# Patient Record
Sex: Female | Born: 1965
Health system: Southern US, Academic
[De-identification: ages and names within clinical notes are randomized; demographics above are authoritative.]

## PROBLEM LIST (undated history)

## (undated) ENCOUNTER — Ambulatory Visit: Payer: MEDICARE | Attending: Gastroenterology | Primary: Gastroenterology

## (undated) ENCOUNTER — Encounter

## (undated) ENCOUNTER — Ambulatory Visit: Attending: Pharmacist | Primary: Pharmacist

## (undated) ENCOUNTER — Telehealth

## (undated) ENCOUNTER — Ambulatory Visit

## (undated) ENCOUNTER — Encounter: Attending: Internal Medicine | Primary: Internal Medicine

## (undated) ENCOUNTER — Encounter: Attending: Gastroenterology | Primary: Gastroenterology

## (undated) ENCOUNTER — Ambulatory Visit: Payer: MEDICARE | Attending: Internal Medicine | Primary: Internal Medicine

## (undated) ENCOUNTER — Telehealth: Attending: Internal Medicine | Primary: Internal Medicine

## (undated) ENCOUNTER — Ambulatory Visit: Attending: Family Medicine | Primary: Family Medicine

## (undated) ENCOUNTER — Ambulatory Visit
Attending: Student in an Organized Health Care Education/Training Program | Primary: Student in an Organized Health Care Education/Training Program

## (undated) ENCOUNTER — Ambulatory Visit: Payer: MEDICARE

## (undated) ENCOUNTER — Ambulatory Visit: Payer: Medicare (Managed Care) | Attending: Gastroenterology | Primary: Gastroenterology

## (undated) DIAGNOSIS — E785 Hyperlipidemia, unspecified: Secondary | ICD-10-CM

## (undated) DIAGNOSIS — E782 Mixed hyperlipidemia: Secondary | ICD-10-CM

## (undated) DIAGNOSIS — I10 Essential (primary) hypertension: Secondary | ICD-10-CM

## (undated) DIAGNOSIS — R001 Bradycardia, unspecified: Secondary | ICD-10-CM

## (undated) DIAGNOSIS — E119 Type 2 diabetes mellitus without complications: Secondary | ICD-10-CM

## (undated) DIAGNOSIS — K5090 Crohn's disease, unspecified, without complications: Secondary | ICD-10-CM

## (undated) DIAGNOSIS — M1990 Unspecified osteoarthritis, unspecified site: Secondary | ICD-10-CM

## (undated) HISTORY — PX: TONSILLECTOMY: SUR1361

## (undated) HISTORY — DX: Unspecified osteoarthritis, unspecified site: M19.90

## (undated) HISTORY — DX: Type 2 diabetes mellitus without complications: E11.9

## (undated) HISTORY — PX: APPENDECTOMY: SHX54

## (undated) HISTORY — DX: Mixed hyperlipidemia: E78.2

## (undated) HISTORY — DX: Hyperlipidemia, unspecified: E78.5

## (undated) HISTORY — DX: Essential (primary) hypertension: I10

## (undated) HISTORY — DX: Bradycardia, unspecified: R00.1

## (undated) HISTORY — PX: SMALL INTESTINE SURGERY: SHX150

## (undated) HISTORY — PX: CATARACT EXTRACTION: SUR2

## (undated) MED ORDER — EZETIMIBE 10 MG TABLET: Freq: Every day | ORAL | 0.00000 days

## (undated) MED ORDER — BIOTIN 1 MG TABLET: Freq: Every day | ORAL | 0 days

---
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
## 1998-02-13 ENCOUNTER — Inpatient Hospital Stay (HOSPITAL_COMMUNITY): Admission: AD | Admit: 1998-02-13 | Discharge: 1998-02-13 | Payer: Self-pay | Admitting: Obstetrics and Gynecology

## 1998-03-14 ENCOUNTER — Inpatient Hospital Stay (HOSPITAL_COMMUNITY): Admission: AD | Admit: 1998-03-14 | Discharge: 1998-03-14 | Payer: Self-pay | Admitting: Obstetrics and Gynecology

## 1998-03-23 ENCOUNTER — Other Ambulatory Visit: Admission: RE | Admit: 1998-03-23 | Discharge: 1998-03-23 | Payer: Self-pay | Admitting: Obstetrics and Gynecology

## 1998-04-11 ENCOUNTER — Inpatient Hospital Stay (HOSPITAL_COMMUNITY): Admission: AD | Admit: 1998-04-11 | Discharge: 1998-04-11 | Payer: Self-pay | Admitting: Obstetrics and Gynecology

## 1998-05-09 ENCOUNTER — Encounter (HOSPITAL_COMMUNITY): Admission: RE | Admit: 1998-05-09 | Discharge: 1998-08-07 | Payer: Self-pay | Admitting: Obstetrics and Gynecology

## 1999-04-17 ENCOUNTER — Other Ambulatory Visit: Admission: RE | Admit: 1999-04-17 | Discharge: 1999-04-17 | Payer: Self-pay | Admitting: Obstetrics and Gynecology

## 2000-04-29 ENCOUNTER — Other Ambulatory Visit: Admission: RE | Admit: 2000-04-29 | Discharge: 2000-04-29 | Payer: Self-pay | Admitting: Obstetrics and Gynecology

## 2000-05-13 HISTORY — PX: ANAL FISSURE REPAIR: SHX2312

## 2001-05-21 ENCOUNTER — Other Ambulatory Visit: Admission: RE | Admit: 2001-05-21 | Discharge: 2001-05-21 | Payer: Self-pay | Admitting: Obstetrics and Gynecology

## 2001-11-24 ENCOUNTER — Other Ambulatory Visit: Admission: RE | Admit: 2001-11-24 | Discharge: 2001-11-24 | Payer: Self-pay | Admitting: Obstetrics and Gynecology

## 2002-06-01 ENCOUNTER — Other Ambulatory Visit: Admission: RE | Admit: 2002-06-01 | Discharge: 2002-06-01 | Payer: Self-pay | Admitting: Obstetrics and Gynecology

## 2003-06-08 ENCOUNTER — Other Ambulatory Visit: Admission: RE | Admit: 2003-06-08 | Discharge: 2003-06-08 | Payer: Self-pay | Admitting: Obstetrics and Gynecology

## 2004-05-17 ENCOUNTER — Other Ambulatory Visit: Admission: RE | Admit: 2004-05-17 | Discharge: 2004-05-17 | Payer: Self-pay | Admitting: Obstetrics and Gynecology

## 2005-05-17 ENCOUNTER — Other Ambulatory Visit: Admission: RE | Admit: 2005-05-17 | Discharge: 2005-05-17 | Payer: Self-pay | Admitting: Obstetrics and Gynecology

## 2011-04-30 ENCOUNTER — Other Ambulatory Visit: Payer: Self-pay | Admitting: Obstetrics and Gynecology

## 2011-04-30 DIAGNOSIS — O26899 Other specified pregnancy related conditions, unspecified trimester: Secondary | ICD-10-CM

## 2011-04-30 DIAGNOSIS — IMO0002 Reserved for concepts with insufficient information to code with codable children: Secondary | ICD-10-CM

## 2011-05-01 ENCOUNTER — Ambulatory Visit
Admission: RE | Admit: 2011-05-01 | Discharge: 2011-05-01 | Disposition: A | Discharge status: Home / Self Care | Payer: BC Managed Care – PPO | Source: Ambulatory Visit | Attending: Obstetrics and Gynecology | Admitting: Obstetrics and Gynecology

## 2011-05-01 DIAGNOSIS — IMO0002 Reserved for concepts with insufficient information to code with codable children: Secondary | ICD-10-CM

## 2011-05-01 DIAGNOSIS — O26899 Other specified pregnancy related conditions, unspecified trimester: Secondary | ICD-10-CM

## 2011-05-01 MED ORDER — IOHEXOL 300 MG/ML  SOLN
125.0000 mL | Freq: Once | INTRAMUSCULAR | Status: AC | PRN
  Administered 2011-05-01: 125 mL via INTRAVENOUS

## 2012-10-13 ENCOUNTER — Encounter (HOSPITAL_COMMUNITY): Payer: Self-pay | Admitting: Pharmacist

## 2012-10-14 ENCOUNTER — Encounter (HOSPITAL_COMMUNITY): Payer: Self-pay | Admitting: *Deleted

## 2012-10-15 ENCOUNTER — Other Ambulatory Visit: Payer: Self-pay | Admitting: Obstetrics and Gynecology

## 2012-10-15 ENCOUNTER — Encounter (HOSPITAL_COMMUNITY)
Admission: RE | Admit: 2012-10-15 | Discharge: 2012-10-15 | Disposition: A | Discharge status: Home / Self Care | Payer: Medicare Other | Source: Ambulatory Visit | Attending: Obstetrics and Gynecology | Admitting: Obstetrics and Gynecology

## 2012-10-15 ENCOUNTER — Encounter (HOSPITAL_COMMUNITY): Payer: Self-pay

## 2012-10-15 ENCOUNTER — Other Ambulatory Visit: Payer: Self-pay

## 2012-10-15 DIAGNOSIS — Z01818 Encounter for other preprocedural examination: Secondary | ICD-10-CM | POA: Insufficient documentation

## 2012-10-15 DIAGNOSIS — Z01812 Encounter for preprocedural laboratory examination: Secondary | ICD-10-CM | POA: Insufficient documentation

## 2012-10-15 LAB — SURGICAL PCR SCREEN
MRSA, PCR: NEGATIVE
Staphylococcus aureus: NEGATIVE

## 2012-10-15 LAB — BASIC METABOLIC PANEL
GFR calc Af Amer: >90 mL/min (ref >90)
GFR calc non Af Amer: >90 mL/min (ref >90)
Glucose, Bld: 131 mg/dL — ABNORMAL HIGH (ref 70–99)
Potassium: 3.7 mEq/L (ref 3.5–5.1)
Sodium: 138 mEq/L (ref 135–145)

## 2012-10-15 LAB — CBC
Hemoglobin: 13.3 g/dL (ref 12.0–15.0)
MCHC: 31.9 g/dL (ref 30.0–36.0)
RDW: 14.4 % (ref 11.5–15.5)
WBC: 7.2 10*3/uL (ref 4.0–10.5)

## 2012-10-15 NOTE — Patient Instructions (Addendum)
Your procedure is scheduled on:10/20/12  Enter through the Main Entrance at :0830 am Pick up desk phone and dial [phone] and inform us of your arrival.  Please call [phone] if you have any problems the morning of surgery.  Remember: Do not eat or drink after midnight: Monday  ( water ok until 6am on Tuesday)   You may brush your teeth the morning of surgery.   DO NOT wear jewelry, eye make-up, lipstick,body lotion, or dark fingernail polish.  (Polished toes are ok)   If you are to be admitted after surgery, leave suitcase in car until your room has been assigned. Patients discharged on the day of surgery will not be allowed to drive home. Wear loose fitting, comfortable clothes for your ride home.

## 2012-10-15 NOTE — Pre-Procedure Instructions (Signed)
Small veins in both hands- pt inst to drink water until 6am on day of surgery. Antecubital veins are sufficient and lab draw was successful on 1st try.

## 2012-10-16 ENCOUNTER — Other Ambulatory Visit: Payer: Self-pay | Admitting: Obstetrics and Gynecology

## 2012-10-19 MED ORDER — DEXTROSE 5 % IV SOLN
INTRAVENOUS | Status: AC
  Administered 2012-10-20: 100 mL via INTRAVENOUS
  Filled 2012-10-19: qty 10

## 2012-10-19 NOTE — H&P (Signed)
Brittany [name] is an 47 y.o. female with a history of menorrhagia, uterine prolapse, dysmenorrhea and pelvic pain. She reports increasing pelvic pressure and pain and reports that her last period was almost intolerable due to pain and bleeding. She now requests that a definitive procedure be carried out to resolve these issues with her menses and prolapse and is being admitted for a total abdominal hysterectomy and BSO. The abdominal approach is being taken to avoid the Crohn's disease which has involved to lower rectovaginal septum. Recent endometrial biopsy revealed benign endometrium with partial polyp. Last pap was within normal limits  Pertinent Gynecological History: Menses: flow is excessive with use of 10 pads or tampons on heaviest days Bleeding: dysfunctional uterine bleeding Last mammogram: normal Date: 09/09/2012 Last pap: normal Date: 09/09/2012    Menstrual History:  Patient's last menstrual period was 09/23/2012.    Past Medical History  Diagnosis Date  . Hypertension   . Crohn's disease     Past Surgical History  Procedure Laterality Date  . Appendectomy    . Tonsillectomy    . Anal fissure repair  2002  . Small intestine surgery      reconstruction    History reviewed. No pertinent family history.  Social History:  reports that she has quit smoking. She does not have any smokeless tobacco history on file. She reports that she does not drink alcohol or use illicit drugs.  Allergies:  Allergies  Allergen Reactions  . Mercaptopurine     Used to treat pt. Crohn's Disease.  Caused Pancreatis 1992.  Marland Kitchen Morphine And Related Itching  . Penicillins Rash    No prescriptions prior to admission    Review of Systems  Constitutional: Negative.  Negative for fever, chills and weight loss.  Respiratory: Negative for cough and hemoptysis.   Cardiovascular: Negative for chest pain, palpitations and orthopnea.  Gastrointestinal: Positive for abdominal pain. Negative for  heartburn, nausea and vomiting.  Genitourinary: Negative for dysuria, urgency, frequency and hematuria.  Gyn: No discharge or itch. Positive pressure, last menses was extremely heavy with large clots. Last menstrual period 09/23/2012.   Physical Exam  Constitutional: She is oriented to person, place, and time. She appears well-developed and well-nourished.  HENT:  Head: Normocephalic and atraumatic.  Eyes: Conjunctivae are normal. Pupils are equal, round, and reactive to light.  Neck: Normal range of motion. Neck supple. No tracheal deviation present. No thyromegaly present.  Cardiovascular: Normal rate, regular rhythm and normal heart sounds.  Exam reveals no gallop.   No murmur heard. Respiratory: Effort normal and breath sounds normal.  GI: Bowel sounds are normal. She exhibits no distension and no mass. There is no tenderness. There is no rebound and no guarding.  Neurological: She is alert and oriented to person, place, and time.  Skin: Skin is warm and dry.  Psychiatric: She has a normal mood and affect. Her behavior is normal. Thought content normal.   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 Remainder of vagina is normal and without lesion   Cervix: No tender and without lesion   Uterus: Anterior, normal size and shape, mobile   Adnexa: No masses noted, non tender  No results found for this or any previous visit (from the past 24 hour(s)).  No results found.  Assessment: Menorrhagia, dysmenorrhea,  pelvic pain, endometrial polyp in patient with Crohn's disease previously noted in lower rectovaginal septum  Plan: Total abdominal hysterectomy and bilateral salpingo oophorectomy. Abdominal approach being taken due to known Crohn's disease in rectovaginal septum. Patient has been bowel  prepped should any bowel resection be needed due to her history and location of Crohn's disease. The risks and benefits have been discussed and informed consent has been obtained   Leolia Vinzant 10/19/2012, 1:22 PM

## 2012-10-20 ENCOUNTER — Inpatient Hospital Stay (HOSPITAL_COMMUNITY)
Admission: RE | Admit: 2012-10-20 | Discharge: 2012-10-22 | DRG: 742 | Disposition: A | Discharge status: Home / Self Care | Payer: Medicare Other | Source: Ambulatory Visit | Attending: Obstetrics and Gynecology | Admitting: Obstetrics and Gynecology

## 2012-10-20 ENCOUNTER — Encounter (HOSPITAL_COMMUNITY): Payer: Self-pay | Admitting: Anesthesiology

## 2012-10-20 ENCOUNTER — Encounter (HOSPITAL_COMMUNITY)
Admission: RE | Disposition: A | Discharge status: Home / Self Care | Payer: Self-pay | Source: Ambulatory Visit | Attending: Obstetrics and Gynecology

## 2012-10-20 ENCOUNTER — Inpatient Hospital Stay (HOSPITAL_COMMUNITY): Payer: Medicare Other | Admitting: Anesthesiology

## 2012-10-20 DIAGNOSIS — N946 Dysmenorrhea, unspecified: Secondary | ICD-10-CM | POA: Diagnosis present

## 2012-10-20 DIAGNOSIS — N8000 Endometriosis of the uterus, unspecified: Secondary | ICD-10-CM | POA: Diagnosis present

## 2012-10-20 DIAGNOSIS — N840 Polyp of corpus uteri: Secondary | ICD-10-CM | POA: Diagnosis present

## 2012-10-20 DIAGNOSIS — N831 Corpus luteum cyst of ovary, unspecified side: Secondary | ICD-10-CM | POA: Diagnosis present

## 2012-10-20 DIAGNOSIS — N838 Other noninflammatory disorders of ovary, fallopian tube and broad ligament: Secondary | ICD-10-CM | POA: Diagnosis present

## 2012-10-20 DIAGNOSIS — N949 Unspecified condition associated with female genital organs and menstrual cycle: Secondary | ICD-10-CM | POA: Diagnosis present

## 2012-10-20 DIAGNOSIS — N920 Excessive and frequent menstruation with regular cycle: Principal | ICD-10-CM | POA: Diagnosis present

## 2012-10-20 DIAGNOSIS — K5090 Crohn's disease, unspecified, without complications: Secondary | ICD-10-CM | POA: Diagnosis present

## 2012-10-20 HISTORY — DX: Essential (primary) hypertension: I10

## 2012-10-20 HISTORY — PX: SALPINGOOPHORECTOMY: SHX82

## 2012-10-20 HISTORY — PX: ABDOMINAL HYSTERECTOMY: SHX81

## 2012-10-20 HISTORY — DX: Crohn's disease, unspecified, without complications: K50.90

## 2012-10-20 LAB — BASIC METABOLIC PANEL
BUN: 9 mg/dL (ref 6–23)
CO2: 27 mEq/L (ref 19–32)
Calcium: 9.6 mg/dL (ref 8.4–10.5)
GFR calc non Af Amer: >90 mL/min (ref >90)
Glucose, Bld: 99 mg/dL (ref 70–99)
Potassium: 3.7 mEq/L (ref 3.5–5.1)

## 2012-10-20 SURGERY — HYSTERECTOMY, ABDOMINAL
Anesthesia: Epidural | Site: Abdomen | Wound class: Clean

## 2012-10-20 MED ORDER — PROPOFOL 10 MG/ML IV BOLUS
INTRAVENOUS | Status: DC | PRN
  Administered 2012-10-20: 170 mg via INTRAVENOUS
  Administered 2012-10-20: 220 mg via INTRAVENOUS

## 2012-10-20 MED ORDER — LACTATED RINGERS IV SOLN
INTRAVENOUS | Status: DC
  Administered 2012-10-20 – 2012-10-21 (×4): via INTRAVENOUS

## 2012-10-20 MED ORDER — SODIUM BICARBONATE 8.4 % IV SOLN
INTRAVENOUS | Status: AC
  Filled 2012-10-20: qty 50

## 2012-10-20 MED ORDER — SCOPOLAMINE 1 MG/3DAYS TD PT72
1.0000 | MEDICATED_PATCH | Freq: Once | TRANSDERMAL | Status: DC
  Filled 2012-10-20 (×2): qty 1

## 2012-10-20 MED ORDER — MEPERIDINE HCL 25 MG/ML IJ SOLN: 6.2500 mg | INTRAMUSCULAR | Status: DC | PRN

## 2012-10-20 MED ORDER — MIDAZOLAM HCL 2 MG/2ML IJ SOLN
INTRAMUSCULAR | Status: AC
  Filled 2012-10-20: qty 2

## 2012-10-20 MED ORDER — ROCURONIUM BROMIDE 50 MG/5ML IV SOLN
INTRAVENOUS | Status: AC
  Filled 2012-10-20: qty 1

## 2012-10-20 MED ORDER — METOCLOPRAMIDE HCL 5 MG/ML IJ SOLN: 10.0000 mg | Freq: Three times a day (TID) | INTRAMUSCULAR | Status: DC | PRN

## 2012-10-20 MED ORDER — PROMETHAZINE HCL 25 MG/ML IJ SOLN
INTRAMUSCULAR | Status: AC
  Administered 2012-10-20: 12.5 mg via INTRAVENOUS
  Filled 2012-10-20: qty 1

## 2012-10-20 MED ORDER — ACETAMINOPHEN 10 MG/ML IV SOLN
1000.0000 mg | Freq: Four times a day (QID) | INTRAVENOUS | Status: AC | PRN
  Filled 2012-10-20: qty 100

## 2012-10-20 MED ORDER — MIDAZOLAM HCL 2 MG/2ML IJ SOLN: 0.5000 mg | Freq: Once | INTRAMUSCULAR | Status: DC | PRN

## 2012-10-20 MED ORDER — KETOROLAC TROMETHAMINE 30 MG/ML IJ SOLN: 15.0000 mg | Freq: Once | INTRAMUSCULAR | Status: AC | PRN

## 2012-10-20 MED ORDER — LACTATED RINGERS IV BOLUS (SEPSIS): 500.0000 mL | Freq: Once | INTRAVENOUS | Status: DC

## 2012-10-20 MED ORDER — NEOSTIGMINE METHYLSULFATE 1 MG/ML IJ SOLN
INTRAMUSCULAR | Status: DC | PRN
  Administered 2012-10-20: 4 mg via INTRAVENOUS

## 2012-10-20 MED ORDER — NALOXONE HCL 0.4 MG/ML IJ SOLN: 0.4000 mg | INTRAMUSCULAR | Status: DC | PRN

## 2012-10-20 MED ORDER — HYDROMORPHONE 0.3 MG/ML IV SOLN
INTRAVENOUS | Status: DC
  Administered 2012-10-20: 3 mg via INTRAVENOUS
  Administered 2012-10-20: 16:00:00 via INTRAVENOUS
  Administered 2012-10-21: 2.1 mg via INTRAVENOUS
  Administered 2012-10-21: 10:00:00 via INTRAVENOUS
  Administered 2012-10-21: 4.9 mL via INTRAVENOUS
  Administered 2012-10-21: 0.3 mg via INTRAVENOUS
  Filled 2012-10-20 (×2): qty 25

## 2012-10-20 MED ORDER — KETOROLAC TROMETHAMINE 30 MG/ML IJ SOLN
INTRAMUSCULAR | Status: AC
  Administered 2012-10-20: 30 mg via INTRAVENOUS
  Filled 2012-10-20: qty 1

## 2012-10-20 MED ORDER — ROCURONIUM BROMIDE 100 MG/10ML IV SOLN
INTRAVENOUS | Status: DC | PRN
  Administered 2012-10-20: 30 mg via INTRAVENOUS
  Administered 2012-10-20: 10 mg via INTRAVENOUS

## 2012-10-20 MED ORDER — EPHEDRINE SULFATE 50 MG/ML IJ SOLN
INTRAMUSCULAR | Status: AC
  Administered 2012-10-20: 10 mg
  Filled 2012-10-20: qty 1

## 2012-10-20 MED ORDER — PROMETHAZINE HCL 25 MG/ML IJ SOLN
12.5000 mg | Freq: Four times a day (QID) | INTRAMUSCULAR | Status: DC | PRN
  Filled 2012-10-20: qty 1

## 2012-10-20 MED ORDER — KETOROLAC TROMETHAMINE 30 MG/ML IJ SOLN
30.0000 mg | Freq: Four times a day (QID) | INTRAMUSCULAR | Status: DC | PRN
  Administered 2012-10-20 – 2012-10-21 (×2): 30 mg via INTRAVENOUS
  Filled 2012-10-20 (×2): qty 1

## 2012-10-20 MED ORDER — 0.9 % SODIUM CHLORIDE (POUR BTL) OPTIME
TOPICAL | Status: DC | PRN
  Administered 2012-10-20 (×3): 1000 mL

## 2012-10-20 MED ORDER — EPHEDRINE SULFATE 50 MG/ML IJ SOLN: 5.0000 mg | Freq: Once | INTRAMUSCULAR | Status: DC

## 2012-10-20 MED ORDER — PROPOFOL 10 MG/ML IV EMUL
INTRAVENOUS | Status: AC
  Filled 2012-10-20: qty 20

## 2012-10-20 MED ORDER — LIDOCAINE-EPINEPHRINE (PF) 2 %-1:200000 IJ SOLN
INTRAMUSCULAR | Status: AC
  Filled 2012-10-20: qty 20

## 2012-10-20 MED ORDER — ETOMIDATE 2 MG/ML IV SOLN
INTRAVENOUS | Status: AC
  Filled 2012-10-20: qty 10

## 2012-10-20 MED ORDER — LIDOCAINE HCL (CARDIAC) 20 MG/ML IV SOLN
INTRAVENOUS | Status: AC
  Filled 2012-10-20: qty 5

## 2012-10-20 MED ORDER — DIPHENHYDRAMINE HCL 12.5 MG/5ML PO ELIX: 12.5000 mg | ORAL_SOLUTION | Freq: Four times a day (QID) | ORAL | Status: DC | PRN

## 2012-10-20 MED ORDER — FENTANYL CITRATE 0.05 MG/ML IJ SOLN
INTRAMUSCULAR | Status: AC
  Filled 2012-10-20: qty 5

## 2012-10-20 MED ORDER — SODIUM CHLORIDE 0.9 % IJ SOLN: 9.0000 mL | INTRAMUSCULAR | Status: DC | PRN

## 2012-10-20 MED ORDER — PROMETHAZINE HCL 25 MG/ML IJ SOLN: 6.2500 mg | INTRAMUSCULAR | Status: DC | PRN

## 2012-10-20 MED ORDER — ONDANSETRON HCL 4 MG/2ML IJ SOLN
INTRAMUSCULAR | Status: AC
  Filled 2012-10-20: qty 2

## 2012-10-20 MED ORDER — PHENYLEPHRINE 40 MCG/ML (10ML) SYRINGE FOR IV PUSH (FOR BLOOD PRESSURE SUPPORT)
PREFILLED_SYRINGE | INTRAVENOUS | Status: AC
  Filled 2012-10-20: qty 5

## 2012-10-20 MED ORDER — FENTANYL CITRATE 0.05 MG/ML IJ SOLN
INTRAMUSCULAR | Status: AC
  Administered 2012-10-20: 50 ug via INTRAVENOUS
  Filled 2012-10-20: qty 2

## 2012-10-20 MED ORDER — MIDAZOLAM HCL 5 MG/5ML IJ SOLN
INTRAMUSCULAR | Status: DC | PRN
  Administered 2012-10-20 (×4): 1 mg via INTRAVENOUS

## 2012-10-20 MED ORDER — KETOROLAC TROMETHAMINE 30 MG/ML IJ SOLN: 30.0000 mg | Freq: Four times a day (QID) | INTRAMUSCULAR | Status: DC | PRN

## 2012-10-20 MED ORDER — SODIUM CHLORIDE 0.9 % IV SOLN
INTRAVENOUS | Status: DC
  Filled 2012-10-20 (×6): qty 20

## 2012-10-20 MED ORDER — DIPHENHYDRAMINE HCL 25 MG PO CAPS: 25.0000 mg | ORAL_CAPSULE | ORAL | Status: DC | PRN

## 2012-10-20 MED ORDER — DIPHENHYDRAMINE HCL 50 MG/ML IJ SOLN: 12.5000 mg | INTRAMUSCULAR | Status: DC | PRN

## 2012-10-20 MED ORDER — ONDANSETRON HCL 4 MG/2ML IJ SOLN: 4.0000 mg | Freq: Four times a day (QID) | INTRAMUSCULAR | Status: DC | PRN

## 2012-10-20 MED ORDER — SUCCINYLCHOLINE CHLORIDE 20 MG/ML IJ SOLN
INTRAMUSCULAR | Status: DC | PRN
  Administered 2012-10-20: 120 mg via INTRAVENOUS

## 2012-10-20 MED ORDER — HYDROMORPHONE HCL PF 1 MG/ML IJ SOLN: 1.0000 mg | Freq: Once | INTRAMUSCULAR | Status: AC

## 2012-10-20 MED ORDER — ONDANSETRON HCL 4 MG/2ML IJ SOLN: 4.0000 mg | Freq: Three times a day (TID) | INTRAMUSCULAR | Status: DC | PRN

## 2012-10-20 MED ORDER — SODIUM CHLORIDE 0.9 % IV SOLN
Freq: Once | INTRAVENOUS | Status: DC
  Filled 2012-10-20 (×2): qty 20

## 2012-10-20 MED ORDER — ONDANSETRON HCL 4 MG/2ML IJ SOLN
INTRAMUSCULAR | Status: DC | PRN
  Administered 2012-10-20: 4 mg via INTRAVENOUS

## 2012-10-20 MED ORDER — NALBUPHINE SYRINGE 5 MG/0.5 ML
5.0000 mg | INJECTION | INTRAMUSCULAR | Status: DC | PRN
  Filled 2012-10-20: qty 1

## 2012-10-20 MED ORDER — LIDOCAINE HCL (CARDIAC) 20 MG/ML IV SOLN
INTRAVENOUS | Status: DC | PRN
  Administered 2012-10-20 (×2): 30 mg via INTRAVENOUS
  Administered 2012-10-20: 40 mg via INTRAVENOUS

## 2012-10-20 MED ORDER — DIPHENHYDRAMINE HCL 50 MG/ML IJ SOLN: 25.0000 mg | INTRAMUSCULAR | Status: DC | PRN

## 2012-10-20 MED ORDER — NALOXONE HCL 1 MG/ML IJ SOLN
1.0000 ug/kg/h | INTRAMUSCULAR | Status: DC | PRN
  Filled 2012-10-20: qty 2

## 2012-10-20 MED ORDER — GLYCOPYRROLATE 0.2 MG/ML IJ SOLN
INTRAMUSCULAR | Status: AC
  Filled 2012-10-20: qty 2

## 2012-10-20 MED ORDER — HYDROMORPHONE HCL PF 1 MG/ML IJ SOLN
INTRAMUSCULAR | Status: AC
  Administered 2012-10-20: 0.5 mg via INTRAVENOUS
  Filled 2012-10-20: qty 1

## 2012-10-20 MED ORDER — PHENYLEPHRINE HCL 10 MG/ML IJ SOLN
INTRAMUSCULAR | Status: DC | PRN
  Administered 2012-10-20 (×2): 80 ug via INTRAVENOUS
  Administered 2012-10-20: 40 ug via INTRAVENOUS
  Administered 2012-10-20 (×5): 80 ug via INTRAVENOUS

## 2012-10-20 MED ORDER — FENTANYL CITRATE 0.05 MG/ML IJ SOLN
INTRAMUSCULAR | Status: AC
Start: 2012-10-20 — End: 2012-10-20
  Filled 2012-10-20: qty 2

## 2012-10-20 MED ORDER — GLYCOPYRROLATE 0.2 MG/ML IJ SOLN
INTRAMUSCULAR | Status: DC | PRN
  Administered 2012-10-20: 0.6 mg via INTRAVENOUS

## 2012-10-20 MED ORDER — DIPHENHYDRAMINE HCL 50 MG/ML IJ SOLN: 12.5000 mg | Freq: Four times a day (QID) | INTRAMUSCULAR | Status: DC | PRN

## 2012-10-20 MED ORDER — FENTANYL CITRATE 0.05 MG/ML IJ SOLN
25.0000 ug | INTRAMUSCULAR | Status: DC | PRN
  Administered 2012-10-20: 50 ug via INTRAVENOUS
  Administered 2012-10-20 (×2): 25 ug via INTRAVENOUS

## 2012-10-20 MED ORDER — LIDOCAINE-EPINEPHRINE (PF) 2 %-1:200000 IJ SOLN
INTRAMUSCULAR | Status: DC | PRN
  Administered 2012-10-20: 3 mL via EPIDURAL

## 2012-10-20 MED ORDER — IBUPROFEN 600 MG PO TABS
600.0000 mg | ORAL_TABLET | Freq: Four times a day (QID) | ORAL | Status: DC | PRN
  Administered 2012-10-22: 600 mg via ORAL
  Filled 2012-10-20: qty 1

## 2012-10-20 MED ORDER — MENTHOL 3 MG MT LOZG: 1.0000 | LOZENGE | OROMUCOSAL | Status: DC | PRN

## 2012-10-20 MED ORDER — BUPIVACAINE-EPINEPHRINE PF 0.25-1:200000 % IJ SOLN
INTRAMUSCULAR | Status: AC
  Filled 2012-10-20: qty 30

## 2012-10-20 MED ORDER — LIDOCAINE HCL (PF) 1 % IJ SOLN
INTRAMUSCULAR | Status: DC | PRN
  Administered 2012-10-20: 8 mL

## 2012-10-20 MED ORDER — LACTATED RINGERS IV SOLN
INTRAVENOUS | Status: DC
  Administered 2012-10-20 (×3): via INTRAVENOUS

## 2012-10-20 MED ORDER — SODIUM CHLORIDE 0.9 % IJ SOLN
3.0000 mL | INTRAMUSCULAR | Status: DC | PRN
  Administered 2012-10-21: 3 mL via INTRAVENOUS

## 2012-10-20 MED ORDER — FENTANYL CITRATE 0.05 MG/ML IJ SOLN
INTRAMUSCULAR | Status: DC | PRN
  Administered 2012-10-20: 100 ug via INTRAVENOUS

## 2012-10-20 SURGICAL SUPPLY — 39 items
APL SKNCLS STERI-STRIP NONHPOA (GAUZE/BANDAGES/DRESSINGS)
BENZOIN TINCTURE PRP APPL 2/3 (GAUZE/BANDAGES/DRESSINGS) IMPLANT
CANISTER SUCTION 2500CC (MISCELLANEOUS) ×3 IMPLANT
CLOTH BEACON ORANGE TIMEOUT ST (SAFETY) ×3 IMPLANT
CONT PATH 16OZ SNAP LID 3702 (MISCELLANEOUS) ×3 IMPLANT
DECANTER SPIKE VIAL GLASS SM (MISCELLANEOUS) IMPLANT
DRAPE LAPAROTOMY T 102X78X121 (DRAPES) ×3 IMPLANT
DRSG OPSITE POSTOP 4X10 (GAUZE/BANDAGES/DRESSINGS) ×3 IMPLANT
GLOVE BIO SURGEON STRL SZ7.5 (GLOVE) ×3 IMPLANT
GLOVE BIOGEL PI IND STRL 7.0 (GLOVE) ×2 IMPLANT
GLOVE BIOGEL PI IND STRL 7.5 (GLOVE) ×2 IMPLANT
GLOVE BIOGEL PI INDICATOR 7.0 (GLOVE) ×1
GLOVE BIOGEL PI INDICATOR 7.5 (GLOVE) ×1
GLOVE ECLIPSE 6.5 STRL STRAW (GLOVE) ×3 IMPLANT
GLOVE ECLIPSE 7.5 STRL STRAW (GLOVE) ×3 IMPLANT
GOWN PREVENTION PLUS LG XLONG (DISPOSABLE) ×6 IMPLANT
GOWN PREVENTION PLUS XXLARGE (GOWN DISPOSABLE) ×3 IMPLANT
NS IRRIG 1000ML POUR BTL (IV SOLUTION) ×3 IMPLANT
PACK ABDOMINAL GYN (CUSTOM PROCEDURE TRAY) ×3 IMPLANT
PAD OB MATERNITY 4.3X12.25 (PERSONAL CARE ITEMS) ×3 IMPLANT
PROTECTOR NERVE ULNAR (MISCELLANEOUS) ×3 IMPLANT
SPONGE LAP 18X18 X RAY DECT (DISPOSABLE) ×3 IMPLANT
STAPLER VISISTAT 35W (STAPLE) ×3 IMPLANT
STRIP CLOSURE SKIN 1/2X4 (GAUZE/BANDAGES/DRESSINGS) IMPLANT
SUT PDS AB 0 CTX 60 (SUTURE) ×6 IMPLANT
SUT PLAIN 2 0 XLH (SUTURE) ×3 IMPLANT
SUT VIC AB 0 CT1 18XCR BRD8 (SUTURE) ×4 IMPLANT
SUT VIC AB 0 CT1 27 (SUTURE) ×6
SUT VIC AB 0 CT1 27XBRD ANBCTR (SUTURE) ×4 IMPLANT
SUT VIC AB 0 CT1 8-18 (SUTURE) ×6
SUT VIC AB 2-0 CT1 27 (SUTURE) ×3
SUT VIC AB 2-0 CT1 TAPERPNT 27 (SUTURE) ×2 IMPLANT
SUT VIC AB 2-0 SH 27 (SUTURE) ×3
SUT VIC AB 2-0 SH 27XBRD (SUTURE) ×2 IMPLANT
SUT VIC AB 4-0 PS2 27 (SUTURE) IMPLANT
SUT VICRYL 0 TIES 12 18 (SUTURE) ×3 IMPLANT
TOWEL OR 17X24 6PK STRL BLUE (TOWEL DISPOSABLE) ×6 IMPLANT
TRAY FOLEY CATH 14FR (SET/KITS/TRAYS/PACK) ×3 IMPLANT
WATER STERILE IRR 1000ML POUR (IV SOLUTION) IMPLANT

## 2012-10-20 NOTE — H&P (Signed)
  Status unchanged. Will proceed with planned TAH BSO

## 2012-10-20 NOTE — Anesthesia Postprocedure Evaluation (Signed)
  Anesthesia Post Note  Patient: Brittany [name]  Procedure(s) Performed: Procedure(s) (LRB): HYSTERECTOMY ABDOMINAL (N/A) SALPINGO OOPHORECTOMY (Bilateral)  Anesthesia type: GA and epidural  Patient location: PACU  Post pain: Pain level controlled  Post assessment: Post-op Vital signs reviewed  Last Vitals:  Filed Vitals:   10/20/12 1316  BP:   Pulse: 35  Temp:   Resp: 16    Post vital signs: Reviewed  Level of consciousness: sedated  Complications: No apparent anesthesia complications

## 2012-10-20 NOTE — Anesthesia Preprocedure Evaluation (Addendum)
Anesthesia Evaluation  Patient identified by MRN, date of birth, ID band Patient awake    Reviewed: Allergy & Precautions, H&P , Patient's Chart, lab work & pertinent test results, reviewed documented beta blocker date and time   History of Anesthesia Complications Negative for: history of anesthetic complications  Airway Mallampati: III TM Distance: >3 FB Neck ROM: full    Dental no notable dental hx.    Pulmonary neg pulmonary ROS,  breath sounds clear to auscultation  Pulmonary exam normal       Cardiovascular Exercise Tolerance: Good hypertension, negative cardio ROS  Rhythm:regular Rate:Normal     Neuro/Psych negative neurological ROS  negative psych ROS   GI/Hepatic negative GI ROS, Neg liver ROS,   Endo/Other  negative endocrine ROSMorbid obesity  Renal/GU negative Renal ROS     Musculoskeletal   Abdominal   Peds  Hematology negative hematology ROS (+)   Anesthesia Other Findings Hypertension     Crohn's disease   Reproductive/Obstetrics negative OB ROS                           Anesthesia Physical Anesthesia Plan  ASA: III  Anesthesia Plan: Epidural   Post-op Pain Management:    Induction:   Airway Management Planned:   Additional Equipment:   Intra-op Plan:   Post-operative Plan:   Informed Consent: I have reviewed the patients History and Physical, chart, labs and discussed the procedure including the risks, benefits and alternatives for the proposed anesthesia with the patient or authorized representative who has indicated his/her understanding and acceptance.   Dental Advisory Given  Plan Discussed with: CRNA and Surgeon  Anesthesia Plan Comments:        Anesthesia Quick Evaluation Will try to perform case with sedation... May need GA.  Patient understands.

## 2012-10-20 NOTE — Brief Op Note (Signed)
10/20/2012  12:15 PM  PATIENT:  Brittany [name]  46 y.o. female  PRE-OPERATIVE DIAGNOSIS:  DYSFUNCTIONAL UTERINE BLEEDING 16109  POST-OPERATIVE DIAGNOSIS:  DYSFUNCTIONAL UTERINE BLEEDING  PROCEDURE:  Procedure(s): HYSTERECTOMY ABDOMINAL (N/A) SALPINGO OOPHORECTOMY (Bilateral) Lysis of intra loop bowel adhesions  SURGEON:  Surgeon(s) and Role:    * Miguel Aschoff, MD - Primary    * W Lodema Hong, MD - Assisting  PHYSICIAN ASSISTANT:    ANESTHESIA:   epidural  And general  EBL:  Total I/O In: 2000 [I.V.:2000] Out: 225 [Urine:75; Blood:150]  BLOOD ADMINISTERED:none  DRAINS: Urinary Catheter (Foley)   LOCAL MEDICATIONS USED:  NONE  SPECIMEN:  Source of Specimen:  uterus cervix tubes and ovaries  DISPOSITION OF SPECIMEN:  PATHOLOGY  COUNTS:  YES  TOURNIQUET:  * No tourniquets in log *  DICTATION: .Other Dictation: Dictation Number [id number]  PLAN OF CARE: Admit to inpatient   PATIENT DISPOSITION:  PACU - hemodynamically stable.   Delay start of Pharmacological VTE agent (>24hrs) due to surgical blood loss or risk of bleeding: not applicable patient has on SCD devices

## 2012-10-20 NOTE — Transfer of Care (Signed)
Immediate Anesthesia Transfer of Care Note  Patient: Brittany [name]  Procedure(s) Performed: Procedure(s): HYSTERECTOMY ABDOMINAL (N/A) SALPINGO OOPHORECTOMY (Bilateral)  Patient Location: PACU  Anesthesia Type:GA combined with regional for post-op pain  Level of Consciousness: awake, oriented and patient cooperative  Airway & Oxygen Therapy: Patient Spontanous Breathing and Patient connected to nasal cannula oxygen  Post-op Assessment: Report given to PACU RN and Post -op Vital signs reviewed and stable  Post vital signs: Reviewed and stable  Complications: No apparent anesthesia complications

## 2012-10-20 NOTE — Anesthesia Procedure Notes (Signed)
Epidural Patient location during procedure: OR Start time: 10/20/2012 10:37 AM  Staffing Anesthesiologist: Angus Seller., Harrell Gave. Performed by: anesthesiologist   Preanesthetic Checklist Completed: patient identified, site marked, surgical consent, pre-op evaluation, timeout performed, IV checked, risks and benefits discussed and monitors and equipment checked  Epidural Patient position: sitting Prep: site prepped and draped and DuraPrep Patient monitoring: continuous pulse ox and blood pressure Approach: midline Injection technique: LOR air and LOR saline  Needle:  Needle type: Tuohy  Needle gauge: 17 G Needle length: 9 cm and 9 Needle insertion depth: 8 cm Catheter type: closed end flexible Catheter size: 19 Gauge Catheter at skin depth: 14 cm Test dose: negative  Assessment Events: blood not aspirated, injection not painful, no injection resistance, negative IV test and no paresthesia  Additional Notes Patient identified.  Risk benefits discussed including failed block, incomplete pain control, headache, nerve damage, paralysis, blood pressure changes, nausea, vomiting, reactions to medication both toxic or allergic, and postpartum back pain.  Patient expressed understanding and wished to proceed.  All questions were answered.  Sterile technique used throughout procedure and epidural site dressed with sterile barrier dressing. No paresthesia or other complications noted.The patient did not experience any signs of intravascular injection such as tinnitus or metallic taste in mouth nor signs of intrathecal spread such as rapid motor block. Please see nursing notes for vital signs.

## 2012-10-21 ENCOUNTER — Encounter (HOSPITAL_COMMUNITY): Payer: Self-pay | Admitting: Obstetrics and Gynecology

## 2012-10-21 LAB — CBC
HCT: 34.1 % — ABNORMAL LOW (ref 36.0–46.0)
Hemoglobin: 10.6 g/dL — ABNORMAL LOW (ref 12.0–15.0)
MCH: 28.4 pg (ref 26.0–34.0)
MCHC: 31.1 g/dL (ref 30.0–36.0)
MCV: 91.4 fL (ref 78.0–100.0)
Platelets: 169 K/uL (ref 150–400)
RBC: 3.73 MIL/uL — ABNORMAL LOW (ref 3.87–5.11)
RDW: 14.5 % (ref 11.5–15.5)
WBC: 7.2 K/uL (ref 4.0–10.5)

## 2012-10-21 MED ORDER — ALPRAZOLAM 0.5 MG PO TABS
0.5000 mg | ORAL_TABLET | Freq: Three times a day (TID) | ORAL | Status: DC | PRN
  Administered 2012-10-21 – 2012-10-22 (×3): 0.5 mg via ORAL
  Filled 2012-10-21 (×3): qty 1

## 2012-10-21 MED ORDER — KETOROLAC TROMETHAMINE 30 MG/ML IJ SOLN
30.0000 mg | Freq: Four times a day (QID) | INTRAMUSCULAR | Status: DC | PRN
  Administered 2012-10-21: 30 mg via INTRAVENOUS
  Filled 2012-10-21: qty 1

## 2012-10-21 MED ORDER — OXYCODONE-ACETAMINOPHEN 5-325 MG PO TABS
1.0000 | ORAL_TABLET | ORAL | Status: DC | PRN
  Administered 2012-10-21 (×2): 1 via ORAL
  Administered 2012-10-22: 2 via ORAL
  Administered 2012-10-22: 1 via ORAL
  Administered 2012-10-22: 2 via ORAL
  Filled 2012-10-21: qty 2
  Filled 2012-10-21 (×2): qty 1
  Filled 2012-10-21: qty 2
  Filled 2012-10-21: qty 1

## 2012-10-21 NOTE — Progress Notes (Signed)
Ur chart review completed.  

## 2012-10-21 NOTE — Progress Notes (Signed)
/  Patient ID: Brittany [name], female   DOB: 08/24/1965, 47 y.o.   MRN: [id number]   POD #1  Doing well. Stable over night. Reports "stomach rumbling" this AM but has not passed any gas yet  O: Afebrile  BP 95/61  Pulse 63  Abdomen is soft wound is clean and dry.  Hgb 10.6  WBC 7.2  Impression: Stable post op  Plan D/?C foley Ambulate Full liquid diet

## 2012-10-21 NOTE — Anesthesia Postprocedure Evaluation (Signed)
Anesthesia Post Note  Patient: Brittany [name]  Procedure(s) Performed: Procedure(s) (LRB): HYSTERECTOMY ABDOMINAL (N/A) SALPINGO OOPHORECTOMY (Bilateral)  Anesthesia type: General  Patient location: Mother/Baby  Post pain: Pain level controlled  Post assessment: Post-op Vital signs reviewed  Last Vitals:  Filed Vitals:   10/21/12 1412  BP:   Pulse:   Temp:   Resp: 14    Post vital signs: Reviewed  Level of consciousness: awake and alert   Complications: No apparent anesthesia complications

## 2012-10-21 NOTE — Op Note (Signed)
NAMEMARGET, OUTTEN NO.:  [id number]  MEDICAL RECORD NO.:  [id number]  LOCATION:  9305                          FACILITY:  [facility]  PHYSICIAN:  Miguel Aschoff, M.D.       DATE OF BIRTH:  1966/03/12  DATE OF PROCEDURE:  10/20/2012 DATE OF DISCHARGE:                              OPERATIVE REPORT   PREOPERATIVE DIAGNOSIS:  Chronic pelvic pain, menorrhagia, dysmenorrhea.  POSTOPERATIVE DIAGNOSIS:  Chronic pelvic pain, menorrhagia, dysmenorrhea.  PROCEDURE:  Total abdominal hysterectomy, bilateral salpingo- oophorectomy, lysis of adhesions.  SURGEON:  Miguel Aschoff, M.D.  ASSISTANT:  Lodema Hong, MD.  ANESTHESIA:  General.  COMPLICATIONS:  None.  JUSTIFICATION:  The patient is a 47 year old, white female, who has had very heavy vaginal bleeding, cramping, and pelvic pressure.  Because of these symptoms and desired to have definitive procedure carried out, she is being taken to the operating room at this time to undergo total abdominal hysterectomy.  Hence by the fact that the patient also complained of symptoms of uterine descensus with the cervix presenting at the introitus.  The vaginal hysterectomy felt to be contraindicated in this patient who has Crohn disease and a prior rectovaginal fistula with induration of the posterior vaginal wall.  Because of this procedure is being carried abdominally cause with the anticipation that she may have adhesions secondary to prior bowel resections from Crohn disease.  Risks and benefits of the procedure discussed with the patient and informed consent has been obtained.  DESCRIPTION OF PROCEDURE:  The patient was taken to the operating room, placed in the supine position.  General anesthesia was administered without difficulty.  The patient prior to the general anesthetic, however, did have an epidural placed for postoperative pain control. She was prepped and draped in usual sterile fashion.  Foley catheter was inserted.   Once this was done, her previous midline incision was incised from the umbilicus to the symphysis pubis.  The incision extended down through the subcutaneous tissue with bleeding points being clamped and coagulated as they were encountered.  The fascia was then identified by incised vertically was then separated from the underlying rectus muscles.  Rectus muscles were separated and at this point it was apparent that there were adhesions of the omentum to the anterior abdominal wall.  It was possible to enter the abdominal cavity, and remove the adhesions that were obstructing the path to the pelvis.  This was done with care to avoid any injury to the bowel.  This was done successfully and the adhesions were moved up the abdominal wall.  The remainder of the adhesions that involved the pelvis were taken down by sharp dissection again with care to avoid any injury to the bowel.  Once the adhesions were lysed and the anatomy restored in the pelvis to more normal configuration, these were packed out of the field and a self- retaining retractor was placed.  At this point, the angles of the uterus were grasped with long Kelly clamps.  The round ligaments were then identified, suture ligated, and cut.  The bladder flap was then created anteriorly and taken down across the cervix.  Perforations were made below the utero-ovarian  ligaments, and once this was done, the curved Heaney clamps were used to clamp the infundibulopelvic ligaments were care to avoid any injury to the ureters.  The ligaments were then doubly ligated using ligatures of 0 Vicryl.  The broad ligament was then skeletonized.  Uterine vessels found, clamped with curved Heaney clamps and these pedicles were cut and suture ligated using suture ligatures of 0 Vicryl.  Then, using straight Heaney clamps, the paracervical fascia was clamped, cut, and suture ligated in similar fashion.  On the posterior surface of the uterus, there were  some adhesions of the rectosigmoid just below the uterosacral ligaments.  These were taken down carefully and did not cause any injury to the bowel.  At the lateral exposure, however, the uterosacral ligaments and posterior portion of the cervix, these ligaments were then clamped with curved Heaney clamps.  The pedicles cut and suture ligated using suture ligatures of 0 Vicryl.  The cardinal ligaments were clamped, cut, and suture ligated in similar fashion.  It was then possible to enter the vaginal vault and the cervix was cut free from the vaginal fornices freeing the specimen.  The specimen consisting of the cervix, uterus, tubes, and ovaries.  Angles of the vaginal cuff were then ligated using figure-of-eight sutures of 0 Vicryl, and then the cuff was closed using running interlocking 0 Vicryl suture, to avoid future potential of vaginal vault prolapse.  The uterosacral ligaments were approximated in the midline to support the vaginal cuff.  Once this was done, the pelvis irrigated with warm saline.  Lap counts and instrument counts were taken and found to be correct.  There was excellent hemostasis.  At this point, it was elected to close the abdomen because of the extensive adhesive disease on the anterior abdominal wall, it is not possible to close the peritoneum.  The fascia was closed using double-stranded 0 PDS suture.  Two sutures were used each starting at the apexes of the vertical incision and meeting in the midline, where they were tied separately.  There was excellent closure of the fascia and no defects were noted.  Several interrupted plain gut 0 sutures were placed in subcutaneous tissue and then skin incision was closed using staples and a pressure dressing applied.  Estimated blood loss from the procedure was approximately 150 mL.  The patient tolerated the procedure well and went to the recovery room in satisfactory condition.  We remained clear throughout the  entire case.     Miguel Aschoff, M.D.     AR/MEDQ  D:  10/20/2012  T:  10/20/2012  Job:  [id number]

## 2012-10-22 NOTE — Progress Notes (Signed)
Pt out in wheelchair  Teaching complete   

## 2012-10-22 NOTE — Progress Notes (Signed)
Patient ID: Brittany [name], female   DOB: [birth year], 47 y.o.   MRN: [id number]  POD #2  S: Doing well reports moderate incisonal pain. Has started on regular diet  O:  Afebrile Tmax 100.0   BP 121/76   Pulse 73  Abdomen soft wound clean  Pathology benign extensive adenomyosis  A: Stable  P: Will keep in house today and reassess this PM want to insure good bowel function before discharge due to the extensive adhesions. Will allow to shower.

## 2012-10-22 NOTE — Progress Notes (Signed)
Patient ID: Brittany [name], female   DOB: [birth year], 47 y.o.   MRN: [id number]   Doing well. Would like to go home. Now only on PO meds.  Afebrile  V/S stable  Abdomen soft. Wound clean and dry and healing well  PAthology. Adenomyosis  Plan: D/C home           Return on 10/30/2012 at 10:00 Am for staple removal  To call for any problems such as fever severe pain or heavy bleeding.  Regular diet  Resume meds  Meds for home: Percocet 5/325 one every 4 hours for pain                            Xanax 0..5 mgs one every 6 hours if needed for pain                            Toradol 71m gs one every 6 hours if needed for pain  Condition improved

## 2012-10-29 NOTE — Discharge Summary (Signed)
Brittany [name], [name] NO.:  [id number]  MEDICAL RECORD NO.:  [id number]  LOCATION:  9305                          FACILITY:  [facility]  PHYSICIAN:  Miguel Aschoff, M.D.       DATE OF BIRTH:  1965-09-28  DATE OF ADMISSION:  10/20/2012 DATE OF DISCHARGE:  10/22/2012                              DISCHARGE SUMMARY   PREOPERATIVE DIAGNOSES:  Chronic pelvic pain, menorrhagia, and dysmenorrhea.  FINAL DIAGNOSES:  Chronic pelvic pain, menorrhagia, and dysmenorrhea with extensive adenomyosis and adhesions.  OPERATIONS AND PROCEDURES:  Total abdominal hysterectomy, bilateral salpingo-oophorectomy, and lysis of adhesions, general anesthesia.  BRIEF HISTORY:  The patient is a 47 year old, white female with history of pelvic pain, dysmenorrhea, and dyspareunia that has not responded to conservative therapy.  The patient's most recent cycle was extremely heavy and extreme pain and because of this, she wanted definitive procedure carried out to suffer pelvic pain and menstruations problems. Of note is that the patient did have a history of Crohn disease for which she has had prior bowel resections and she also has a history of a rectovaginal fistula secondary to the Crohn disease.  Because of this, it was felt that vaginal approach to the surgery was not indicated due to the unknown adhesions which might exit due to her Crohn disease and prior surgeries.  Therefore, the abdominal approach was taken for this procedure.  HOSPITAL COURSE:  The patient underwent a bowel preparation just in case any bowel resections were necessary and on October 20, 2012, she underwent a total abdominal hysterectomy and bilateral salpingo-oophorectomy.  FINDINGS:  At the time of surgery revealed extensive interloop bowel adhesions with the omentum adherent to the anterior abdominal wall. There, however, was no evidence of active Crohn disease.  The majority of the intestinal issues involved the adhesion.   There were no areas of the small bowel which were noted to have thickening.  The patient's prior history of a rectovaginal fistula was also investigated at least in the cul-de-sac and did not appeared to be any active Crohn disease noticed.  She did, however, have adhesions involving both ovaries and tubes, and these were dealt with at time of surgery.  The surgical procedure was carried out without a problem.  This consisted of a total abdominal hysterectomy and bilateral salpingo-oophorectomy.  Prior to the surgery, preoperative studies were obtained.  This included admission hemoglobin in the 12 range.  The patient's comprehensive metabolic panel revealed normal electrolytes.  There are no abnormalities noted there.  PT and PTT were within normal limits and screening for MRSA and staph aureus were negative.  Following the surgical procedure, she underwent an uneventful postoperative course with increasing ambulation and diet.  Her blood count remained stable and by the second postoperative day, she was felt to be in satisfactory condition, and she will be discharged home.  She reported tolerating regular diet, and passing gas and having a bowel movement.  She was sent home on October 22, 2012.  She was instructed to return to the office in 1 week for removal of her staples and to call for any problems such as fever, pain, heavy bleeding, nausea or  vomiting, or constipation.  She was instructed to take a laxative to avoid constipation.  The surgical pathology report on the surgical specimens revealed proliferative phase endometrium with extensive underlying adenomyosis.  The cervix revealed chronic cervicitis.  Tubes and ovaries were benign.  The left ovary did show a cystic corpus luteum and serosal adhesions.  The patient was sent home in satisfactory condition on a regular diet.  Medications for home included Percocet to be taken 1 every 3-4 hours as needed for pain and estradiol 1 mg  daily.  She was instructed to do no heavy lifting, place nothing in the vagina, and call if there are any postoperative problems.  She is to follow up on October 27, 2012, with an office visit to have her staples removed.     Miguel Aschoff, M.D.     AR/MEDQ  D:  10/29/2012  T:  10/29/2012  Job:  [id number]

## 2013-02-24 DIAGNOSIS — Z90710 Acquired absence of both cervix and uterus: Secondary | ICD-10-CM | POA: Insufficient documentation

## 2014-05-30 DIAGNOSIS — I10 Essential (primary) hypertension: Secondary | ICD-10-CM | POA: Diagnosis not present

## 2014-05-30 DIAGNOSIS — B373 Candidiasis of vulva and vagina: Secondary | ICD-10-CM | POA: Diagnosis not present

## 2014-05-30 DIAGNOSIS — K5090 Crohn's disease, unspecified, without complications: Secondary | ICD-10-CM | POA: Diagnosis not present

## 2014-05-30 DIAGNOSIS — E780 Pure hypercholesterolemia: Secondary | ICD-10-CM | POA: Diagnosis not present

## 2014-05-30 DIAGNOSIS — E119 Type 2 diabetes mellitus without complications: Secondary | ICD-10-CM | POA: Diagnosis not present

## 2014-06-16 DIAGNOSIS — L821 Other seborrheic keratosis: Secondary | ICD-10-CM | POA: Diagnosis not present

## 2014-06-16 DIAGNOSIS — B079 Viral wart, unspecified: Secondary | ICD-10-CM | POA: Diagnosis not present

## 2014-08-10 ENCOUNTER — Ambulatory Visit (INDEPENDENT_AMBULATORY_CARE_PROVIDER_SITE_OTHER): Payer: Commercial Managed Care - HMO | Admitting: Cardiology

## 2014-08-10 ENCOUNTER — Encounter: Payer: Self-pay | Admitting: Cardiology

## 2014-08-10 VITALS — BP 132/80 | HR 56 | Ht 65.5 in | Wt 237.8 lb

## 2014-08-10 DIAGNOSIS — R5383 Other fatigue: Secondary | ICD-10-CM | POA: Insufficient documentation

## 2014-08-10 DIAGNOSIS — R011 Cardiac murmur, unspecified: Secondary | ICD-10-CM | POA: Diagnosis not present

## 2014-08-10 DIAGNOSIS — R079 Chest pain, unspecified: Secondary | ICD-10-CM | POA: Diagnosis not present

## 2014-08-10 DIAGNOSIS — T733XXA Exhaustion due to excessive exertion, initial encounter: Secondary | ICD-10-CM | POA: Diagnosis not present

## 2014-08-10 DIAGNOSIS — R001 Bradycardia, unspecified: Secondary | ICD-10-CM | POA: Diagnosis not present

## 2014-08-10 HISTORY — DX: Bradycardia, unspecified: R00.1

## 2014-08-10 HISTORY — DX: Other fatigue: R53.83

## 2014-08-10 NOTE — Patient Instructions (Signed)
Your physician has requested that you have an echocardiogram. Echocardiography is a painless test that uses sound waves to create images of your heart. It provides your doctor with information about the size and shape of your heart and how well your heart's chambers and valves are working. This procedure takes approximately one hour. There are no restrictions for this procedure.  Your physician has requested that you have a lexiscan myoview. For further information please visit [email or web address]. Please follow instruction sheet, as given.  Your physician has recommended that you wear a holter monitor. Holter monitors are medical devices that record the heart's electrical activity. Doctors most often use these monitors to diagnose arrhythmias. Arrhythmias are problems with the speed or rhythm of the heartbeat. The monitor is a small, portable device. You can wear one while you do your normal daily activities. This is usually used to diagnose what is causing palpitations/syncope (passing out).  Your physician recommends that you schedule a follow-up appointment AS NEEDED with Dr. Radford Pax pending study results.

## 2014-08-10 NOTE — Progress Notes (Signed)
Cardiology Office Note   Date:  08/10/2014   ID:  Brittany [name], DOB [birth year], MRN [id number]  PCP:  [name], MD    Chief Complaint  Patient presents with  . Heart Murmur  . Chest Pain      History of Present Illness: Brittany [name] is a 49 y.o. female who presents for evaluation of heart murmur, exercise intolerance and fatigue.  She was recently seen by GI at [facility] for followup of her Crohn's disease and complained of exercise intolerance and fatigue.  She was also complaining of some chest pain.  She was found to have a new systolic murmur on exam.  She has family history of CAD and is now referred for further evaluation.  She says that the fatigue has been occurring for the past 3-4 months.  She will notice it after being active for a while.  She has noticed exercise intolerance with activities she used to be able to do without any problems.  She says that the chest discomfort is very infrequent occurring every few months lasting 15 minutes and resolves with rest.  She describes it as a sharp pain that radiates to her left arm.  There are no associated symptoms with the pain.  She has noticed some DOE.  She has chronic LE edema that she has had for some time and has gotten slightly worse recently.  She denies any palpitations or syncope.      Past Medical History  Diagnosis Date  . Hypertension   . Crohn's disease   . Diabetes mellitus without complication   . Hyperlipidemia     Past Surgical History  Procedure Laterality Date  . Appendectomy    . Tonsillectomy    . Anal fissure repair  2002  . Small intestine surgery      reconstruction  . Abdominal hysterectomy N/A 10/20/2012    Procedure: HYSTERECTOMY ABDOMINAL;  Surgeon: [name], MD;  Location: [facility];  Service: Gynecology;  Laterality: N/A;  . Salpingoophorectomy Bilateral 10/20/2012    Procedure: SALPINGO OOPHORECTOMY;  Surgeon: [name], MD;  Location: [facility];  Service: Gynecology;  Laterality: Bilateral;      Current Outpatient Prescriptions  Medication Sig Dispense Refill  . acetaminophen (TYLENOL) 500 MG tablet Take 500 mg by mouth every 6 (six) hours as needed for pain (for menstual cramps).    . ALPRAZolam (XANAX) 0.5 MG tablet Take 0.5 mg by mouth at bedtime.    . calcium-vitamin D (OSCAL WITH D) 500-200 MG-UNIT per tablet Take 1 tablet by mouth daily.    . cyanocobalamin (,VITAMIN B-12,) 1000 MCG/ML injection Inject 1,000 mLs into the skin every 30 (thirty) days.    . Cyanocobalamin (B-12) 5000 MCG SUBL Place 5,000 mcg under the tongue daily.    Marland Kitchen estradiol (ESTRACE) 1 MG tablet Take 1 mg by mouth daily.    . fish oil-omega-3 fatty acids 1000 MG capsule Take 1 g by mouth daily.    . Glucosamine Sulfate (GLUCOSAMINE RELIEF) 1000 MG TABS Take 2 tablets by mouth daily.    Marland Kitchen HYDROcodone-acetaminophen (VICODIN) 5-500 MG per tablet Take 1 tablet by mouth every 6 (six) hours as needed for pain.    Marland Kitchen lisinopril (PRINIVIL,ZESTRIL) 5 MG tablet Take 5 mg by mouth daily.    . simvastatin (ZOCOR) 20 MG tablet Take 20 mg by mouth daily.    Marland Kitchen Ustekinumab 90 MG/ML SOLN Inject 90 mg into the skin every 28 (twenty-eight) days. In [facility] Drug Study.  No current facility-administered medications for this visit.    Allergies:   Mercaptopurine; Morphine and related; and Penicillins    Social History:  The patient  reports that she has quit smoking. She does not have any smokeless tobacco history on file. She reports that she does not drink alcohol or use illicit drugs.   Family History:  The patient's family history includes Diabetes in her father and mother; Heart Problems in her father and mother.    ROS:  Please see the history of present illness.   Otherwise, review of systems are positive for none.   All other systems are reviewed and negative.    PHYSICAL EXAM: VS:  Ht 5' 5.5" (1.664 m)  Wt 237 lb 12.8 oz (107.865 kg)  BMI 38.96 kg/m2  LMP 09/23/2012 , BMI Body mass index is 38.96  kg/(m^2). GEN: Well nourished, well developed, in no acute distress HEENT: normal Neck: no JVD, carotid bruits, or masses Cardiac: RRR; no murmurs, rubs, or gallops,no edema  Respiratory:  clear to auscultation bilaterally, normal work of breathing GI: soft, nontender, nondistended, + BS MS: no deformity or atrophy Skin: warm and dry, no rash Neuro:  Strength and sensation are intact Psych: euthymic mood, full affect   EKG:  EKG is ordered today. The ekg ordered today demonstrates sinus bradycardia at 56bpm with no ST changes   Recent Labs: No results found for requested labs within last 365 days.    Lipid Panel No results found for: CHOL, TRIG, HDL, CHOLHDL, VLDL, LDLCALC, LDLDIRECT    Wt Readings from Last 3 Encounters:  08/10/14 237 lb 12.8 oz (107.865 kg)  10/19/12 238 lb (107.956 kg)        ASSESSMENT AND PLAN:  1.  Chest pain which is somewhat atypical and infrequent but in the setting of new exertional fatigue I have recommended a Lexiscan myoview to rule out ischemia. She has chronic back pain and cannot walk on the treadmill.   I will also get a 2D echo to assess LVF.   2.  Exertional fatigue which could be an anginal equivalent but also could be due to chronotropic incompetence given her resting bradycardia. I will get a 24 hour holter monitor to assess average heart rate 3.  Heart murmur - I do not hear a murmur on exam and suspect this was due to breath sounds.     Current medicines are reviewed at length with the patient today.  The patient does not have concerns regarding medicines.  The following changes have been made:  no change  Labs/ tests ordered today include: see above assessment and plan No orders of the defined types were placed in this encounter.     Disposition:   FU with me PRN pending results of studies   Signed, Sueanne Margarita, MD  08/10/2014 1:35 PM    [facility] [place], [place], [place]   [address] Phone: [phone]; Fax: [phone]

## 2014-08-23 DIAGNOSIS — L821 Other seborrheic keratosis: Secondary | ICD-10-CM | POA: Diagnosis not present

## 2014-08-23 DIAGNOSIS — B079 Viral wart, unspecified: Secondary | ICD-10-CM | POA: Diagnosis not present

## 2014-08-23 DIAGNOSIS — L820 Inflamed seborrheic keratosis: Secondary | ICD-10-CM | POA: Diagnosis not present

## 2014-09-13 ENCOUNTER — Other Ambulatory Visit: Payer: Self-pay

## 2014-09-13 ENCOUNTER — Ambulatory Visit (HOSPITAL_COMMUNITY): Payer: Commercial Managed Care - HMO | Attending: Internal Medicine

## 2014-09-13 DIAGNOSIS — R011 Cardiac murmur, unspecified: Secondary | ICD-10-CM | POA: Diagnosis not present

## 2014-09-13 DIAGNOSIS — E780 Pure hypercholesterolemia: Secondary | ICD-10-CM | POA: Diagnosis not present

## 2014-09-13 DIAGNOSIS — M25551 Pain in right hip: Secondary | ICD-10-CM | POA: Diagnosis not present

## 2014-09-13 DIAGNOSIS — M25552 Pain in left hip: Secondary | ICD-10-CM | POA: Diagnosis not present

## 2014-09-13 DIAGNOSIS — R079 Chest pain, unspecified: Secondary | ICD-10-CM | POA: Diagnosis not present

## 2014-09-13 DIAGNOSIS — R600 Localized edema: Secondary | ICD-10-CM | POA: Diagnosis not present

## 2014-09-13 DIAGNOSIS — M160 Bilateral primary osteoarthritis of hip: Secondary | ICD-10-CM | POA: Diagnosis not present

## 2014-09-13 DIAGNOSIS — E119 Type 2 diabetes mellitus without complications: Secondary | ICD-10-CM | POA: Diagnosis not present

## 2014-09-13 DIAGNOSIS — I10 Essential (primary) hypertension: Secondary | ICD-10-CM | POA: Diagnosis not present

## 2014-12-14 DIAGNOSIS — E119 Type 2 diabetes mellitus without complications: Secondary | ICD-10-CM | POA: Diagnosis not present

## 2014-12-14 DIAGNOSIS — E780 Pure hypercholesterolemia: Secondary | ICD-10-CM | POA: Diagnosis not present

## 2014-12-14 DIAGNOSIS — I10 Essential (primary) hypertension: Secondary | ICD-10-CM | POA: Diagnosis not present

## 2014-12-14 DIAGNOSIS — M25552 Pain in left hip: Secondary | ICD-10-CM | POA: Diagnosis not present

## 2014-12-14 DIAGNOSIS — M25551 Pain in right hip: Secondary | ICD-10-CM | POA: Diagnosis not present

## 2014-12-14 DIAGNOSIS — R011 Cardiac murmur, unspecified: Secondary | ICD-10-CM | POA: Diagnosis not present

## 2014-12-14 DIAGNOSIS — E782 Mixed hyperlipidemia: Secondary | ICD-10-CM | POA: Diagnosis not present

## 2014-12-14 DIAGNOSIS — R600 Localized edema: Secondary | ICD-10-CM | POA: Diagnosis not present

## 2015-01-02 DIAGNOSIS — J060 Acute laryngopharyngitis: Secondary | ICD-10-CM | POA: Diagnosis not present

## 2015-01-02 DIAGNOSIS — J0101 Acute recurrent maxillary sinusitis: Secondary | ICD-10-CM | POA: Diagnosis not present

## 2015-02-01 DIAGNOSIS — Z1231 Encounter for screening mammogram for malignant neoplasm of breast: Secondary | ICD-10-CM | POA: Diagnosis not present

## 2015-02-01 DIAGNOSIS — L989 Disorder of the skin and subcutaneous tissue, unspecified: Secondary | ICD-10-CM | POA: Diagnosis not present

## 2015-02-01 DIAGNOSIS — L089 Local infection of the skin and subcutaneous tissue, unspecified: Secondary | ICD-10-CM | POA: Diagnosis not present

## 2015-02-01 DIAGNOSIS — N959 Unspecified menopausal and perimenopausal disorder: Secondary | ICD-10-CM | POA: Diagnosis not present

## 2015-02-01 DIAGNOSIS — K5090 Crohn's disease, unspecified, without complications: Secondary | ICD-10-CM | POA: Diagnosis not present

## 2015-02-01 DIAGNOSIS — I10 Essential (primary) hypertension: Secondary | ICD-10-CM | POA: Diagnosis not present

## 2015-02-06 DIAGNOSIS — J018 Other acute sinusitis: Secondary | ICD-10-CM | POA: Diagnosis not present

## 2015-02-06 DIAGNOSIS — J0100 Acute maxillary sinusitis, unspecified: Secondary | ICD-10-CM | POA: Diagnosis not present

## 2015-03-06 DIAGNOSIS — M5126 Other intervertebral disc displacement, lumbar region: Secondary | ICD-10-CM | POA: Diagnosis not present

## 2015-03-06 DIAGNOSIS — M5136 Other intervertebral disc degeneration, lumbar region: Secondary | ICD-10-CM | POA: Diagnosis not present

## 2015-03-08 DIAGNOSIS — H5210 Myopia, unspecified eye: Secondary | ICD-10-CM | POA: Diagnosis not present

## 2015-03-08 DIAGNOSIS — H5203 Hypermetropia, bilateral: Secondary | ICD-10-CM | POA: Diagnosis not present

## 2015-03-08 DIAGNOSIS — M545 Low back pain: Secondary | ICD-10-CM | POA: Diagnosis not present

## 2015-03-20 DIAGNOSIS — M5136 Other intervertebral disc degeneration, lumbar region: Secondary | ICD-10-CM | POA: Diagnosis not present

## 2015-03-28 DIAGNOSIS — Z23 Encounter for immunization: Secondary | ICD-10-CM | POA: Diagnosis not present

## 2015-03-28 DIAGNOSIS — E119 Type 2 diabetes mellitus without complications: Secondary | ICD-10-CM | POA: Diagnosis not present

## 2015-03-28 DIAGNOSIS — E782 Mixed hyperlipidemia: Secondary | ICD-10-CM | POA: Diagnosis not present

## 2015-03-28 DIAGNOSIS — M25552 Pain in left hip: Secondary | ICD-10-CM | POA: Diagnosis not present

## 2015-03-28 DIAGNOSIS — E531 Pyridoxine deficiency: Secondary | ICD-10-CM | POA: Diagnosis not present

## 2015-03-28 DIAGNOSIS — R600 Localized edema: Secondary | ICD-10-CM | POA: Diagnosis not present

## 2015-03-28 DIAGNOSIS — I10 Essential (primary) hypertension: Secondary | ICD-10-CM | POA: Diagnosis not present

## 2015-03-28 DIAGNOSIS — M25551 Pain in right hip: Secondary | ICD-10-CM | POA: Diagnosis not present

## 2015-04-10 DIAGNOSIS — M5136 Other intervertebral disc degeneration, lumbar region: Secondary | ICD-10-CM | POA: Diagnosis not present

## 2015-05-02 DIAGNOSIS — M545 Low back pain: Secondary | ICD-10-CM | POA: Diagnosis not present

## 2015-05-02 DIAGNOSIS — M25552 Pain in left hip: Secondary | ICD-10-CM | POA: Diagnosis not present

## 2015-05-02 DIAGNOSIS — M5106 Intervertebral disc disorders with myelopathy, lumbar region: Secondary | ICD-10-CM | POA: Diagnosis not present

## 2015-05-03 DIAGNOSIS — J060 Acute laryngopharyngitis: Secondary | ICD-10-CM | POA: Diagnosis not present

## 2015-05-03 DIAGNOSIS — Z20828 Contact with and (suspected) exposure to other viral communicable diseases: Secondary | ICD-10-CM | POA: Diagnosis not present

## 2015-05-10 DIAGNOSIS — M5136 Other intervertebral disc degeneration, lumbar region: Secondary | ICD-10-CM | POA: Diagnosis not present

## 2015-05-25 DIAGNOSIS — M5136 Other intervertebral disc degeneration, lumbar region: Secondary | ICD-10-CM | POA: Diagnosis not present

## 2015-05-25 DIAGNOSIS — M4806 Spinal stenosis, lumbar region: Secondary | ICD-10-CM | POA: Diagnosis not present

## 2015-05-25 DIAGNOSIS — M5126 Other intervertebral disc displacement, lumbar region: Secondary | ICD-10-CM | POA: Diagnosis not present

## 2015-05-25 DIAGNOSIS — M47816 Spondylosis without myelopathy or radiculopathy, lumbar region: Secondary | ICD-10-CM | POA: Diagnosis not present

## 2015-05-25 DIAGNOSIS — M47896 Other spondylosis, lumbar region: Secondary | ICD-10-CM | POA: Diagnosis not present

## 2015-05-25 DIAGNOSIS — M5106 Intervertebral disc disorders with myelopathy, lumbar region: Secondary | ICD-10-CM | POA: Diagnosis not present

## 2015-05-25 DIAGNOSIS — M5127 Other intervertebral disc displacement, lumbosacral region: Secondary | ICD-10-CM | POA: Diagnosis not present

## 2015-05-25 DIAGNOSIS — M545 Low back pain: Secondary | ICD-10-CM | POA: Diagnosis not present

## 2015-05-26 DIAGNOSIS — Z6837 Body mass index (BMI) 37.0-37.9, adult: Secondary | ICD-10-CM | POA: Diagnosis not present

## 2015-05-26 DIAGNOSIS — M5431 Sciatica, right side: Secondary | ICD-10-CM | POA: Diagnosis not present

## 2015-05-26 DIAGNOSIS — M5116 Intervertebral disc disorders with radiculopathy, lumbar region: Secondary | ICD-10-CM | POA: Diagnosis not present

## 2015-05-26 DIAGNOSIS — M5432 Sciatica, left side: Secondary | ICD-10-CM | POA: Diagnosis not present

## 2015-05-31 DIAGNOSIS — M545 Low back pain: Secondary | ICD-10-CM | POA: Diagnosis not present

## 2015-05-31 DIAGNOSIS — M5432 Sciatica, left side: Secondary | ICD-10-CM | POA: Diagnosis not present

## 2015-05-31 DIAGNOSIS — E785 Hyperlipidemia, unspecified: Secondary | ICD-10-CM | POA: Diagnosis not present

## 2015-05-31 DIAGNOSIS — M4806 Spinal stenosis, lumbar region: Secondary | ICD-10-CM | POA: Diagnosis not present

## 2015-05-31 DIAGNOSIS — M4726 Other spondylosis with radiculopathy, lumbar region: Secondary | ICD-10-CM | POA: Diagnosis not present

## 2015-05-31 DIAGNOSIS — R200 Anesthesia of skin: Secondary | ICD-10-CM | POA: Diagnosis not present

## 2015-05-31 DIAGNOSIS — M5431 Sciatica, right side: Secondary | ICD-10-CM | POA: Diagnosis not present

## 2015-05-31 DIAGNOSIS — Z9049 Acquired absence of other specified parts of digestive tract: Secondary | ICD-10-CM | POA: Diagnosis not present

## 2015-05-31 DIAGNOSIS — E119 Type 2 diabetes mellitus without complications: Secondary | ICD-10-CM | POA: Diagnosis not present

## 2015-05-31 DIAGNOSIS — Z79899 Other long term (current) drug therapy: Secondary | ICD-10-CM | POA: Diagnosis not present

## 2015-05-31 DIAGNOSIS — Z87891 Personal history of nicotine dependence: Secondary | ICD-10-CM | POA: Diagnosis not present

## 2015-05-31 DIAGNOSIS — Z01810 Encounter for preprocedural cardiovascular examination: Secondary | ICD-10-CM | POA: Diagnosis not present

## 2015-05-31 DIAGNOSIS — M5116 Intervertebral disc disorders with radiculopathy, lumbar region: Secondary | ICD-10-CM | POA: Diagnosis not present

## 2015-05-31 DIAGNOSIS — M5136 Other intervertebral disc degeneration, lumbar region: Secondary | ICD-10-CM | POA: Diagnosis not present

## 2015-06-01 DIAGNOSIS — E785 Hyperlipidemia, unspecified: Secondary | ICD-10-CM | POA: Diagnosis not present

## 2015-06-01 DIAGNOSIS — R200 Anesthesia of skin: Secondary | ICD-10-CM | POA: Diagnosis not present

## 2015-06-01 DIAGNOSIS — Z79899 Other long term (current) drug therapy: Secondary | ICD-10-CM | POA: Diagnosis not present

## 2015-06-01 DIAGNOSIS — M5116 Intervertebral disc disorders with radiculopathy, lumbar region: Secondary | ICD-10-CM | POA: Diagnosis not present

## 2015-06-01 DIAGNOSIS — M4806 Spinal stenosis, lumbar region: Secondary | ICD-10-CM | POA: Diagnosis not present

## 2015-06-01 DIAGNOSIS — M4726 Other spondylosis with radiculopathy, lumbar region: Secondary | ICD-10-CM | POA: Diagnosis not present

## 2015-06-01 DIAGNOSIS — Z9049 Acquired absence of other specified parts of digestive tract: Secondary | ICD-10-CM | POA: Diagnosis not present

## 2015-06-01 DIAGNOSIS — Z87891 Personal history of nicotine dependence: Secondary | ICD-10-CM | POA: Diagnosis not present

## 2015-06-01 DIAGNOSIS — E119 Type 2 diabetes mellitus without complications: Secondary | ICD-10-CM | POA: Diagnosis not present

## 2015-06-30 DIAGNOSIS — M5136 Other intervertebral disc degeneration, lumbar region: Secondary | ICD-10-CM | POA: Diagnosis not present

## 2015-07-26 DIAGNOSIS — E119 Type 2 diabetes mellitus without complications: Secondary | ICD-10-CM | POA: Diagnosis not present

## 2015-07-26 DIAGNOSIS — I10 Essential (primary) hypertension: Secondary | ICD-10-CM | POA: Diagnosis not present

## 2015-07-26 DIAGNOSIS — E782 Mixed hyperlipidemia: Secondary | ICD-10-CM | POA: Diagnosis not present

## 2015-08-10 DIAGNOSIS — K5080 Crohn's disease of both small and large intestine without complications: Secondary | ICD-10-CM | POA: Diagnosis not present

## 2015-08-15 DIAGNOSIS — M545 Low back pain: Secondary | ICD-10-CM | POA: Diagnosis not present

## 2015-09-18 DIAGNOSIS — B079 Viral wart, unspecified: Secondary | ICD-10-CM | POA: Diagnosis not present

## 2015-09-18 DIAGNOSIS — L578 Other skin changes due to chronic exposure to nonionizing radiation: Secondary | ICD-10-CM | POA: Diagnosis not present

## 2015-09-18 DIAGNOSIS — L570 Actinic keratosis: Secondary | ICD-10-CM | POA: Diagnosis not present

## 2015-09-18 DIAGNOSIS — L728 Other follicular cysts of the skin and subcutaneous tissue: Secondary | ICD-10-CM | POA: Diagnosis not present

## 2015-11-01 DIAGNOSIS — E119 Type 2 diabetes mellitus without complications: Secondary | ICD-10-CM | POA: Diagnosis not present

## 2015-11-01 DIAGNOSIS — E782 Mixed hyperlipidemia: Secondary | ICD-10-CM | POA: Diagnosis not present

## 2015-11-01 DIAGNOSIS — I10 Essential (primary) hypertension: Secondary | ICD-10-CM | POA: Diagnosis not present

## 2015-11-01 DIAGNOSIS — D848 Other specified immunodeficiencies: Secondary | ICD-10-CM | POA: Diagnosis not present

## 2015-11-01 DIAGNOSIS — K5090 Crohn's disease, unspecified, without complications: Secondary | ICD-10-CM | POA: Diagnosis not present

## 2015-11-16 DIAGNOSIS — Z6836 Body mass index (BMI) 36.0-36.9, adult: Secondary | ICD-10-CM | POA: Diagnosis not present

## 2015-11-16 DIAGNOSIS — M5136 Other intervertebral disc degeneration, lumbar region: Secondary | ICD-10-CM | POA: Diagnosis not present

## 2015-11-16 DIAGNOSIS — M4726 Other spondylosis with radiculopathy, lumbar region: Secondary | ICD-10-CM | POA: Diagnosis not present

## 2015-11-16 DIAGNOSIS — M5106 Intervertebral disc disorders with myelopathy, lumbar region: Secondary | ICD-10-CM | POA: Diagnosis not present

## 2016-01-25 DIAGNOSIS — N3090 Cystitis, unspecified without hematuria: Secondary | ICD-10-CM | POA: Diagnosis not present

## 2016-02-13 DIAGNOSIS — E782 Mixed hyperlipidemia: Secondary | ICD-10-CM | POA: Diagnosis not present

## 2016-02-13 DIAGNOSIS — I10 Essential (primary) hypertension: Secondary | ICD-10-CM | POA: Diagnosis not present

## 2016-02-13 DIAGNOSIS — E119 Type 2 diabetes mellitus without complications: Secondary | ICD-10-CM | POA: Diagnosis not present

## 2016-02-13 DIAGNOSIS — Z23 Encounter for immunization: Secondary | ICD-10-CM | POA: Diagnosis not present

## 2016-02-13 DIAGNOSIS — K5090 Crohn's disease, unspecified, without complications: Secondary | ICD-10-CM | POA: Diagnosis not present

## 2016-04-24 DIAGNOSIS — Z01419 Encounter for gynecological examination (general) (routine) without abnormal findings: Secondary | ICD-10-CM | POA: Diagnosis not present

## 2016-04-24 DIAGNOSIS — Z1231 Encounter for screening mammogram for malignant neoplasm of breast: Secondary | ICD-10-CM | POA: Diagnosis not present

## 2016-04-24 DIAGNOSIS — Z6839 Body mass index (BMI) 39.0-39.9, adult: Secondary | ICD-10-CM | POA: Diagnosis not present

## 2016-04-26 DIAGNOSIS — H5203 Hypermetropia, bilateral: Secondary | ICD-10-CM | POA: Diagnosis not present

## 2016-05-23 DIAGNOSIS — E1121 Type 2 diabetes mellitus with diabetic nephropathy: Secondary | ICD-10-CM | POA: Diagnosis not present

## 2016-05-23 DIAGNOSIS — K50918 Crohn's disease, unspecified, with other complication: Secondary | ICD-10-CM | POA: Diagnosis not present

## 2016-05-23 DIAGNOSIS — E538 Deficiency of other specified B group vitamins: Secondary | ICD-10-CM | POA: Diagnosis not present

## 2016-05-23 DIAGNOSIS — E782 Mixed hyperlipidemia: Secondary | ICD-10-CM | POA: Diagnosis not present

## 2016-05-23 DIAGNOSIS — I10 Essential (primary) hypertension: Secondary | ICD-10-CM | POA: Diagnosis not present

## 2016-06-18 DIAGNOSIS — K5000 Crohn's disease of small intestine without complications: Secondary | ICD-10-CM | POA: Diagnosis not present

## 2016-06-18 DIAGNOSIS — Z79899 Other long term (current) drug therapy: Secondary | ICD-10-CM | POA: Diagnosis not present

## 2016-06-18 DIAGNOSIS — R42 Dizziness and giddiness: Secondary | ICD-10-CM | POA: Diagnosis not present

## 2016-06-18 DIAGNOSIS — K6289 Other specified diseases of anus and rectum: Secondary | ICD-10-CM | POA: Diagnosis not present

## 2016-07-02 DIAGNOSIS — I10 Essential (primary) hypertension: Secondary | ICD-10-CM | POA: Diagnosis not present

## 2016-07-02 DIAGNOSIS — H02839 Dermatochalasis of unspecified eye, unspecified eyelid: Secondary | ICD-10-CM | POA: Diagnosis not present

## 2016-07-02 DIAGNOSIS — H2513 Age-related nuclear cataract, bilateral: Secondary | ICD-10-CM | POA: Diagnosis not present

## 2016-07-02 DIAGNOSIS — H18411 Arcus senilis, right eye: Secondary | ICD-10-CM | POA: Diagnosis not present

## 2016-07-02 DIAGNOSIS — H2512 Age-related nuclear cataract, left eye: Secondary | ICD-10-CM | POA: Diagnosis not present

## 2016-07-09 DIAGNOSIS — F4321 Adjustment disorder with depressed mood: Secondary | ICD-10-CM | POA: Diagnosis not present

## 2016-07-09 DIAGNOSIS — I10 Essential (primary) hypertension: Secondary | ICD-10-CM | POA: Diagnosis not present

## 2016-07-11 DIAGNOSIS — J018 Other acute sinusitis: Secondary | ICD-10-CM | POA: Diagnosis not present

## 2016-07-15 DIAGNOSIS — M5432 Sciatica, left side: Secondary | ICD-10-CM | POA: Diagnosis not present

## 2016-07-15 DIAGNOSIS — Z6837 Body mass index (BMI) 37.0-37.9, adult: Secondary | ICD-10-CM | POA: Diagnosis not present

## 2016-07-15 DIAGNOSIS — M4726 Other spondylosis with radiculopathy, lumbar region: Secondary | ICD-10-CM | POA: Diagnosis not present

## 2016-07-15 DIAGNOSIS — M5431 Sciatica, right side: Secondary | ICD-10-CM | POA: Diagnosis not present

## 2016-07-26 DIAGNOSIS — H2511 Age-related nuclear cataract, right eye: Secondary | ICD-10-CM | POA: Diagnosis not present

## 2016-07-26 DIAGNOSIS — H25812 Combined forms of age-related cataract, left eye: Secondary | ICD-10-CM | POA: Diagnosis not present

## 2016-07-26 DIAGNOSIS — H2512 Age-related nuclear cataract, left eye: Secondary | ICD-10-CM | POA: Diagnosis not present

## 2016-08-05 DIAGNOSIS — M25562 Pain in left knee: Secondary | ICD-10-CM | POA: Diagnosis not present

## 2016-08-05 DIAGNOSIS — M1712 Unilateral primary osteoarthritis, left knee: Secondary | ICD-10-CM | POA: Diagnosis not present

## 2016-08-05 DIAGNOSIS — M170 Bilateral primary osteoarthritis of knee: Secondary | ICD-10-CM | POA: Diagnosis not present

## 2016-08-27 DIAGNOSIS — M25562 Pain in left knee: Secondary | ICD-10-CM | POA: Diagnosis not present

## 2016-08-27 DIAGNOSIS — M25561 Pain in right knee: Secondary | ICD-10-CM | POA: Diagnosis not present

## 2016-08-27 DIAGNOSIS — M170 Bilateral primary osteoarthritis of knee: Secondary | ICD-10-CM | POA: Diagnosis not present

## 2016-08-27 DIAGNOSIS — M1712 Unilateral primary osteoarthritis, left knee: Secondary | ICD-10-CM | POA: Diagnosis not present

## 2016-08-27 DIAGNOSIS — R262 Difficulty in walking, not elsewhere classified: Secondary | ICD-10-CM | POA: Diagnosis not present

## 2016-08-28 DIAGNOSIS — F4323 Adjustment disorder with mixed anxiety and depressed mood: Secondary | ICD-10-CM | POA: Diagnosis not present

## 2016-08-28 DIAGNOSIS — I10 Essential (primary) hypertension: Secondary | ICD-10-CM | POA: Diagnosis not present

## 2016-08-28 DIAGNOSIS — E782 Mixed hyperlipidemia: Secondary | ICD-10-CM | POA: Diagnosis not present

## 2016-08-28 DIAGNOSIS — K5090 Crohn's disease, unspecified, without complications: Secondary | ICD-10-CM | POA: Diagnosis not present

## 2016-08-28 DIAGNOSIS — R7301 Impaired fasting glucose: Secondary | ICD-10-CM | POA: Diagnosis not present

## 2016-08-30 DIAGNOSIS — H2513 Age-related nuclear cataract, bilateral: Secondary | ICD-10-CM | POA: Diagnosis not present

## 2016-08-30 DIAGNOSIS — H2511 Age-related nuclear cataract, right eye: Secondary | ICD-10-CM | POA: Diagnosis not present

## 2016-08-30 DIAGNOSIS — H25811 Combined forms of age-related cataract, right eye: Secondary | ICD-10-CM | POA: Diagnosis not present

## 2016-09-02 DIAGNOSIS — M25562 Pain in left knee: Secondary | ICD-10-CM | POA: Diagnosis not present

## 2016-09-02 DIAGNOSIS — M1712 Unilateral primary osteoarthritis, left knee: Secondary | ICD-10-CM | POA: Diagnosis not present

## 2016-09-04 DIAGNOSIS — M1711 Unilateral primary osteoarthritis, right knee: Secondary | ICD-10-CM | POA: Diagnosis not present

## 2016-09-04 DIAGNOSIS — M25561 Pain in right knee: Secondary | ICD-10-CM | POA: Diagnosis not present

## 2016-09-11 DIAGNOSIS — M25562 Pain in left knee: Secondary | ICD-10-CM | POA: Diagnosis not present

## 2016-09-11 DIAGNOSIS — M1712 Unilateral primary osteoarthritis, left knee: Secondary | ICD-10-CM | POA: Diagnosis not present

## 2016-09-12 DIAGNOSIS — M25561 Pain in right knee: Secondary | ICD-10-CM | POA: Diagnosis not present

## 2016-09-12 DIAGNOSIS — M1711 Unilateral primary osteoarthritis, right knee: Secondary | ICD-10-CM | POA: Diagnosis not present

## 2016-09-19 DIAGNOSIS — M25561 Pain in right knee: Secondary | ICD-10-CM | POA: Diagnosis not present

## 2016-09-19 DIAGNOSIS — M25562 Pain in left knee: Secondary | ICD-10-CM | POA: Diagnosis not present

## 2016-09-19 DIAGNOSIS — M170 Bilateral primary osteoarthritis of knee: Secondary | ICD-10-CM | POA: Diagnosis not present

## 2016-10-17 DIAGNOSIS — M5116 Intervertebral disc disorders with radiculopathy, lumbar region: Secondary | ICD-10-CM | POA: Diagnosis not present

## 2016-10-17 DIAGNOSIS — M5432 Sciatica, left side: Secondary | ICD-10-CM | POA: Diagnosis not present

## 2016-10-17 DIAGNOSIS — M4726 Other spondylosis with radiculopathy, lumbar region: Secondary | ICD-10-CM | POA: Diagnosis not present

## 2016-10-17 DIAGNOSIS — Z6837 Body mass index (BMI) 37.0-37.9, adult: Secondary | ICD-10-CM | POA: Diagnosis not present

## 2016-10-24 DIAGNOSIS — M545 Low back pain: Secondary | ICD-10-CM | POA: Diagnosis not present

## 2016-10-24 DIAGNOSIS — M4726 Other spondylosis with radiculopathy, lumbar region: Secondary | ICD-10-CM | POA: Diagnosis not present

## 2016-10-31 DIAGNOSIS — M4726 Other spondylosis with radiculopathy, lumbar region: Secondary | ICD-10-CM | POA: Diagnosis not present

## 2016-10-31 DIAGNOSIS — M961 Postlaminectomy syndrome, not elsewhere classified: Secondary | ICD-10-CM | POA: Diagnosis not present

## 2016-10-31 DIAGNOSIS — M5432 Sciatica, left side: Secondary | ICD-10-CM | POA: Diagnosis not present

## 2016-11-11 DIAGNOSIS — M47816 Spondylosis without myelopathy or radiculopathy, lumbar region: Secondary | ICD-10-CM | POA: Diagnosis not present

## 2016-11-11 DIAGNOSIS — M5416 Radiculopathy, lumbar region: Secondary | ICD-10-CM | POA: Diagnosis not present

## 2016-11-25 DIAGNOSIS — M5416 Radiculopathy, lumbar region: Secondary | ICD-10-CM | POA: Diagnosis not present

## 2016-11-27 MED ORDER — ALPRAZOLAM 0.5 MG TABLET
ORAL_TABLET | 1 refills | 0 days | Status: CP
Start: 2016-11-27 — End: 2017-04-30

## 2016-12-02 DIAGNOSIS — M5106 Intervertebral disc disorders with myelopathy, lumbar region: Secondary | ICD-10-CM | POA: Diagnosis not present

## 2016-12-02 DIAGNOSIS — I10 Essential (primary) hypertension: Secondary | ICD-10-CM | POA: Diagnosis not present

## 2016-12-02 DIAGNOSIS — K5090 Crohn's disease, unspecified, without complications: Secondary | ICD-10-CM | POA: Diagnosis not present

## 2016-12-02 DIAGNOSIS — R432 Parageusia: Secondary | ICD-10-CM | POA: Diagnosis not present

## 2016-12-02 DIAGNOSIS — E1142 Type 2 diabetes mellitus with diabetic polyneuropathy: Secondary | ICD-10-CM | POA: Diagnosis not present

## 2016-12-02 DIAGNOSIS — E1121 Type 2 diabetes mellitus with diabetic nephropathy: Secondary | ICD-10-CM | POA: Diagnosis not present

## 2016-12-02 DIAGNOSIS — E782 Mixed hyperlipidemia: Secondary | ICD-10-CM | POA: Diagnosis not present

## 2016-12-16 DIAGNOSIS — M5416 Radiculopathy, lumbar region: Secondary | ICD-10-CM | POA: Diagnosis not present

## 2016-12-16 MED ORDER — USTEKINUMAB 90 MG/ML SUBCUTANEOUS SYRINGE
0 refills | 0 days | Status: CP
Start: 2016-12-16 — End: 2017-03-11

## 2016-12-23 ENCOUNTER — Ambulatory Visit
Admission: RE | Admit: 2016-12-23 | Discharge: 2016-12-23 | Disposition: A | Discharge status: Home / Self Care | Payer: MEDICARE

## 2016-12-23 DIAGNOSIS — Z006 Encounter for examination for normal comparison and control in clinical research program: Principal | ICD-10-CM

## 2016-12-23 MED ORDER — FLUCONAZOLE 100 MG TABLET
ORAL_TABLET | Freq: Every day | ORAL | 0 refills | 0.00000 days | Status: CP
Start: 2016-12-23 — End: 2018-03-18

## 2017-01-14 DIAGNOSIS — M791 Myalgia: Secondary | ICD-10-CM | POA: Diagnosis not present

## 2017-01-14 DIAGNOSIS — M5416 Radiculopathy, lumbar region: Secondary | ICD-10-CM | POA: Diagnosis not present

## 2017-01-14 DIAGNOSIS — M545 Low back pain: Secondary | ICD-10-CM | POA: Diagnosis not present

## 2017-01-14 DIAGNOSIS — M5116 Intervertebral disc disorders with radiculopathy, lumbar region: Secondary | ICD-10-CM | POA: Diagnosis not present

## 2017-02-18 ENCOUNTER — Ambulatory Visit
Admission: RE | Admit: 2017-02-18 | Discharge: 2017-02-18 | Disposition: A | Discharge status: Home / Self Care | Payer: MEDICARE

## 2017-02-18 DIAGNOSIS — Z006 Encounter for examination for normal comparison and control in clinical research program: Principal | ICD-10-CM

## 2017-02-19 DIAGNOSIS — M791 Myalgia, unspecified site: Secondary | ICD-10-CM | POA: Diagnosis not present

## 2017-02-19 DIAGNOSIS — M5136 Other intervertebral disc degeneration, lumbar region: Secondary | ICD-10-CM | POA: Diagnosis not present

## 2017-02-19 DIAGNOSIS — M545 Low back pain: Secondary | ICD-10-CM | POA: Diagnosis not present

## 2017-03-10 DIAGNOSIS — E1121 Type 2 diabetes mellitus with diabetic nephropathy: Secondary | ICD-10-CM | POA: Diagnosis not present

## 2017-03-10 DIAGNOSIS — Z23 Encounter for immunization: Secondary | ICD-10-CM | POA: Diagnosis not present

## 2017-03-10 DIAGNOSIS — Z6834 Body mass index (BMI) 34.0-34.9, adult: Secondary | ICD-10-CM | POA: Diagnosis not present

## 2017-03-10 DIAGNOSIS — M5106 Intervertebral disc disorders with myelopathy, lumbar region: Secondary | ICD-10-CM | POA: Diagnosis not present

## 2017-03-10 DIAGNOSIS — I10 Essential (primary) hypertension: Secondary | ICD-10-CM | POA: Diagnosis not present

## 2017-03-10 DIAGNOSIS — E782 Mixed hyperlipidemia: Secondary | ICD-10-CM | POA: Diagnosis not present

## 2017-03-11 MED ORDER — USTEKINUMAB 90 MG/ML SUBCUTANEOUS SYRINGE
SUBCUTANEOUS | 5 refills | 0.00000 days | Status: CP
Start: 2017-03-11 — End: 2017-03-11

## 2017-03-11 MED ORDER — EMPTY CONTAINER
2 refills | 0 days
Start: 2017-03-11 — End: 2018-03-11

## 2017-03-11 MED ORDER — USTEKINUMAB 90 MG/ML SUBCUTANEOUS SYRINGE: mL | 5 refills | 0 days | Status: AC

## 2017-03-11 NOTE — Unmapped (Signed)
[facility]   Patient Onboarding/Medication Counseling    Brenda Beard is a 51 y.o. female with Crohn's Disease who I am counseling today on initiation of therapy.    Medication: Stelara    Verified patient's date of birth / HIPAA.      Education Provided: ??    Dose/Administration discussed: Stelara 90mg  SQ every 8 weeks. This medication should be taken  without regard to food.     Storage requirements: this medicine should be stored in the refrigerator.     Side effects discussed: Discussed common side effects, including increased risk of infection. If patient experiences muscle aches, chills, fevers, they need to call the doctor.  Patient will receive a Lexi-Comp drug information handout with shipment.    Handling precautions reviewed:  Patient will dispose of needles in a sharps container or empty laundry detergent bottle.    Drug Interactions: other medications reviewed and up to date in Epic.  No drug interactions identified.    Comorbidities/Allergies: reviewed and up to date in Epic.    Verified therapy is appropriate and should continue      Delivery Information    Anticipated copay of $0.00 reviewed with patient. Verified delivery address in FSI and reviewed medication storage requirement.    Scheduled delivery date: 03/14/17    Explained that we ship using UPS and this shipment will not require a signature.      Explained the services we provide at [facility] and that each month we would call to set up refills.  Stressed importance of returning phone calls so that we could ensure they receive their medications in time each month.  Informed patient that we should be setting up refills 7-10 days prior to when they will run out of medication.  Informed patient that welcome packet will be sent.      Patient verbalized understanding of the above information as well as how to contact the pharmacy at [phone] option 4 with any questions/concerns.        Patient Specific Needs      ? Patient has no physical or cognitive barriers.    ? Patient prefers to have medications discussed with  Patient     ? Patient is able to read and understand education materials at a high school level or above.      Annie Main  [facility] Specialty Student Pharmacist

## 2017-03-11 NOTE — Unmapped (Signed)
PATIENT APPROVED FOR MFG ASSISTANCE FOR STELARA THROUGH [facility]

## 2017-03-11 NOTE — Unmapped (Signed)
The following correspondence was sent to [facility]  Some good news kinda???.    I was informed today that you have a temporary approval for the free drug program for Stelara that is good thru 04/04/17. Apparently, on 10/24 you were mailed paperwork that is some sort of Medicare part D confirmation. You need to complete/sign and return for consideration of long term approval. That is step 1    Step 2: the information you will need to obtain the Stelara from pharmacy is as follows:   ID# [id number]  BIN# [id number]  Grp# [id number]    You already have a prescription on file with [facility]. If you give this information to the pharmacy, they should be able to send you the medicine free of charge BUT YOU WILL HAVE TO PAY SHIPPING CHARGES which are not covered by the plan.     Alternatively, I have also sent a prescription for the Stelara to the [facility] pharmacy along with the pertinent information above. Keeping my fingers crossed that when you are here for your procedures tomorrow you might be able to get your medicine while you are here. If tomorrow doesn???t work out, then just arrange to get the first shipment sent to you from [facility]    In light of the temporary approval given for this calendar year, my assumption is that a longer term approval will carry you through 2019. The only thing that will need to be done after the first of the year is a renewed authorization thru [facility] for the medicine, which will continue to demonstrate the high out of pocket cost for same.    Marlynn Perking

## 2017-03-12 DIAGNOSIS — K5080 Crohn's disease of both small and large intestine without complications: Principal | ICD-10-CM

## 2017-03-13 ENCOUNTER — Ambulatory Visit
Admission: RE | Admit: 2017-03-13 | Discharge: 2017-03-13 | Disposition: A | Discharge status: Home / Self Care | Payer: MEDICARE | Attending: Anesthesiology | Admitting: Anesthesiology

## 2017-03-13 ENCOUNTER — Ambulatory Visit: Admission: RE | Admit: 2017-03-13 | Discharge: 2017-03-13 | Payer: MEDICARE

## 2017-03-13 ENCOUNTER — Ambulatory Visit
Admission: RE | Admit: 2017-03-13 | Discharge: 2017-03-13 | Disposition: A | Discharge status: Home / Self Care | Payer: MEDICARE | Attending: Gastroenterology | Admitting: Gastroenterology

## 2017-03-13 DIAGNOSIS — K5080 Crohn's disease of both small and large intestine without complications: Secondary | ICD-10-CM | POA: Diagnosis not present

## 2017-03-13 DIAGNOSIS — K5730 Diverticulosis of large intestine without perforation or abscess without bleeding: Secondary | ICD-10-CM | POA: Diagnosis not present

## 2017-03-13 DIAGNOSIS — Z880 Allergy status to penicillin: Secondary | ICD-10-CM | POA: Diagnosis not present

## 2017-03-13 DIAGNOSIS — D126 Benign neoplasm of colon, unspecified: Secondary | ICD-10-CM | POA: Diagnosis not present

## 2017-03-13 DIAGNOSIS — D124 Benign neoplasm of descending colon: Secondary | ICD-10-CM | POA: Diagnosis not present

## 2017-03-13 DIAGNOSIS — Z9049 Acquired absence of other specified parts of digestive tract: Secondary | ICD-10-CM | POA: Diagnosis not present

## 2017-03-13 DIAGNOSIS — Z980 Intestinal bypass and anastomosis status: Secondary | ICD-10-CM | POA: Diagnosis not present

## 2017-03-13 DIAGNOSIS — E7800 Pure hypercholesterolemia, unspecified: Secondary | ICD-10-CM | POA: Diagnosis not present

## 2017-03-13 DIAGNOSIS — E119 Type 2 diabetes mellitus without complications: Secondary | ICD-10-CM | POA: Diagnosis not present

## 2017-03-13 DIAGNOSIS — I10 Essential (primary) hypertension: Secondary | ICD-10-CM | POA: Diagnosis not present

## 2017-03-13 DIAGNOSIS — K6389 Other specified diseases of intestine: Secondary | ICD-10-CM | POA: Diagnosis not present

## 2017-03-13 DIAGNOSIS — Z885 Allergy status to narcotic agent status: Secondary | ICD-10-CM | POA: Diagnosis not present

## 2017-03-13 DIAGNOSIS — Z8719 Personal history of other diseases of the digestive system: Secondary | ICD-10-CM | POA: Diagnosis not present

## 2017-03-13 DIAGNOSIS — Z006 Encounter for examination for normal comparison and control in clinical research program: Principal | ICD-10-CM

## 2017-03-13 LAB — HM COLONOSCOPY

## 2017-03-13 MED FILL — SHARPS KIT/NA/MISC: SHARPS KIT/NA/MISC | 120 days supply | Qty: 1 | Fill #0

## 2017-03-13 MED FILL — STELARA/90MG/ML/SOLN: STELARA/90MG/ML/SOLN | 30 days supply | Qty: 1 | Fill #0

## 2017-03-13 NOTE — Unmapped (Signed)
[facility]     01NOV2018 - Week 252 ?? 7     Study Name: A Phase 3, Randomized, Double-Blind, Placebo-Controlled, Parallel-Group, Multicenter Study to Evaluate the Safety and Efficacy of Ustekinumab Maintenance Therapy in Subjects with Moderately to Severely Active Crohn???s Disease (IMUNITI)     Protocol Version: Amendment 3 dated 06AUG2015     Patient ID: [id number]      IRB Protocol Number: 03-2502     Diagnosis: Crohn???s Disease      Reconsenting Process:  Upon arrival, the patient was provided with the ICF [id number].[id number], which  incorporates the Stelara Investigator???s Brochure changes from edition 18 to edition 19. The following changes were reviewed with the patient, including but not limited to:  ?? The risks or discomforts involved with being in the study  I thoroughly reviewed the overall study and its changes with the patient. Furthermore, I reviewed with the patient all elements of the ICF, who was given the opportunity to discuss the study and to make an independent decision. The patient had time to read through the ICF and was given the opportunity to ask questions. The patient stated satisfaction to the answers and explanations provided and had no further questions. The patient agreed to the changes presented and voluntarily agreed to continue participating in the study. The patient provided voluntary verbal consent, followed by voluntary written consent. I provided the patient with a copy of the signed and counter-signed ICF. Not study related procedures were performed prior to signing the ICFs. Dr. Erma Heritage discussed alternate methods of treatment and was present to answer questions.    Investigator Assessments:  Dr. Cedric Fishman was present to perform a detailed physical exam. Dr. Erma Heritage also reviewed the patient???s diary entries.      Clinical Research Coordinator (CRC) Assessments:   CRC Patrizia Paule Karie Chimera conducted the Week 252 visit.  Vital Signs: Vital signs were collected by the [facility] nurse.  Concomitant Medications: The patient???s medications were reviewed, and the participant stated that since the last visit, all of her medications were stable and that there were no changes.  Adverse Events (AEs): The patient did not report any new AEs, hospitalizations, or surgeries. The following AEs are ongoing at the end of visit:   ?? Hemorrhoid start date 15JAN2015  ?? Glucose Intolerance start date 03FEB2014  ?? Worsening Arthralgia start date 28MAR2016  ?? Back pain start date [id number]  ?? Rectal pressure start date 10SEP2018  ?? Left knee pain 06OCT2018     Questionnaires:  Questionnaires were completed by the patient after reconsenting and prior to any study procedures. The following questionnaires were completed:  ?? IBDQ  ?? SF-36  ?? Productivity Scale and Time Lost from Work    Laboratory Assessments:  Urine Pregnancy Test: Not required as patient is a female of not child bearing potential.   Blood Collection: Blood was collected by [facility] nurse at 07:39 for labs. All samples were processed and shipped on the same day.     IP Administration:  Patient???s dosing regimen in the study is Q8Weeks and since the patient???s last IP dose was on 09OCT2018 during Week 248, her next dose should be on Tuesday, 04DEC2018. The patient is aware that a final safety follow up visit will be conducted on 03DEC2018 prior to her dosing Stelara commercially. In addition, the patient stated that she has had previous experience self-administering Vitamin B12 and Methotrexate, and that she feels confident self-administering commercial Stelara the next time that she  is due for her dosing on 04DEC2018. This plan was reviewed with Dr. Erma Heritage, and Dr. Erma Heritage was in agreement.    Diary Collection:  The patient has completed her diary for 6 days prior to her study visit.      Issues:  Since the patient was scheduled for a standard of care colonoscopy on 01NOV2018, the patient underwent bowel preparation on 31OCT2018. As per advice from CRA, Jetta Lout, data from prep day (31OCT2018) was omitted by the patient. Therefore, the CDAI score is based on 6 days. The CDAI score calculated by ClinPhone was 41, which does not match the CDAI calculated by hand on the worksheet (38).     Notes:  Patient is to return for Week 256 on 03DEC2018 at 09:30 at the [facility] for a final safety follow up visit. This will be the last study visit for the patient. The patient will then dose at home from commercial Stelara. Patient verbalized understanding.     Terriyah Westra Karie Chimera - IBD Clinical Research Coordinator  [facility] - [address] -    CB# [id number] - [address] [employer] [place], [place] [address]  Mobile: [phone] - Office: [phone] - Fax: [phone]

## 2017-03-24 NOTE — Unmapped (Signed)
Spoke to sherri at [employer] - pt assistance approval is in another appeal due to pt income being $600 too high. For appeal, they are requesting a letter indicating [place] and Dr. Erma Heritage will not charge pt medical benefit for Stelara injections.

## 2017-03-26 NOTE — Unmapped (Signed)
Letter sent to [employer] as requested today 03/26/17

## 2017-04-07 NOTE — Unmapped (Signed)
Spent on hold with [employer] and [employer] to f/u on stelara appeal. Did not get to speak with anyone. Will try again

## 2017-04-08 DIAGNOSIS — M25561 Pain in right knee: Secondary | ICD-10-CM | POA: Diagnosis not present

## 2017-04-08 DIAGNOSIS — M170 Bilateral primary osteoarthritis of knee: Secondary | ICD-10-CM | POA: Diagnosis not present

## 2017-04-08 DIAGNOSIS — M25562 Pain in left knee: Secondary | ICD-10-CM | POA: Diagnosis not present

## 2017-04-09 NOTE — Unmapped (Signed)
Pt approved for Stelara pt assistance from April 09 2017 -  May 12 2017.   Her pharmacy card info is [place]: [id number] ID: [id number] Group: [id number]. Prescription is at [facility] shared services.

## 2017-04-10 DIAGNOSIS — K5901 Slow transit constipation: Secondary | ICD-10-CM | POA: Diagnosis not present

## 2017-04-10 DIAGNOSIS — E782 Mixed hyperlipidemia: Secondary | ICD-10-CM | POA: Diagnosis not present

## 2017-04-10 DIAGNOSIS — I10 Essential (primary) hypertension: Secondary | ICD-10-CM | POA: Diagnosis not present

## 2017-04-10 DIAGNOSIS — Z6834 Body mass index (BMI) 34.0-34.9, adult: Secondary | ICD-10-CM | POA: Diagnosis not present

## 2017-04-14 ENCOUNTER — Ambulatory Visit: Admission: RE | Admit: 2017-04-14 | Discharge: 2017-04-14

## 2017-04-14 DIAGNOSIS — Z006 Encounter for examination for normal comparison and control in clinical research program: Principal | ICD-10-CM

## 2017-04-14 NOTE — Unmapped (Signed)
[facility] Refill and Clinical Coordination Note  Medication(s): Brenda Beard, DOB: 06-Aug-1965  Phone: [phone] (home) , Alternate phone contact: N/A  Shipping address: [address]  [place] [address]  Phone or address changes today?: No  All above HIPAA information verified.  Insurance changes? No    Completed refill and clinical call assessment today to schedule patient's medication shipment from the [facility] [phone]).      MEDICATION RECONCILIATION    Confirmed the medication and dosage are correct and have not changed: Yes, regimen is correct and unchanged.    Were there any changes to your medication(s) in the past month:  No, there are no changes reported at this time.    ADHERENCE    Is this medicine transplant or covered by Medicare Part B? No.    Did you miss any doses in the past 4 weeks? No missed doses reported.  Adherence counseling provided? Not needed     SIDE EFFECT MANAGEMENT    Are you tolerating your medication?:  Brenda Beard reports tolerating the medication.  Side effect management discussed: None      Therapy is appropriate and should be continued.    Evidence of clinical benefit: See Epic note from 03/13/17      FINANCIAL/SHIPPING    Delivery Scheduled: Yes, Expected medication delivery date: Tues, Dec 18   Additional medications refilled: No additional medications/refills needed at this time.    Brenda Beard did not have any additional questions at this time.    Delivery address validated in FSI scheduling system: Yes, address listed above is correct.      We will follow up with patient monthly for standard refill processing and delivery.      Thank you,  Tawanna Solo [facility] Specialty Pharmacist

## 2017-04-14 NOTE — Unmapped (Signed)
[facility]     03DEC2018 - Week 256 - Final Safety Visit     Study Name: A Phase 3, Randomized, Double-Blind, Placebo-Controlled, Parallel-Group, Multicenter Study to Evaluate the Safety and Efficacy of Ustekinumab Maintenance Therapy in Subjects with Moderately to Severely Active Crohn???s Disease (IMUNITI)     Protocol Version: Amendment 3 dated 06AUG2015     Patient ID: [id number]      IRB Protocol Number: 03-2502     Diagnosis: Crohn???s Disease    Investigator Assessments:  Dr. Cedric Fishman was present to perform a detailed physical exam. Dr. Erma Heritage reviewed the patient???s diary entries.      Clinical Research Coordinator (CRC) Assessments:    CRC Alin Chavira Karie Chimera conducted the Week 256 visit.  Vital Signs: Vital signs were collected by the [facility] nurse.  Concomitant Medications: The patient???s medications were reviewed, and the participant stated that since the last visit, the patient exhibited the following medication changes:  ?? Hyaluronic Acid - IM - Once - 27NOV2018 - Indication: Worsening Arthralgia knees  The patient stated that there were no further changes in her medications.  Adverse Events (AEs):  The patient reported the following changes in AEs:  ?? Hemorrhoid 15JAN2015 to [id number]  The patient did not report any new AEs, hospitalizations, or surgeries. The following AEs are ongoing at the end of visit:   ?? Glucose Intolerance start date 03FEB2014  ?? Worsening Arthralgia start date 28MAR2016  ?? Back pain start date [id number]  ?? Rectal pressure start date 10SEP2018  ?? Left knee pain 06OCT2018  ClinPhone: This visit was registered on ClinPhone under safety follow up visit.    Laboratory Assessments:  Urine Pregnancy Test: Not required as patient is a female of not child bearing potential.   Blood Collection: Blood was collected by [facility] nurse at 10:27 for labs. All samples were processed and shipped on the same day.     IP Administration:  Patient???s dosing regimen in the study is Q8Weeks and since the patient???s last IP dose was on 09OCT2018, her next dose should be on Tuesday, 04DEC2018. However, Dr. Erma Heritage stated that the patient could dose on commercial Stelara today instead of tomorrow. Patient stated that she will self-administer commercial Stelara once she returns home from today???s visit.    Diary Collection:  The patient has completed her diary for 7 days prior to her study visit.      Notes:  Patient has completed the study. I thanked her for her participation and commitment to the research study. Patient will follow up with Dr. Erma Heritage in the GI clinic for continuity of care. Dr. Erma Heritage asked the patient to contact the GI Clinic scheduling personnel to schedule an appointment for [id number] or sooner if the patient becomes symptomatic. The patient verbalized understanding.     Mitsuye Schrodt Karie Chimera - IBD Clinical Research Coordinator  [facility] - [address] -    CB# [id number] - [address] [employer] [place], [place] [address]  Mobile: [phone] - Office: [phone] - Fax: [phone]

## 2017-04-15 DIAGNOSIS — I10 Essential (primary) hypertension: Secondary | ICD-10-CM | POA: Diagnosis not present

## 2017-04-16 DIAGNOSIS — M25562 Pain in left knee: Secondary | ICD-10-CM | POA: Diagnosis not present

## 2017-04-16 DIAGNOSIS — M1712 Unilateral primary osteoarthritis, left knee: Secondary | ICD-10-CM | POA: Diagnosis not present

## 2017-04-22 DIAGNOSIS — M1711 Unilateral primary osteoarthritis, right knee: Secondary | ICD-10-CM | POA: Diagnosis not present

## 2017-04-22 DIAGNOSIS — M25561 Pain in right knee: Secondary | ICD-10-CM | POA: Diagnosis not present

## 2017-04-27 MED FILL — STELARA/90MG/ML/SOLN: STELARA/90MG/ML/SOLN | 30 days supply | Qty: 1 | Fill #1

## 2017-04-30 DIAGNOSIS — M25562 Pain in left knee: Secondary | ICD-10-CM | POA: Diagnosis not present

## 2017-04-30 DIAGNOSIS — M1712 Unilateral primary osteoarthritis, left knee: Secondary | ICD-10-CM | POA: Diagnosis not present

## 2017-05-01 MED ORDER — USTEKINUMAB 90 MG/ML SUBCUTANEOUS SYRINGE: mL | 5 refills | 0 days | Status: AC

## 2017-05-01 MED ORDER — USTEKINUMAB 90 MG/ML SUBCUTANEOUS SYRINGE
SUBCUTANEOUS | 5 refills | 0.00000 days | Status: CP
Start: 2017-05-01 — End: 2017-05-01

## 2017-05-01 NOTE — Unmapped (Signed)
Pt must renew stelara MFR assistance for stelara. Per [facility] Staff, new Stelara script sent to [facility] shared services. Will f/u with [facility] Staff

## 2017-05-02 DIAGNOSIS — Z01419 Encounter for gynecological examination (general) (routine) without abnormal findings: Secondary | ICD-10-CM | POA: Diagnosis not present

## 2017-05-02 DIAGNOSIS — Z1231 Encounter for screening mammogram for malignant neoplasm of breast: Secondary | ICD-10-CM | POA: Diagnosis not present

## 2017-05-02 DIAGNOSIS — Z6832 Body mass index (BMI) 32.0-32.9, adult: Secondary | ICD-10-CM | POA: Diagnosis not present

## 2017-05-02 MED ORDER — ALPRAZOLAM 0.5 MG TABLET
ORAL_TABLET | 1 refills | 0 days | Status: CP
Start: 2017-05-02 — End: 2017-09-19

## 2017-05-02 NOTE — Unmapped (Signed)
Per test claim for Stelara at the [facility], patient needs Medication Assistance Program for High Copay.

## 2017-05-07 DIAGNOSIS — M25561 Pain in right knee: Secondary | ICD-10-CM | POA: Diagnosis not present

## 2017-05-07 DIAGNOSIS — M1711 Unilateral primary osteoarthritis, right knee: Secondary | ICD-10-CM | POA: Diagnosis not present

## 2017-05-22 DIAGNOSIS — M25561 Pain in right knee: Secondary | ICD-10-CM | POA: Diagnosis not present

## 2017-05-22 DIAGNOSIS — M25562 Pain in left knee: Secondary | ICD-10-CM | POA: Diagnosis not present

## 2017-05-22 DIAGNOSIS — M170 Bilateral primary osteoarthritis of knee: Secondary | ICD-10-CM | POA: Diagnosis not present

## 2017-05-23 DIAGNOSIS — M5116 Intervertebral disc disorders with radiculopathy, lumbar region: Secondary | ICD-10-CM | POA: Diagnosis not present

## 2017-05-23 DIAGNOSIS — I10 Essential (primary) hypertension: Secondary | ICD-10-CM | POA: Diagnosis not present

## 2017-05-23 DIAGNOSIS — M961 Postlaminectomy syndrome, not elsewhere classified: Secondary | ICD-10-CM | POA: Diagnosis not present

## 2017-05-23 DIAGNOSIS — M4726 Other spondylosis with radiculopathy, lumbar region: Secondary | ICD-10-CM | POA: Diagnosis not present

## 2017-05-29 ENCOUNTER — Other Ambulatory Visit: Payer: Self-pay | Admitting: Orthopedic Surgery

## 2017-05-29 DIAGNOSIS — M4726 Other spondylosis with radiculopathy, lumbar region: Secondary | ICD-10-CM

## 2017-06-10 ENCOUNTER — Ambulatory Visit
Admission: RE | Admit: 2017-06-10 | Discharge: 2017-06-10 | Disposition: A | Discharge status: Home / Self Care | Payer: Commercial Managed Care - HMO | Source: Ambulatory Visit | Attending: Orthopedic Surgery | Admitting: Orthopedic Surgery

## 2017-06-10 DIAGNOSIS — E1121 Type 2 diabetes mellitus with diabetic nephropathy: Secondary | ICD-10-CM | POA: Diagnosis not present

## 2017-06-10 DIAGNOSIS — E668 Other obesity: Secondary | ICD-10-CM | POA: Diagnosis not present

## 2017-06-10 DIAGNOSIS — M48061 Spinal stenosis, lumbar region without neurogenic claudication: Secondary | ICD-10-CM | POA: Diagnosis not present

## 2017-06-10 DIAGNOSIS — M4726 Other spondylosis with radiculopathy, lumbar region: Secondary | ICD-10-CM

## 2017-06-10 DIAGNOSIS — K5090 Crohn's disease, unspecified, without complications: Secondary | ICD-10-CM | POA: Diagnosis not present

## 2017-06-10 DIAGNOSIS — M5441 Lumbago with sciatica, right side: Secondary | ICD-10-CM | POA: Diagnosis not present

## 2017-06-10 DIAGNOSIS — Z6837 Body mass index (BMI) 37.0-37.9, adult: Secondary | ICD-10-CM | POA: Diagnosis not present

## 2017-06-10 DIAGNOSIS — E782 Mixed hyperlipidemia: Secondary | ICD-10-CM | POA: Diagnosis not present

## 2017-06-10 DIAGNOSIS — I10 Essential (primary) hypertension: Secondary | ICD-10-CM | POA: Diagnosis not present

## 2017-06-10 DIAGNOSIS — F320 Major depressive disorder, single episode, mild: Secondary | ICD-10-CM | POA: Diagnosis not present

## 2017-06-10 NOTE — Unmapped (Signed)
Patient still awaiting manufacturer assistance program to accept or deny application for financial assistance.  Rescheduling outreach call date to touch base and see where things stand in 7-10 days from now.

## 2017-06-13 DIAGNOSIS — M961 Postlaminectomy syndrome, not elsewhere classified: Secondary | ICD-10-CM | POA: Diagnosis not present

## 2017-06-13 DIAGNOSIS — M48062 Spinal stenosis, lumbar region with neurogenic claudication: Secondary | ICD-10-CM | POA: Diagnosis not present

## 2017-06-13 DIAGNOSIS — M5431 Sciatica, right side: Secondary | ICD-10-CM | POA: Diagnosis not present

## 2017-06-13 DIAGNOSIS — M4726 Other spondylosis with radiculopathy, lumbar region: Secondary | ICD-10-CM | POA: Diagnosis not present

## 2017-06-23 NOTE — Unmapped (Signed)
As of 06/23/17, patient still hasn't heard from [facility] manufacturer assistance program.  Will reschedule outreach calls to check in next week again.

## 2017-07-01 NOTE — Unmapped (Signed)
[facility] Pharmacy Refill and Clinical Coordination Note  Medication(s): Stelara 90mg /ml    Brenda Beard, DOB: August 06, 1965  Phone: [phone] (home) , Alternate phone contact: N/A  Shipping address: [address]  [place] [address]  Phone or address changes today?: No  All above HIPAA information verified.  Insurance changes? No    Completed refill and clinical call assessment today to schedule patient's medication shipment from the [facility] [phone]).      MEDICATION RECONCILIATION    Confirmed the medication and dosage are correct and have not changed: Yes, regimen is correct and unchanged.    Were there any changes to your medication(s) in the past month:  No, there are no changes reported at this time.    ADHERENCE    Is this medicine transplant or covered by Medicare Part B? No.    Did you miss any doses in the past 4 weeks? No missed doses reported.  Adherence counseling provided? Not needed     SIDE EFFECT MANAGEMENT    Are you tolerating your medication?:  Claritza reports tolerating the medication.  Side effect management discussed: None      Therapy is appropriate and should be continued.    Evidence of clinical benefit: See Epic note from 03/11/17      FINANCIAL/SHIPPING    Delivery Scheduled: Yes, Expected medication delivery date: 07/09/17   Additional medications refilled: No additional medications/refills needed at this time.    The patient will receive an FSI print out for each medication shipped and additional FDA Medication Guides as required.  Patient education from [place] or Robet Leu may also be included in the shipment.    Desha did not have any additional questions at this time.    Delivery address validated in FSI scheduling system: Yes, address listed above is correct.      We will follow up with patient monthly for standard refill processing and delivery.      Thank you,  Shuna Tabor  Anders Grant   [facility] Specialty Pharmacist

## 2017-07-07 DIAGNOSIS — K5090 Crohn's disease, unspecified, without complications: Secondary | ICD-10-CM | POA: Diagnosis not present

## 2017-07-07 DIAGNOSIS — N3000 Acute cystitis without hematuria: Secondary | ICD-10-CM | POA: Diagnosis not present

## 2017-07-07 DIAGNOSIS — M5106 Intervertebral disc disorders with myelopathy, lumbar region: Secondary | ICD-10-CM | POA: Diagnosis not present

## 2017-07-07 DIAGNOSIS — E782 Mixed hyperlipidemia: Secondary | ICD-10-CM | POA: Diagnosis not present

## 2017-07-07 DIAGNOSIS — R82998 Other abnormal findings in urine: Secondary | ICD-10-CM | POA: Diagnosis not present

## 2017-07-07 DIAGNOSIS — Z7901 Long term (current) use of anticoagulants: Secondary | ICD-10-CM | POA: Diagnosis not present

## 2017-07-07 DIAGNOSIS — Z01818 Encounter for other preprocedural examination: Secondary | ICD-10-CM | POA: Diagnosis not present

## 2017-07-07 DIAGNOSIS — Z01810 Encounter for preprocedural cardiovascular examination: Secondary | ICD-10-CM | POA: Diagnosis not present

## 2017-07-07 DIAGNOSIS — E1121 Type 2 diabetes mellitus with diabetic nephropathy: Secondary | ICD-10-CM | POA: Diagnosis not present

## 2017-07-08 DIAGNOSIS — M5441 Lumbago with sciatica, right side: Secondary | ICD-10-CM | POA: Diagnosis not present

## 2017-07-08 DIAGNOSIS — M5489 Other dorsalgia: Secondary | ICD-10-CM | POA: Diagnosis not present

## 2017-07-14 DIAGNOSIS — Z4689 Encounter for fitting and adjustment of other specified devices: Secondary | ICD-10-CM | POA: Diagnosis not present

## 2017-07-14 DIAGNOSIS — M961 Postlaminectomy syndrome, not elsewhere classified: Secondary | ICD-10-CM | POA: Diagnosis not present

## 2017-07-14 DIAGNOSIS — M545 Low back pain: Secondary | ICD-10-CM | POA: Diagnosis not present

## 2017-07-14 DIAGNOSIS — M4726 Other spondylosis with radiculopathy, lumbar region: Secondary | ICD-10-CM | POA: Diagnosis not present

## 2017-07-14 DIAGNOSIS — M48062 Spinal stenosis, lumbar region with neurogenic claudication: Secondary | ICD-10-CM | POA: Diagnosis not present

## 2017-07-14 DIAGNOSIS — M5431 Sciatica, right side: Secondary | ICD-10-CM | POA: Diagnosis not present

## 2017-07-16 DIAGNOSIS — M5441 Lumbago with sciatica, right side: Secondary | ICD-10-CM | POA: Diagnosis not present

## 2017-07-16 DIAGNOSIS — M5489 Other dorsalgia: Secondary | ICD-10-CM | POA: Diagnosis not present

## 2017-07-17 DIAGNOSIS — R5383 Other fatigue: Secondary | ICD-10-CM | POA: Diagnosis not present

## 2017-07-17 DIAGNOSIS — M5136 Other intervertebral disc degeneration, lumbar region: Secondary | ICD-10-CM

## 2017-07-17 DIAGNOSIS — N959 Unspecified menopausal and perimenopausal disorder: Secondary | ICD-10-CM | POA: Insufficient documentation

## 2017-07-17 DIAGNOSIS — R011 Cardiac murmur, unspecified: Secondary | ICD-10-CM | POA: Diagnosis not present

## 2017-07-17 DIAGNOSIS — K5090 Crohn's disease, unspecified, without complications: Secondary | ICD-10-CM | POA: Diagnosis not present

## 2017-07-17 DIAGNOSIS — M51369 Other intervertebral disc degeneration, lumbar region without mention of lumbar back pain or lower extremity pain: Secondary | ICD-10-CM

## 2017-07-17 DIAGNOSIS — Z01812 Encounter for preprocedural laboratory examination: Secondary | ICD-10-CM | POA: Diagnosis not present

## 2017-07-17 DIAGNOSIS — M2550 Pain in unspecified joint: Secondary | ICD-10-CM | POA: Insufficient documentation

## 2017-07-17 DIAGNOSIS — M5116 Intervertebral disc disorders with radiculopathy, lumbar region: Secondary | ICD-10-CM | POA: Diagnosis not present

## 2017-07-17 DIAGNOSIS — R102 Pelvic and perineal pain unspecified side: Secondary | ICD-10-CM | POA: Insufficient documentation

## 2017-07-17 DIAGNOSIS — M4726 Other spondylosis with radiculopathy, lumbar region: Secondary | ICD-10-CM | POA: Diagnosis not present

## 2017-07-17 DIAGNOSIS — N938 Other specified abnormal uterine and vaginal bleeding: Secondary | ICD-10-CM | POA: Insufficient documentation

## 2017-07-17 HISTORY — DX: Other intervertebral disc degeneration, lumbar region: M51.36

## 2017-07-17 HISTORY — DX: Other intervertebral disc degeneration, lumbar region without mention of lumbar back pain or lower extremity pain: M51.369

## 2017-07-23 DIAGNOSIS — M4326 Fusion of spine, lumbar region: Secondary | ICD-10-CM | POA: Diagnosis not present

## 2017-07-23 DIAGNOSIS — M5431 Sciatica, right side: Secondary | ICD-10-CM | POA: Diagnosis not present

## 2017-07-23 DIAGNOSIS — M4726 Other spondylosis with radiculopathy, lumbar region: Secondary | ICD-10-CM | POA: Diagnosis not present

## 2017-07-23 DIAGNOSIS — M5106 Intervertebral disc disorders with myelopathy, lumbar region: Secondary | ICD-10-CM | POA: Diagnosis not present

## 2017-07-23 DIAGNOSIS — E119 Type 2 diabetes mellitus without complications: Secondary | ICD-10-CM | POA: Diagnosis not present

## 2017-07-23 DIAGNOSIS — M48062 Spinal stenosis, lumbar region with neurogenic claudication: Secondary | ICD-10-CM | POA: Diagnosis not present

## 2017-07-23 DIAGNOSIS — M961 Postlaminectomy syndrome, not elsewhere classified: Secondary | ICD-10-CM | POA: Diagnosis not present

## 2017-07-23 DIAGNOSIS — Z981 Arthrodesis status: Secondary | ICD-10-CM | POA: Diagnosis not present

## 2017-07-23 DIAGNOSIS — Z9049 Acquired absence of other specified parts of digestive tract: Secondary | ICD-10-CM | POA: Diagnosis not present

## 2017-07-23 DIAGNOSIS — M5117 Intervertebral disc disorders with radiculopathy, lumbosacral region: Secondary | ICD-10-CM | POA: Diagnosis not present

## 2017-07-23 DIAGNOSIS — G709 Myoneural disorder, unspecified: Secondary | ICD-10-CM | POA: Diagnosis not present

## 2017-07-23 DIAGNOSIS — M4807 Spinal stenosis, lumbosacral region: Secondary | ICD-10-CM | POA: Diagnosis not present

## 2017-07-29 ENCOUNTER — Other Ambulatory Visit: Payer: Self-pay

## 2017-07-29 NOTE — Patient Outreach (Signed)
[facility]) Care Management  07/29/2017  Brittany [name] 01-Jun-1965 [id number]   Referral Date: 07/29/17 Referral Source: [employer] Report Date of Admission: 07/23/17 Diagnosis: Post Laminectomy syndrome of lumber region Date of Discharge: 07/25/17 Facility: [place]: [place] attempt # 1 Telephone call to patient for transition of care follow up.  Patient reports she is doing good since being at home reports some soreness.  Patient does not have home health as it was not recommended by her physicians assistant until she sees the orthopedic.  Marland Kitchen She states that [facility] called and she advised them of this. Patient feels she is doing good with the exercises given at the hospital to do at home.  Social: Patient reports that she lives alone but has friends available to help her as she recovers.  Patient is independent with ADL's.   Conditions: Patient with long history of back problems.  She states she had surgery recently and is recovering doing her exercises.      Medications: Patient able to review medications and has no questions.  Patient does say that she is in the process of patient assistance for her stelara.   Appointments: Patient has orthopedic appointment on April 11th.  Patient states she will being seeing her primary care some time next month as well.  Consent: RN CM reviewed [facility] services with patient. Patient declined services at this time  Plan: RN CM will send letter and brochure. RN CM will close case and notify care management assistant of case status.     Jone Baseman, RN, MSN [facility] Care Management Care Management Coordinator Direct Line [phone] Toll Free: [phone]  Fax: [phone]

## 2017-07-30 NOTE — Unmapped (Unsigned)
Manufacturer assistance denied for Stelara. Patient must spend 4% of her total income. Anticipated copay with [employer] is 2263.05. All forms of financial assistance have been exhausted.

## 2017-07-30 NOTE — Unmapped (Signed)
Patient was denied for Stelara manufacturer assistance program - can not afford medication.  No other options for copay assistance.  Advised patient to reach out to the clinic to go over next steps for their treatment options.    Patient indicated they will not be moving forward with the medication at this current time due to the situation.  Will be unenrolling from specialty pharmacy outreach calls at this point.

## 2017-08-12 ENCOUNTER — Institutional Professional Consult (permissible substitution): Admit: 2017-08-12 | Discharge: 2017-08-13 | Payer: MEDICARE | Attending: Gastroenterology | Primary: Gastroenterology

## 2017-08-12 DIAGNOSIS — K50919 Crohn's disease, unspecified, with unspecified complications: Secondary | ICD-10-CM | POA: Diagnosis not present

## 2017-08-18 DIAGNOSIS — M25561 Pain in right knee: Secondary | ICD-10-CM | POA: Diagnosis not present

## 2017-08-18 DIAGNOSIS — M5441 Lumbago with sciatica, right side: Secondary | ICD-10-CM | POA: Diagnosis not present

## 2017-08-18 DIAGNOSIS — E668 Other obesity: Secondary | ICD-10-CM | POA: Diagnosis not present

## 2017-08-21 DIAGNOSIS — M545 Low back pain: Secondary | ICD-10-CM | POA: Diagnosis not present

## 2017-09-19 DIAGNOSIS — I10 Essential (primary) hypertension: Secondary | ICD-10-CM | POA: Diagnosis not present

## 2017-09-19 DIAGNOSIS — E1121 Type 2 diabetes mellitus with diabetic nephropathy: Secondary | ICD-10-CM | POA: Diagnosis not present

## 2017-09-19 DIAGNOSIS — E782 Mixed hyperlipidemia: Secondary | ICD-10-CM | POA: Diagnosis not present

## 2017-09-19 DIAGNOSIS — E668 Other obesity: Secondary | ICD-10-CM | POA: Diagnosis not present

## 2017-09-19 DIAGNOSIS — M5106 Intervertebral disc disorders with myelopathy, lumbar region: Secondary | ICD-10-CM | POA: Diagnosis not present

## 2017-09-19 DIAGNOSIS — Z6831 Body mass index (BMI) 31.0-31.9, adult: Secondary | ICD-10-CM | POA: Diagnosis not present

## 2017-09-19 MED ORDER — ALPRAZOLAM 0.5 MG TABLET
ORAL_TABLET | Freq: Every evening | ORAL | 1 refills | 0 days | Status: CP | PRN
Start: 2017-09-19 — End: 2017-09-22

## 2017-09-22 MED ORDER — ALPRAZOLAM 0.5 MG TABLET
ORAL_TABLET | Freq: Every evening | ORAL | 1 refills | 0.00000 days | PRN
Start: 2017-09-22 — End: 2017-10-14

## 2017-10-14 ENCOUNTER — Ambulatory Visit: Admit: 2017-10-14 | Discharge: 2017-10-15 | Payer: MEDICARE | Attending: Gastroenterology | Primary: Gastroenterology

## 2017-10-14 DIAGNOSIS — E119 Type 2 diabetes mellitus without complications: Secondary | ICD-10-CM | POA: Diagnosis not present

## 2017-10-14 DIAGNOSIS — K50919 Crohn's disease, unspecified, with unspecified complications: Secondary | ICD-10-CM | POA: Diagnosis not present

## 2017-10-14 DIAGNOSIS — M5136 Other intervertebral disc degeneration, lumbar region: Secondary | ICD-10-CM | POA: Diagnosis not present

## 2017-10-14 DIAGNOSIS — Z880 Allergy status to penicillin: Secondary | ICD-10-CM | POA: Diagnosis not present

## 2017-10-14 DIAGNOSIS — E7800 Pure hypercholesterolemia, unspecified: Secondary | ICD-10-CM | POA: Diagnosis not present

## 2017-10-14 DIAGNOSIS — Z90710 Acquired absence of both cervix and uterus: Secondary | ICD-10-CM | POA: Diagnosis not present

## 2017-10-14 DIAGNOSIS — K5080 Crohn's disease of both small and large intestine without complications: Secondary | ICD-10-CM | POA: Diagnosis not present

## 2017-10-14 DIAGNOSIS — Z87891 Personal history of nicotine dependence: Secondary | ICD-10-CM | POA: Diagnosis not present

## 2017-10-14 DIAGNOSIS — I10 Essential (primary) hypertension: Secondary | ICD-10-CM | POA: Diagnosis not present

## 2017-10-14 DIAGNOSIS — Z9049 Acquired absence of other specified parts of digestive tract: Secondary | ICD-10-CM | POA: Diagnosis not present

## 2017-10-14 MED ORDER — ALPRAZOLAM 0.5 MG TABLET
ORAL_TABLET | Freq: Every evening | ORAL | 5 refills | 0 days | Status: CP | PRN
Start: 2017-10-14 — End: 2017-11-03

## 2017-10-21 DIAGNOSIS — M4326 Fusion of spine, lumbar region: Secondary | ICD-10-CM | POA: Diagnosis not present

## 2017-10-21 DIAGNOSIS — M961 Postlaminectomy syndrome, not elsewhere classified: Secondary | ICD-10-CM | POA: Diagnosis not present

## 2017-10-22 DIAGNOSIS — L720 Epidermal cyst: Secondary | ICD-10-CM | POA: Diagnosis not present

## 2017-10-22 DIAGNOSIS — B079 Viral wart, unspecified: Secondary | ICD-10-CM | POA: Diagnosis not present

## 2017-10-22 DIAGNOSIS — C44729 Squamous cell carcinoma of skin of left lower limb, including hip: Secondary | ICD-10-CM | POA: Diagnosis not present

## 2017-10-22 DIAGNOSIS — L820 Inflamed seborrheic keratosis: Secondary | ICD-10-CM | POA: Diagnosis not present

## 2017-10-28 DIAGNOSIS — L720 Epidermal cyst: Secondary | ICD-10-CM | POA: Diagnosis not present

## 2017-11-03 MED ORDER — ALPRAZOLAM 0.5 MG TABLET
ORAL_TABLET | Freq: Three times a day (TID) | ORAL | 2 refills | 0 days | Status: CP | PRN
Start: 2017-11-03 — End: 2018-04-28

## 2017-11-19 NOTE — Unmapped (Signed)
Message from [facility] that pt is now approved for mfr assistance for Stelara 90mg /mL every 8 weeks with Laural Benes and Laural Benes after submitting her [employer] for [place] in clinic with RN of over 2000.00. The approval will last until Dec 2019. [place] Staff will call pt.

## 2017-11-19 NOTE — Unmapped (Signed)
[facility] Refill and Clinical Coordination Note  Medication(s): Stelara 90mg /ml    Brenda Beard, DOB: 1965-11-14  Phone: [phone] (home) , Alternate phone contact: N/A  Shipping address: [address]  [place] [address]  Phone or address changes today?: No  All above HIPAA information verified.  Insurance changes? No    Completed refill and clinical call assessment today to schedule patient's medication shipment from the [facility] [phone]).      MEDICATION RECONCILIATION    Confirmed the medication and dosage are correct and have not changed: Yes, regimen is correct and unchanged.    Were there any changes to your medication(s) in the past month:  No, there are no changes reported at this time.    ADHERENCE    Is this medicine transplant or covered by Medicare Part B? No.    Did you miss any doses in the past 4 weeks? No missed doses reported.  Adherence counseling provided? Not needed     SIDE EFFECT MANAGEMENT    Are you tolerating your medication?:  Brenda Beard reports tolerating the medication.  Side effect management discussed: None      Therapy is appropriate and should be continued.    Evidence of clinical benefit: See Epic note from 10/24/17      FINANCIAL/SHIPPING    Delivery Scheduled: Yes, Expected medication delivery date: 11/21/17   Additional medications refilled: No additional medications/refills needed at this time.    The patient will receive an FSI print out for each medication shipped and additional FDA Medication Guides as required.  Patient education from [place] or Robet Leu may also be included in the shipment.    [name] did not have any additional questions at this time.    Delivery address validated in FSI scheduling system: Yes, address listed above is correct.      We will follow up with patient monthly for standard refill processing and delivery.      Thank you,  Lupita Shutter   [facility] Specialty Pharmacist

## 2017-11-20 MED FILL — STELARA/90MG/ML/SOLN: STELARA/90MG/ML/SOLN | 30 days supply | Qty: 1 | Fill #0

## 2017-11-28 DIAGNOSIS — C44729 Squamous cell carcinoma of skin of left lower limb, including hip: Secondary | ICD-10-CM | POA: Diagnosis not present

## 2017-12-03 MED ORDER — CIPROFLOXACIN 500 MG TABLET
ORAL_TABLET | Freq: Two times a day (BID) | ORAL | 0 refills | 0.00000 days | Status: CP
Start: 2017-12-03 — End: 2017-12-13

## 2017-12-16 MED ORDER — CIPROFLOXACIN 500 MG TABLET
ORAL_TABLET | Freq: Two times a day (BID) | ORAL | 2 refills | 0 days | Status: CP
Start: 2017-12-16 — End: 2017-12-26

## 2017-12-23 DIAGNOSIS — G894 Chronic pain syndrome: Secondary | ICD-10-CM | POA: Diagnosis not present

## 2017-12-23 DIAGNOSIS — M545 Low back pain: Secondary | ICD-10-CM | POA: Diagnosis not present

## 2017-12-23 DIAGNOSIS — Z79899 Other long term (current) drug therapy: Secondary | ICD-10-CM | POA: Diagnosis not present

## 2017-12-23 DIAGNOSIS — M5106 Intervertebral disc disorders with myelopathy, lumbar region: Secondary | ICD-10-CM | POA: Diagnosis not present

## 2017-12-23 DIAGNOSIS — M4326 Fusion of spine, lumbar region: Secondary | ICD-10-CM | POA: Diagnosis not present

## 2017-12-23 DIAGNOSIS — Z79891 Long term (current) use of opiate analgesic: Secondary | ICD-10-CM | POA: Diagnosis not present

## 2017-12-30 DIAGNOSIS — E782 Mixed hyperlipidemia: Secondary | ICD-10-CM | POA: Diagnosis not present

## 2017-12-30 DIAGNOSIS — M25561 Pain in right knee: Secondary | ICD-10-CM | POA: Diagnosis not present

## 2017-12-30 DIAGNOSIS — M25552 Pain in left hip: Secondary | ICD-10-CM | POA: Diagnosis not present

## 2017-12-30 DIAGNOSIS — E1121 Type 2 diabetes mellitus with diabetic nephropathy: Secondary | ICD-10-CM | POA: Diagnosis not present

## 2017-12-30 DIAGNOSIS — I10 Essential (primary) hypertension: Secondary | ICD-10-CM | POA: Diagnosis not present

## 2017-12-30 DIAGNOSIS — Z6831 Body mass index (BMI) 31.0-31.9, adult: Secondary | ICD-10-CM | POA: Diagnosis not present

## 2017-12-30 DIAGNOSIS — E668 Other obesity: Secondary | ICD-10-CM | POA: Diagnosis not present

## 2017-12-30 DIAGNOSIS — M25551 Pain in right hip: Secondary | ICD-10-CM | POA: Diagnosis not present

## 2017-12-30 DIAGNOSIS — C44799 Other specified malignant neoplasm of skin of left lower limb, including hip: Secondary | ICD-10-CM | POA: Diagnosis not present

## 2018-01-07 DIAGNOSIS — R809 Proteinuria, unspecified: Secondary | ICD-10-CM | POA: Diagnosis not present

## 2018-01-08 NOTE — Unmapped (Signed)
Labs collected on 12/31/17  Sent to Dr. Erma Heritage for review  Full report sent to media tab

## 2018-01-20 DIAGNOSIS — Z6832 Body mass index (BMI) 32.0-32.9, adult: Secondary | ICD-10-CM | POA: Diagnosis not present

## 2018-01-20 DIAGNOSIS — M4326 Fusion of spine, lumbar region: Secondary | ICD-10-CM | POA: Diagnosis not present

## 2018-01-21 NOTE — Unmapped (Signed)
[facility] Refill and Clinical Coordination Note  Medication(s): Stelara 90mg /ml    [name], DOB: 05/29/1965  Phone: [phone] (home) , Alternate phone contact: N/A  Shipping address: [address]  [place]  [address]  Phone or address changes today?: No  All above HIPAA information verified.  Insurance changes? No    Completed refill and clinical call assessment today to schedule patient's medication shipment from the [facility] [phone]).      MEDICATION RECONCILIATION    Confirmed the medication and dosage are correct and have not changed: Yes, regimen is correct and unchanged.    Were there any changes to your medication(s) in the past month:  No, there are no changes reported at this time.    ADHERENCE    Is this medicine transplant or covered by Medicare Part B? No.    Did you miss any doses in the past 4 weeks? No missed doses reported.  Adherence counseling provided? Not needed     SIDE EFFECT MANAGEMENT    Are you tolerating your medication?:  Mariaclara reports tolerating the medication.  Side effect management discussed: None      Therapy is appropriate and should be continued.    Evidence of clinical benefit: See Epic note from 10/14/17      FINANCIAL/SHIPPING    Delivery Scheduled: Yes, Expected medication delivery date: 01/27/18     Additional medications refilled: No additional medications/refills needed at this time.    The patient will receive a drug information handout for each medication shipped and additional FDA Medication Guides as required.      Jacquelyn did not have any additional questions at this time.    Delivery address confirmed in Epic.     We will follow up with patient monthly for standard refill processing and delivery.      Thank you,  Lupita Shutter   [facility] Specialty Pharmacist

## 2018-01-26 MED FILL — STELARA 90 MG/ML SUBCUTANEOUS SYRINGE: 30 days supply | Qty: 1 | Fill #0

## 2018-01-26 MED FILL — STELARA 90 MG/ML SUBCUTANEOUS SYRINGE: 30 days supply | Qty: 1 | Fill #0 | Status: AC

## 2018-01-28 DIAGNOSIS — M5136 Other intervertebral disc degeneration, lumbar region: Secondary | ICD-10-CM | POA: Diagnosis not present

## 2018-01-28 DIAGNOSIS — E669 Obesity, unspecified: Secondary | ICD-10-CM | POA: Diagnosis not present

## 2018-01-28 DIAGNOSIS — M2550 Pain in unspecified joint: Secondary | ICD-10-CM | POA: Diagnosis not present

## 2018-01-28 DIAGNOSIS — Z6831 Body mass index (BMI) 31.0-31.9, adult: Secondary | ICD-10-CM | POA: Diagnosis not present

## 2018-01-28 DIAGNOSIS — R768 Other specified abnormal immunological findings in serum: Secondary | ICD-10-CM | POA: Diagnosis not present

## 2018-01-29 DIAGNOSIS — Z23 Encounter for immunization: Secondary | ICD-10-CM | POA: Diagnosis not present

## 2018-02-03 DIAGNOSIS — Z13820 Encounter for screening for osteoporosis: Secondary | ICD-10-CM | POA: Diagnosis not present

## 2018-02-03 DIAGNOSIS — N959 Unspecified menopausal and perimenopausal disorder: Secondary | ICD-10-CM | POA: Diagnosis not present

## 2018-02-03 LAB — HM DEXA SCAN: HM Dexa Scan: NORMAL

## 2018-03-16 NOTE — Unmapped (Signed)
[facility] Refill Coordination Note  Specialty Medication(s): Stellara 90mg /ml  Additional Medications shipped: none    [name], DOB: 07-20-1965  Phone: [phone] (home) , Alternate phone contact: N/A  Phone or address changes today?: No  All above HIPAA information was verified with patient.  Shipping Address: [address]  [place] [address]   Insurance changes? No    Completed refill call assessment today to schedule patient's medication shipment from the [facility] [phone]).      Confirmed the medication and dosage are correct and have not changed: Yes, regimen is correct and unchanged.    Confirmed patient started or stopped the following medications in the past month:  No, there are no changes reported at this time.    Are you tolerating your medication?:  Thora reports tolerating the medication.    ADHERENCE    Patient currently has 0 syringes remaining.    Did you miss any doses in the past 4 weeks? No missed doses reported.    FINANCIAL/SHIPPING    Delivery Scheduled: Yes, Expected medication delivery date: 03/26/18     Medication will be delivered via UPS to the home address in [place].    The patient will receive a drug information handout for each medication shipped and additional FDA Medication Guides as required.      Cleon did not have any additional questions at this time.    We will follow up with patient monthly for standard refill processing and delivery.      Thank you,  Roderic Palau   [facility] Specialty Pharmacist

## 2018-03-18 MED ORDER — FLUCONAZOLE 100 MG TABLET
ORAL_TABLET | Freq: Every day | ORAL | 0 refills | 0.00000 days | Status: SS
Start: 2018-03-18 — End: 2018-05-01

## 2018-03-18 MED ORDER — FLUCONAZOLE 100 MG TABLET: 100 mg | tablet | Freq: Every day | 0 refills | 0 days | Status: SS

## 2018-03-18 NOTE — Unmapped (Addendum)
Pt called requesting refill on fluconazole for yeast infections that Dr. Erma Heritage usually lets her have on hand. She currently feels she has one. Msg sent to Dr. Erma Heritage     Per Dr. Erma Heritage, ok to refill diflucan 100mg  tablets. Pt requests refill be sent to [facility] instead. Resent

## 2018-03-25 MED FILL — STELARA 90 MG/ML SUBCUTANEOUS SYRINGE: 30 days supply | Qty: 1 | Fill #1

## 2018-03-25 MED FILL — STELARA 90 MG/ML SUBCUTANEOUS SYRINGE: 30 days supply | Qty: 1 | Fill #1 | Status: AC

## 2018-04-01 DIAGNOSIS — L578 Other skin changes due to chronic exposure to nonionizing radiation: Secondary | ICD-10-CM | POA: Diagnosis not present

## 2018-04-01 DIAGNOSIS — C44729 Squamous cell carcinoma of skin of left lower limb, including hip: Secondary | ICD-10-CM | POA: Diagnosis not present

## 2018-04-01 DIAGNOSIS — L570 Actinic keratosis: Secondary | ICD-10-CM | POA: Diagnosis not present

## 2018-04-01 DIAGNOSIS — L728 Other follicular cysts of the skin and subcutaneous tissue: Secondary | ICD-10-CM | POA: Diagnosis not present

## 2018-04-28 ENCOUNTER — Ambulatory Visit: Admit: 2018-04-28 | Discharge: 2018-04-29 | Payer: MEDICARE | Attending: Gastroenterology | Primary: Gastroenterology

## 2018-04-28 DIAGNOSIS — K5902 Outlet dysfunction constipation: Secondary | ICD-10-CM | POA: Diagnosis not present

## 2018-04-28 DIAGNOSIS — K5080 Crohn's disease of both small and large intestine without complications: Secondary | ICD-10-CM | POA: Diagnosis not present

## 2018-04-28 MED ORDER — ALPRAZOLAM 0.5 MG TABLET
ORAL_TABLET | Freq: Three times a day (TID) | ORAL | 2 refills | 0 days | Status: CP | PRN
Start: 2018-04-28 — End: 2018-10-27

## 2018-04-28 NOTE — Unmapped (Addendum)
1. Continue Stelara.  2. Anorectal manometry scheduled for 05/01/2018 at 9:30am. [facility].   3. Follow up in 6 months.   4. Check with PCP about pneumonia vaccines. We recommend Prevnar now and Pneumovax in 1 year due to your history of Crohn's disease and immunosuppression with Stelara.

## 2018-04-28 NOTE — Unmapped (Addendum)
[facility] FOLLOW-UP       Dominque Levandowski is a 52 y.o. female with a history of:     Diagnosis ICD-10-CM Associated Orders   1. Constipation due to outlet dysfunction K59.02 Anorectal Manometry   2. Crohn's disease of both small and large intestine without complication (CMS-HCC) K50.80        who returns for follow-up  to  the Inflammatory Bowel Disease Clinic at the [facility].     04/28/2018.     Patient Care Team:  Jules Husbands, MD as PCP - General  Lossie Faes, RN as Registered Nurse  [name], MD (Gastroenterology)     ACTIVE GASTROINTESTINAL ISSUES:  1.  History of recurrent perirectal abscesses.  2.  Longstanding history of Crohn's disease of the ileum that dates back to 1989.  3.  History of ileocolic resection 2000  4.  Colonoscopy on 08/24/2009 with no dysplasia.  5.  History of chronic Remicade therapy.  6.  History of hypertension.  7.  History of low B12 level, on supplementation.  8.  History of arthralgias on Remicade. .  9.  Ustekinumab trial 2014-2018  10. Commercial ustekinumab started in 2019.   11. Colonoscopy November 2018 - no active inflammation.     INTERVAL HISTORY    Vanilla Heatherington returns to clinic for a follow up visit today. Vedanshi is a 52 year old woman with long-standing ileocolonic Crohn's disease as well as some rectal issues.  She was on Remicade for a prolonged period of time and did quite well.  Due to insurance reasons she ended up having to come off of the Remicade and was eventually transitioned to ustekinumab as part of the ustekinumab trial.  She did very well on the ustekinumab and at the end of the trial she was transitioned to commercial ustekinumab.  There have continued to be financial issues regarding obtaining the ustekinumab but on the ustekinumab she is in a complete endoscopic remission.  Her last colonoscopy was done in November 2018.  There was no dysplasia and no active inflammation.  She otherwise feels like she is doing really quite well.  She's had a lot of stress with caring for her aging mother and has not had a flare. She reports 2-3 bowel movements daily without blood. They alternate between formed and loose. She also has difficulty evacuating though there is a sensation to move her bowels. We were planning on an anorectal manometry but she had difficulty scheduling this due to time caring for her mother. She denies upper symptoms including nausea, vomiting, or bloating. She continues on phentermine for weight loss.  She presents today for follow-up.     PAST MEDICAL HISTORY    Past Medical History:   Diagnosis Date   ??? Crohn's disease (CMS-HCC)    ??? DDD (degenerative disc disease), lumbar    ??? Diabetes mellitus (CMS-HCC)     Borderline, doesn't take meds and doesn't check sugars.  Diet managed.   ??? H/O hypotension     States occurred post hysterectomy and colon surgery.   ??? Heart murmur    ??? High cholesterol    ??? Hypertension    ??? Joint pain        Past Surgical History:   Procedure Laterality Date   ??? APPENDECTOMY      Childhood   ??? COLON SURGERY  1992 and 1999    small intestine-total 20 inches removed   ??? HYSTERECTOMY  Complete   ??? PR COLONOSCOPY W/BIOPSY SINGLE/MULTIPLE  03/17/2013    Procedure: COLONOSCOPY, FLEXIBLE, PROXIMAL TO SPLENIC FLEXURE; WITH BIOPSY, SINGLE OR MULTIPLE;  Surgeon: Teodoro Spray, MD;  Location: GI PROCEDURES [facility];  Service: Gastroenterology   ??? PR COLONOSCOPY W/BIOPSY SINGLE/MULTIPLE N/A 03/13/2017    Procedure: COLONOSCOPY, FLEXIBLE, PROXIMAL TO SPLENIC FLEXURE; WITH BIOPSY, SINGLE OR MULTIPLE;  Surgeon: [name], MD;  [facility];  Service: Gastroenterology   ??? PR LAMINEC/FACETECT/FORAMIN,LUMBAR 1 SEG Bilateral 05/31/2015    Procedure: L4-5 HEMILAMINECTOMY, DISCECTOMY;  Surgeon: Malachy Chamber, MD;  Location: OR [facility];  Service: Orthopedics   ??? TONSILLECTOMY          IBD HISTORY    Date of Diagnosis: Approximately 1990  Crohn's disease Behavior: penetrating  Crohn's Location: Ileocolonic and perineal  Extraintestinal Manifestations: Arthralgias.  Prior IBD Medications: Remicade, - pancreatitis, cellcept, ustekinumab (current)  TPMT Status: Unknown      Vienna Classification of Crohn's disease    Age at diagnosis  A1 < 40    Disease distribution  L3 Ileocolonic    Disease Behavior  B3 penetrating      CURRENT MEDICATIONS    Medications were reviewed with the patient and include:    Current Outpatient Medications   Medication Sig Dispense Refill   ??? ALPRAZolam (XANAX) 0.5 MG tablet Take 1 tablet (0.5 mg total) by mouth Three (3) times a day as needed for sleep. 90 tablet 2   ??? calcium carbonate-vitamin D2 500 mg(1,250mg ) -200 unit tablet Take 1 tablet by mouth nightly.     ??? cyanocobalamin, vitamin B-12, (VITAMIN B-12) 5,000 mcg Subl Place 1 tablet under the tongue nightly.     ??? estradiol (ESTRACE) 1 MG tablet Take 1 mg by mouth nightly.      ??? fluconazole (DIFLUCAN) 100 MG tablet Take 1 tablet (100 mg total) by mouth daily. 30 tablet 0   ??? furosemide (LASIX) 10 mg/mL solution Take by mouth once as needed.     ??? FUROSEMIDE ORAL Take 10 mg by mouth daily as needed.     ??? gabapentin (NEURONTIN) 300 MG capsule Take 300-600 mg by mouth Three (3) times a day. 300 mg morning and noon and 600 mg at night.     ??? glucosamine-chondroitin 500-400 mg tablet Take 2 tablets by mouth nightly.      ??? lisinopril (PRINIVIL,ZESTRIL) 5 MG tablet Take 5 mg by mouth nightly.     ??? omega-3 fatty acids-fish oil (FISH OIL) 300-1,000 mg cap Take 1 capsule by mouth daily. Last taken 05/26/15     ??? pregabalin (LYRICA ORAL) Take by mouth daily.     ??? rosuvastatin (CRESTOR) 10 MG tablet Take 10 mg by mouth once a week.     ??? simvastatin (ZOCOR) 40 MG tablet Take 20 mg by mouth nightly. Frequency:QD   Dosage:20   MG  Instructions:  Note:Dose: 20MG      ??? ustekinumab (STELARA) 90 mg/mL Syrg syringe INJECT THE CONTENTS OF 1 SYRINGE (90MG ) UNDER THE SKIN EVERY 8 WEEKS 1 mL 5     No current facility-administered medications for this visit.            Allergies:    Allergies   Allergen Reactions   ??? Mercaptopurine Other (See Comments)     Other reaction(s): Pancreatitis  Other reaction(s): Other (See Comments)  Used to treat pt. Crohn's Disease.  Caused Pancreatis 1992.  Used to treat pt. Crohn's Disease.  Caused  Pancreatis 1992.     ??? Metoprolol Other (See Comments)     Extreme fatigue   ??? Morphine Itching     States can take short term.   ??? Penicillins Rash     Other reaction(s): RASH        Family History   Problem Relation Age of Onset   ??? Hypertension Mother    ??? Diabetes Mother    ??? Heart disease Mother         CHF   ??? Atrial fibrillation Mother    ??? Atrial fibrillation Father    ??? Diabetes Father         Social History     Socioeconomic History   ??? Marital status: Married     Spouse name: Not on file   ??? Number of children: Not on file   ??? Years of education: Not on file   ??? Highest education level: Not on file   Occupational History   ??? Not on file   Social Needs   ??? Financial resource strain: Not on file   ??? Food insecurity:     Worry: Not on file     Inability: Not on file   ??? Transportation needs:     Medical: Not on file     Non-medical: Not on file   Tobacco Use   ??? Smoking status: Former Smoker     Packs/day: 1.00     Years: 12.00     Pack years: 12.00     Last attempt to quit: 03/17/2001     Years since quitting: 17.1   ??? Smokeless tobacco: Never Used   Substance and Sexual Activity   ??? Alcohol use: No   ??? Drug use: No   ??? Sexual activity: Not on file   Lifestyle   ??? Physical activity:     Days per week: Not on file     Minutes per session: Not on file   ??? Stress: Not on file   Relationships   ??? Social connections:     Talks on phone: Not on file     Gets together: Not on file     Attends religious service: Not on file     Active member of club or organization: Not on file     Attends meetings of clubs or organizations: Not on file     Relationship status: Not on file   Other Topics Concern   ??? Not on file   Social History Narrative   ??? Not on file             REVIEW OF SYSTEMS  Gen: no fever, chills, intentional weight loss  HEENT: no epistaxis, no oral ulcers  Eyes: no jaundice  CV: no chest pain, no orthopnea  PULM: no shortness of breath, no cough  ABD: 2 bowel movements per day - no bleeding -   MSK: arthralgias mostly hips and knees  Skin: no rash, no pruritis  Neuro: no headache, no confusion  Psych: doing well.     PHYSICAL EXAMINATION  Vitals:   Vitals:    04/28/18 0825   BP: 152/74   Pulse: 70     Wt Readings from Last 4 Encounters:   04/28/18 84.4 kg (186 lb)   10/14/17 86 kg (189 lb 9.5 oz)   04/14/17 91.9 kg (202 lb 8 oz)   03/13/17 92.3 kg (203 lb 8 oz)       Gen: Well appearing female in  no acute distress  HEENT: oropharynx clear, no adenopathy  Eyes: no scleral icterus  CV: normal S1, S2, soft systolic murmur.   PULM: Clear to auscultation bilaterally.  ABD: Non-distended, soft, non-tender, BS present in all 4 quadrants, liver and spleen non-palpable.  Ext: warm, no edema, bruise on right lower leg  Neuro: Alert and oriented, no asterixis, normal gait, symmetric strength.  Skin: no rash, no jaundice       RECENT LABS  Date of service 07/23/2017???[facility]  Sodium 139 potassium 3.7 chloride 102 CO2 31 BUN 16 creatinine 0.59 glucose 102 calcium 9.6 albumin 4.3 total protein 7.3 total bilirubin 0.7 AST 15 ALT 14 alkaline phosphatase 54  CBC hemoglobin 14.7 hematocrit 43.1 platelets 215    Assessment and Plan:    Pattijo Juste is a 52 y.o. female with a history of ileocolonic Crohn's disease and perineal disease who did very well on the ustekinumab trial and has been transitioned to commercial ustekinumab.  We will continue the ustekinumab as long as the finances work out.  If there become difficulties with the finances we may need to consider an alternative medication or possible enrollment into 1 of the other clinical trials.  She will be due colorectal cancer screening in approximately 4 years.   She will call if she has any problems.      PLAN  1. Continue ustekinumab  2. Anorectal manometry scheduled for Friday 12/20 to assess evacuation issues.   3. Up to date on health maintenance - PAP and mammograms. Flu up to date (02/2018), she will check with her PCP about pneumonia series. We recommend prevnar and pneumovax in 6-12 months.   4. Colonoscopy in 2023 for CRC screening.  5. Return to clinic in 6 months.  6. Xanax prescription sent in.     Patient seen by and discussed with Dr. Erma Heritage who agrees with the above assessment and plan.     Quincy Simmonds, MD MPH  Advanced Inflammatory Bowel Disease Fellow, PGY-7  [facility]        Kim L. Erma Heritage, MD PhD  Professor of Medicine  Division of Gastroenterology and Hepatology  [id number], Room [address] [place], [place] [address]  Phone: [phone]  [place] consultation line: [phone]       Return in about 6 months (around 10/28/2018).     Patient Instructions   1. Continue Stelara.  2. Anorectal manometry scheduled for 05/01/2018 at 9:30am. [facility].   3. Follow up in 6 months.   4. Check with PCP about pneumonia vaccines. We recommend Prevnar now and Pneumovax in 1 year due to your history of Crohn's disease and immunosuppression with Stelara.        Orders Placed This Encounter   Procedures   ??? Anorectal Manometry

## 2018-04-29 DIAGNOSIS — E1121 Type 2 diabetes mellitus with diabetic nephropathy: Secondary | ICD-10-CM | POA: Diagnosis not present

## 2018-04-29 DIAGNOSIS — Z0001 Encounter for general adult medical examination with abnormal findings: Secondary | ICD-10-CM | POA: Diagnosis not present

## 2018-04-29 DIAGNOSIS — M1712 Unilateral primary osteoarthritis, left knee: Secondary | ICD-10-CM | POA: Diagnosis not present

## 2018-04-29 DIAGNOSIS — L650 Telogen effluvium: Secondary | ICD-10-CM | POA: Diagnosis not present

## 2018-04-29 DIAGNOSIS — R5383 Other fatigue: Secondary | ICD-10-CM | POA: Diagnosis not present

## 2018-04-29 DIAGNOSIS — E1169 Type 2 diabetes mellitus with other specified complication: Secondary | ICD-10-CM | POA: Diagnosis not present

## 2018-04-29 DIAGNOSIS — Z23 Encounter for immunization: Secondary | ICD-10-CM | POA: Diagnosis not present

## 2018-04-29 DIAGNOSIS — D518 Other vitamin B12 deficiency anemias: Secondary | ICD-10-CM | POA: Diagnosis not present

## 2018-04-29 DIAGNOSIS — I10 Essential (primary) hypertension: Secondary | ICD-10-CM | POA: Diagnosis not present

## 2018-04-29 DIAGNOSIS — E782 Mixed hyperlipidemia: Secondary | ICD-10-CM | POA: Diagnosis not present

## 2018-05-01 ENCOUNTER — Ambulatory Visit: Admit: 2018-05-01 | Discharge: 2018-05-01 | Payer: MEDICARE

## 2018-05-01 DIAGNOSIS — K5900 Constipation, unspecified: Secondary | ICD-10-CM | POA: Diagnosis not present

## 2018-05-01 NOTE — Unmapped (Signed)
GI Motility Lab Discharge Instructions:     *You may resume your normal activities, diet, and medications after your procedure unless your doctor instructed you otherwise.      *You may notice a little blood on the tissue due to irritation from the procedure.    *If you have any unusual pain or bleeding we recommend you call the GI physician  on call at [phone] or report to the nearest emergency room.    *Contact your doctor for your test results, medications questions, or other issues.  Allow two weeks for your doctor to receive the test results.

## 2018-05-02 MED ORDER — USTEKINUMAB 90 MG/ML SUBCUTANEOUS SYRINGE
5 refills | 0 days | Status: CP
Start: 2018-05-02 — End: 2019-05-02
  Filled 2018-06-03: qty 1, 30d supply, fill #0

## 2018-05-12 NOTE — Unmapped (Signed)
[facility] Refill Coordination Note  Specialty Medication(s): Stelara 90mg /ml  Additional Medications shipped:      Brenda Beard, DOB: Sep 16, 1965  Phone: [phone] (home) , Alternate phone contact: N/A  Phone or address changes today?: No  All above HIPAA information was verified with patient.  Shipping Address: [address]  [place] [address]   Insurance changes? No    Completed refill call assessment today to schedule patient's medication shipment from the [facility] [phone]).      Confirmed the medication and dosage are correct and have not changed: Yes, regimen is correct and unchanged.    Confirmed patient started or stopped the following medications in the past month:  No, there are no changes reported at this time.    Are you tolerating your medication?:  Brenda Beard reports tolerating the medication.    ADHERENCE    (Below is required for Medicare Part B or Transplant patients only - per drug):   How many tablets were dispensed last month:    Patient currently has   remaining.    Did you miss any doses in the past 4 weeks? No missed doses reported.    FINANCIAL/SHIPPING    Delivery Scheduled: Yes, Expected medication delivery date: 011020     Medication will be delivered via UPS to the home address in [facility].    The patient will receive a drug information handout for each medication shipped and additional FDA Medication Guides as required.      Leahna did not have any additional questions at this time.    We will follow up with patient monthly for standard refill processing and delivery.      Thank you,  [name]   [facility] Specialty Technician

## 2018-05-15 DIAGNOSIS — J018 Other acute sinusitis: Secondary | ICD-10-CM | POA: Diagnosis not present

## 2018-05-21 NOTE — Unmapped (Addendum)
Brenda Beard 's Stelara shipment will be delayed due to Insurance has been terminated We have contacted the patient and communicated the delivery change to patient/caregiver We will call the patient to reschedule the delivery upon resolution. We have confirmed the delivery date as 06/04/18 via UPS .

## 2018-06-03 MED FILL — STELARA 90 MG/ML SUBCUTANEOUS SYRINGE: 30 days supply | Qty: 1 | Fill #0 | Status: AC

## 2018-06-17 DIAGNOSIS — H5203 Hypermetropia, bilateral: Secondary | ICD-10-CM | POA: Diagnosis not present

## 2018-06-17 LAB — HM DIABETES EYE EXAM

## 2018-07-14 DIAGNOSIS — Z6832 Body mass index (BMI) 32.0-32.9, adult: Secondary | ICD-10-CM | POA: Diagnosis not present

## 2018-07-14 DIAGNOSIS — M4326 Fusion of spine, lumbar region: Secondary | ICD-10-CM | POA: Diagnosis not present

## 2018-07-14 DIAGNOSIS — M545 Low back pain: Secondary | ICD-10-CM | POA: Diagnosis not present

## 2018-07-14 DIAGNOSIS — M5106 Intervertebral disc disorders with myelopathy, lumbar region: Secondary | ICD-10-CM | POA: Diagnosis not present

## 2018-07-14 NOTE — Unmapped (Signed)
[facility] Clinical Assessment & Refill Coordination Note    Brenda Beard, DOB: 1966-03-27  Phone: [phone] (home)     All above HIPAA information was verified with patient.     Specialty Medication(s):   Inflammatory Disorders: Stelara     Current Outpatient Medications   Medication Sig Dispense Refill   ??? ALPRAZolam (XANAX) 0.5 MG tablet Take 1 tablet (0.5 mg total) by mouth Three (3) times a day as needed for sleep. 90 tablet 2   ??? calcium carbonate-vitamin D2 500 mg(1,250mg ) -200 unit tablet Take 1 tablet by mouth nightly.     ??? cholecalciferol, vitamin D3, 125 mcg (5,000 unit) tablet Take 5,000 Units by mouth daily.     ??? cyanocobalamin, vitamin B-12, (VITAMIN B-12) 5,000 mcg Subl Place 1 tablet under the tongue nightly.     ??? estradiol (ESTRACE) 1 MG tablet Take 1 mg by mouth nightly.      ??? FUROSEMIDE ORAL Take 10 mg by mouth daily as needed.     ??? gabapentin (NEURONTIN) 300 MG capsule Take 600 mg by mouth nightly.      ??? glucosamine-chondroitin 500-400 mg tablet Take 2 tablets by mouth nightly.      ??? omega-3 fatty acids-fish oil (FISH OIL) 300-1,000 mg cap Take 1 capsule by mouth daily. Last taken 05/26/15     ??? rosuvastatin (CRESTOR) 10 MG tablet Take 10 mg by mouth 3 (three) times a week.      ??? UNABLE TO FIND Take 1,000 mg by mouth two (2) times a day. Med Name: tumeric     ??? UNABLE TO FIND Take 1 capsule by mouth two (2) times a day. Med Name: cherry extract     ??? ustekinumab (STELARA) 90 mg/mL Syrg syringe INJECT THE CONTENTS OF 1 SYRINGE (90MG ) UNDER THE SKIN EVERY 8 WEEKS 1 mL 5     No current facility-administered medications for this visit.         Changes to medications: Avonna reports no changes reported at this time.    Allergies   Allergen Reactions   ??? Mercaptopurine Other (See Comments)     Other reaction(s): Pancreatitis.  Used to treat pt. Crohn's Disease.  Caused Pancreatis 1992.        ??? Metoprolol Other (See Comments)     Extreme fatigue   ??? Morphine Itching     States can take short term.   ??? Penicillins Rash             Changes to allergies: No    SPECIALTY MEDICATION ADHERENCE     Stelara 90 mg/ml: 0 days of medicine on hand          Specialty medication(s) dose(s) confirmed: Regimen is correct and unchanged.     Are there any concerns with adherence? No    Adherence counseling provided? Not needed    CLINICAL MANAGEMENT AND INTERVENTION      Clinical Benefit Assessment:    Do you feel the medicine is effective or helping your condition? Yes    Clinical Benefit counseling provided? Not needed    Adverse Effects Assessment:    Are you experiencing any side effects? No    Are you experiencing difficulty administering your medicine? No    Quality of Life Assessment:    How many days over the past month did your Crohn's Disease   keep you from your normal activities? For example, brushing your teeth or getting up in the morning.  0    Have you discussed this with your provider? Not needed    Therapy Appropriateness:    Is therapy appropriate? Yes, therapy is appropriate and should be continued    DISEASE/MEDICATION-SPECIFIC INFORMATION      For patients on injectable medications: Patient currently has 0 doses left.  Next injection is scheduled for 08/03/18.    PATIENT SPECIFIC NEEDS     ? Does the patient have any physical, cognitive, or cultural barriers? No    ? Is the patient high risk? No     ? Does the patient require a Care Management Plan? No     ? Does the patient require physician intervention or other additional services (i.e. nutrition, smoking cessation, social work)? No      SHIPPING     Specialty Medication(s) to be Shipped:   Inflammatory Disorders: Stelara    Other medication(s) to be shipped: n/a     Changes to insurance: No    Delivery Scheduled: Yes, Expected medication delivery date: 07/28/18.     Medication will be delivered via UPS to the confirmed home address in [facility].    The patient will receive a drug information handout for each medication shipped and additional FDA Medication Guides as required.  Verified that patient has previously received a Conservation officer, historic buildings.    Alois Colgan Vangie Bicker   [facility] Specialty Pharmacist

## 2018-07-15 MED ORDER — CIPROFLOXACIN 500 MG TABLET
Freq: Two times a day (BID) | ORAL | 0 refills | 0.00000 days | Status: CP
Start: 2018-07-15 — End: 2018-07-27

## 2018-07-15 NOTE — Unmapped (Signed)
Spoke with pt this morning. She had sent mychart message yesterday evening of what sounds like a perianal abscess. She states it was the size of a marble, no fevers and it has started draining today. Spoke with Dr. Erma Heritage, pt to start cipro 500mg  BID for 10 days.

## 2018-07-27 DIAGNOSIS — K611 Rectal abscess: Principal | ICD-10-CM

## 2018-07-27 IMAGING — MR MR LUMBAR SPINE W/O CM
4 of 5 series · 18 of 48 positions shown · non-contrast
Comparison: [REDACTED] lumbar MRI 10/24/2016, and earlier
including lumbar radiographs 10/19/2008.

CLINICAL DATA: 51-year-old female with right greater than left
lumbar back pain. Pain radiating to the right hip and leg.
Progressive symptoms for 2 weeks. Temporary relief with spinal
injection in January 2017.

EXAM:
MRI LUMBAR SPINE WITHOUT CONTRAST
TECHNIQUE: Multiplanar, multisequence MR imaging of the lumbar spine was
performed. No intravenous contrast was administered.

[Series 6: T2 · sagittal · 4.0mm · 0.73mm/px · 6 of 15 slices shown (1 of 2)]
[im 1/15]
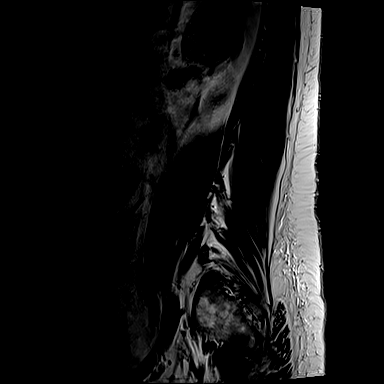
[im 3/15]
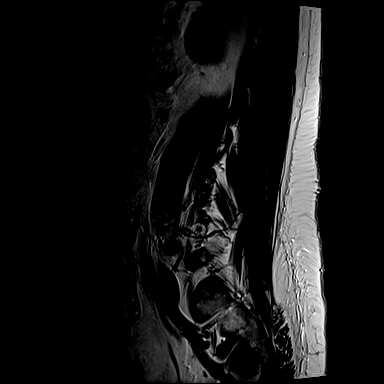
[im 6/15]
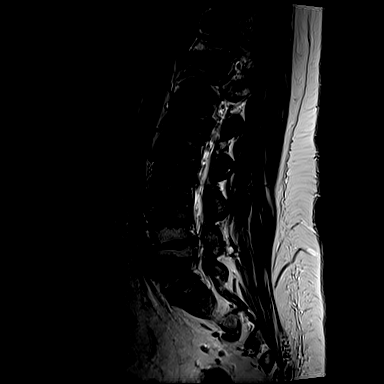
[im 9/15]
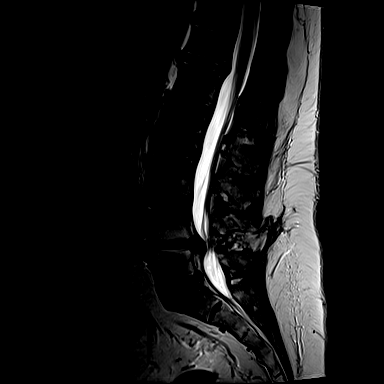
[im 12/15]
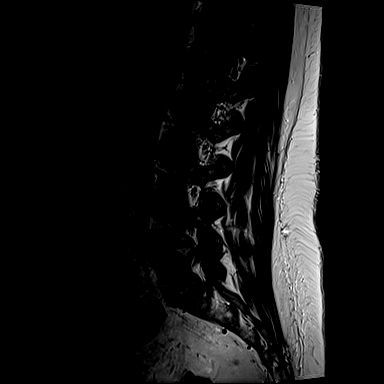
[im 15/15]
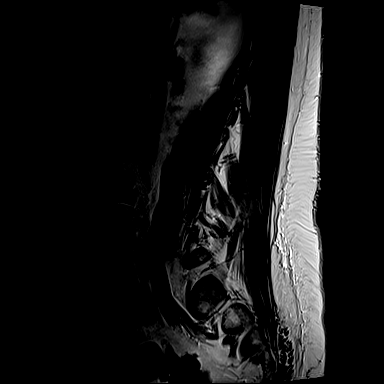

[Series 7: T1 · sagittal · 4.0mm · 0.73mm/px · 3 of 15 slices shown (1 of 2)]
[im 3/15]
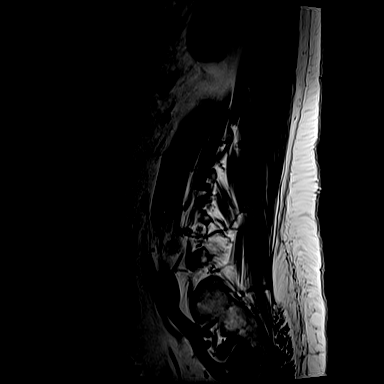
[im 9/15]
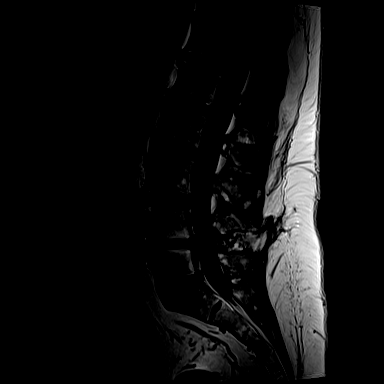
[im 15/15]
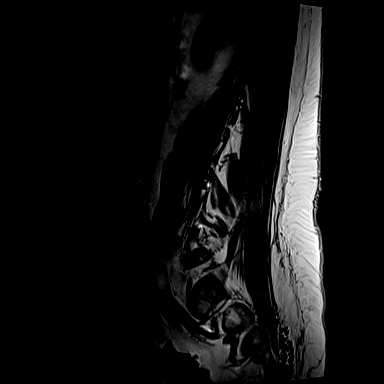

[Series 13: T2 · axial · 4.0mm · 0.28mm/px · z∈[-48,+142]mm · 6 of 41 slices shown (2 of 2)]
[im 1/41]
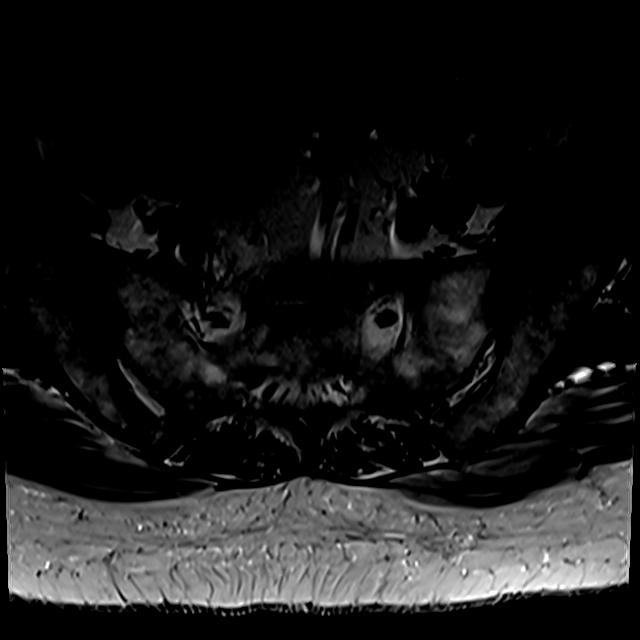
[im 6/41]
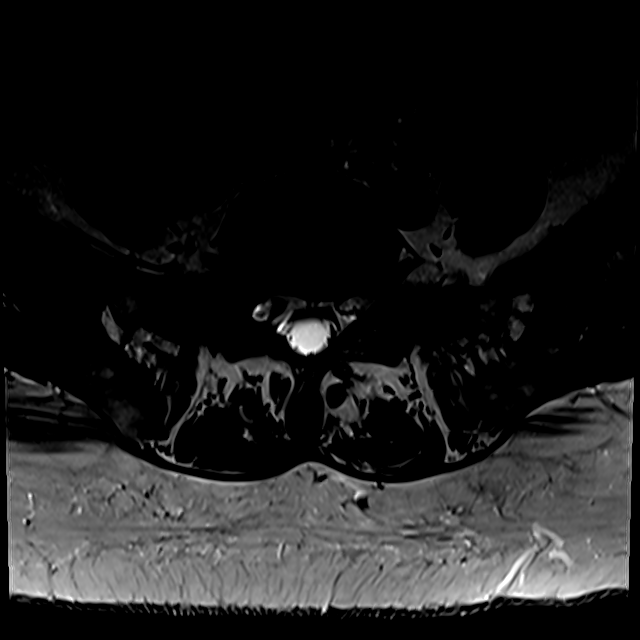
[im 12/41]
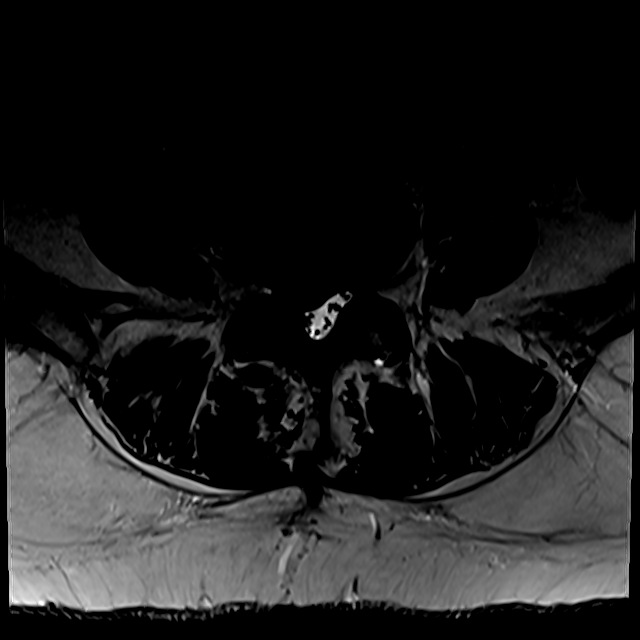
[im 18/41]
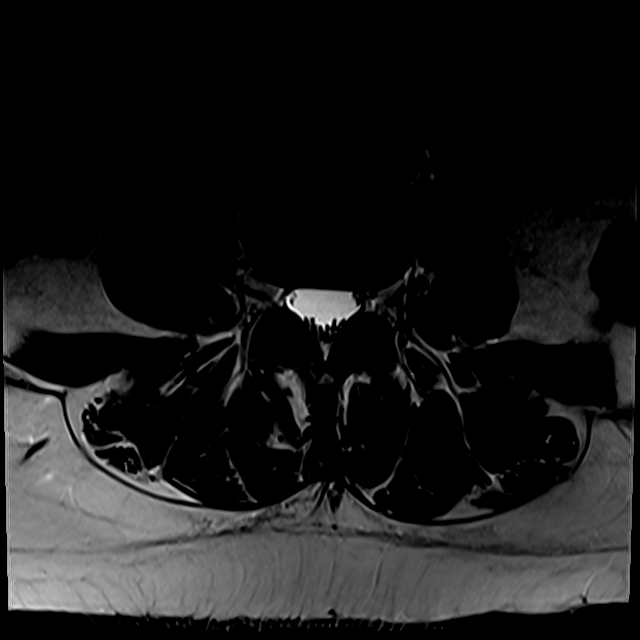
[im 21/41]
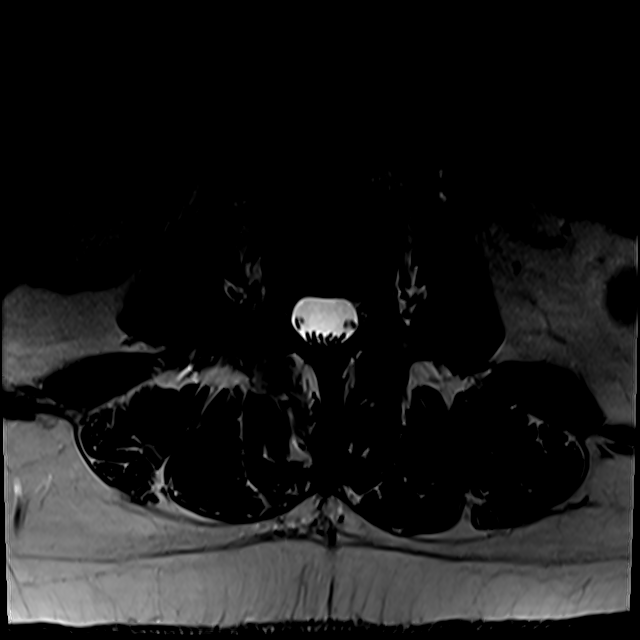
[im 35/41]
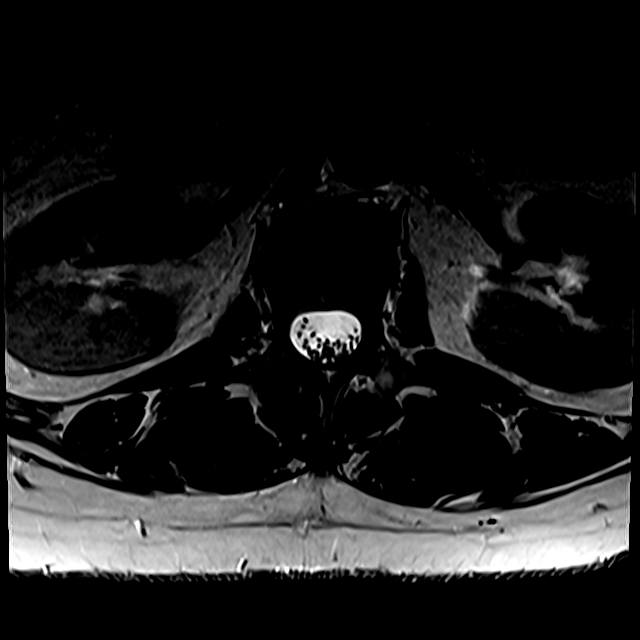

[Series 100: T1 · axial · 4.0mm · 0.28mm/px · z∈[-23,+142]mm · 3 of 41 slices shown (2 of 2)]
[im 6/41]
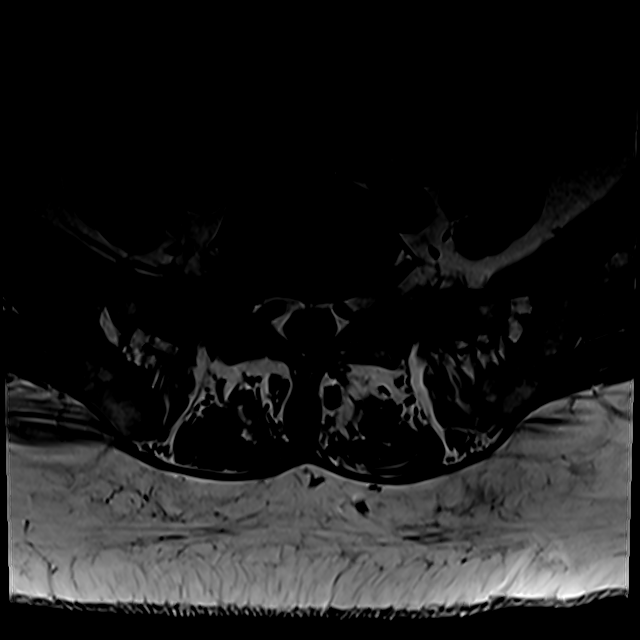
[im 21/41]
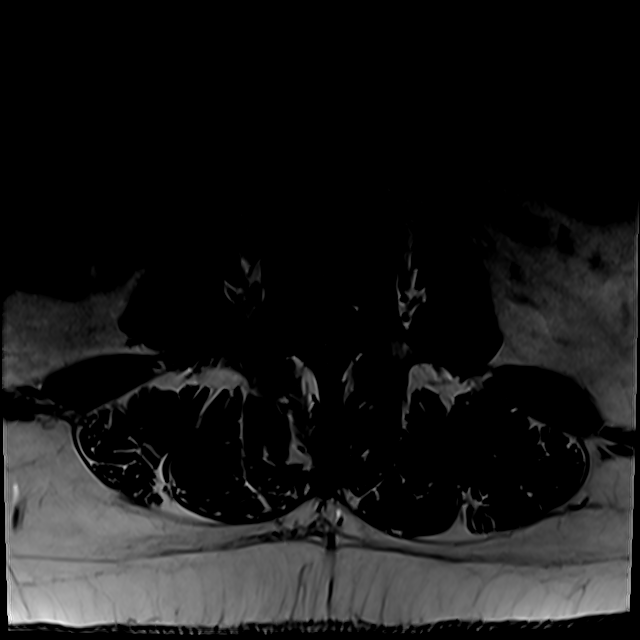
[im 35/41]
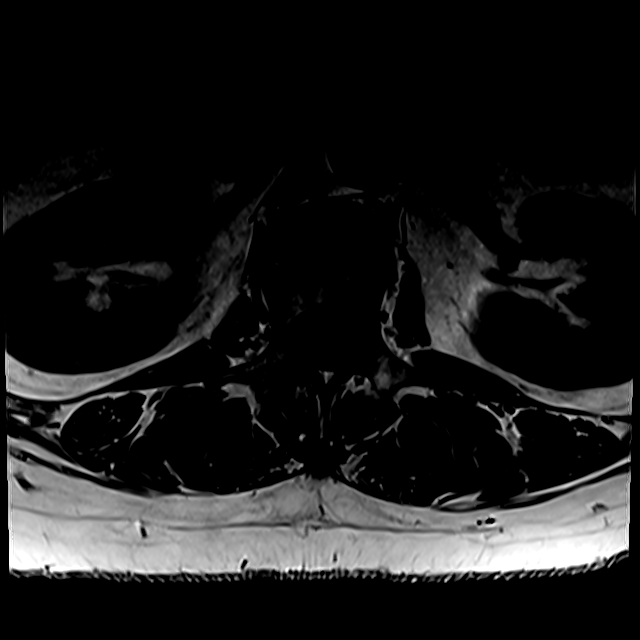

[18 of 48 positions shown; findings below may reference images not displayed]

FINDINGS: Segmentation: Normal on the 9414 radiographs which corresponds to
the same numbering system on the 2601 MRI.

Alignment: Stable vertebral height and alignment including subtle
retrolisthesis of L4 on L5 and straightening of lower lumbar
lordosis.

Vertebrae: Chronic degenerative endplate marrow signal changes at L4
with regression of degenerative endplate marrow edema at that level
since [REDACTED]. Background bone marrow signal is within normal limits.
No new marrow edema or osseous abnormality. Intact visible sacrum
and SI joints.

Conus medullaris and cauda equina: Conus extends to the T12-L1
level. Conus and cauda equina appear normal.

Paraspinal and other soft tissues: Mild postoperative changes to the
posterior paraspinal soft tissues at L4-L5. Negative visible
abdominal viscera. Sigmoid diverticulosis is partially visible.

Disc levels:

T11-T12: Negative.

T12-L1: Stable mild disc bulge and small central disc protrusion
with no stenosis.

L1-L2:  Negative.

L2-L3:  Negative.

L3-L4:  Stable mild facet hypertrophy.

L4-L5: Chronic disc desiccation and disc space loss with
circumferential disc bulge. A moderate to large right paracentral
disc extrusion has developed since the prior MRI (series 13, image
30). Stable superimposed mild bilateral facet hypertrophy. Severe
right lateral recess stenosis (descending right L5 and S1 nerve
levels). Mild new spinal stenosis. Borderline to mild bilateral L4
neural foraminal stenosis has not significantly changed.

L5-S1: Chronic mild disc desiccation and broad-based posterior disc
protrusion, best seen on series 6, image 7. The posterior disc
appears mildly progressed on sagittal images, but there is no
associated spinal stenosis. No convincing lateral recess or neural
foraminal stenosis.
IMPRESSION: 1. At L4-L5 a bulky rightward disc extrusion as developed since the
prior MRI. Severe right lateral recess stenosis at the right L5 and
S1 nerve levels. Mild associated spinal stenosis.
2. Mild progression of chronic disc degeneration at L5-S1, but no
convincing neural impingement.

## 2018-07-27 MED ORDER — CIPROFLOXACIN 500 MG TABLET
Freq: Two times a day (BID) | ORAL | 3 refills | 0 days | Status: CP
Start: 2018-07-27 — End: ?

## 2018-07-27 MED FILL — STELARA 90 MG/ML SUBCUTANEOUS SYRINGE: 56 days supply | Qty: 1 | Fill #1 | Status: AC

## 2018-07-27 MED FILL — STELARA 90 MG/ML SUBCUTANEOUS SYRINGE: 56 days supply | Qty: 1 | Fill #1

## 2018-07-27 NOTE — Unmapped (Signed)
Addended byCedric Fishman on: 07/27/2018 04:11 PM     Modules accepted: Orders

## 2018-09-11 NOTE — Unmapped (Signed)
[facility] Clinical Assessment & Refill Coordination Note    Brenda Beard, DOB: 01/27/1966  Phone: [phone] (home)     All above HIPAA information was verified with patient.     Specialty Medication(s):   Inflammatory Disorders: Stelara     Current Outpatient Medications   Medication Sig Dispense Refill   ??? ALPRAZolam (XANAX) 0.5 MG tablet Take 1 tablet (0.5 mg total) by mouth Three (3) times a day as needed for sleep. 90 tablet 2   ??? calcium carbonate-vitamin D2 500 mg(1,250mg ) -200 unit tablet Take 1 tablet by mouth nightly.     ??? cholecalciferol, vitamin D3, 125 mcg (5,000 unit) tablet Take 5,000 Units by mouth daily.     ??? ciprofloxacin HCl (CIPRO) 500 MG tablet Take 1 tablet (500 mg total) by mouth every twelve (12) hours. For 10 days 20 each 3   ??? cyanocobalamin, vitamin B-12, (VITAMIN B-12) 5,000 mcg Subl Place 1 tablet under the tongue nightly.     ??? estradiol (ESTRACE) 1 MG tablet Take 1 mg by mouth nightly.      ??? FUROSEMIDE ORAL Take 10 mg by mouth daily as needed.     ??? gabapentin (NEURONTIN) 300 MG capsule Take 600 mg by mouth nightly.      ??? glucosamine-chondroitin 500-400 mg tablet Take 2 tablets by mouth nightly.      ??? omega-3 fatty acids-fish oil (FISH OIL) 300-1,000 mg cap Take 1 capsule by mouth daily. Last taken 05/26/15     ??? rosuvastatin (CRESTOR) 10 MG tablet Take 10 mg by mouth 3 (three) times a week.      ??? UNABLE TO FIND Take 1,000 mg by mouth two (2) times a day. Med Name: tumeric     ??? UNABLE TO FIND Take 1 capsule by mouth two (2) times a day. Med Name: cherry extract     ??? ustekinumab (STELARA) 90 mg/mL Syrg syringe INJECT THE CONTENTS OF 1 SYRINGE (90MG ) UNDER THE SKIN EVERY 8 WEEKS 1 mL 5     No current facility-administered medications for this visit.         Changes to medications: [name] reports no changes at this time.    Allergies   Allergen Reactions   ??? Mercaptopurine Other (See Comments)     Other reaction(s): Pancreatitis.  Used to treat pt. Crohn's Disease.  Caused Pancreatis 1992.       Other reaction(s): Other (See Comments)  Used to treat pt. Crohn's Disease.  Caused Pancreatis 1992.   ??? Metoprolol Other (See Comments)     Extreme fatigue   ??? Morphine Itching     States can take short term.   ??? Penicillins Rash             Changes to allergies: No    SPECIALTY MEDICATION ADHERENCE     Stelara 90 mg/ml: 0 days of medicine on hand       Medication Adherence    Patient reported X missed doses in the last month:  0  Specialty Medication:  Stelara 90mg /ml          Specialty medication(s) dose(s) confirmed: Regimen is correct and unchanged.     Are there any concerns with adherence? No    Adherence counseling provided? Not needed    CLINICAL MANAGEMENT AND INTERVENTION      Clinical Benefit Assessment:    Do you feel the medicine is effective or helping your condition? Yes    Clinical Benefit  counseling provided? Not needed    Adverse Effects Assessment:    Are you experiencing any side effects? No    Are you experiencing difficulty administering your medicine? No    Quality of Life Assessment:    How many days over the past month did your Crohn's Disease keep you from your normal activities? For example, brushing your teeth or getting up in the morning. 0    Have you discussed this with your provider? Not needed    Therapy Appropriateness:    Is therapy appropriate? Yes, therapy is appropriate and should be continued    DISEASE/MEDICATION-SPECIFIC INFORMATION      For patients on injectable medications: Patient currently has 0 doses left.  Next injection is scheduled for 09/25/2018.    PATIENT SPECIFIC NEEDS     ? Does the patient have any physical, cognitive, or cultural barriers? No    ? Is the patient high risk? No     ? Does the patient require a Care Management Plan? No     ? Does the patient require physician intervention or other additional services (i.e. nutrition, smoking cessation, social work)? No      SHIPPING     Specialty Medication(s) to be Shipped:   Inflammatory Disorders: Stelara 90mg /ml    Other medication(s) to be shipped: none       Changes to insurance: No    Delivery Scheduled: Yes, Expected medication delivery date: 09/22/2018.     Medication will be delivered via UPS to the confirmed home address in [facility].    The patient will receive a drug information handout for each medication shipped and additional FDA Medication Guides as required.  Verified that patient has previously received a Conservation officer, historic buildings.    Karene Fry [name]   [facility] Specialty Pharmacist

## 2018-09-21 MED FILL — STELARA 90 MG/ML SUBCUTANEOUS SYRINGE: 56 days supply | Qty: 1 | Fill #2

## 2018-09-21 MED FILL — STELARA 90 MG/ML SUBCUTANEOUS SYRINGE: 56 days supply | Qty: 1 | Fill #2 | Status: AC

## 2018-10-09 DIAGNOSIS — Z202 Contact with and (suspected) exposure to infections with a predominantly sexual mode of transmission: Secondary | ICD-10-CM | POA: Diagnosis not present

## 2018-10-09 DIAGNOSIS — Z6828 Body mass index (BMI) 28.0-28.9, adult: Secondary | ICD-10-CM | POA: Diagnosis not present

## 2018-10-09 DIAGNOSIS — M7918 Myalgia, other site: Secondary | ICD-10-CM | POA: Diagnosis not present

## 2018-10-09 DIAGNOSIS — E782 Mixed hyperlipidemia: Secondary | ICD-10-CM | POA: Diagnosis not present

## 2018-10-09 DIAGNOSIS — I10 Essential (primary) hypertension: Secondary | ICD-10-CM | POA: Diagnosis not present

## 2018-10-09 DIAGNOSIS — E1121 Type 2 diabetes mellitus with diabetic nephropathy: Secondary | ICD-10-CM | POA: Diagnosis not present

## 2018-10-09 DIAGNOSIS — E663 Overweight: Secondary | ICD-10-CM | POA: Diagnosis not present

## 2018-10-12 DIAGNOSIS — I10 Essential (primary) hypertension: Secondary | ICD-10-CM | POA: Diagnosis not present

## 2018-10-12 DIAGNOSIS — E782 Mixed hyperlipidemia: Secondary | ICD-10-CM | POA: Diagnosis not present

## 2018-10-12 DIAGNOSIS — E1121 Type 2 diabetes mellitus with diabetic nephropathy: Secondary | ICD-10-CM | POA: Diagnosis not present

## 2018-10-12 DIAGNOSIS — Z202 Contact with and (suspected) exposure to infections with a predominantly sexual mode of transmission: Secondary | ICD-10-CM | POA: Diagnosis not present

## 2018-10-12 DIAGNOSIS — M7918 Myalgia, other site: Secondary | ICD-10-CM | POA: Diagnosis not present

## 2018-10-19 DIAGNOSIS — Z202 Contact with and (suspected) exposure to infections with a predominantly sexual mode of transmission: Secondary | ICD-10-CM | POA: Diagnosis not present

## 2018-10-27 ENCOUNTER — Telehealth: Admit: 2018-10-27 | Discharge: 2018-10-28 | Payer: MEDICARE | Attending: Gastroenterology | Primary: Gastroenterology

## 2018-10-27 DIAGNOSIS — K5080 Crohn's disease of both small and large intestine without complications: Secondary | ICD-10-CM | POA: Diagnosis not present

## 2018-10-27 MED ORDER — FLUCONAZOLE 100 MG TABLET
ORAL_TABLET | Freq: Every day | ORAL | 2 refills | 0.00000 days | Status: CP
Start: 2018-10-27 — End: 2018-11-10

## 2018-10-27 MED ORDER — ALPRAZOLAM 0.5 MG TABLET
ORAL_TABLET | Freq: Three times a day (TID) | ORAL | 2 refills | 0 days | Status: CP | PRN
Start: 2018-10-27 — End: ?

## 2018-10-27 NOTE — Unmapped (Signed)
[facility] TELEHEALTH VISIT     Brenda Beard is a 53 y.o. female with a history of:     Diagnosis ICD-10-CM Associated Orders   1. Crohn's disease of both small and large intestine without complication (CMS-HCC) K50.80        Identification: Pt self identified using name and date of birth  Patient location: Home  Type of Visit: Non-EPIC video  Time: 16.26 minutes.   The limitations of this telemedicine encounter were discussed with patient. Both the patient and myself agreed to this encounter despite these limitations. Benefits of this telemedicine encounter included allowing for continued care of patient and minimizing risk of exposure to COVID-19. Patient also aware that this is a billable encounter with possible copay.       10/27/2018.     Patient Care Team:  Jules Husbands, MD as PCP - General  Lossie Faes, RN as Registered Nurse  [name], MD (Gastroenterology)     ACTIVE GASTROINTESTINAL ISSUES:  1.  History of recurrent perirectal abscesses.  2.  Longstanding history of Crohn's disease of the ileum that dates back to 1989.  3.  History of ileocolic resection 2000  4.  Colonoscopy on 08/24/2009 with no dysplasia.  5.  History of chronic Remicade therapy.  6.  History of hypertension.  7.  History of low B12 level, on supplementation.  8.  History of arthralgias on Remicade. .  9.  Ustekinumab trial 2014-2018  10. Commercial ustekinumab started in 2019.   11. Colonoscopy November 2018 - no active inflammation.     INTERVAL HISTORY    Brenda Beard presents for telehealth video visit today.Brenda Beard is a 53 year old woman with long-standing ileocolonic Crohn's disease as well as some rectal issues.  She was on Remicade for a prolonged period of time and did quite well.  Due to insurance reasons she ended up having to come off of the Remicade and was eventually transitioned to ustekinumab as part of the ustekinumab trial.  She did very well on the ustekinumab and at the end of the trial she was transitioned to commercial ustekinumab.  There were initially some financial issues in staying with ustekinumab but this was sorted out last year and likely will come up again at her next renewal.  She actually is doing very well.  A couple of months ago she was having problems with some swelling and what sounded like an early abscess in her perirectal area.  This was treated with ciprofloxacin and completely resolved.  She is continuing on phentermine for weight loss.  She has had significant weight loss and is down to about 160 pounds on the phentermine.  She is now taking it only about once per week and her weight has remained stable.  She had been having trouble with constipation and we did a anorectal manometry study that demonstrated normal pelvic floor EMG at rest with slightly diminished activity with the squeeze maneuver.  There was no indication for pelvic floor retraining.  She actually feels like she is doing fairly well with the constipation.  The phentermine does make her constipated.  Overall she feels like she is doing well with her bowel movements and is not having any loose or runny bowel movements.  The Stelara seems to be working quite well.  She did get the first of the pneumonia vaccinations which it sounds like was Prevnar 13.  She states that it made her fairly sick.  She felt like she had  the flu.  She has not gotten the Pneumovax or the shingles vaccines yet.  She saw her family doctor recently and she was told that her labs including her kidney and liver function were all normal.  Her A1c was 5.4 and her total cholesterol was 177.  Her cholesterol has markedly improved with the weight loss.  These lab values were done at Dr. Renea Ee office.  She presents today for video follow-up.     PAST MEDICAL HISTORY    Past Medical History:   Diagnosis Date   ??? Constipation    ??? Crohn's disease (CMS-HCC)    ??? DDD (degenerative disc disease), lumbar    ??? Diabetes mellitus (CMS-HCC)     Borderline, doesn't take meds and doesn't check sugars.  Diet managed.   ??? H/O hypotension     States occurred post hysterectomy and colon surgery.   ??? Heart murmur    ??? High cholesterol    ??? Hypertension    ??? Incomplete defecation     intermittent   ??? Joint pain    ??? Rectocele    ??? Straining during bowel movements        Past Surgical History:   Procedure Laterality Date   ??? ANAL FISSURECTOMY  [place]   ??? APPENDECTOMY      Childhood   ??? BACK SURGERY     ??? COLON SURGERY  1992 and 1999    small intestine-total 20 inches removed   ??? EYE SURGERY  2017    cataract surgery    ??? HEMORRHOID SURGERY  1992   ??? HYSTERECTOMY      Complete   ??? PR ANAL PRESSURE RECORD N/A 05/01/2018    Procedure: ANORECTAL MANOMETRY;  Surgeon: Nurse-Based Giproc;  [facility];  Service: Gastroenterology   ??? PR COLONOSCOPY W/BIOPSY SINGLE/MULTIPLE  03/17/2013    Procedure: COLONOSCOPY, FLEXIBLE, PROXIMAL TO SPLENIC FLEXURE; WITH BIOPSY, SINGLE OR MULTIPLE;  Surgeon: Teodoro Spray, MD;  Location: [facility];  Service: Gastroenterology   ??? PR COLONOSCOPY W/BIOPSY SINGLE/MULTIPLE N/A 03/13/2017    Procedure: COLONOSCOPY, FLEXIBLE, PROXIMAL TO SPLENIC FLEXURE; WITH BIOPSY, SINGLE OR MULTIPLE;  Surgeon: [name], MD;  [facility];  Service: Gastroenterology   ??? PR LAMINEC/FACETECT/FORAMIN,LUMBAR 1 SEG Bilateral 05/31/2015    Procedure: L4-5 HEMILAMINECTOMY, DISCECTOMY;  Surgeon: Malachy Chamber, MD;  Location: OR [facility];  Service: Orthopedics   ??? TONSILLECTOMY          IBD HISTORY    Date of Diagnosis: Approximately 1990  Crohn's disease Behavior: penetrating  Crohn's Location: Ileocolonic and perineal  Extraintestinal Manifestations: Arthralgias.  Prior IBD Medications: Remicade, - pancreatitis, cellcept, ustekinumab (current)  TPMT Status: Unknown      Vienna Classification of Crohn's disease    Age at diagnosis  A1 < 40    Disease distribution  L3 Ileocolonic    Disease Behavior  B3 penetrating      CURRENT MEDICATIONS    Medications were reviewed with the patient and include:    Current Outpatient Medications   Medication Sig Dispense Refill   ??? ALPRAZolam (XANAX) 0.5 MG tablet Take 1 tablet (0.5 mg total) by mouth Three (3) times a day as needed for sleep. 90 tablet 2   ??? calcium carbonate-vitamin D2 500 mg(1,250mg ) -200 unit tablet Take 1 tablet by mouth nightly.     ??? cyanocobalamin, vitamin B-12, (VITAMIN B-12) 5,000 mcg Subl Place 1  tablet under the tongue nightly.     ??? estradiol (ESTRACE) 1 MG tablet Take 1 mg by mouth nightly.      ??? FUROSEMIDE ORAL Take 10 mg by mouth daily as needed.     ??? gabapentin (NEURONTIN) 300 MG capsule Take 600 mg by mouth nightly.      ??? glucosamine-chondroitin 500-400 mg tablet Take 2 tablets by mouth nightly.      ??? NYSTATIN ORAL Take by mouth.     ??? omega-3 fatty acids-fish oil (FISH OIL) 300-1,000 mg cap Take 1 capsule by mouth daily. Last taken 05/26/15     ??? rosuvastatin (CRESTOR) 10 MG tablet Take 10 mg by mouth 3 (three) times a week.      ??? UNABLE TO FIND Take 1,000 mg by mouth two (2) times a day. Med Name: tumeric     ??? UNABLE TO FIND Take 1 capsule by mouth two (2) times a day. Med Name: cherry extract     ??? ustekinumab (STELARA) 90 mg/mL Syrg syringe INJECT THE CONTENTS OF 1 SYRINGE (90MG ) UNDER THE SKIN EVERY 8 WEEKS 1 mL 5   ??? cholecalciferol, vitamin D3, 125 mcg (5,000 unit) tablet Take 5,000 Units by mouth daily.     ??? ciprofloxacin HCl (CIPRO) 500 MG tablet Take 1 tablet (500 mg total) by mouth every twelve (12) hours. For 10 days (Patient not taking: Reported on 10/26/2018) 20 each 3   ??? fluconazole (DIFLUCAN) 100 MG tablet Take 1 tablet (100 mg total) by mouth daily for 14 days. 14 tablet 2     No current facility-administered medications for this visit.            Allergies:    Allergies   Allergen Reactions   ??? Mercaptopurine Other (See Comments)     Other reaction(s): Pancreatitis.  Used to treat pt. Crohn's Disease. Caused Pancreatis 1992.       Other reaction(s): Other (See Comments)  Used to treat pt. Crohn's Disease.  Caused Pancreatis 1992.   ??? Metoprolol Other (See Comments)     Extreme fatigue   ??? Morphine Itching     States can take short term.   ??? Penicillins Rash              Family History   Problem Relation Age of Onset   ??? Hypertension Mother    ??? Diabetes Mother    ??? Heart disease Mother         CHF   ??? Atrial fibrillation Mother    ??? Atrial fibrillation Father    ??? Diabetes Father         Social History     Socioeconomic History   ??? Marital status: Married     Spouse name: Not on file   ??? Number of children: Not on file   ??? Years of education: Not on file   ??? Highest education level: Not on file   Occupational History   ??? Not on file   Social Needs   ??? Financial resource strain: Not on file   ??? Food insecurity     Worry: Not on file     Inability: Not on file   ??? Transportation needs     Medical: Not on file     Non-medical: Not on file   Tobacco Use   ??? Smoking status: Former Smoker     Packs/day: 1.00     Years: 12.00     Pack years: 12.00  Last attempt to quit: 03/17/2001     Years since quitting: 17.6   ??? Smokeless tobacco: Never Used   Substance and Sexual Activity   ??? Alcohol use: Yes     Comment: maybe once a month   ??? Drug use: No   ??? Sexual activity: Not on file   Lifestyle   ??? Physical activity     Days per week: Not on file     Minutes per session: Not on file   ??? Stress: Not on file   Relationships   ??? Social Wellsite geologist on phone: Not on file     Gets together: Not on file     Attends religious service: Not on file     Active member of club or organization: Not on file     Attends meetings of clubs or organizations: Not on file     Relationship status: Not on file   Other Topics Concern   ??? Not on file   Social History Narrative   ??? Not on file        REVIEW OF SYSTEMS  Gen: no fever, chills, intentional weight loss -now down to 160 pounds???taking less of the phentermine  HEENT: no epistaxis, no oral ulcers  Eyes: no jaundice  CV: no chest pain, no orthopnea  PULM: no shortness of breath, no cough  ABD: 1 bowel movement per day, sometimes constipated.  Constipation is worse with the phentermine.  MSK: arthralgias mostly hips and knees  Skin: no rash, no pruritis  Neuro: no headache, no confusion  Psych: doing well.     PHYSICAL EXAMINATION  Vitals:   There were no vitals filed for this visit.  Wt Readings from Last 4 Encounters:   05/01/18 82.6 kg (182 lb)   04/28/18 84.4 kg (186 lb)   10/14/17 86 kg (189 lb 9.5 oz)   04/14/17 91.9 kg (202 lb 8 oz)     Vital signs not done as this was a telehealth visit  Physical exam limited to observation only  Weight: 160 pounds by her report  Constitutional: Appears well, no apparent distress  HENT: Head: Normocephalic, atraumatic               Neck: no visible thyroid enlargement  Eyes: Extraocular movements intact  Pulmonary: Normal respiratory effort  Cardiovascular: No visible edema  Musculoskeletal: Appears normal, no swelling or deformity  Skin: Normal color and no visible rash  Neurologic: Alert and oriented x3, cranial nerves grossly intact  Psychiatric: normal mood, affect and behaviour    RECENT LABS  Hemoglobin A1c is 5.4 total cholesterol 177???I will need to get the rest of the labs from Dr. Renea Ee office.    Assessment and Plan:    Brenda Beard is a 53 y.o. female with a history of ileocolonic Crohn's disease and perineal disease who did very well on the ustekinumab trial and has been transitioned to commercial ustekinumab.  We will continue the ustekinumab as long as the finances work out.  If there become difficulties with the finances we may need to consider an alternative medication or possible enrollment into 1 of the other clinical trials.  Right now she is doing well and is able to get the medication.  Likely this will need to be readdressed at the beginning of the new year.  I will need to obtain the lab values from Dr. Renea Ee office. She has ciprofloxacin on hand should she have recurrent problems with the  perirectal area.  I sent in her Xanax and her backup fluconazole today.  Of note anorectal manometry suggested the pelvic floor retraining would not be helpful and we will not pursue this.  She will return to clinic in about 3 to 4 months to meet Dr. Alben Spittle.     PLAN  1. Continue ustekinumab  2.  Return to clinic in 3 to 4 months to meet Dr. Alben Spittle  3.  Colonoscopy for colorectal cancer screening due in 2023  4.  We will need to obtain lab results from Dr. Renea Ee office  5.  She is to call if she has any problems prior to her next visit.  6.  Keep ciprofloxacin in backup if the perirectal issue recurs  7.  Xanax and fluconazole sent in.      I spent 16 minutes on the audio/video with the patient. I spent an additional 20 minutes on pre- and post-visit activities.     The patient was physically located in [place] or a state in which I am permitted to provide care. The patient and/or parent/guardian understood that s/he may incur co-pays and cost sharing, and agreed to the telemedicine visit. The visit was reasonable and appropriate under the circumstances given the patient's presentation at the time.    The patient and/or parent/guardian has been advised of the potential risks and limitations of this mode of treatment (including, but not limited to, the absence of in-person examination) and has agreed to be treated using telemedicine. The patient's/patient's family's questions regarding telemedicine have been answered.     If the phone/video visit was completed in an ambulatory setting, the patient and/or parent/guardian has also been advised to contact their provider???s office for worsening conditions, and seek emergency medical treatment and/or call 911 if the patient deems either necessary.        Ravenna Legore L. Erma Heritage, MD PhD  Professor of Medicine  Division of Gastroenterology and Hepatology  [id number], Room [address] [place], [place] [address]  Phone: [phone]  [place] consultation line: [phone]       Return in about 3 months (around 01/27/2019) for Follow up with Dr. Alben Spittle.     Patient Instructions   1.  Prescription sent in for your Xanax and Diflucan  2.  Keep the ciprofloxacin on hand if the perirectal issue recurs  3.  Colonoscopy in 2023 for colorectal cancer screening  4.  I will set up a 3 to 66-month follow-up visit with Dr. Alben Spittle to establish care.   5.  Sherrilyn Rist will still be your nurse and will help with the Stelara renewals  6.  We will continue the Stelara.  7.  It sounds like you did have the Prevnar 13 that you had a reaction with.  You will be due Pneumovax which is usually better tolerated  8.  I would recommend the Shingrix vaccine at some point.  9.  Call if any problems         No orders of the defined types were placed in this encounter.

## 2018-10-27 NOTE — Unmapped (Addendum)
1.  Prescription sent in for your Xanax and Diflucan  2.  Keep the ciprofloxacin on hand if the perirectal issue recurs  3.  Colonoscopy in 2023 for colorectal cancer screening  4.  I will set up a 3 to 75-month follow-up visit with Dr. Alben Spittle to establish care.   5.  Sherrilyn Rist will still be your nurse and will help with the Stelara renewals  6.  We will continue the Stelara.  7.  It sounds like you did have the Prevnar 13 that you had a reaction with.  You will be due Pneumovax which is usually better tolerated  8.  I would recommend the Shingrix vaccine at some point.  9.  Call if any problems

## 2018-11-04 NOTE — Unmapped (Signed)
[facility] Refill Coordination Note    Specialty Medication(s) to be Shipped:   General Specialty: stelara 90mg /ml    Other medication(s) to be shipped:       Brenda Beard, DOB: 06-Jan-1966  Phone: [phone] (home)       All above HIPAA information was verified with patient.     Completed refill call assessment today to schedule patient's medication shipment from the [facility] [phone]).       Specialty medication(s) and dose(s) confirmed: Regimen is correct and unchanged.   Changes to medications: [name] reports no changes at this time.  Changes to insurance: No  Questions for the pharmacist: No    Confirmed patient received Welcome Packet with first shipment. The patient will receive a drug information handout for each medication shipped and additional FDA Medication Guides as required.       DISEASE/MEDICATION-SPECIFIC INFORMATION        N/A    SPECIALTY MEDICATION ADHERENCE     Medication Adherence    Patient reported X missed doses in the last month:  0  Specialty Medication:  stelara 90mg /ml  Patient is on additional specialty medications:  No                Stelara 90 mg/ml: 0 days of medicine on hand         SHIPPING     Shipping address confirmed in Epic.     Delivery Scheduled: Yes, Expected medication delivery date: 07/07.     Medication will be delivered via UPS to the home address in Epic [facility].    Brenda Beard   [facility] Specialty Technician

## 2018-11-16 MED FILL — STELARA 90 MG/ML SUBCUTANEOUS SYRINGE: 56 days supply | Qty: 1 | Fill #3 | Status: AC

## 2018-11-16 MED FILL — STELARA 90 MG/ML SUBCUTANEOUS SYRINGE: 56 days supply | Qty: 1 | Fill #3

## 2018-12-08 DIAGNOSIS — M5432 Sciatica, left side: Secondary | ICD-10-CM | POA: Diagnosis not present

## 2018-12-08 DIAGNOSIS — M4726 Other spondylosis with radiculopathy, lumbar region: Secondary | ICD-10-CM | POA: Diagnosis not present

## 2018-12-08 DIAGNOSIS — M4326 Fusion of spine, lumbar region: Secondary | ICD-10-CM | POA: Diagnosis not present

## 2019-01-06 NOTE — Unmapped (Signed)
[facility] Clinical Assessment & Refill Coordination Note    Brenda Beard, DOB: Sep 10, 1965  Phone: [phone] (home)     All above HIPAA information was verified with patient.     Specialty Medication(s):   Inflammatory Disorders: Stelara     Current Outpatient Medications   Medication Sig Dispense Refill   ??? ALPRAZolam (XANAX) 0.5 MG tablet Take 1 tablet (0.5 mg total) by mouth Three (3) times a day as needed for sleep. 90 tablet 2   ??? calcium carbonate-vitamin D2 500 mg(1,250mg ) -200 unit tablet Take 1 tablet by mouth nightly.     ??? cholecalciferol, vitamin D3, 125 mcg (5,000 unit) tablet Take 5,000 Units by mouth daily.     ??? ciprofloxacin HCl (CIPRO) 500 MG tablet Take 1 tablet (500 mg total) by mouth every twelve (12) hours. For 10 days (Patient not taking: Reported on 10/26/2018) 20 each 3   ??? cyanocobalamin, vitamin B-12, (VITAMIN B-12) 5,000 mcg Subl Place 1 tablet under the tongue nightly.     ??? estradiol (ESTRACE) 1 MG tablet Take 1 mg by mouth nightly.      ??? FUROSEMIDE ORAL Take 10 mg by mouth daily as needed.     ??? gabapentin (NEURONTIN) 300 MG capsule Take 600 mg by mouth nightly.      ??? glucosamine-chondroitin 500-400 mg tablet Take 2 tablets by mouth nightly.      ??? NYSTATIN ORAL Take by mouth.     ??? omega-3 fatty acids-fish oil (FISH OIL) 300-1,000 mg cap Take 1 capsule by mouth daily. Last taken 05/26/15     ??? rosuvastatin (CRESTOR) 10 MG tablet Take 10 mg by mouth 3 (three) times a week.      ??? UNABLE TO FIND Take 1,000 mg by mouth two (2) times a day. Med Name: tumeric     ??? UNABLE TO FIND Take 1 capsule by mouth two (2) times a day. Med Name: cherry extract     ??? ustekinumab (STELARA) 90 mg/mL Syrg syringe INJECT THE CONTENTS OF 1 SYRINGE (90MG ) UNDER THE SKIN EVERY 8 WEEKS 1 mL 5     No current facility-administered medications for this visit.         Changes to medications: Sheretha reports no changes at this time.    Allergies   Allergen Reactions   ??? Mercaptopurine Other (See Comments)     Other reaction(s): Pancreatitis.  Used to treat pt. Crohn's Disease.  Caused Pancreatis 1992.       Other reaction(s): Other (See Comments)  Used to treat pt. Crohn's Disease.  Caused Pancreatis 1992.   ??? Metoprolol Other (See Comments)     Extreme fatigue   ??? Morphine Itching     States can take short term.   ??? Penicillins Rash             Changes to allergies: No    SPECIALTY MEDICATION ADHERENCE     Stelara 90 mg/ml: 0 days of medicine on hand     Medication Adherence    Patient reported X missed doses in the last month: 0  Specialty Medication: Stelara 90mg /ml          Specialty medication(s) dose(s) confirmed: Regimen is correct and unchanged.     Are there any concerns with adherence? No    Adherence counseling provided? Not needed    CLINICAL MANAGEMENT AND INTERVENTION      Clinical Benefit Assessment:    Do you feel the medicine  is effective or helping your condition? Yes    Clinical Benefit counseling provided? Not needed    Adverse Effects Assessment:    Are you experiencing any side effects? No    Are you experiencing difficulty administering your medicine? No    Quality of Life Assessment:    How many days over the past month did your Crohn's Disease keep you from your normal activities? For example, brushing your teeth or getting up in the morning. Patient declined to answer    Have you discussed this with your provider? Not needed    Therapy Appropriateness:    Is therapy appropriate? Yes, therapy is appropriate and should be continued    DISEASE/MEDICATION-SPECIFIC INFORMATION      For patients on injectable medications: Patient currently has 0 doses left.  Next injection is scheduled for 01/18/2019.    PATIENT SPECIFIC NEEDS     ? Does the patient have any physical, cognitive, or cultural barriers? No    ? Is the patient high risk? No     ? Does the patient require a Care Management Plan? No     ? Does the patient require physician intervention or other additional services (i.e. nutrition, smoking cessation, social work)? No      SHIPPING     Specialty Medication(s) to be Shipped:   Inflammatory Disorders: Stelara 90mg /ml    Other medication(s) to be shipped: none       Changes to insurance: No    Delivery Scheduled: Yes, Expected medication delivery date: 01/15/2019.     Medication will be delivered via UPS to the confirmed home address in [facility].    The patient will receive a drug information handout for each medication shipped and additional FDA Medication Guides as required.  Verified that patient has previously received a Conservation officer, historic buildings.    All of the patient's questions and concerns have been addressed.    Karene Fry Joleene Burnham   [facility] Specialty Pharmacist

## 2019-01-14 MED FILL — STELARA 90 MG/ML SUBCUTANEOUS SYRINGE: 56 days supply | Qty: 1 | Fill #4

## 2019-01-14 MED FILL — STELARA 90 MG/ML SUBCUTANEOUS SYRINGE: 56 days supply | Qty: 1 | Fill #4 | Status: AC

## 2019-02-11 MED ORDER — HYDROCORTISONE ACETATE 25 MG RECTAL SUPPOSITORY
Freq: Every evening | RECTAL | 1 refills | 12.00000 days | Status: CP
Start: 2019-02-11 — End: ?

## 2019-02-16 DIAGNOSIS — K611 Rectal abscess: Secondary | ICD-10-CM

## 2019-02-16 MED ORDER — CIPROFLOXACIN 500 MG TABLET
Freq: Two times a day (BID) | ORAL | 0 refills | 7.00000 days | Status: CP
Start: 2019-02-16 — End: 2019-02-23

## 2019-02-16 NOTE — Unmapped (Addendum)
Spoke with pt today regarding her perianal disease. She has been [employer] Dr Alben Spittle regarding her rectal pressure and discomfort, Dr Erma Heritage gave her cipro to have on hand for any perianal issues which she started about 7 days ago - today is the last day. She feels she has a fistula and abscess which is now improving, started draining (a little) yesterday after she pushed on it in the shower. Descirbes as knot and size of pea, has refill on cipro, wants to continues antibiotic for another week. She feels she may have a had a fever a week ago when she was at the beach, but denies fever/chills now.   Continues stelara, last took on 9/8 and has no other GI complaints   Pt will see Dr Alben Spittle on 10/13 at 8am in person visit.

## 2019-02-22 NOTE — Unmapped (Signed)
[facility]  CONSULT NOTE - INFLAMMATORY BOWEL DISEASE  02/23/2019    Demographics:  Brenda Beard is a 53 y.o. year old female    Referring physician:   Jules Husbands, MD  [name]  [name]  [place],  [place] [address]          HPI / NOTE :     Today, I saw Brenda Beard for follow up consultation in the [facility] at the request of Dr. Sedalia Muta regarding Crohn's Disease.  Prior records including available clinical notes, endoscopy reports, imaging results and pathology results were reviewed in detail as part of this follow up consultation.     CC: bump on bottom getting better     HPI:  Brenda Beard is a 53 year old woman with long-standing ileocolonic Crohn's disease as well as some rectal issues. She was on Remicade for a prolonged period of time and did quite well. Due to insurance reasons she ended up having to come off of the Remicade and was eventually transitioned to ustekinumab as part of the ustekinumab trial. She did very well on the ustekinumab and at the end of the trial she was transitioned to commercial ustekinumab which she has been maintained on for several years at this point. She was vacationing at [facility] near [facility] as she has a condo there and noticed that a place came up on her bottom. It felt like a marble-sized knot. She mashed on the area and started herself on ciprofloxacin. The area didn't open up at the beach but after she got home she did have some drainage of yellow purulent material and blood. This was a very small amount but her pain nearly completely resolved after this. She did still have a little pressure in her rectum and has been using suppositories which have been helpful. She has not had any further drainage and is completing her ciprofloxacin. She has been on Flagyl in the past but says she had some neuropathic symptoms. She also hasn't been doing sitz baths lately. She denies any pain currently in the perirectal area.    Brenda Beard talked to her 02/16/19 - she had been sending MyChart messages about her perianal disease. She was away on vacation but I gave her Rx for ciprofloxacin as well as hydrocortisone suppositories per her request.     She has noticed a slight increase in BM frequency. She is having 3-4 BMs per day. Previously was having 2 BMs per day. BMs are formed and are not loose. Frequency dependent on what she is eating. Denies blood in stools or urgency. She does have rare lower abdominal pain.     Review of Systems: positive as per HPI   Otherwise, the balance of 10 systems is negative.          IBD HISTORY:     Year of disease onset:  1989  Diagnosis:  Crohn's Disease  Age at onset:   35-40 yr old (A2)  Location:  Ileocolonic (L3)  Behavior:  Penetrating (B3)  - per Dr. Erma Heritage notes, I do not have access to these earlier records   Perianal Dz:  Yes    Brief IBD Disease Course: 1989 diagnosis ileal Crohn's disease. Ultimately progressed to ileocolonic disease with perineal involvement with recurrent perirectal abscesses. 2000 ileocolic resection. Prior treatment with 6-MP resulting in pancreatitis, Cellcept. 2002 start Remicade which she remained on for approximately 10 years, she did have arthralgias on Remicade. 09/2011 insurance  issues no longer able to get Remicade. 02/2012 enrolled in ustekinumab trial from 2014 - 2018. 06/2016 rectal abscess which spontaneously drained. 03/2017 colon with endoscopic remission. 2019 commercial ustekinumab started. 02/2019 ?perirectal abscess that spontaneous drained, treated with cipro.    *Reports prior history of neuropathy with flagyl  *History of hysterectomy        Endoscopy:      Colon 03/13/17: Patent ileocolonic anastomosis. Diverticulosis in sigmoid colon and descending colon. Nodule in descending colon. Biopsied. Excess tissue in rectum raising question of rectal prolapse.    Colon 03/17/13: Anal canal stenosis. Patent ileocolonic anastomosis. Diverticulosis in left colon. 4 mm polyp in descending colon. Otherwise normal.    Colon 04/30/12: Anal canal stenosis, dilated to 18 mm with Hegar dilator. Aphthous ulceration in TI. Marland Kitchen Moderately congested mucosa in rectosigmoid. Patent ileocolonic anastomosis in ascending colon.     Colon 03/05/12: Anal stricture. Diverticulosis in left colon. Erythematous mucosa in the rectum. Aphthae in rectum. Patent anastomosis.     Colon 08/24/09: Nodule in rectum, biopsied. Diverticulosis left colon, transverse colon. Patent ileocolonic anastomosis.     Colon 03/29/05: Ileocolonic anastomosis in ascending colon. Otherwise normal colon.     Imaging:    Colon 02/28/03: Normal TI. Ileocolonic anastomosis in ascending colon. Normal colon. Colon 10/30/00: Featureless mucosa in rectum. Ulceration at the ileocolonic anastomosis that was superficial and circumferential. Neoterminal ileum with a few scattered aphthous ulcerations.     Prior IBD medications (type, dose, duration, response):  - 6-MP: pancreatitis  - Remicade: treated for many years until insurance stopped covering, did have arthralgias on this therapy as well  - Cellcept: details not known  - Stelara: [facility] clinical trial as well as commercial product           Past Medical History:   Past medical history:   Past Medical History:   Diagnosis Date   ??? Constipation    ??? Crohn's disease (CMS-HCC)    ??? DDD (degenerative disc disease), lumbar    ??? Diabetes mellitus (CMS-HCC)     Borderline, doesn't take meds and doesn't check sugars.  Diet managed.   ??? H/O hypotension     States occurred post hysterectomy and colon surgery.   ??? Heart murmur    ??? High cholesterol    ??? Hypertension    ??? Incomplete defecation     intermittent   ??? Joint pain    ??? Rectocele    ??? Straining during bowel movements      Past surgical history:   Past Surgical History:   Procedure Laterality Date   ??? ANAL FISSURECTOMY  2007    [place]   ??? APPENDECTOMY      Childhood   ??? BACK SURGERY     ??? COLON SURGERY  1992 and 1999    small intestine-total 20 inches removed   ??? EYE SURGERY  2017    cataract surgery    ??? HEMORRHOID SURGERY  1992   ??? HYSTERECTOMY      Complete   ??? PR ANAL PRESSURE RECORD N/A 05/01/2018    Procedure: ANORECTAL MANOMETRY;  Surgeon: Nurse-Based Giproc;  [facility];  Service: Gastroenterology   ??? PR COLONOSCOPY W/BIOPSY SINGLE/MULTIPLE  03/17/2013    Procedure: COLONOSCOPY, FLEXIBLE, PROXIMAL TO SPLENIC FLEXURE; WITH BIOPSY, SINGLE OR MULTIPLE;  Surgeon: Teodoro Spray, MD;  [facility]: [facility];  Service: Gastroenterology   ??? PR COLONOSCOPY W/BIOPSY SINGLE/MULTIPLE N/A 03/13/2017 Procedure: COLONOSCOPY, FLEXIBLE, PROXIMAL  TO SPLENIC FLEXURE; WITH BIOPSY, SINGLE OR MULTIPLE;  Surgeon: [name], MD;  [facility];  Service: Gastroenterology   ??? PR LAMINEC/FACETECT/FORAMIN,LUMBAR 1 SEG Bilateral 05/31/2015    Procedure: L4-5 HEMILAMINECTOMY, DISCECTOMY;  Surgeon: Malachy Chamber, MD;  Location: OR [facility];  Service: Orthopedics   ??? TONSILLECTOMY       Family history:   Family History   Problem Relation Age of Onset   ??? Hypertension Mother    ??? Diabetes Mother    ??? Heart disease Mother         CHF   ??? Atrial fibrillation Mother    ??? Atrial fibrillation Father    ??? Diabetes Father      Social history: Widowed, husband died several years ago. Lives in [place], [place] but also has condo at [place]  (near [employer]  Social History     Socioeconomic History   ??? Marital status: Married     Spouse name: None   ??? Number of children: None   ??? Years of education: None   ??? Highest education level: None   Occupational History   ??? None   Social Needs   ??? Financial resource strain: None   ??? Food insecurity     Worry: None     Inability: None   ??? Transportation needs     Medical: None     Non-medical: None   Tobacco Use   ??? Smoking status: Former Smoker     Packs/day: 1.00     Years: 12.00     Pack years: 12.00     Quit date: 03/17/2001     Years since quitting: 17.9   ??? Smokeless tobacco: Never Used   Substance and Sexual Activity   ??? Alcohol use: Yes     Comment: maybe once a month   ??? Drug use: No   ??? Sexual activity: None   Lifestyle   ??? Physical activity     Days per week: None     Minutes per session: None   ??? Stress: None   Relationships   ??? Social Wellsite geologist on phone: None     Gets together: None     Attends religious service: None     Active member of club or organization: None     Attends meetings of clubs or organizations: None     Relationship status: None   Other Topics Concern   ??? None   Social History Narrative   ??? None             Allergies: Allergies   Allergen Reactions   ??? Mercaptopurine Other (See Comments)     Other reaction(s): Pancreatitis.  Used to treat pt. Crohn's Disease.  Caused Pancreatis 1992.       Other reaction(s): Other (See Comments)  Used to treat pt. Crohn's Disease.  Caused Pancreatis 1992.   ??? Metoprolol Other (See Comments)     Extreme fatigue   ??? Morphine Itching     States can take short term.   ??? Penicillins Rash                   Medications:     Current Outpatient Medications   Medication Sig Dispense Refill   ??? ALPRAZolam (XANAX) 0.5 MG tablet Take 1 tablet (0.5 mg total) by mouth Three (3) times a day as needed for sleep. 90 tablet 2   ??? calcium carbonate-vitamin D2 500  mg(1,250mg ) -200 unit tablet Take 1 tablet by mouth nightly.     ??? cholecalciferol, vitamin D3, 125 mcg (5,000 unit) tablet Take 5,000 Units by mouth daily.     ??? ciprofloxacin HCl (CIPRO) 500 MG tablet Take 1 tablet (500 mg total) by mouth every twelve (12) hours. 14 each 0   ??? cyanocobalamin, vitamin B-12, (VITAMIN B-12) 5,000 mcg Subl Place 1 tablet under the tongue nightly.     ??? estradiol (ESTRACE) 1 MG tablet Take 1 mg by mouth nightly.      ??? gabapentin (NEURONTIN) 300 MG capsule Take 600 mg by mouth nightly.      ??? glucosamine-chondroitin 500-400 mg tablet Take 2 tablets by mouth nightly.      ??? omega-3 fatty acids-fish oil (FISH OIL) 300-1,000 mg cap Take 1 capsule by mouth daily. Last taken 05/26/15     ??? UNABLE TO FIND Take 1,000 mg by mouth two (2) times a day. Med Name: tumeric     ??? UNABLE TO FIND Take 1 capsule by mouth two (2) times a day. Med Name: cherry extract     ??? ustekinumab (STELARA) 90 mg/mL Syrg syringe INJECT THE CONTENTS OF 1 SYRINGE (90MG ) UNDER THE SKIN EVERY 8 WEEKS 1 mL 5   ??? FUROSEMIDE ORAL Take 10 mg by mouth daily as needed.     ??? hydrocortisone (ANUSOL-HC) 25 mg suppository Insert 1 suppository (25 mg total) into the rectum nightly. (Patient not taking: Reported on 02/23/2019) 12 each 1 ??? NYSTATIN ORAL Take by mouth.     ??? phentermine 37.5 MG capsule Take 37.5 mg by mouth.     ??? rosuvastatin (CRESTOR) 10 MG tablet Take 10 mg by mouth 3 (three) times a week.        No current facility-administered medications for this visit.              Physical Exam:   BP 149/70  - Pulse 68  - Temp 35.8 ??C (96.5 ??F)  - Resp 18  - Wt 76.1 kg (167 lb 11.2 oz)  - BMI 27.07 kg/m??     GEN: no apparent distress, appears comfortable on exam  HEENT:EOMI, no scleral icterus   NEURO:  gait normal, non-focal, no obvious neurologic abnormality  LUNGS: normal work of breathing, no audible wheezing   CV: RRR, no edema   ABD: soft, nondistended, nontender   Extremities: no cyanosis, clubbing or edema, normal gait  Psych: affect appropriate, A&O x3  SKIN: no visible lesions on face, neck, arms, abdomen  PERIANAL / RECTAL EXAM: non-draining chronic fistula tract right gluteal cleft; no obvious abscesses or new fistula tracts visible; no ttp or perirectal area or perineum          Labs, Data & Indices:     Lab Review:   Lab Results   Component Value Date    WBC 7.0 02/23/2019    WBC 11.9 (H) 08/21/2010    RBC 4.76 02/23/2019    RBC 4.48 08/21/2010    HGB 13.8 02/23/2019    HGB 13.2 08/21/2010     Lab Results   Component Value Date    AST 18 02/23/2019    AST 19 08/21/2010    ALT 13 02/23/2019    ALT 29 08/21/2010    BUN 15 02/23/2019    Creatinine 0.72 02/23/2019    CO2 26.0 02/23/2019    Albumin 4.2 02/23/2019    Calcium 9.6 02/23/2019     No results found for:  TSH     ............................................................................................................................................Marland Kitchen          Assessment & Recommendations: Brenda Beard is a 53 y.o. female with history of ileocolonic Crohn's disease with perineal involvement s/p ileocolonic resection in 2000. She was previously treated with infliximab which was stopped due to insurance issues and has been maintained on ustekinumab since 2014. Her last endoscopy in 2018 confirmed that her Crohn's was in remission. She presents today with recent perirectal pressure and a possible perirectal abscess which seems to have resolved on its own with antibiotic therapy. I do see a chronic non-draining fistula tract on her perianal exam today but no obvious areas of fluctuance or induration to suggest abscess. She will complete course of ciprofloxacin (has intolerance to metronidazole d/t prior neuropathic symptoms) and let me know if has recurrence of symptoms. If so will likely have her see GI Surgery to determine if needs EUA vs consider MRI Pelvis. She also does complain of slight increase frequency of BMs although they are formed and not what she would consider diarrhea. We will closely monitor her GI symptoms and see if any improvement with next dose of ustekinumab. Discussed that I would have low threshold to repeat her colonoscopy to reassess Crohn's disease activity particularly in setting of likely recent perirectal abscess.    We also discussed the importance of prevention today including the need for annual influenza vaccine. She will get this through PCP and also will talk with PCP about Pneumovax vaccine. She reports that she did get Prevnar through PCP last year. Given her age and immunosuppression, Shingrix vaccine series would also be recommended.     PLAN:  1. Crohn's disease  - continue Stelara every 8 weeks  - update labs today   - if BM frequency remains increased, recommend colonoscopy to r/o active inflammation  - declines trial of bile acid sequestrant as feels that stools are formed, just increased frequency    2. Perineal disease  - complete course of ciprofloxacin - contact me if worsening symptoms and will refer to GI Surgery for EUA    3. High risk medication monitoring (Stelara)  - update Quant gold, HBV serologies today  - discussed need for at least annual monitoring labs, update today    4. Prevention  - recommend annual influenza vaccine, she will get locally  - received Prevnar 2019 with PCP per pt, recommend Pneumovax  - recommend Shingrix vaccine series   - pap: h/o hysterectomy, need to discuss at next visit whether still has cervix in place  - update vitamin D level today   - CRC surveillance - due in 2023 or sooner if worsening symptoms    5. Follow up in 3 months or sooner as needed  --------------------------------------------  Mardee Postin, MD  [facility]  Multidisciplinary Infl[facility]el Disease Clinic

## 2019-02-23 ENCOUNTER — Ambulatory Visit: Admit: 2019-02-23 | Discharge: 2019-02-24 | Payer: MEDICARE | Attending: Internal Medicine | Primary: Internal Medicine

## 2019-02-23 DIAGNOSIS — Z79899 Other long term (current) drug therapy: Secondary | ICD-10-CM | POA: Diagnosis not present

## 2019-02-23 DIAGNOSIS — E7800 Pure hypercholesterolemia, unspecified: Secondary | ICD-10-CM | POA: Diagnosis not present

## 2019-02-23 DIAGNOSIS — K5730 Diverticulosis of large intestine without perforation or abscess without bleeding: Secondary | ICD-10-CM | POA: Diagnosis not present

## 2019-02-23 DIAGNOSIS — E119 Type 2 diabetes mellitus without complications: Secondary | ICD-10-CM | POA: Diagnosis not present

## 2019-02-23 DIAGNOSIS — K611 Rectal abscess: Secondary | ICD-10-CM | POA: Diagnosis not present

## 2019-02-23 DIAGNOSIS — Z1159 Encounter for screening for other viral diseases: Secondary | ICD-10-CM | POA: Diagnosis not present

## 2019-02-23 DIAGNOSIS — K50818 Crohn's disease of both small and large intestine with other complication: Secondary | ICD-10-CM | POA: Diagnosis not present

## 2019-02-23 DIAGNOSIS — Z5181 Encounter for therapeutic drug level monitoring: Secondary | ICD-10-CM | POA: Diagnosis not present

## 2019-02-23 DIAGNOSIS — M5136 Other intervertebral disc degeneration, lumbar region: Secondary | ICD-10-CM | POA: Diagnosis not present

## 2019-02-23 DIAGNOSIS — R011 Cardiac murmur, unspecified: Secondary | ICD-10-CM | POA: Diagnosis not present

## 2019-02-23 DIAGNOSIS — R197 Diarrhea, unspecified: Secondary | ICD-10-CM | POA: Diagnosis not present

## 2019-02-23 DIAGNOSIS — K610 Anal abscess: Secondary | ICD-10-CM | POA: Diagnosis not present

## 2019-02-23 DIAGNOSIS — Z23 Encounter for immunization: Principal | ICD-10-CM

## 2019-02-23 LAB — HM HEPATITIS C SCREENING LAB: HM Hepatitis Screen: NEGATIVE

## 2019-02-23 LAB — HEPATITIS B SURFACE ANTIGEN
Hepatitis B Surface Ag: NEGATIVE
Hepatitis B virus surface Ag:PrThr:Pt:Ser:Ord:: NONREACTIVE

## 2019-02-23 LAB — CBC W/ AUTO DIFF
BASOPHILS ABSOLUTE COUNT: 0.1 10*9/L (ref 0.0–0.1)
BASOPHILS RELATIVE PERCENT: 0.8 %
EOSINOPHILS ABSOLUTE COUNT: 0.1 10*9/L (ref 0.0–0.4)
HEMATOCRIT: 44.2 % (ref 36.0–46.0)
HEMOGLOBIN: 13.8 g/dL (ref 12.0–16.0)
LARGE UNSTAINED CELLS: 1 % (ref 0–4)
LYMPHOCYTES ABSOLUTE COUNT: 1.9 10*9/L (ref 1.5–5.0)
LYMPHOCYTES RELATIVE PERCENT: 27.6 %
MEAN CORPUSCULAR HEMOGLOBIN CONC: 31.2 g/dL (ref 31.0–37.0)
MEAN CORPUSCULAR HEMOGLOBIN: 29 pg (ref 26.0–34.0)
MEAN CORPUSCULAR VOLUME: 92.9 fL (ref 80.0–100.0)
MEAN PLATELET VOLUME: 8.4 fL (ref 7.0–10.0)
MONOCYTES ABSOLUTE COUNT: 0.4 10*9/L (ref 0.2–0.8)
MONOCYTES RELATIVE PERCENT: 5.2 %
NEUTROPHILS ABSOLUTE COUNT: 4.5 10*9/L (ref 2.0–7.5)
NEUTROPHILS RELATIVE PERCENT: 64.2 %
RED BLOOD CELL COUNT: 4.76 10*12/L (ref 4.00–5.20)
RED CELL DISTRIBUTION WIDTH: 14.2 % (ref 12.0–15.0)
WBC ADJUSTED: 7 10*9/L (ref 4.5–11.0)

## 2019-02-23 LAB — BUN / CREAT RATIO: Urea nitrogen/Creatinine:MRto:Pt:Ser/Plas:Qn:: 21

## 2019-02-23 LAB — HEPATITIS B SURFACE ANTIBODY: Hepatitis B virus surface Ab:PrThr:Pt:Ser:Ord:: NONREACTIVE

## 2019-02-23 LAB — COMPREHENSIVE METABOLIC PANEL
ALBUMIN: 4.2 g/dL (ref 3.5–5.0)
ALKALINE PHOSPHATASE: 75 U/L (ref 38–126)
ALT (SGPT): 13 U/L (ref <35)
ANION GAP: 11 mmol/L (ref 7–15)
AST (SGOT): 18 U/L (ref 14–38)
BILIRUBIN TOTAL: 0.6 mg/dL (ref 0.0–1.2)
BLOOD UREA NITROGEN: 15 mg/dL (ref 7–21)
BUN / CREAT RATIO: 21
CALCIUM: 9.6 mg/dL (ref 8.5–10.2)
CHLORIDE: 103 mmol/L (ref 98–107)
CO2: 26 mmol/L (ref 22.0–30.0)
CREATININE: 0.72 mg/dL (ref 0.60–1.00)
EGFR CKD-EPI AA FEMALE: >90 mL/min/{1.73_m2} (ref >=60)
EGFR CKD-EPI NON-AA FEMALE: >90 mL/min/{1.73_m2} (ref >=60)
POTASSIUM: 4.3 mmol/L (ref 3.5–5.0)
PROTEIN TOTAL: 6.8 g/dL (ref 6.5–8.3)
SODIUM: 140 mmol/L (ref 135–145)

## 2019-02-23 LAB — IRON & TIBC
IRON: 73 ug/dL (ref 35–165)
TOTAL IRON BINDING CAPACITY (CALC): 311.2 mg/dL (ref 252.0–479.0)

## 2019-02-23 LAB — HEPATITIS B CORE TOTAL ANTIBODY: Hepatitis B virus core Ab:PrThr:Pt:Ser/Plas:Ord:IA: NONREACTIVE

## 2019-02-23 LAB — C-REACTIVE PROTEIN: C reactive protein:MCnc:Pt:Ser/Plas:Qn:: 10.2 — ABNORMAL HIGH

## 2019-02-23 LAB — MEAN CORPUSCULAR HEMOGLOBIN: Lab: 29

## 2019-02-23 LAB — HEPATITIS B CORE ANTIBODY, TOTAL: HEPATITIS B CORE TOTAL ANTIBODY: NONREACTIVE

## 2019-02-23 LAB — FERRITIN: Ferritin:MCnc:Pt:Ser/Plas:Qn:: 56.9

## 2019-02-23 LAB — VITAMIN B-12: Cobalamins:MCnc:Pt:Ser/Plas:Qn:: >1000 — ABNORMAL HIGH

## 2019-02-23 LAB — IRON: Iron:MCnc:Pt:Ser/Plas:Qn:: 73

## 2019-02-23 LAB — VITAMIN D, TOTAL (25OH): Lab: 38.9

## 2019-02-23 MED ORDER — CIPROFLOXACIN 500 MG TABLET: 500 mg | each | Freq: Two times a day (BID) | 1 refills | 7 days | Status: AC

## 2019-02-23 NOTE — Unmapped (Signed)
INSTRUCTIONS:     1.  Complete course of ciprofloxacin for perianal drainage  2.  Please let me know if this worsens and we can set you up for an appointment with one of our surgeons  3.  Continue Stelara every 8 weeks - let me know how bowel movement frequency is doing after the next Stelara dose. If no better, we can consider a colonoscopy to evaluate for active inflammation.   4.  Update labs today including TB blood test, hepatitis B blood tests   5.  Recommend annual influenza shot. Also recommend Pneumovax (second pneumonia vaccine) and Shingrix vaccine series.   6.  If any trouble or symptoms, do not hesitate to call.     Mardee Postin, MD  Division of Gastroenterology & Hepatology  [place] [facility] [place]    Clinical Nurse Contact:  Neta Mends, RN  Ph#  [phone]  Fax#  [phone]    For clinic appointments, please call, [phone], option 1.  For questions regarding radiology appointments, or to schedule, [phone].  For questions regarding scheduling GI procedures (e.g,. Colonoscopy), please call, [phone], option 2.    For educational material and resources:  [email or web address]  ============================================

## 2019-02-24 LAB — QUANTIFERON TB GOLD PLUS
QUANTIFERON ANTIGEN 1 MINUS NIL: 0.01 [IU]/mL
QUANTIFERON ANTIGEN 2 MINUS NIL: 0.02 [IU]/mL
QUANTIFERON TB GOLD PLUS: NEGATIVE

## 2019-02-24 LAB — QUANTIFERON ANTIGEN 1 MINUS NIL: Lab: 0.01

## 2019-02-24 LAB — TB AG2 VALUE: Lab: 0.06

## 2019-02-24 LAB — TB MITOGEN VALUE: Lab: 10

## 2019-02-24 LAB — TB AG1 VALUE: Lab: 0.05

## 2019-02-24 LAB — TB NIL VALUE: Lab: 0.04

## 2019-03-02 NOTE — Unmapped (Signed)
[facility] Refill Coordination Note    Specialty Medication(s) to be Shipped:   Inflammatory Disorders: Stelara    Other medication(s) to be shipped: n/a     Brenda Beard, DOB: 1966/01/02  Phone: [phone] (home)       All above HIPAA information was verified with patient.     Completed refill call assessment today to schedule patient's medication shipment from the [facility] [phone]).       Specialty medication(s) and dose(s) confirmed: Regimen is correct and unchanged.   Changes to medications: Brenda Beard reports no changes at this time.  Changes to insurance: No  Questions for the pharmacist: No    Confirmed patient received Welcome Packet with first shipment. The patient will receive a drug information handout for each medication shipped and additional FDA Medication Guides as required.       DISEASE/MEDICATION-SPECIFIC INFORMATION        For patients on injectable medications: Patient currently has 0 doses left.  Next injection is scheduled for 03/16/19.    SPECIALTY MEDICATION ADHERENCE     Medication Adherence    Patient reported X missed doses in the last month: 0  Specialty Medication: stelara 90mg /ml  Patient is on additional specialty medications: No  Patient is on more than two specialty medications: No  Any gaps in refill history greater than 2 weeks in the last 3 months: no  Demonstrates understanding of importance of adherence: yes  Informant: patient                Stelara 90mg /ml: Patient has 0 days of medication on hand      SHIPPING     Shipping address confirmed in Epic.     Delivery Scheduled: Yes, Expected medication delivery date: 03/10/19.     Medication will be delivered via UPS to the home address in Epic WAM.    Brenda Beard   [facility] Specialty Technician

## 2019-03-09 MED FILL — STELARA 90 MG/ML SUBCUTANEOUS SYRINGE: 56 days supply | Qty: 1 | Fill #5 | Status: AC

## 2019-03-09 MED FILL — STELARA 90 MG/ML SUBCUTANEOUS SYRINGE: 56 days supply | Qty: 1 | Fill #5

## 2019-03-26 DIAGNOSIS — R509 Fever, unspecified: Secondary | ICD-10-CM | POA: Diagnosis not present

## 2019-03-26 DIAGNOSIS — J069 Acute upper respiratory infection, unspecified: Secondary | ICD-10-CM | POA: Diagnosis not present

## 2019-03-30 ENCOUNTER — Telehealth: Admit: 2019-03-30 | Discharge: 2019-03-31 | Payer: MEDICARE | Attending: Internal Medicine | Primary: Internal Medicine

## 2019-03-30 DIAGNOSIS — U071 COVID-19: Secondary | ICD-10-CM | POA: Diagnosis not present

## 2019-03-30 DIAGNOSIS — Z79899 Other long term (current) drug therapy: Secondary | ICD-10-CM | POA: Diagnosis not present

## 2019-03-30 DIAGNOSIS — K5090 Crohn's disease, unspecified, without complications: Secondary | ICD-10-CM | POA: Diagnosis not present

## 2019-03-30 DIAGNOSIS — Z5181 Encounter for therapeutic drug level monitoring: Secondary | ICD-10-CM | POA: Diagnosis not present

## 2019-03-30 DIAGNOSIS — K611 Rectal abscess: Secondary | ICD-10-CM | POA: Diagnosis not present

## 2019-03-30 DIAGNOSIS — K610 Anal abscess: Secondary | ICD-10-CM | POA: Diagnosis not present

## 2019-03-30 MED ORDER — CIPROFLOXACIN 500 MG TABLET
Freq: Two times a day (BID) | ORAL | 1 refills | 7.00000 days | Status: CP
Start: 2019-03-30 — End: ?

## 2019-03-30 NOTE — Unmapped (Signed)
[facility]  CONSULT NOTE - INFLAMMATORY BOWEL DISEASE  03/30/2019    Demographics:  Brenda Beard is a 53 y.o. year old female    Referring physician:   Jules Husbands, MD  [name]  [name]  [place],  [place] [address]          HPI / NOTE :     Today, I saw Brenda Beard for follow up consultation in the [facility] at the request of Dr. Sedalia Muta regarding Crohn's Disease.  Prior records including available clinical notes, endoscopy reports, imaging results and pathology results were reviewed in detail as part of this follow up consultation.     CC: I have COVID    HPI:  Brenda Beard is a 53 year old woman with long-standing ileocolonic Crohn's disease as well as some rectal issues who is seen virtually in follow up today. I saw her in person last month for perirectal issues. She is maintained on Stelara with last dose on 03/16/19. She reports that she was just diagnosed with COVID last Friday. She had a fever up to 101F and this has subsided. She feels fatigued, weak, and with upper respiratory symptoms but has not had much in terms of cough or lower respiratory symptoms. She really is not sure how she got COVID as she has been very careful and not going places.     She says that since her last visit she had 2 other areas come up on her bottom and opened up with sitz baths. She was on cipro when she was diagnosed with COVID and she thinks this may have been protective. She does need refill of cipro just in case she has issue with bottom show up again but currently no issues in perirectal area.     She has had some increased BM frequency since her COVID diagnosis. Really since this morning when she took Mucinex is when her symptoms have worsened. Has had 4 BMs today so far, no blood in stools. She also has a headache and is taking Tylenol but no NSAIDs.    She has not yet had her flu shot or Shingrix vaccine as has not been able to get these due to COVID. Review of Systems: positive as per HPI   Otherwise, the balance of 10 systems is negative.          IBD HISTORY:     Year of disease onset:  1989  Diagnosis:  Crohn's Disease  Age at onset:   5-40 yr old (A2)  Location:  Ileocolonic (L3)  Behavior:  Penetrating (B3)  - per Dr. Erma Heritage notes, I do not have access to these earlier records   Perianal Dz:  Yes    Brief IBD Disease Course:    1989 diagnosis ileal Crohn's disease. Ultimately progressed to ileocolonic disease with perineal involvement with recurrent perirectal abscesses. 2000 ileocolic resection. Prior treatment with 6-MP resulting in pancreatitis, Cellcept. 2002 start Remicade which she remained on for approximately 10 years, she did have arthralgias on Remicade. 09/2011 insurance issues no longer able to get Remicade. 02/2012 enrolled in ustekinumab trial from 2014 - 2018. 06/2016 rectal abscess which spontaneously drained. 03/2017 colon with endoscopic remission. 2019 commercial ustekinumab started. 02/2019 ?perirectal abscess that spontaneous drained, treated with cipro; remains on ustekinumab. 03/2019 COVID positive.     *Reports prior history of neuropathy with flagyl  *History of hysterectomy        Endoscopy:  Colon 03/13/17: Patent ileocolonic anastomosis. Diverticulosis in sigmoid colon and descending colon. Nodule in descending colon. Biopsied. Excess tissue in rectum raising question of rectal prolapse.    Colon 03/17/13: Anal canal stenosis. Patent ileocolonic anastomosis. Diverticulosis in left colon. 4 mm polyp in descending colon. Otherwise normal.    Colon 04/30/12: Anal canal stenosis, dilated to 18 mm with Hegar dilator. Aphthous ulceration in TI. Marland Kitchen Moderately congested mucosa in rectosigmoid. Patent ileocolonic anastomosis in ascending colon.     Colon 03/05/12: Anal stricture. Diverticulosis in left colon. Erythematous mucosa in the rectum. Aphthae in rectum. Patent anastomosis. Colon 08/24/09: Nodule in rectum, biopsied. Diverticulosis left colon, transverse colon. Patent ileocolonic anastomosis.     Colon 03/29/05: Ileocolonic anastomosis in ascending colon. Otherwise normal colon.     Imaging:    Colon 02/28/03: Normal TI. Ileocolonic anastomosis in ascending colon. Normal colon.    Colon 10/30/00: Featureless mucosa in rectum. Ulceration at the ileocolonic anastomosis that was superficial and circumferential. Neoterminal ileum with a few scattered aphthous ulcerations.     Prior IBD medications (type, dose, duration, response):  - 6-MP: pancreatitis  - Remicade: treated for many years until insurance stopped covering, did have arthralgias on this therapy as well  - Cellcept: details not known  - Stelara: [facility] clinical trial as well as commercial product           Past Medical History:   Past medical history:   Past Medical History:   Diagnosis Date   ??? Constipation    ??? Crohn's disease (CMS-HCC)    ??? DDD (degenerative disc disease), lumbar    ??? Diabetes mellitus (CMS-HCC)     Borderline, doesn't take meds and doesn't check sugars.  Diet managed.   ??? H/O hypotension     States occurred post hysterectomy and colon surgery.   ??? Heart murmur    ??? High cholesterol    ??? Hypertension    ??? Incomplete defecation     intermittent   ??? Joint pain    ??? Rectocele    ??? Straining during bowel movements      Past surgical history:   Past Surgical History:   Procedure Laterality Date   ??? ANAL FISSURECTOMY  2007    [place]   ??? APPENDECTOMY      Childhood   ??? BACK SURGERY     ??? COLON SURGERY  1992 and 1999    small intestine-total 20 inches removed   ??? EYE SURGERY  2017    cataract surgery    ??? HEMORRHOID SURGERY  1992   ??? HYSTERECTOMY      Complete   ??? PR ANAL PRESSURE RECORD N/A 05/01/2018    Procedure: ANORECTAL MANOMETRY;  Surgeon: Nurse-Based Giproc;  [facility];  Service: Gastroenterology   ??? PR COLONOSCOPY W/BIOPSY SINGLE/MULTIPLE  03/17/2013 Procedure: COLONOSCOPY, FLEXIBLE, PROXIMAL TO SPLENIC FLEXURE; WITH BIOPSY, SINGLE OR MULTIPLE;  Surgeon: Teodoro Spray, MD;  Location: [facility];  Service: Gastroenterology   ??? PR COLONOSCOPY W/BIOPSY SINGLE/MULTIPLE N/A 03/13/2017    Procedure: COLONOSCOPY, FLEXIBLE, PROXIMAL TO SPLENIC FLEXURE; WITH BIOPSY, SINGLE OR MULTIPLE;  Surgeon: [name], MD;  Location: [facility];  Service: Gastroenterology   ??? PR LAMINEC/FACETECT/FORAMIN,LUMBAR 1 SEG Bilateral 05/31/2015    Procedure: L4-5 HEMILAMINECTOMY, DISCECTOMY;  Surgeon: Malachy Chamber, MD;  Location: OR [facility];  Service: Orthopedics   ??? TONSILLECTOMY       Family history:   Family History  Problem Relation Age of Onset   ??? Hypertension Mother    ??? Diabetes Mother    ??? Heart disease Mother         CHF   ??? Atrial fibrillation Mother    ??? Atrial fibrillation Father    ??? Diabetes Father      Social history: Widowed, husband died several years ago. Lives in [place], [place] but also has condo at [place]  (near [employer]  Social History     Socioeconomic History   ??? Marital status: Married     Spouse name: None   ??? Number of children: None   ??? Years of education: None   ??? Highest education level: None   Occupational History   ??? None   Social Needs   ??? Financial resource strain: None   ??? Food insecurity     Worry: None     Inability: None   ??? Transportation needs     Medical: None     Non-medical: None   Tobacco Use   ??? Smoking status: Former Smoker     Packs/day: 1.00     Years: 12.00     Pack years: 12.00     Quit date: 03/17/2001     Years since quitting: 18.0   ??? Smokeless tobacco: Never Used   Substance and Sexual Activity   ??? Alcohol use: Yes     Comment: maybe once a month   ??? Drug use: No   ??? Sexual activity: None   Lifestyle   ??? Physical activity     Days per week: None     Minutes per session: None   ??? Stress: None   Relationships   ??? Social Wellsite geologist on phone: None     Gets together: None Attends religious service: None     Active member of club or organization: None     Attends meetings of clubs or organizations: None     Relationship status: None   Other Topics Concern   ??? None   Social History Narrative   ??? None             Allergies:     Allergies   Allergen Reactions   ??? Mercaptopurine Other (See Comments)     Other reaction(s): Pancreatitis.  Used to treat pt. Crohn's Disease.  Caused Pancreatis 1992.       Other reaction(s): Other (See Comments)  Used to treat pt. Crohn's Disease.  Caused Pancreatis 1992.   ??? Metoprolol Other (See Comments)     Extreme fatigue   ??? Morphine Itching     States can take short term.   ??? Penicillins Rash                   Medications:     Current Outpatient Medications   Medication Sig Dispense Refill   ??? ALPRAZolam (XANAX) 0.5 MG tablet Take 1 tablet (0.5 mg total) by mouth Three (3) times a day as needed for sleep. 90 tablet 2   ??? baloxavir marboxiL (XOFLUZA) 40 mg Tab Take by mouth once. Took 1 tablet once     ??? calcium carbonate-vitamin D2 500 mg(1,250mg ) -200 unit tablet Take 1 tablet by mouth nightly.     ??? cholecalciferol, vitamin D3, 125 mcg (5,000 unit) tablet Take 5,000 Units by mouth daily.     ??? cyanocobalamin, vitamin B-12, (VITAMIN B-12) 5,000 mcg Subl Place 1 tablet under the tongue nightly.     ???  estradiol (ESTRACE) 1 MG tablet Take 1 mg by mouth nightly.      ??? FUROSEMIDE ORAL Take 10 mg by mouth daily as needed.     ??? gabapentin (NEURONTIN) 300 MG capsule Take 600 mg by mouth nightly.      ??? glucosamine-chondroitin 500-400 mg tablet Take 2 tablets by mouth nightly.      ??? hydrocortisone (ANUSOL-HC) 25 mg suppository Insert 1 suppository (25 mg total) into the rectum nightly. 12 each 1   ??? NYSTATIN ORAL Take by mouth.     ??? omega-3 fatty acids-fish oil (FISH OIL) 300-1,000 mg cap Take 1 capsule by mouth daily. Last taken 05/26/15     ??? phentermine 37.5 MG capsule Take 37.5 mg by mouth. ??? rosuvastatin (CRESTOR) 10 MG tablet Take 10 mg by mouth 3 (three) times a week.      ??? UNABLE TO FIND Take 1,000 mg by mouth two (2) times a day. Med Name: tumeric     ??? UNABLE TO FIND Take 1 capsule by mouth two (2) times a day. Med Name: cherry extract     ??? ustekinumab (STELARA) 90 mg/mL Syrg syringe INJECT THE CONTENTS OF 1 SYRINGE (90MG ) UNDER THE SKIN EVERY 8 WEEKS 1 mL 5   ??? ciprofloxacin HCl (CIPRO) 500 MG tablet Take 1 tablet (500 mg total) by mouth every twelve (12) hours. (Patient not taking: Reported on 03/29/2019) 14 each 1     No current facility-administered medications for this visit.              Physical Exam:   There were no vitals taken for this visit.    (PE done via video visit, the following elements were visualed by telehealth):    Constitutional: Appears well, no apparent distress  HENT: Head: Normocephalic, atraumatic               Neck: no visible thyroid enlargement  Eyes: Extraocular movements intact  Pulmonary: Normal respiratory effort  Cardiovascular: No visible edema  Musculoskeletal: Appears normal, no swelling or deformity  Skin: Normal color and no visible rash  Neurologic: Alert and oriented x3, cranial nerves grossly intact  Psychiatric: normal mood, affect and behaviour          Labs, Data & Indices:     Lab Review:   Lab Results   Component Value Date    WBC 7.0 02/23/2019    WBC 11.9 (H) 08/21/2010    RBC 4.76 02/23/2019    RBC 4.48 08/21/2010    HGB 13.8 02/23/2019    HGB 13.2 08/21/2010     Lab Results   Component Value Date    AST 18 02/23/2019    AST 19 08/21/2010    ALT 13 02/23/2019    ALT 29 08/21/2010    BUN 15 02/23/2019    Creatinine 0.72 02/23/2019    CO2 26.0 02/23/2019    Albumin 4.2 02/23/2019    Calcium 9.6 02/23/2019     No results found for: TSH     ............................................................................................................................................Marland Kitchen          Assessment & Recommendations: Brenda Beard is a 53 y.o. female with history of ileocolonic Crohn's disease with perineal involvement s/p ileocolonic resection in 2000. She was previously treated with infliximab which was stopped due to insurance issues and has been maintained on ustekinumab since 2014. Her last endoscopy in 2018 confirmed that her Crohn's was in remission. She was seen last month for perirectal abscess which resolved with antibiotic therapy  and sitz baths. Today she wanted to keep her visit as she was recently found to be COVID positive. She seems to be doing fairly well from that perspective and is not having any respiratory issues but is fatigued. She just took her ustekinumab on 03/16/19 so there should not be any need for adjusting timing of her ustekinumab due to her COVID diagnosis. We did review SECURE IBD data today as well. She has had slight increase in BM frequency which could be due to COVID itself as we know this can cause diarrhea. She will monitor her symptoms over the next few days to week and if diarrhea does not resolve, we will set her up for a colonoscopy to reassess Crohn's disease activity at least 21 days after her positive COVID test.     We also discussed the importance of prevention today including the need for annual influenza vaccine. She will get this through PCP and also will talk with PCP about Pneumovax vaccine. She reports that she did get Prevnar through PCP last year. Given her age and immunosuppression, Shingrix vaccine series would also be recommended. She will get these vaccines after she is done quarantining due to COVID diagnosis.    PLAN:  1. Crohn's disease  - continue Stelara every 8 weeks  - monitor BM frequency as recovers from COVID, she will contact me if no improvement and will order colonoscopy for further evaluation     2. Perineal disease  - new Rx for ciprofloxacin sent to pharmacy for pt to have on hand   - contact me if worsening symptoms and will refer to GI Surgery for EUA 3. High risk medication monitoring (Stelara)  - Quant gold negative 02/23/19, HBV serologies without evidence of immunity 02/23/19  - discussed need for at least annual monitoring labs, up to date     4. COVID+  - monitor symptoms  - as above if no improvement in diarrhea, will arrange colonoscopy     5. Prevention  - recommend annual influenza vaccine, she will get locally  - received Prevnar 2019 with PCP per pt, recommend Pneumovax  - recommend Shingrix vaccine series   - recommend HBV vaccine series given non-immune 02/23/19   - pap: h/o hysterectomy, need to discuss at next visit whether still has cervix in place  - vitamin D level 38.9 on 02/23/19   - CRC surveillance - due in 2023 or sooner if worsening symptoms; anticipate that we will be performing this sooner due to her symptoms     6. Follow up in 3 months or sooner as needed  --------------------------------------------  Mardee Postin, MD  [facility] Gastroenterology & Hepatology  Multidisciplinary Inflammatory Bowel Disease Clinic    I spent 20 minutes on the real time audio and video visit with the patient. I spent an additional 10 minutes on pre- and post-visit activities.     The patient was not located and I was located within 250 yards of a hospital based location during the audio and video visit. The patient was physically located in [place] or a state in which I am permitted to provide care. The patient and/or parent/guardian understood that s/he may incur co-pays and cost sharing, and agreed to the telemedicine visit. The visit was reasonable and appropriate under the circumstances given the patient's presentation at the time. The patient and/or parent/guardian has been advised of the potential risks and limitations of this mode of treatment (including, but not limited to, the absence of in-person examination)  and has agreed to be treated using telemedicine. The patient's/patient's family's questions regarding telemedicine have been answered.    If the visit was completed in an ambulatory setting, the patient and/or parent/guardian has also been advised to contact their provider???s office for worsening conditions, and seek emergency medical treatment and/or call 911 if the patient deems either necessary.

## 2019-03-30 NOTE — Unmapped (Signed)
INSTRUCTIONS:     1.  Please keep me updated on your COVID symptoms   2.  Let me know if diarrhea does not improve - if not, we can get you scheduled for colonoscopy 21 days after your COVID diagnosis   3.  Definitely would recommend flu and Shingrix vaccine after you recover from COVID  4.  Refill of ciprofloxacin sent into your pharmacy, let me know if you have to restart for any issues in the bottom area   5.  If any trouble or symptoms, do not hesitate to call.     Mardee Postin, MD  Division of Gastroenterology & Hepatology  [place] [facility] [place]    Clinical Nurse Contact:  Neta Mends, RN  Ph#  [phone]  Fax#  [phone]    For clinic appointments, please call, [phone], option 1.  For questions regarding radiology appointments, or to schedule, [phone].  For questions regarding scheduling GI procedures (e.g,. Colonoscopy), please call, [phone], option 2.    For educational material and resources:  [email or web address]  ============================================

## 2019-04-14 DIAGNOSIS — K611 Rectal abscess: Principal | ICD-10-CM

## 2019-04-14 MED ORDER — METRONIDAZOLE 500 MG TABLET
ORAL_TABLET | Freq: Two times a day (BID) | ORAL | 0 refills | 0 days | Status: CP
Start: 2019-04-14 — End: 2019-04-28

## 2019-04-15 DIAGNOSIS — K50919 Crohn's disease, unspecified, with unspecified complications: Principal | ICD-10-CM

## 2019-04-15 DIAGNOSIS — K50114 Crohn's disease of large intestine with abscess: Principal | ICD-10-CM

## 2019-04-15 NOTE — Unmapped (Signed)
Spoke with patient today regarding ongoing perianal symptoms, she states she is still having rectal pain and discharge, she is no longer having the piercing spasm that she was experiencing last night, started flagyl yesterday and thinks she is slightly better, but still would like further evaluation. She is concerned about going into the weekend with these symptoms. She does not feel any knots which she has experienced in the past. C/o lower abdominal pain as well.   MRI pelvis w/ w/o constart ordered per dr weaver - called radiology to schedule however first available was in a week.   Called [employer] to obtain authorization however Crohns disease was not an indication per represenative on the phone and further clinical review would be needed, asked this be marked urgent. Form faxed back with clinc note today 04/15/2019 at 11:38 to [employer] [facility] [employer] radiology auth provider (ph. [phone])    Pt had positive covid19 on 03/26/2019 so can been seen in the hospital for MRI as early as 04/16/2019. Pt would prefer NOT to be at main hospital.

## 2019-04-16 ENCOUNTER — Ambulatory Visit: Admit: 2019-04-16 | Discharge: 2019-04-17 | Payer: MEDICARE

## 2019-04-16 DIAGNOSIS — K5090 Crohn's disease, unspecified, without complications: Secondary | ICD-10-CM | POA: Diagnosis not present

## 2019-04-16 DIAGNOSIS — K50114 Crohn's disease of large intestine with abscess: Secondary | ICD-10-CM | POA: Diagnosis not present

## 2019-04-16 DIAGNOSIS — K611 Rectal abscess: Secondary | ICD-10-CM | POA: Diagnosis not present

## 2019-04-16 DIAGNOSIS — K50919 Crohn's disease, unspecified, with unspecified complications: Principal | ICD-10-CM

## 2019-04-16 MED ADMIN — gadobenate dimeglumine (MULTIHANCE) 529 mg/mL (0.1mmol/0.2mL) solution 7 mL: 7 mL | INTRAVENOUS | @ 20:00:00 | Stop: 2019-04-16

## 2019-04-17 NOTE — Unmapped (Signed)
2 nights ago in severe pain was on verge of going to hospital, flagyl helped a lot   Called Mrs. Chenoweth to review results of MRI Pelvis that she had done today - this shows ~2 cm perirectal abscess. She had been complaining of worsening perirectal pressure over the last few days, was on cipro so added flagyl and was having difficulty having BMs due to pain. She says that the flagyl has helped a lot and feels like able to have BMs - has had 5 so far today. She still does have some pressure and pain in her rectum but denies any fevers, chills, or abdominal pain. I encouraged her to go to the ED for evaluation by GI Surgery team since it is Friday evening but she would like to have this taken care of in the outpatient setting if possible. She requests to be seen by Dr. Epifania Gore as he previously saw her in the early 2000s and she feels most comfortable with him taking care of her. I will reach out to Dr. Epifania Gore and Marylu Lund to see if able to coordinate EUA for sometime next week. She understands that she must come to the ED if any worsening symptoms or if develops fevers, chills, etc over the weekend. She voiced understanding of plan.    11 minutes spent on phone with patient this evening.     Mardee Postin, MD  Assistant Professor of Medicine  Division of Gastroenterology & Hepatology  [place] [facility] [place] [facility] [place]    IBD Nurse Coordinator: Neta Mends RN, BSN  Nurse line (patient care messages): [phone]  Fax: [phone]

## 2019-04-19 DIAGNOSIS — K50014 Crohn's disease of small intestine with abscess: Principal | ICD-10-CM

## 2019-04-19 DIAGNOSIS — K611 Rectal abscess: Principal | ICD-10-CM

## 2019-04-19 MED ORDER — METRONIDAZOLE 500 MG TABLET
ORAL_TABLET | Freq: Two times a day (BID) | ORAL | 0 refills | 14 days | Status: CP
Start: 2019-04-19 — End: ?

## 2019-04-19 MED ORDER — CIPROFLOXACIN 500 MG TABLET
ORAL_TABLET | Freq: Two times a day (BID) | ORAL | 0 refills | 14.00000 days | Status: CP
Start: 2019-04-19 — End: ?

## 2019-04-19 NOTE — Unmapped (Signed)
Received staff message to schedule clinic appt. Per MD, schedule EUA too. Clinic appt 04/23/19, EUA 04/26/2019. She does not need COVID testing since she tested positive on 03/26/19. She does not have any covid symptoms. She is concerned that she will develop RVF in the meantime and become septic. I explained to her that that is very rare and she is already on abx. I explained to her that we will need to see her in clinic to examine that area. She is ok with scheduled dates.

## 2019-04-19 NOTE — Unmapped (Signed)
Spoke to pt today, she is very concerned about waiting until Friday to see Dr. Epifania Gore regarding perianal abscess found on MRI and discussed with Dr Alben Spittle on Friday - she spoke with Marylu Lund RN this morning and voiced her concerns who informed her she'd reach out to Dr. Epifania Gore about a potentially sooner appointment. Markel plans to call Marylu Lund this afternoon if she doen't hear from her.    She is upset that no one is as concerned as me, although ED was presented to her as an option for her if she feels she needs more urgent care, she prefers this be done as an outpt with Dr Epifania Gore who she is familiar with.     She has 8 days left of flagyl, and today is her last day of cipro. She does feel the antibiotics have helped her discomfort. Will update Dr. Alben Spittle

## 2019-04-23 ENCOUNTER — Ambulatory Visit: Admit: 2019-04-23 | Discharge: 2019-04-24 | Payer: MEDICARE | Attending: Surgery | Primary: Surgery

## 2019-04-23 ENCOUNTER — Ambulatory Visit: Admit: 2019-04-23 | Discharge: 2019-04-24 | Payer: MEDICARE

## 2019-04-23 DIAGNOSIS — K611 Rectal abscess: Secondary | ICD-10-CM | POA: Diagnosis not present

## 2019-04-23 DIAGNOSIS — K50919 Crohn's disease, unspecified, with unspecified complications: Secondary | ICD-10-CM | POA: Diagnosis not present

## 2019-04-23 MED ORDER — CIPROFLOXACIN 500 MG TABLET
ORAL_TABLET | Freq: Two times a day (BID) | ORAL | 0 refills | 90 days | Status: CP
Start: 2019-04-23 — End: 2019-07-22

## 2019-04-23 NOTE — Unmapped (Signed)
Patient Name: Brenda Beard  Patient Age: 53 y.o.  Encounter Date: 04/23/2019    REFERRING PHYSICIAN:  Thurmon Fair, MD  [address]  [place],  [place] [address]    CONSULTING PHYSICIANS:  Patient Care Team:  Jules Husbands, MD as PCP - General  Lossie Faes, RN as Registered Nurse  [name], MD (Gastroenterology)    PRIMARY CARE PROVIDER:  Kinnie Feil, MD    REASON FOR VISIT:  Perirectal abscess    HISTORY OF PRESENT ILLNESS:    Brenda Beard is a 53 y.o. female who is seen in consultation at the request of Thurmon Fair* for perirectal abscess.    This is a 53 y.o. female with a history of Crohn's with perianal disease (on Stelara) s/p ileocecectomy with small bowel resection (2000) who presented to clinic today with pelvic pressure and pain for 3 months. Patient reported that prior to September, she had been doing very well at home. She has been on Stelara for years and has had no GI issues. In September, however, she began noticing a persistent pressure in her lower abdominal area which she described as her pelvis which has been very distressing. The pressure feels similar to previously when she had fistulas, but she reported that these previous fistulas would form and drain, whereas during her current episode, the pressure has not evolved into a fistula. She has had 3 courses of ciprofloxacin and 1 course of metronidazole. Her symptoms would improve with antibiotics, and then would come back after she finishes her course. She has also had steroid suppository, but she was not sure if that treatment helped with her symptoms since she was on antibiotics at the same time. She denied any recent fevers, chills, abdominal pain. She is otherwise having regular, soft, formed bowel movements, but since starting metronidazole, she has had dizziness and diarrhea. Did notice one episode of green-yellow discharge from her vagina, which self-resolved. An MRI obtained on 04/16/19 demosntrated a 2.1 x 1.9 x 1.1 cm perirectal abscess extending almost circumferentially around the distal rectum with surrounding inflammatory changes, a fistulous tract along the left side of the abscess extending through the intersphincteric space which may communicate with small fistulous tract extending to the left gluteal soft tissues, and possible additional small fistulous tract through the right intersphincteric space.    Her last colonoscopy was in 2018, which showed patent end-to-end ileo-colonic anastomosis with healthy appearing mucosa.    REVIEW OF SYSTEMS: Denies fevers, chills, sweats, nausea, vomiting, abdominal pain, chest pain, shortness of breath, weight loss, change in bowel habits, melena or hematochezia. Remainder of a 10 system review of systems is negative.     ALLERGIES:  is allergic to mercaptopurine; metoprolol; morphine; and penicillins.    MEDICATIONS:  Current Outpatient Medications   Medication Sig Dispense Refill   ??? ALPRAZolam (XANAX) 0.5 MG tablet Take 1 tablet (0.5 mg total) by mouth Three (3) times a day as needed for sleep. 90 tablet 2   ??? baloxavir marboxiL (XOFLUZA) 40 mg Tab Take by mouth once. Took 1 tablet once     ??? calcium carbonate-vitamin D2 500 mg(1,250mg ) -200 unit tablet Take 1 tablet by mouth nightly.     ??? cholecalciferol, vitamin D3, 125 mcg (5,000 unit) tablet Take 5,000 Units by mouth daily.     ??? ciprofloxacin HCl (CIPRO) 500 MG tablet Take 1 tablet (500 mg total) by mouth every twelve (12) hours. (Patient not taking: Reported on 04/21/2019) 14  each 1   ??? ciprofloxacin HCl (CIPRO) 500 MG tablet Take 1 tablet (500 mg total) by mouth every twelve (12) hours. 28 tablet 0   ??? cyanocobalamin, vitamin B-12, (VITAMIN B-12) 5,000 mcg Subl Place 1 tablet under the tongue nightly.     ??? estradiol (ESTRACE) 1 MG tablet Take 1 mg by mouth nightly.      ??? FUROSEMIDE ORAL Take 10 mg by mouth daily as needed. ??? gabapentin (NEURONTIN) 300 MG capsule Take 600 mg by mouth nightly.      ??? glucosamine-chondroitin 500-400 mg tablet Take 2 tablets by mouth nightly.      ??? hydrocortisone (ANUSOL-HC) 25 mg suppository Insert 1 suppository (25 mg total) into the rectum nightly. (Patient not taking: Reported on 04/21/2019) 12 each 1   ??? metroNIDAZOLE (FLAGYL) 500 MG tablet Take 1 tablet (500 mg total) by mouth two (2) times a day for 14 days. 28 tablet 0   ??? metroNIDAZOLE (FLAGYL) 500 MG tablet Take 1 tablet (500 mg total) by mouth two (2) times a day. 28 tablet 0   ??? NYSTATIN ORAL Take by mouth.     ??? omega-3 fatty acids-fish oil (FISH OIL) 300-1,000 mg cap Take 1 capsule by mouth daily. Last taken 05/26/15     ??? phentermine 37.5 MG capsule Take 37.5 mg by mouth.     ??? rosuvastatin (CRESTOR) 10 MG tablet Take 10 mg by mouth 3 (three) times a week.      ??? UNABLE TO FIND Take 1,000 mg by mouth two (2) times a day. Med Name: tumeric     ??? UNABLE TO FIND Take 1 capsule by mouth two (2) times a day. Med Name: cherry extract     ??? ustekinumab (STELARA) 90 mg/mL Syrg syringe INJECT THE CONTENTS OF 1 SYRINGE (90MG ) UNDER THE SKIN EVERY 8 WEEKS 1 mL 5     No current facility-administered medications for this visit.        MEDICAL HISTORY:  Past Medical History:   Diagnosis Date   ??? Constipation    ??? Crohn's disease (CMS-HCC)    ??? DDD (degenerative disc disease), lumbar    ??? Diabetes mellitus (CMS-HCC)     Borderline, doesn't take meds and doesn't check sugars.  Diet managed.   ??? H/O hypotension     States occurred post hysterectomy and colon surgery.   ??? Heart murmur    ??? High cholesterol    ??? Hypertension    ??? Incomplete defecation     intermittent   ??? Joint pain    ??? Rectocele    ??? Straining during bowel movements        SURGICAL HISTORY:  Past Surgical History:   Procedure Laterality Date   ??? ANAL FISSURECTOMY  2007    [place]   ??? APPENDECTOMY      Childhood   ??? BACK SURGERY     ??? COLON SURGERY  1992 and 1999 small intestine-total 20 inches removed   ??? EYE SURGERY  2017    cataract surgery    ??? HEMORRHOID SURGERY  1992   ??? HYSTERECTOMY      Complete   ??? PR ANAL PRESSURE RECORD N/A 05/01/2018    Procedure: ANORECTAL MANOMETRY;  Surgeon: Nurse-Based Giproc;  [facility];  Service: Gastroenterology   ??? PR COLONOSCOPY W/BIOPSY SINGLE/MULTIPLE  03/17/2013    Procedure: COLONOSCOPY, FLEXIBLE, PROXIMAL TO SPLENIC FLEXURE; WITH BIOPSY, SINGLE OR MULTIPLE;  Surgeon: Orlan Leavens  Erma Heritage, MD;  Location: [facility];  Service: Gastroenterology   ??? PR COLONOSCOPY W/BIOPSY SINGLE/MULTIPLE N/A 03/13/2017    Procedure: COLONOSCOPY, FLEXIBLE, PROXIMAL TO SPLENIC FLEXURE; WITH BIOPSY, SINGLE OR MULTIPLE;  Surgeon: [name], MD;  [facility];  Service: Gastroenterology   ??? PR LAMINEC/FACETECT/FORAMIN,LUMBAR 1 SEG Bilateral 05/31/2015    Procedure: L4-5 HEMILAMINECTOMY, DISCECTOMY;  Surgeon: Malachy Chamber, MD;  Location: OR [facility];  Service: Orthopedics   ??? TONSILLECTOMY         SOCIAL HISTORY:   Tobacco use:  reports that she quit smoking about 18 years ago. She has a 12.00 pack-year smoking history. She has never used smokeless tobacco.  Alcohol use:  reports current alcohol use, drinks socially  Drug use:  reports no history of drug use.    FAMILY HISTORY:  family history includes Atrial fibrillation in her father and mother; Diabetes in her father and mother; Heart disease in her mother; Hypertension in her mother.        Objective :    Vital Signs for this encounter:  BSA: 1.89 meters squared  BP 137/68  - Pulse 65  - Temp 36.6 ??C  - Wt 77.1 kg (170 lb)  - BMI 27.44 kg/m??     General Appearance:  No acute distress, well appearing and well nourished.   HEENT:   CV:  Pulm: Normocephalic, atraumatic, anicteric sclera  Regular rate  NWOB on room air   Abdomen:  Rectal: Soft, nontender, nondistended Pinpoint opening appreciated on the right in the anterolateral position, no erythema or drainage appreciated, non tender to palpation. Otherwise normal perianal exam. Tender to palpation on DRE circumferentially, more in the anterior aspect.   Musculoskeletal: Extremities without clubbing, cyanosis, or edema.   Skin: Skin color, texture, turgor normal, no rashes or lesions.   Neurologic: No motor abnormalities noted.  Sensation grossly intact.             DIAGNOSTIC STUDIES:      MRI 04/16/19  IMPRESSION:  Imaging sequela of pelvic floor laxity/descent, which somewhat limits evaluation due to crowding of structures in the deep pelvis.  ??  2.1 x 1.9 x 1.1 cm perirectal abscess which extends almost circumferentially around the distal rectum as described above with surrounding inflammatory changes.   ??  Along the left side of the abscess is a fistulous tract that extends through the intersphincteric space which may be in continuity with an adjacent small fistulous tract extending to the left gluteal soft tissues. Possible additional small fistulous tract through the right intersphincteric space as well.  ??  Prominent vaginal wall thickening is likely reactive. Questionable rectovaginal fistula, recommend clinical correlation and consider further characterization with fluoroscopic study.    Colonoscopy 03/13/17  Impression:        - Patent end-to-end ileo-colonic anastomosis,                      characterized by healthy appearing mucosa.                     - Diverticulosis in the sigmoid colon and in the                      descending colon.                     - Nodule in the descending colon. Biopsied.                     -  The splenic flexure and transverse colon are normal.      ASSESSMENT: Brenda Beard is a 53 y.o. female with a history of Crohn's with perianal disease (on Stelara) s/p ileocecectomy with small bowel resection (2000) who presented to clinic today with perirectal abscess. Based on exam, she does not appear to have any perianal involvement, and based on imaging, her abscess does not appear amenable to VIR drainage. Since she has improved with antibiotics, we suspect that she will need a longer course for treatment and subsequently on a suppressive basis.    PLAN:    - Will defer EUA for now  - Continue cipro, indefinitely if patient can tolerate (3 month supply sent to patient's pharmacy)  - Half metronidazole to 250mg  BID given side effects of dizziness, continue indefinitely if patient can tolerate (3 month supply sent to patient's pharmacy)  - If symptoms do not improve, patient should call us back and follow up in GI surgery clinic with repeat imaging  - Follow up with GI medicine as scheduled  04/27/2019    I saw and evaluated the patient, participating in the all the portions of the service.  I reviewed the resident's note.  I agree with the resident's findings and plan.     Rae Halsted MD

## 2019-04-27 DIAGNOSIS — K50919 Crohn's disease, unspecified, with unspecified complications: Principal | ICD-10-CM

## 2019-04-27 MED ORDER — STELARA 90 MG/ML SUBCUTANEOUS SYRINGE
2 refills | 0 days | Status: CP
Start: 2019-04-27 — End: 2020-04-26
  Filled 2019-05-03: qty 1, 56d supply, fill #0

## 2019-04-27 NOTE — Unmapped (Signed)
[facility] Clinical Assessment & Refill Coordination Note    Brenda Beard, DOB: 1965/11/19  Phone: [phone] (home)     All above HIPAA information was verified with patient.     Was a Nurse, learning disability used for this call? No    Specialty Medication(s):   Inflammatory Disorders: Stelara     Current Outpatient Medications   Medication Sig Dispense Refill   ??? ALPRAZolam (XANAX) 0.5 MG tablet Take 1 tablet (0.5 mg total) by mouth Three (3) times a day as needed for sleep. 90 tablet 2   ??? baloxavir marboxiL (XOFLUZA) 40 mg Tab Take by mouth once. Took 1 tablet once     ??? calcium carbonate-vitamin D2 500 mg(1,250mg ) -200 unit tablet Take 1 tablet by mouth nightly.     ??? cholecalciferol, vitamin D3, 125 mcg (5,000 unit) tablet Take 5,000 Units by mouth daily.     ??? ciprofloxacin HCl (CIPRO) 500 MG tablet Take 1 tablet (500 mg total) by mouth every twelve (12) hours. 14 each 1   ??? ciprofloxacin HCl (CIPRO) 500 MG tablet Take 1 tablet (500 mg total) by mouth every twelve (12) hours. 28 tablet 0   ??? ciprofloxacin HCl (CIPRO) 500 MG tablet Take 1 tablet (500 mg total) by mouth Two (2) times a day. 180 tablet 0   ??? cyanocobalamin, vitamin B-12, (VITAMIN B-12) 5,000 mcg Subl Place 1 tablet under the tongue nightly.     ??? estradiol (ESTRACE) 1 MG tablet Take 1 mg by mouth nightly.      ??? FUROSEMIDE ORAL Take 10 mg by mouth daily as needed.     ??? gabapentin (NEURONTIN) 300 MG capsule Take 600 mg by mouth nightly.      ??? glucosamine-chondroitin 500-400 mg tablet Take 2 tablets by mouth nightly.      ??? hydrocortisone (ANUSOL-HC) 25 mg suppository Insert 1 suppository (25 mg total) into the rectum nightly. 12 each 1   ??? [START ON 04/29/2019] metroNIDAZOLE (FLAGYL) 250 MG tablet Take 1 tablet (250 mg total) by mouth Two (2) times a day. 180 tablet 0   ??? metroNIDAZOLE (FLAGYL) 500 MG tablet Take 1 tablet (500 mg total) by mouth two (2) times a day for 14 days. 28 tablet 0 ??? metroNIDAZOLE (FLAGYL) 500 MG tablet Take 1 tablet (500 mg total) by mouth two (2) times a day. 28 tablet 0   ??? NYSTATIN ORAL Take by mouth.     ??? omega-3 fatty acids-fish oil (FISH OIL) 300-1,000 mg cap Take 1 capsule by mouth daily. Last taken 05/26/15     ??? phentermine 37.5 MG capsule Take 37.5 mg by mouth.     ??? rosuvastatin (CRESTOR) 10 MG tablet Take 10 mg by mouth 3 (three) times a week.      ??? UNABLE TO FIND Take 1,000 mg by mouth two (2) times a day. Med Name: tumeric     ??? UNABLE TO FIND Take 1 capsule by mouth two (2) times a day. Med Name: cherry extract     ??? ustekinumab (STELARA) 90 mg/mL Syrg syringe INJECT THE CONTENTS OF 1 SYRINGE (90MG ) UNDER THE SKIN EVERY 8 WEEKS 1 mL 5     No current facility-administered medications for this visit.         Changes to medications: [name] reports no changes at this time.    Allergies   Allergen Reactions   ??? Mercaptopurine Other (See Comments)     Other reaction(s): Pancreatitis.  Used to  treat pt. Crohn's Disease.  Caused Pancreatis 1992.       Other reaction(s): Other (See Comments)  Used to treat pt. Crohn's Disease.  Caused Pancreatis 1992.   ??? Metoprolol Other (See Comments)     Extreme fatigue   ??? Morphine Itching     States can take short term.   ??? Penicillins Rash             Changes to allergies: No    SPECIALTY MEDICATION ADHERENCE     Stelara 90 mg/ml: 0 days of medicine on hand     Medication Adherence    Patient reported X missed doses in the last month: 0  Specialty Medication: Stelara 90mg /ml  Patient is on additional specialty medications: No  Informant: patient          Specialty medication(s) dose(s) confirmed: Regimen is correct and unchanged.     Are there any concerns with adherence? No    Adherence counseling provided? Not needed    CLINICAL MANAGEMENT AND INTERVENTION      Clinical Benefit Assessment:    Do you feel the medicine is effective or helping your condition? Yes    Clinical Benefit counseling provided? Not needed Adverse Effects Assessment:    Are you experiencing any side effects? No    Are you experiencing difficulty administering your medicine? No    Quality of Life Assessment:    How many days over the past month did your Crohn's keep you from your normal activities? For example, brushing your teeth or getting up in the morning. Patient declined to answer    Have you discussed this with your provider? Not needed    Therapy Appropriateness:    Is therapy appropriate? Yes, therapy is appropriate and should be continued    DISEASE/MEDICATION-SPECIFIC INFORMATION      For patients on injectable medications: Patient currently has 0 doses left.  Next injection is scheduled for 12/28.    PATIENT SPECIFIC NEEDS     ? Does the patient have any physical, cognitive, or cultural barriers? No    ? Is the patient high risk? No     ? Does the patient require a Care Management Plan? No     ? Does the patient require physician intervention or other additional services (i.e. nutrition, smoking cessation, social work)? No      SHIPPING     Specialty Medication(s) to be Shipped:   Inflammatory Disorders: Stelara    Other medication(s) to be shipped: n/a     Changes to insurance: No    Delivery Scheduled: Yes, Expected medication delivery date: 12/21.  However, Rx request for refills was sent to the provider as there are none remaining.     Medication will be delivered via UPS to the confirmed prescription address in [facility].    The patient will receive a drug information handout for each medication shipped and additional FDA Medication Guides as required.  Verified that patient has previously received a Conservation officer, historic buildings.    All of the patient's questions and concerns have been addressed.    Julianne Rice   [facility] Specialty Pharmacist

## 2019-04-27 NOTE — Unmapped (Signed)
Stelara refills authorized, [facility] appt up to date 03/2019.

## 2019-04-28 MED ORDER — ALPRAZOLAM 0.5 MG TABLET
ORAL_TABLET | Freq: Three times a day (TID) | ORAL | 0 refills | 30 days | Status: CP | PRN
Start: 2019-04-28 — End: ?

## 2019-04-28 NOTE — Unmapped (Signed)
Rx for alprazolam sent to pharmacy. Patient understands this is one time prescription to allow her time to discuss need for future refills of chronic benzodiazepine with PCP. See MyChart for details.    Mardee Postin, MD  Assistant Professor of Medicine  Division of Gastroenterology & Hepatology  [facility]    IBD Nurse Coordinator: Neta Mends RN, BSN  Nurse line (patient care messages): [phone]  Fax: [phone]

## 2019-04-29 MED ORDER — METRONIDAZOLE 250 MG TABLET
ORAL_TABLET | Freq: Two times a day (BID) | ORAL | 0 refills | 90.00000 days | Status: CP
Start: 2019-04-29 — End: 2019-07-28

## 2019-05-03 MED FILL — STELARA 90 MG/ML SUBCUTANEOUS SYRINGE: 56 days supply | Qty: 1 | Fill #0 | Status: AC

## 2019-05-19 DIAGNOSIS — Z01419 Encounter for gynecological examination (general) (routine) without abnormal findings: Secondary | ICD-10-CM | POA: Diagnosis not present

## 2019-06-03 DIAGNOSIS — Z0000 Encounter for general adult medical examination without abnormal findings: Secondary | ICD-10-CM | POA: Diagnosis not present

## 2019-06-03 DIAGNOSIS — Z23 Encounter for immunization: Secondary | ICD-10-CM | POA: Diagnosis not present

## 2019-06-24 MED FILL — STELARA 90 MG/ML SUBCUTANEOUS SYRINGE: 56 days supply | Qty: 1 | Fill #1 | Status: AC

## 2019-06-24 MED FILL — STELARA 90 MG/ML SUBCUTANEOUS SYRINGE: 56 days supply | Qty: 1 | Fill #1

## 2019-06-24 NOTE — Unmapped (Signed)
[facility] Refill Coordination Note    Specialty Medication(s) to be Shipped:   Inflammatory Disorders: Stelara    Other medication(s) to be shipped: N/A     Brenda Beard, DOB: 28-Nov-1965  Phone: [phone] (home)       All above HIPAA information was verified with patient.     Was a Nurse, learning disability used for this call? No    Completed refill call assessment today to schedule patient's medication shipment from the [facility] [phone]).       Specialty medication(s) and dose(s) confirmed: Regimen is correct and unchanged.   Changes to medications: Brenda Beard reports no changes at this time.  Changes to insurance: No  Questions for the pharmacist: No    Confirmed patient received Welcome Packet with first shipment. The patient will receive a drug information handout for each medication shipped and additional FDA Medication Guides as required.       DISEASE/MEDICATION-SPECIFIC INFORMATION        N/A    SPECIALTY MEDICATION ADHERENCE     Medication Adherence    Patient reported X missed doses in the last month: 0  Specialty Medication: Stelara 90mg /12ml  Patient is on additional specialty medications: No  Informant: patient  Reliability of informant: reliable                Stelara 90 mg/34ml: 0 days of medicine on hand       SHIPPING     Shipping address confirmed in Epic.     Delivery Scheduled: Yes, Expected medication delivery date: 06/25/19.     Medication will be delivered via UPS to the prescription address in [facility].    Brenda Beard [facility] Specialty Technician

## 2019-07-05 ENCOUNTER — Encounter: Payer: Self-pay | Admitting: Family Medicine

## 2019-07-05 DIAGNOSIS — E782 Mixed hyperlipidemia: Secondary | ICD-10-CM | POA: Insufficient documentation

## 2019-07-05 DIAGNOSIS — I10 Essential (primary) hypertension: Secondary | ICD-10-CM | POA: Insufficient documentation

## 2019-07-06 ENCOUNTER — Encounter: Payer: Self-pay | Admitting: Family Medicine

## 2019-07-06 ENCOUNTER — Other Ambulatory Visit: Payer: Self-pay

## 2019-07-06 ENCOUNTER — Ambulatory Visit (INDEPENDENT_AMBULATORY_CARE_PROVIDER_SITE_OTHER): Payer: Medicare HMO | Admitting: Family Medicine

## 2019-07-06 VITALS — BP 110/74 | HR 68 | Temp 97.2°F | Resp 16 | Ht 65.0 in | Wt 161.4 lb

## 2019-07-06 DIAGNOSIS — K50818 Crohn's disease of both small and large intestine with other complication: Secondary | ICD-10-CM | POA: Diagnosis not present

## 2019-07-06 DIAGNOSIS — R202 Paresthesia of skin: Secondary | ICD-10-CM

## 2019-07-06 DIAGNOSIS — R002 Palpitations: Secondary | ICD-10-CM

## 2019-07-06 DIAGNOSIS — E785 Hyperlipidemia, unspecified: Secondary | ICD-10-CM

## 2019-07-06 DIAGNOSIS — Z6828 Body mass index (BMI) 28.0-28.9, adult: Secondary | ICD-10-CM | POA: Insufficient documentation

## 2019-07-06 DIAGNOSIS — E1169 Type 2 diabetes mellitus with other specified complication: Secondary | ICD-10-CM

## 2019-07-06 DIAGNOSIS — R001 Bradycardia, unspecified: Secondary | ICD-10-CM

## 2019-07-06 DIAGNOSIS — I10 Essential (primary) hypertension: Secondary | ICD-10-CM

## 2019-07-06 DIAGNOSIS — Z6826 Body mass index (BMI) 26.0-26.9, adult: Secondary | ICD-10-CM

## 2019-07-06 DIAGNOSIS — E782 Mixed hyperlipidemia: Secondary | ICD-10-CM | POA: Diagnosis not present

## 2019-07-06 DIAGNOSIS — Z6829 Body mass index (BMI) 29.0-29.9, adult: Secondary | ICD-10-CM | POA: Insufficient documentation

## 2019-07-06 HISTORY — DX: Paresthesia of skin: R20.2

## 2019-07-06 HISTORY — DX: Type 2 diabetes mellitus with other specified complication: E11.69

## 2019-07-06 HISTORY — DX: Body mass index (BMI) 26.0-26.9, adult: Z68.26

## 2019-07-06 HISTORY — DX: Palpitations: R00.2

## 2019-07-06 LAB — POCT UA - MICROALBUMIN: Microalbumin Ur, POC: 80 mg/L

## 2019-07-06 NOTE — Progress Notes (Signed)
Subjective:  Patient ID: Brittany [name], female    DOB: 01-23-1966  Age: 54 y.o. MRN: [id number]  Chief Complaint  Patient presents with  . Hyperlipidemia  . Hypertension  . Diabetes  . Crohn's Disease    HPI Patient is a 54 year old white female with diabetes, hyperlipidemia, hypertension and Crohn's disease who presents for routine follow-up.  She eats very healthy and has worked very hard to lose a significant amount of weight over the last 2 years.  She does have some continued symptoms following Covid infection in the fall.  These include fatigue, intermittent palpitations, and feeling off balance.  She denies chest pain or shortness of breath.  She denies falls.  In addition she did have a perirectal abscess in the fall after Covid which she was treated for with Flagyl and Cipro.  They were unable to do an I&D per the patient.  In terms of her diabetes it has significantly improved and she is not checking her sugars as it is unneeded.  She does check her feet every day.  She is due for an eye exam and this is scheduled.   Social History   Socioeconomic History  . Marital status: Widowed    Spouse name: Not on file  . Number of children: 1  . Years of education: Not on file  . Highest education level: Not on file  Occupational History  . Not on file  Tobacco Use  . Smoking status: Former Smoker    Quit date: 2002    Years since quitting: 19.1  . Smokeless tobacco: Never Used  Substance and Sexual Activity  . Alcohol use: No  . Drug use: No  . Sexual activity: Not on file  Other Topics Concern  . Not on file  Social History Narrative   wears sunscreen, brushes and flosses daily, see's dentist bi-annually, has smoke/carbon monoxide detectors, wears a seatbelt and practices gun safety   Social Determinants of Health   Financial Resource Strain:   . Difficulty of Paying Living Expenses: Not on file  Food Insecurity:   . Worried About Charity fundraiser in the Last Year:  Not on file  . Ran Out of Food in the Last Year: Not on file  Transportation Needs:   . Lack of Transportation (Medical): Not on file  . Lack of Transportation (Non-Medical): Not on file  Physical Activity:   . Days of Exercise per Week: Not on file  . Minutes of Exercise per Session: Not on file  Stress:   . Feeling of Stress : Not on file  Social Connections:   . Frequency of Communication with Friends and Family: Not on file  . Frequency of Social Gatherings with Friends and Family: Not on file  . Attends Religious Services: Not on file  . Active Member of Clubs or Organizations: Not on file  . Attends Archivist Meetings: Not on file  . Marital Status: Not on file   Past Medical History:  Diagnosis Date  . Bradycardia 08/10/2014  . Crohn's disease ([place])   . Diabetes mellitus without complication ([place])   . Hyperlipidemia   . Hypertension    Family History  Problem Relation Age of Onset  . Heart Problems Mother   . Diabetes Mother   . Diabetes Father   . Heart Problems Father   . Cancer Maternal Grandmother        lung  . Peptic Ulcer Disease Other     Review of  Systems  Constitutional: Positive for fatigue. Negative for chills and fever.  HENT: Negative for congestion, ear pain and sore throat.   Respiratory: Negative for cough and shortness of breath.   Cardiovascular: Positive for palpitations. Negative for chest pain.  Gastrointestinal: Negative for abdominal pain, constipation, diarrhea, nausea and vomiting.  Endocrine: Negative for polydipsia, polyphagia and polyuria.  Genitourinary: Negative for dysuria and urgency.  Musculoskeletal: Positive for arthralgias and back pain. Negative for myalgias.       Lumbar. Radicular symptoms at times down her left leg (mainly on the bottom of her left foot . Hip pops.  Neurological: Positive for dizziness and numbness. Negative for headaches.       Left foot.   Psychiatric/Behavioral: Positive for dysphoric mood.  Negative for suicidal ideas. The patient is not nervous/anxious.        Insomnia - melatonin is helping.      Objective:  BP 110/74 (BP Location: Left Arm, Patient Position: Sitting, Cuff Size: Large)   Pulse 68   Temp (!) 97.2 F (36.2 C)   Resp 16   Ht 5\' 5"  (1.651 m)   Wt 161 lb 6.4 oz (73.2 kg)   LMP 09/23/2012   BMI 26.86 kg/m   BP/Weight 07/06/2019 08/10/2014 [id number]  Systolic BP [id number]  Diastolic BP 74 80 69  Wt. (Lbs) 161.4 237.8 -  BMI 26.86 38.96 -    Physical Exam Vitals reviewed.  Constitutional:      General: She is not in acute distress.    Appearance: Normal appearance. She is normal weight.  Eyes:     Conjunctiva/sclera: Conjunctivae normal.  Neck:     Thyroid: No thyroid mass.     Vascular: No carotid bruit.  Cardiovascular:     Rate and Rhythm: Normal rate and regular rhythm.     Heart sounds: No murmur.  Pulmonary:     Effort: Pulmonary effort is normal.     Breath sounds: Normal breath sounds.     Comments: Heart rate low. Distant S1 and S2. No murmur.  Abdominal:     General: Bowel sounds are normal.     Palpations: Abdomen is soft. There is no mass.     Tenderness: There is no abdominal tenderness.  Musculoskeletal:        General: Normal range of motion.     Comments: Negative SLR BL.  Skin:    General: Skin is warm and dry.  Neurological:     Mental Status: She is alert and oriented to person, place, and time.     Cranial Nerves: No cranial nerve deficit.  Psychiatric:        Mood and Affect: Mood normal.        Behavior: Behavior normal.    Diabetic Foot Exam - Simple   Simple Foot Form Visual Inspection No deformities, no ulcerations, no other skin breakdown bilaterally: Yes Sensation Testing See comments: Yes Pulse Check Posterior Tibialis and Dorsalis pulse intact bilaterally: Yes Comments Numbness of left plantar foot.      Lab Results  Component Value Date   WBC 7.2 10/21/2012   HGB 10.6 (L) 10/21/2012    HCT 34.1 (L) 10/21/2012   PLT 169 10/21/2012   GLUCOSE 99 10/20/2012   NA 136 10/20/2012   K 3.7 10/20/2012   CL 98 10/20/2012   CREATININE 0.73 10/20/2012   BUN 9 10/20/2012   CO2 27 10/20/2012   INR 0.86 10/20/2012   MICROALBUR 80 07/06/2019  EKG - SINUS BRADYCARDIA. HR 45.  Assessment & Plan:   Hypertension, essential Hypertension is well controlled.  She is currently on no blood pressure medications due to her impressive weight loss over the last 2 years.  Crohn's disease ([place]) Well-controlled on Stelara.  Followed by gastroenterology.  Hyperlipidemia associated with type 2 diabetes mellitus ([place]) Well-controlled with fish oil, low-fat and low sugar diet and exercise.  Check A1c.  Bradycardia Check labs to include CBC, CMP, TSH. This is not medication induced as patient is on no calcium channel blockers or beta blockers. Refer to cardiology immediately. Sinus bradycardia. Need consult for a pacemaker.   Follow-up: Return in about 3 months (around 10/03/2019).  AVS was given to patient prior to departure.  [name] [facility] [phone]

## 2019-07-06 NOTE — Assessment & Plan Note (Signed)
Well-controlled on Stelara.  Followed by gastroenterology.

## 2019-07-06 NOTE — Patient Instructions (Signed)
No changes to medicines.  Labs drawn.

## 2019-07-06 NOTE — Assessment & Plan Note (Signed)
Hypertension is well controlled.  She is currently on no blood pressure medications due to her impressive weight loss over the last 2 years.

## 2019-07-06 NOTE — Assessment & Plan Note (Signed)
Check labs to include CBC, CMP, TSH. This is not medication induced as patient is on no calcium channel blockers or beta blockers. Refer to cardiology immediately. Sinus bradycardia. Need consult for a pacemaker.

## 2019-07-06 NOTE — Assessment & Plan Note (Addendum)
Well-controlled with fish oil, low-fat and low sugar diet and exercise.  Check A1c.

## 2019-07-07 LAB — CBC WITH DIFFERENTIAL/PLATELET
Basophils Absolute: 0.1 10*3/uL (ref 0.0–0.2)
Basos: 1 %
EOS (ABSOLUTE): 0.1 10*3/uL (ref 0.0–0.4)
Eos: 1 %
Hematocrit: 43.3 % (ref 34.0–46.6)
Hemoglobin: 14.2 g/dL (ref 11.1–15.9)
Immature Grans (Abs): 0 10*3/uL (ref 0.0–0.1)
Immature Granulocytes: 0 %
Lymphocytes Absolute: 1.9 10*3/uL (ref 0.7–3.1)
Lymphs: 33 %
MCH: 30.1 pg (ref 26.6–33.0)
MCHC: 32.8 g/dL (ref 31.5–35.7)
MCV: 92 fL (ref 79–97)
Monocytes Absolute: 0.4 10*3/uL (ref 0.1–0.9)
Monocytes: 8 %
Neutrophils Absolute: 3.3 10*3/uL (ref 1.4–7.0)
Neutrophils: 57 %
Platelets: 212 10*3/uL (ref 150–450)
RBC: 4.72 x10E6/uL (ref 3.77–5.28)
RDW: 13.5 % (ref 11.7–15.4)
WBC: 5.8 10*3/uL (ref 3.4–10.8)

## 2019-07-07 LAB — COMP. METABOLIC PANEL (12)
AST: 12 IU/L (ref 0–40)
Albumin/Globulin Ratio: 1.8 (ref 1.2–2.2)
Albumin: 4.4 g/dL (ref 3.8–4.9)
Alkaline Phosphatase: 66 IU/L (ref 39–117)
BUN/Creatinine Ratio: 23 (ref 9–23)
BUN: 16 mg/dL (ref 6–24)
Bilirubin Total: 0.5 mg/dL (ref 0.0–1.2)
Calcium: 9.7 mg/dL (ref 8.7–10.2)
Chloride: 103 mmol/L (ref 96–106)
Creatinine, Ser: 0.71 mg/dL (ref 0.57–1.00)
GFR calc Af Amer: 111 mL/min/{1.73_m2} (ref >59)
GFR calc non Af Amer: 96 mL/min/{1.73_m2} (ref >59)
Globulin, Total: 2.4 g/dL (ref 1.5–4.5)
Glucose: 87 mg/dL (ref 65–99)
Potassium: 4.6 mmol/L (ref 3.5–5.2)
Sodium: 142 mmol/L (ref 134–144)
Total Protein: 6.8 g/dL (ref 6.0–8.5)

## 2019-07-07 LAB — LIPID PANEL
Chol/HDL Ratio: 3.1 ratio (ref 0.0–4.4)
Cholesterol, Total: 228 mg/dL — ABNORMAL HIGH (ref 100–199)
HDL: 74 mg/dL (ref >39)
LDL Chol Calc (NIH): 132 mg/dL — ABNORMAL HIGH (ref 0–99)
Triglycerides: 126 mg/dL (ref 0–149)
VLDL Cholesterol Cal: 22 mg/dL (ref 5–40)

## 2019-07-07 LAB — B12 AND FOLATE PANEL
Folate: 13.1 ng/mL (ref >3.0)
Vitamin B-12: >2000 pg/mL — ABNORMAL HIGH (ref 232–1245)

## 2019-07-07 LAB — CARDIOVASCULAR RISK ASSESSMENT

## 2019-07-07 LAB — TSH: TSH: 1.52 u[IU]/mL (ref 0.450–4.500)

## 2019-07-07 LAB — HEMOGLOBIN A1C
Est. average glucose Bld gHb Est-mCnc: 111 mg/dL
Hgb A1c MFr Bld: 5.5 % (ref 4.8–5.6)

## 2019-07-13 DIAGNOSIS — L821 Other seborrheic keratosis: Secondary | ICD-10-CM | POA: Diagnosis not present

## 2019-07-13 DIAGNOSIS — L578 Other skin changes due to chronic exposure to nonionizing radiation: Secondary | ICD-10-CM | POA: Diagnosis not present

## 2019-07-13 DIAGNOSIS — B079 Viral wart, unspecified: Secondary | ICD-10-CM | POA: Diagnosis not present

## 2019-07-13 DIAGNOSIS — L570 Actinic keratosis: Secondary | ICD-10-CM | POA: Diagnosis not present

## 2019-07-15 ENCOUNTER — Encounter: Payer: Self-pay | Admitting: Cardiology

## 2019-07-15 ENCOUNTER — Ambulatory Visit: Payer: Medicare HMO | Admitting: Cardiology

## 2019-07-15 ENCOUNTER — Other Ambulatory Visit: Payer: Self-pay

## 2019-07-15 ENCOUNTER — Telehealth: Payer: Self-pay | Admitting: Radiology

## 2019-07-15 VITALS — BP 150/68 | HR 66 | Ht 65.0 in | Wt 163.2 lb

## 2019-07-15 DIAGNOSIS — R002 Palpitations: Secondary | ICD-10-CM | POA: Diagnosis not present

## 2019-07-15 DIAGNOSIS — R001 Bradycardia, unspecified: Secondary | ICD-10-CM | POA: Diagnosis not present

## 2019-07-15 NOTE — Patient Instructions (Signed)
Medication Instructions:  Your physician recommends that you continue on your current medications as directed. Please refer to the Current Medication list given to you today.  *If you need a refill on your cardiac medications before your next appointment, please call your pharmacy*   Lab Work: None ordered   Testing/Procedures: Your physician has recommended that you wear a 2 week holter monitor. Holter monitors are medical devices that record the heart's electrical activity. Doctors most often use these monitors to diagnose arrhythmias. Arrhythmias are problems with the speed or rhythm of the heartbeat. The monitor is a small, portable device. You can wear one while you do your normal daily activities. This is usually used to diagnose what is causing palpitations/syncope (passing out).   Follow-Up: At [facility], you and your health needs are our priority.  As part of our continuing mission to provide you with exceptional heart care, we have created designated Provider Care Teams.  These Care Teams include your primary Cardiologist (physician) and Advanced Practice Providers (APPs -  Physician Assistants and Nurse Practitioners) who all work together to provide you with the care you need, when you need it.   Your next appointment:    To be determined after monitor has been completed and reviewed by MD  The format for your next appointment:   In Person  Provider:   Allegra Lai, MD   Thank you for choosing [facility]!!   Trinidad Curet, RN [phone]    Other Instructions  ZIO XT- Long Term Monitor Instructions   Your physician has requested you wear your ZIO patch monitor_______days.   This is a single patch monitor.  [employer] supplies one patch monitor per enrollment.  Additional stickers are not available.   Please do not apply patch if you will be having a Nuclear Stress Test, Echocardiogram, Cardiac CT, MRI, or Chest Xray during the time frame you would be  wearing the monitor. The patch cannot be worn during these tests.  You cannot remove and re-apply the ZIO XT patch monitor.   Your ZIO patch monitor will be sent USPS Priority mail from [facility] directly to your home address. The monitor may also be mailed to a PO BOX if home delivery is not available.   It may take 3-5 days to receive your monitor after you have been enrolled.   Once you have received you monitor, please review enclosed instructions.  Your monitor has already been registered assigning a specific monitor serial # to you.   Applying the monitor   Shave hair from upper left chest.   Hold abrader disc by orange tab.  Rub abrader in 40 strokes over left upper chest as indicated in your monitor instructions.   Clean area with 4 enclosed alcohol pads .  Use all pads to assure are is cleaned thoroughly.  Let dry.   Apply patch as indicated in monitor instructions.  Patch will be place under collarbone on left side of chest with arrow pointing upward.   Rub patch adhesive wings for 2 minutes.Remove white label marked "1".  Remove white label marked "2".  Rub patch adhesive wings for 2 additional minutes.   While looking in a mirror, press and release button in center of patch.  A small green light will flash 3-4 times .  This will be your only indicator the monitor has been turned on.     Do not shower for the first 24 hours.  You may shower after the first 24  hours.   Press button if you feel a symptom. You will hear a small click.  Record Date, Time and Symptom in the Patient Log Book.   When you are ready to remove patch, follow instructions on last 2 pages of Patient Log Book.  Stick patch monitor onto last page of Patient Log Book.   Place Patient Log Book in [place] box.  Use locking tab on box and tape box closed securely.  The [employer] and [employer] has [employer] on it.  Please place in mailbox as soon as possible.  Your physician should have your test results  approximately 7 days after the monitor has been mailed back to [facility].   Call [place] [facility] at [phone] if you have questions regarding your ZIO XT patch monitor.  Call them immediately if you see an orange light blinking on your monitor.   If your monitor falls off in less than 4 days contact our Monitor department at [phone].  If your monitor becomes loose or falls off after 4 days call [employer] at [phone] for suggestions on securing your monitor.      Ambulatory Cardiac Monitoring An ambulatory cardiac monitor is a small recording device that is used to detect abnormal heart rhythms (arrhythmias). Most monitors are connected by wires to flat, sticky disks (electrodes) that are then attached to your chest. You may need to wear a monitor if you have had symptoms such as:  Fast heartbeats (palpitations).  Dizziness.  Fainting or light-headedness.  Unexplained weakness.  Shortness of breath. There are several types of monitors. Some common monitors include:  Holter monitor. This records your heart rhythm continuously, usually for 24-48 hours.  Event (episodic) monitor. This monitor has a symptoms button, and when pushed, it will begin recording. You need to activate this monitor to record when you have a heart-related symptom.  Automatic detection monitor. This monitor will begin recording when it detects an abnormal heartbeat. What are the risks? Generally, these devices are safe to use. However, it is possible that the skin under the electrodes will become irritated. How to prepare for monitoring Your health care provider will prepare your chest for the electrode placement and show you how to use the monitor.  Do not apply lotions to your chest before monitoring.  Follow directions on how to care for the monitor, and how to return the monitor when the testing period is complete. How to use your cardiac monitor  Follow directions about how  long to wear the monitor, and if you can take the monitor off in order to shower or bathe. ? Do not let the monitor get wet. ? Do not bathe, swim, or use a hot tub while wearing the monitor.  Keep your skin clean. Do not put body lotion or moisturizer on your chest.  Change the electrodes as told by your health care provider, or any time they stop sticking to your skin. You may need to use medical tape to keep them on.  Try to put the electrodes in slightly different places on your chest to help prevent skin irritation. Follow directions from your health care provider about where to place the electrodes.  Make sure the monitor is safely clipped to your clothing or in a location close to your body as recommended by your health care provider.  If your monitor has a symptoms button, press the button to mark an event as soon as you feel a heart-related symptom, such as: ? Dizziness. ?  Weakness. ? Light-headedness. ? Palpitations. ? Thumping or pounding in your chest. ? Shortness of breath. ? Unexplained weakness.  Keep a diary of your activities, such as walking, doing chores, and taking medicine. It is very important to note what you were doing when you pushed the button to record your symptoms. This will help your health care provider determine what might be contributing to your symptoms.  Send the recorded information as recommended by your health care provider. It may take some time for your health care provider to process the results.  Change the batteries as told by your health care provider.  Keep electronic devices away from your monitor. These include: ? Tablets. ? MP3 players. ? Cell phones.  While wearing your monitor you should avoid: ? Electric blankets. ? Armed forces operational officer. ? Electric toothbrushes. ? Microwave ovens. ? Magnets. ? Metal detectors. Get help right away if:  You have chest pain.  You have shortness of breath or extreme difficulty breathing.  You  develop a very fast heartbeat that does not get better.  You develop dizziness that does not go away.  You faint or constantly feel like you are about to faint. Summary  An ambulatory cardiac monitor is a small recording device that is used to detect abnormal heart rhythms (arrhythmias).  Make sure you understand how to send the information from the monitor to your health care provider.  It is important to press the button on the monitor when you have any heart-related symptoms.  Keep a diary of your activities, such as walking, doing chores, and taking medicine. It is very important to note what you were doing when you pushed the button to record your symptoms. This will help your health care provider learn what might be causing your symptoms. This information is not intended to replace advice given to you by your health care provider. Make sure you discuss any questions you have with your health care provider. Document Revised: 04/11/2017 Document Reviewed: 04/13/2016 Elsevier Patient Education  2020 [employer].

## 2019-07-15 NOTE — Telephone Encounter (Signed)
Enrolled patient for a 14 day Zio monitor to be mailed to patients home.  

## 2019-07-15 NOTE — Progress Notes (Signed)
Electrophysiology Office Note   Date:  07/15/2019   ID:  Brittany [name], DOB 1965-07-06, MRN [id number]  PCP:  [name], MD  Cardiologist:   Primary Electrophysiologist:  Woodroe Vogan Meredith Leeds, MD    Chief Complaint: bradycardia   History of Present Illness: Brittany [name] is a 54 y.o. female who is being seen today for the evaluation of bradycardia at the request of [name], Brittany Maxwell, MD. Presenting today for electrophysiology evaluation.  She has a history significant for diabetes, hyperlipidemia, hypertension, Crohn's disease.  She has been eating quite healthy and has lost a significant amount of weight over the last 2 years.  She did develop a Covid infection this past fall.  Since her Covid infection, she has had short-lived palpitations that occur a few times a week.  She has some weakness and fatigue as well.  She states that she was fatigued when she saw her primary physician with heart rates at 45.  She comes in today with the same amount of fatigue, though her heart rate is 66.  She otherwise has felt well without major complaint.  Today, she denies symptoms of palpitations, chest pain, shortness of breath, orthopnea, PND, lower extremity edema, claudication, dizziness, presyncope, syncope, bleeding, or neurologic sequela. The patient is tolerating medications without difficulties.    Past Medical History:  Diagnosis Date  . BMI 26.0-26.9,adult 07/06/2019  . Bradycardia 08/10/2014  . Crohn's disease ([place])   . DDD (degenerative disc disease), lumbar 07/17/2017  . Diabetes mellitus without complication ([place])   . Fatigue 08/10/2014  . Hyperlipidemia   . Hyperlipidemia associated with type 2 diabetes mellitus ([place]) 07/06/2019  . Hypertension   . Hypertension, essential   . Mixed hyperlipidemia   . Palpitations 07/06/2019  . Paresthesia 07/06/2019   Past Surgical History:  Procedure Laterality Date  . ABDOMINAL HYSTERECTOMY N/A 10/20/2012   Procedure: HYSTERECTOMY ABDOMINAL;   Surgeon: [name], MD;  Location: [facility];  Service: Gynecology;  Laterality: N/A;  . ANAL FISSURE REPAIR  2002  . APPENDECTOMY    . SALPINGOOPHORECTOMY Bilateral 10/20/2012   Procedure: SALPINGO OOPHORECTOMY;  Surgeon: [name], MD;  Location: [facility];  Service: Gynecology;  Laterality: Bilateral;  . SMALL INTESTINE SURGERY     reconstruction  . TONSILLECTOMY       Current Outpatient Medications  Medication Sig Dispense Refill  . ALPRAZolam (XANAX) 0.5 MG tablet Take 0.5 mg by mouth 2 (two) times daily.    . calcium-vitamin D (OSCAL WITH D) 500-200 MG-UNIT per tablet Take 1 tablet by mouth daily.    Marland Kitchen estradiol (ESTRACE) 1 MG tablet Take 1 mg by mouth daily.    . fish oil-omega-3 fatty acids 1000 MG capsule Take 1 g by mouth daily.    Marland Kitchen gabapentin (NEURONTIN) 300 MG capsule Take 600 mg by mouth at bedtime.    . Glucosamine-Chondroit-Vit C-Mn (GLUCOSAMINE 1500 COMPLEX PO) Glucosamine    . Melatonin 10 MG CAPS Take by mouth.    . ustekinumab (STELARA) 90 MG/ML SOSY injection Inject the contents of 1 syringe (90mg ) under the skin every 8 weeks.     No current facility-administered medications for this visit.    Allergies:   Mercaptopurine, Amitriptyline, Metoprolol, Trazodone and nefazodone, Zocor [simvastatin], Morphine and related, and Penicillins   Social History:  The patient  reports that she quit smoking about 19 years ago. She has never used smokeless tobacco. She reports that she does not drink alcohol or use drugs.   Family History:  The patient's family history includes Cancer in her maternal grandmother; Diabetes in her father and mother; Heart Problems in her father and mother; Peptic Ulcer Disease in an other family member.    ROS:  Please see the history of present illness.   Otherwise, review of systems is positive for none.   All other systems are reviewed and negative.    PHYSICAL EXAM: VS:  BP (!) 150/68   Pulse 66   Ht 5\' 5"  (1.651 m)   Wt 163 lb 3.2 oz (74  kg)   LMP 09/23/2012   SpO2 99%   BMI 27.16 kg/m  , BMI Body mass index is 27.16 kg/m. GEN: Well nourished, well developed, in no acute distress  HEENT: normal  Neck: no JVD, carotid bruits, or masses Cardiac: RRR; no murmurs, rubs, or gallops,no edema  Respiratory:  clear to auscultation bilaterally, normal work of breathing GI: soft, nontender, nondistended, + BS MS: no deformity or atrophy  Skin: warm and dry Neuro:  Strength and sensation are intact Psych: euthymic mood, full affect  EKG:  EKG is not ordered today. Personal review of the ekg ordered 07/06/19 shows this rhythm, rate 45  Recent Labs: 07/06/2019: BUN 16; Creatinine, Ser 0.71; Hemoglobin 14.2; Platelets 212; Potassium 4.6; Sodium 142; TSH 1.520    Lipid Panel     Component Value Date/Time   CHOL 228 (H) 07/06/2019 0910   TRIG 126 07/06/2019 0910   HDL 74 07/06/2019 0910   CHOLHDL 3.1 07/06/2019 0910   LDLCALC 132 (H) 07/06/2019 0910     Wt Readings from Last 3 Encounters:  07/15/19 163 lb 3.2 oz (74 kg)  07/06/19 161 lb 6.4 oz (73.2 kg)  08/10/14 237 lb 12.8 oz (107.9 kg)      Other studies Reviewed: Additional studies/ records that were reviewed today include: TTE 09/13/14  Review of the above records today demonstrates:  - Left ventricle: The cavity size was normal. Wall thickness was  normal. Systolic function was normal. The estimated ejection  fraction was in the range of 60% to 65%. Wall motion was normal;  there were no regional wall motion abnormalities. Left  ventricular diastolic function parameters were normal.  - Left atrium: The atrium was normal in size.  - Tricuspid valve: There was mild regurgitation.  - Pulmonary arteries: PA peak pressure: 32 mm Hg (S).  - Inferior vena cava: The vessel was normal in size. The  respirophasic diameter changes were in the normal range (= 50%),  consistent with normal central venous pressure.    ASSESSMENT AND PLAN:  1.  Palpitations  with sinus bradycardia: She has had palpitations and Covid diagnosis in the fall.  She is also had some sinus bradycardia.  This was noted on ECG by her primary physician.  Fortunately, her PR interval is normal and she does not have a bundle branch block or a fascicular block.  I do not feel that she is in danger due to her bradycardia.  She says that she feels fatigued today with a heart rate of 66.  Her heart rate was 45 with the same amount of fatigue at her primary physicians.  To further evaluate her rhythm, Kharson Rasmusson place a 2-week ZIO monitor.  This was discussed with her primary physician.  2.  Hyperlipidemia: Currently controlled with diet and exercise.  3.  Hypertension: Has lost weight and is now not on hypertensive medications.  Congratulated on weight loss.  Current medicines are reviewed at length with the patient  today.   The patient does not have concerns regarding her medicines.  The following changes were made today:  none  Labs/ tests ordered today include:  Orders Placed This Encounter  Procedures  . LONG TERM MONITOR (3-14 DAYS)     Disposition:   FU with Dimitrious Micciche Pending monitor   Signed, Giabella Duhart Meredith Leeds, MD  07/15/2019 10:19 AM     [facility] [address] [place] [address] [phone] (office) [phone] (fax)

## 2019-07-21 ENCOUNTER — Other Ambulatory Visit (INDEPENDENT_AMBULATORY_CARE_PROVIDER_SITE_OTHER): Payer: Medicare HMO

## 2019-07-21 DIAGNOSIS — R001 Bradycardia, unspecified: Secondary | ICD-10-CM

## 2019-07-21 DIAGNOSIS — R002 Palpitations: Secondary | ICD-10-CM

## 2019-07-22 NOTE — Unmapped (Signed)
Patient does not need a refill of specialty medication at this time. Moving specialty refill reminder call to appropriate date and removed call attempt data.  Spoke with patient and she states she does not need any refills on the Stelara at this time due to her last injection was 06/30/2019 and her next injection for Stelara is due on 08/25/2019.

## 2019-08-10 DIAGNOSIS — R001 Bradycardia, unspecified: Secondary | ICD-10-CM | POA: Diagnosis not present

## 2019-08-10 DIAGNOSIS — R002 Palpitations: Secondary | ICD-10-CM | POA: Diagnosis not present

## 2019-08-11 NOTE — Unmapped (Signed)
[facility] Clinical Assessment & Refill Coordination Note    Brenda Beard, DOB: 1965/06/26  Phone: [phone] (home)     All above HIPAA information was verified with patient.     Was a Nurse, learning disability used for this call? No    Specialty Medication(s):   Inflammatory Disorders: Stelara     Current Outpatient Medications   Medication Sig Dispense Refill   ??? ALPRAZolam (XANAX) 0.5 MG tablet Take 1 tablet (0.5 mg total) by mouth Three (3) times a day as needed for sleep or anxiety. 90 tablet 0   ??? baloxavir marboxiL (XOFLUZA) 40 mg Tab Take by mouth once. Took 1 tablet once     ??? ciprofloxacin HCl (CIPRO) 500 MG tablet Take 1 tablet (500 mg total) by mouth every twelve (12) hours. (Patient not taking: Reported on 04/27/2019) 14 each 1   ??? ciprofloxacin HCl (CIPRO) 500 MG tablet Take 1 tablet (500 mg total) by mouth every twelve (12) hours. (Patient not taking: Reported on 04/27/2019) 28 tablet 0   ??? cyanocobalamin, vitamin B-12, (VITAMIN B-12) 5,000 mcg Subl Place 1 tablet under the tongue nightly.     ??? estradiol (ESTRACE) 1 MG tablet Take 1 mg by mouth nightly.      ??? FUROSEMIDE ORAL Take 10 mg by mouth daily as needed.     ??? gabapentin (NEURONTIN) 300 MG capsule Take 600 mg by mouth nightly.      ??? glucosamine-chondroitin 500-400 mg tablet Take 2 tablets by mouth nightly.      ??? hydrocortisone (ANUSOL-HC) 25 mg suppository Insert 1 suppository (25 mg total) into the rectum nightly. 12 each 1   ??? metroNIDAZOLE (FLAGYL) 500 MG tablet Take 1 tablet (500 mg total) by mouth two (2) times a day. (Patient not taking: Reported on 04/27/2019) 28 tablet 0   ??? NYSTATIN ORAL Take by mouth.     ??? omega-3 fatty acids-fish oil (FISH OIL) 300-1,000 mg cap Take 1 capsule by mouth daily. Last taken 05/26/15     ??? phentermine 37.5 MG capsule Take 37.5 mg by mouth.     ??? UNABLE TO FIND Take 1,000 mg by mouth two (2) times a day. Med Name: tumeric     ??? UNABLE TO FIND Take 1 capsule by mouth two (2) times a day. Med Name: cherry extract     ??? ustekinumab (STELARA) 90 mg/mL Syrg syringe Inject the contents of 1 syringe (90mg ) under the skin every 8 weeks. 1 mL 2     No current facility-administered medications for this visit.         Changes to medications: Lalanya reports no changes at this time.    Allergies   Allergen Reactions   ??? Mercaptopurine Other (See Comments)     Other reaction(s): Pancreatitis.  Used to treat pt. Crohn's Disease.  Caused Pancreatis 1992.       Other reaction(s): Other (See Comments)  Used to treat pt. Crohn's Disease.  Caused Pancreatis 1992.   ??? Metoprolol Other (See Comments)     Extreme fatigue   ??? Morphine Itching     States can take short term.   ??? Penicillins Rash             Changes to allergies: No    SPECIALTY MEDICATION ADHERENCE     Stelara 90 mg/ml: 0 days of medicine on hand       Medication Adherence    Patient reported X missed doses in the last  month: 0  Specialty Medication: Stelara 90mg /ml  Patient is on additional specialty medications: No  Informant: patient          Specialty medication(s) dose(s) confirmed: Regimen is correct and unchanged.     Are there any concerns with adherence? No    Adherence counseling provided? Not needed    CLINICAL MANAGEMENT AND INTERVENTION      Clinical Benefit Assessment:    Do you feel the medicine is effective or helping your condition? Yes    Clinical Benefit counseling provided? Not needed    Adverse Effects Assessment:    Are you experiencing any side effects? No    Are you experiencing difficulty administering your medicine? No    Quality of Life Assessment:    How many days over the past month did your Stelara  keep you from your normal activities? For example, brushing your teeth or getting up in the morning. 0    Have you discussed this with your provider? Not needed    Therapy Appropriateness:    Is therapy appropriate? Yes, therapy is appropriate and should be continued    DISEASE/MEDICATION-SPECIFIC INFORMATION      For patients on injectable medications: Patient currently has 0 doses left.  Next injection is scheduled for 4/14 but will wait a little bit bc she has covid shot on the same day.    PATIENT SPECIFIC NEEDS     ? Does the patient have any physical, cognitive, or cultural barriers? No    ? Is the patient high risk? No     ? Does the patient require a Care Management Plan? No     ? Does the patient require physician intervention or other additional services (i.e. nutrition, smoking cessation, social work)? No      SHIPPING     Specialty Medication(s) to be Shipped:   Inflammatory Disorders: Stelara    Other medication(s) to be shipped: n/a     Changes to insurance: No    Delivery Scheduled: Yes, Expected medication delivery date: 4/8.     Medication will be delivered via UPS to the confirmed prescription address in [facility].    The patient will receive a drug information handout for each medication shipped and additional FDA Medication Guides as required.  Verified that patient has previously received a Conservation officer, historic buildings.    All of the patient's questions and concerns have been addressed.    Julianne Rice   [facility] Specialty Pharmacist

## 2019-08-13 DIAGNOSIS — Z961 Presence of intraocular lens: Secondary | ICD-10-CM | POA: Diagnosis not present

## 2019-08-13 DIAGNOSIS — E119 Type 2 diabetes mellitus without complications: Secondary | ICD-10-CM | POA: Diagnosis not present

## 2019-08-18 MED FILL — STELARA 90 MG/ML SUBCUTANEOUS SYRINGE: 56 days supply | Qty: 1 | Fill #2 | Status: AC

## 2019-08-18 MED FILL — STELARA 90 MG/ML SUBCUTANEOUS SYRINGE: 56 days supply | Qty: 1 | Fill #2

## 2019-08-25 ENCOUNTER — Telehealth: Payer: Self-pay | Admitting: Family Medicine

## 2019-08-25 NOTE — Progress Notes (Signed)
°  Chronic Care Management   Note  08/25/2019 Name: Brittany [name] MRN: [id number] DOB: 1965-07-05  Brittany [name] is a 54 y.o. year old female who is a primary care patient of [name], Kirsten, MD. I reached out to Brittany [name] by phone today in response to a referral sent by Ms. Lenoard Aden PCP, [name], Kirsten, MD.   Ms. Groetsch was given information about Chronic Care Management services today including:  1. CCM service includes personalized support from designated clinical staff supervised by her physician, including individualized plan of care and coordination with other care providers 2. 24/7 contact phone numbers for assistance for urgent and routine care needs. 3. Service will only be billed when office clinical staff spend 20 minutes or more in a month to coordinate care. 4. Only one practitioner may furnish and bill the service in a calendar month. 5. The patient may stop CCM services at any time (effective at the end of the month) by phone call to the office staff.   Patient agreed to services and verbal consent obtained.   Follow up plan:   Earney Hamburg Upstream Scheduler

## 2019-09-01 ENCOUNTER — Other Ambulatory Visit: Payer: Self-pay | Admitting: Family Medicine

## 2019-09-13 NOTE — Chronic Care Management (AMB) (Signed)
Chronic Care Management Pharmacy  Name: Brittany [name]  MRN: [id number] DOB: 12/30/1965  Chief Complaint/ HPI  Brittany [name],  54 y.o. , female presents for their Initial CCM visit with the clinical pharmacist via telephone due to COVID-19 Pandemic.  PCP : Brittany Brome, MD  Their chronic conditions include: HTN, Crohn's disease, HLD, Degenerative Disc Disease.   Office Visits: 07/06/2019 - Bradycardia - referral to cardiology for pacemaker.  06/03/2019 - Medicare physical. Dexa scan 09/19. Eye exam 06/17/2018. Mammogram 06/10/2019.  03/26/2019 - COVID +.   Consult Visit: 07/15/2019 - !4 day Zio monitor sent to patient's home to evaluate palpitations.  Patient has lost weight and has been able to d/c blood pressure meds. Hyperlipidemia controlled with diet and exercise.  05/19/2019 - Dr. Harrington [name] normal Ob/GYN visit.  04/23/2019 - Perirectal absess - treated with 3 months of antibiotics.   Medications: Outpatient Encounter Medications as of 09/15/2019  Medication Sig  . ALPRAZolam (XANAX) 0.5 MG tablet Take 0.5 mg by mouth at bedtime.   . Biotin 1000 MCG tablet Take 1,000 mcg by mouth daily.  . calcium-vitamin D (OSCAL WITH D) 500-200 MG-UNIT per tablet Take 1 tablet by mouth daily.  . cholecalciferol (VITAMIN D3) 25 MCG (1000 UNIT) tablet Take 1,000 Units by mouth daily.  Marland Kitchen estradiol (ESTRACE) 1 MG tablet TAKE 1 TABLET EVERY DAY  . fish oil-omega-3 fatty acids 1000 MG capsule Take 1 g by mouth daily.  Marland Kitchen gabapentin (NEURONTIN) 300 MG capsule Take 600 mg by mouth at bedtime.  . Glucosamine-Chondroit-Vit C-Mn (GLUCOSAMINE 1500 COMPLEX PO) Glucosamine  . Melatonin 10 MG CAPS Take 10 mg by mouth at bedtime.   . Misc Natural Products (TART CHERRY ADVANCED PO) Take by mouth daily.  . TURMERIC PO Take by mouth in the morning and at bedtime.  . ustekinumab (STELARA) 90 MG/ML SOSY injection Inject the contents of 1 syringe (90mg ) under the skin every 8 weeks.   No facility-administered  encounter medications on file as of 09/15/2019.     Current Diagnosis/Assessment:  Goals Addressed   None     Hypertension   BP today is:  <140/90  Office blood pressures are  BP Readings from Last 3 Encounters:  07/15/19 (!) 150/68  07/06/19 110/74  08/10/14 132/80    Patient has failed these meds in the past: lisinopril 5 mg Patient is currently controlled on the following medications: lifestyle changes  Patient checks BP at home never  Patient home BP readings are ranging: not checking  We discussed diet and exercise extensively. Weight loss seems to help improve blood pressure. Being more active and eating less seems to be controlling bp. Did not want to go on additional medication. She loves sweets.She has a bp cuff but has left it at her mom's house for her to use. She can check if needed but controlled for the most part. Patient is staying active and working to maintain weight loss.   Plan  Continue control with diet and exercise   Hyperlipidemia   Lipid Panel     Component Value Date/Time   CHOL 228 (H) 07/06/2019 0910   TRIG 126 07/06/2019 0910   HDL 74 07/06/2019 0910   CHOLHDL 3.1 07/06/2019 0910   LDLCALC 132 (H) 07/06/2019 0910   LABVLDL 22 07/06/2019 0910     The 10-year ASCVD risk score Brittany [name] DC Jr., et al., 2013) is: 5%   Values used to calculate the score:     Age: 35 years  Sex: Female     Is Non-Hispanic African American: No     Diabetic: Yes     Tobacco smoker: No     Systolic Blood Pressure: [id number] mmHg     Is BP treated: No     HDL Cholesterol: 74 mg/dL     Total Cholesterol: 228 mg/dL   Patient has failed these meds in past: simvastatin 20 mg, rosuvastatin 5 mg  Patient is currently uncontrolled on the following medications: fish oil  We discussed:  diet and exercise extensively. She stopped taking rosuvastatin once weekly dose due to leg pain. She is working on healthy lifestyle to continue to control cholesterol.    Plan  Continue control with diet and exercise  Crohn's Disease   Patient has failed these meds in past: Remicade Patient is currently controlled on the following medications: Stelara  We discussed: Covid back in October caused a slight issues with her Crohn's. Stelara is controlling her symptoms. Patient assistance for Stelara through her Crohn's specialist at [facility]. Marland Kitchen   Plan  Continue current medications  Degenerative Disc Disease   Patient has failed these meds in past: oxycodone-apap, hydrocodone-apap, methocarbamol Patient is currently uncontrolled on the following medications: gabapentin 600 mg qhs  We discussed:  Has had some issues with leg. Her feet are tingling on the bottom. Back looked good at last scan so surgeon didn't feel that sensations were related to back. Thinks she has neuropathy or poor circulation. Bottom of feet tingle and has some pain. She plans to follow-up with surgeon for regular annual appointment and ask to assess feet tingling but will schedule with Dr. Tobie [name] if needed. She has been unable to tolerate tid dosing of gabapentin in the past but thinks that will be a recommendation. She does not like the groggy feeling it yielded in the past.    Plan  Continue current medications   Health Maintenance   Patient is currently controlled on the following medications:  - alprazolam 0.5 mg qhs - sleep - Calcium-Vitamin D 500-200 mg daily - bone health - Estradiol 1 mg daily - s/p hysterectomy in 2013 -Glucosamine-chondroitin-vitamin-c - joints - Melatonin 10 mg - sleep -turmeric - inflammation - tart cherry - inflammation -biotin - hair loss after COVID - vitamin d- general health  We discussed:  Patient has added several medications to combat   Plan  Continue current medications  Vaccines   Reviewed and discussed patient's vaccination history.Second Covid Shot made her take a step back in energy improvement Second COVID shot completed 08/25/2019.      Immunization History  Administered Date(s) Administered  . Influenza-Unspecified 06/03/2019  . Pneumococcal Conjugate-13 11/01/2015  . Pneumococcal Polysaccharide-23 03/15/2013    Plan  Recommended patient receive annual flu vaccine in office.   Medication Management   Pt uses [facility] for all medications Uses pill box? Yes Pt endorses good compliance. Takes pm meds well at night occasionally misses a dose in the am.   We discussed: Patient is going to work to put her box in a place that she will see it. She is only taking vitamins in the morning. All of her prescriptions are at night.    Plan  Continue current medication management strategy   Follow up: 3 month phone visit

## 2019-09-15 ENCOUNTER — Ambulatory Visit: Payer: Medicare HMO

## 2019-09-15 ENCOUNTER — Other Ambulatory Visit: Payer: Self-pay

## 2019-09-15 DIAGNOSIS — I10 Essential (primary) hypertension: Secondary | ICD-10-CM

## 2019-09-15 DIAGNOSIS — E782 Mixed hyperlipidemia: Secondary | ICD-10-CM

## 2019-09-15 NOTE — Patient Instructions (Addendum)
Visit Information  Thank you for your time discussing your medications. I look forward to working with you to achieve your health care goals. Below is a summary of what we talked about during our visit.   Goals Addressed            This Visit's Progress   . Pharmacy Care Plan - HLD       CARE PLAN ENTRY (see longitudinal plan of care for additional care plan information)  Current Barriers:  . Uncontrolled hyperlipidemia, complicated by statin intolerance . Current antihyperlipidemic regimen: diet/lifestyle . Previous antihyperlipidemic medications tried simvastatin, rosuvastatin . Most recent lipid panel:     Component Value Date/Time   CHOL 228 (H) 07/06/2019 0910   TRIG 126 07/06/2019 0910   HDL 74 07/06/2019 0910   CHOLHDL 3.1 07/06/2019 0910   LDLCALC 132 (H) 07/06/2019 0910 .   Marland Kitchen ASCVD risk enhancing conditions: age >86, DM, HTN, CKD, CHF, current smoker . 10-year ASCVD risk score: 5%  Pharmacist Clinical Goal(s):  Marland Kitchen Over the next 90 days, patient will work with PharmD and providers towards optimized antihyperlipidemic therapy . Optimize diet and exercise to improve cholesterol levels.   Interventions: . Comprehensive medication review performed; medication list updated in electronic medical record.  Bertram Savin care team collaboration (see longitudinal plan of care) . Discussed importance of lowering ASCVD risk including risk/benefit of initiating statin therapy and controlling bp  Patient Self Care Activities:  . Patient will focus on lifestyle by not eating so late in the day.  Initial goal documentation     . Pharmacy Care Plan - HTN       CARE PLAN ENTRY (see longitudinal plan of care for additional care plan information)  Current Barriers:  . Chronic disease management support, education and care coordination needs related to hypertension, hyperlipidemia.  . Current antihypertensive regimen: diet/lifestyle . Previous antihypertensives tried:  lisinopril . Last practice recorded BP readings:  BP Readings from Last 3 Encounters:  07/15/19 (!) 150/68  07/06/19 110/74  08/10/14 132/80   . Current home BP readings: not checking . Most recent eGFR/CrCl: No results found for: EGFR  No components found for: CRCL  Pharmacist Clinical Goal(s):  Marland Kitchen Over the next 90 days, patient will work with PharmD and providers to maintain bp less than 140/90 mmHg  Interventions: . Inter-disciplinary care team collaboration (see longitudinal plan of care) . Comprehensive medication review performed; medication list updated in the electronic medical record.  . Discussed benefits of exercise and diet to reduce bp.   Patient Self Care Activities:  . Patient will continue to check BP as needed, document, and provide at future appointments  Initial goal documentation        Ms. Mcglynn was given information about Chronic Care Management services today including:  1. CCM service includes personalized support from designated clinical staff supervised by her physician, including individualized plan of care and coordination with other care providers 2. 24/7 contact phone numbers for assistance for urgent and routine care needs. 3. Standard insurance, coinsurance, copays and deductibles apply for chronic care management only during months in which we provide at least 20 minutes of these services. Most insurances cover these services at 100%, however patients may be responsible for any copay, coinsurance and/or deductible if applicable. This service may help you avoid the need for more expensive face-to-face services. 4. Only one practitioner may furnish and bill the service in a calendar month. 5. The patient may stop CCM services at any  time (effective at the end of the month) by phone call to the office staff.  Patient agreed to services and verbal consent obtained.   The patient verbalized understanding of instructions provided today and agreed to  receive a mailed copy of patient instruction and/or educational materials. Telephone follow up appointment with pharmacy team member scheduled for: 12/2019  Sherre Poot, PharmD Clinical Pharmacist [name] [phone]    DASH Eating Plan DASH stands for "Dietary Approaches to Stop Hypertension." The DASH eating plan is a healthy eating plan that has been shown to reduce high blood pressure (hypertension). It may also reduce your risk for type 2 diabetes, heart disease, and stroke. The DASH eating plan may also help with weight loss. What are tips for following this plan?  General guidelines  Avoid eating more than 2,300 mg (milligrams) of salt (sodium) a day. If you have hypertension, you may need to reduce your sodium intake to 1,500 mg a day.  Limit alcohol intake to no more than 1 drink a day for nonpregnant women and 2 drinks a day for men. One drink equals 12 oz of beer, 5 oz of wine, or 1 oz of hard liquor.  Work with your health care provider to maintain a healthy body weight or to lose weight. Ask what an ideal weight is for you.  Get at least 30 minutes of exercise that causes your heart to beat faster (aerobic exercise) most days of the week. Activities may include walking, swimming, or biking.  Work with your health care provider or diet and nutrition specialist (dietitian) to adjust your eating plan to your individual calorie needs. Reading food labels   Check food labels for the amount of sodium per serving. Choose foods with less than 5 percent of the Daily Value of sodium. Generally, foods with less than 300 mg of sodium per serving fit into this eating plan.  To find whole grains, look for the word "whole" as the first word in the ingredient list. Shopping  Buy products labeled as "low-sodium" or "no salt added."  Buy fresh foods. Avoid canned foods and premade or frozen meals. Cooking  Avoid adding salt when cooking. Use salt-free seasonings or  herbs instead of table salt or sea salt. Check with your health care provider or pharmacist before using salt substitutes.  Do not fry foods. Cook foods using healthy methods such as baking, boiling, grilling, and broiling instead.  Cook with heart-healthy oils, such as olive, canola, soybean, or sunflower oil. Meal planning  Eat a balanced diet that includes: ? 5 or more servings of fruits and vegetables each day. At each meal, try to fill half of your plate with fruits and vegetables. ? Up to 6-8 servings of whole grains each day. ? Less than 6 oz of lean meat, poultry, or fish each day. A 3-oz serving of meat is about the same size as a deck of cards. One egg equals 1 oz. ? 2 servings of low-fat dairy each day. ? A serving of nuts, seeds, or beans 5 times each week. ? Heart-healthy fats. Healthy fats called Omega-3 fatty acids are found in foods such as flaxseeds and coldwater fish, like sardines, salmon, and mackerel.  Limit how much you eat of the following: ? Canned or prepackaged foods. ? Food that is high in trans fat, such as fried foods. ? Food that is high in saturated fat, such as fatty meat. ? Sweets, desserts, sugary drinks, and other foods with added  sugar. ? Full-fat dairy products.  Do not salt foods before eating.  Try to eat at least 2 vegetarian meals each week.  Eat more home-cooked food and less restaurant, buffet, and fast food.  When eating at a restaurant, ask that your food be prepared with less salt or no salt, if possible. What foods are recommended? The items listed may not be a complete list. Talk with your dietitian about what dietary choices are best for you. Grains Whole-grain or whole-wheat bread. Whole-grain or whole-wheat pasta. Arin Peral rice. Modena Morrow. Bulgur. Whole-grain and low-sodium cereals. Pita bread. Low-fat, low-sodium crackers. Whole-wheat flour tortillas. Vegetables Fresh or frozen vegetables (raw, steamed, roasted, or grilled).  Low-sodium or reduced-sodium tomato and vegetable juice. Low-sodium or reduced-sodium tomato sauce and tomato paste. Low-sodium or reduced-sodium canned vegetables. Fruits All fresh, dried, or frozen fruit. Canned fruit in natural juice (without added sugar). Meat and other protein foods Skinless chicken or [place]. Ground chicken or [place]. Pork with fat trimmed off. Fish and seafood. Egg whites. Dried beans, peas, or lentils. Unsalted nuts, nut butters, and seeds. Unsalted canned beans. Lean cuts of beef with fat trimmed off. Low-sodium, lean deli meat. Dairy Low-fat (1%) or fat-free (skim) milk. Fat-free, low-fat, or reduced-fat cheeses. Nonfat, low-sodium ricotta or cottage cheese. Low-fat or nonfat yogurt. Low-fat, low-sodium cheese. Fats and oils Soft margarine without trans fats. Vegetable oil. Low-fat, reduced-fat, or light mayonnaise and salad dressings (reduced-sodium). Canola, safflower, olive, soybean, and sunflower oils. Avocado. Seasoning and other foods Herbs. Spices. Seasoning mixes without salt. Unsalted popcorn and pretzels. Fat-free sweets. What foods are not recommended? The items listed may not be a complete list. Talk with your dietitian about what dietary choices are best for you. Grains Baked goods made with fat, such as croissants, muffins, or some breads. Dry pasta or rice meal packs. Vegetables Creamed or fried vegetables. Vegetables in a cheese sauce. Regular canned vegetables (not low-sodium or reduced-sodium). Regular canned tomato sauce and paste (not low-sodium or reduced-sodium). Regular tomato and vegetable juice (not low-sodium or reduced-sodium). Angie Fava. Olives. Fruits Canned fruit in a light or heavy syrup. Fried fruit. Fruit in cream or butter sauce. Meat and other protein foods Fatty cuts of meat. Ribs. Fried meat. Berniece Salines. Sausage. Bologna and other processed lunch meats. Salami. Fatback. Hotdogs. Bratwurst. Salted nuts and seeds. Canned beans with added  salt. Canned or smoked fish. Whole eggs or egg yolks. Chicken or [place] with skin. Dairy Whole or 2% milk, cream, and half-and-half. Whole or full-fat cream cheese. Whole-fat or sweetened yogurt. Full-fat cheese. Nondairy creamers. Whipped toppings. Processed cheese and cheese spreads. Fats and oils Butter. Stick margarine. Lard. Shortening. Ghee. Bacon fat. Tropical oils, such as coconut, palm kernel, or palm oil. Seasoning and other foods Salted popcorn and pretzels. Onion salt, garlic salt, seasoned salt, table salt, and sea salt. Worcestershire sauce. Tartar sauce. Barbecue sauce. Teriyaki sauce. Soy sauce, including reduced-sodium. Steak sauce. Canned and packaged gravies. Fish sauce. Oyster sauce. Cocktail sauce. Horseradish that you find on the shelf. Ketchup. Mustard. Meat flavorings and tenderizers. Bouillon cubes. Hot sauce and Tabasco sauce. Premade or packaged marinades. Premade or packaged taco seasonings. Relishes. Regular salad dressings. Where to find more information:  National Heart, Lung, and [place]: https://wilson-eaton.com/  American Heart Association: www.heart.org Summary  The DASH eating plan is a healthy eating plan that has been shown to reduce high blood pressure (hypertension). It may also reduce your risk for type 2 diabetes, heart disease, and stroke.  With the DASH eating  plan, you should limit salt (sodium) intake to 2,300 mg a day. If you have hypertension, you may need to reduce your sodium intake to 1,500 mg a day.  When on the DASH eating plan, aim to eat more fresh fruits and vegetables, whole grains, lean proteins, low-fat dairy, and heart-healthy fats.  Work with your health care provider or diet and nutrition specialist (dietitian) to adjust your eating plan to your individual calorie needs. This information is not intended to replace advice given to you by your health care provider. Make sure you discuss any questions you have with your health care  provider. Document Revised: 04/11/2017 Document Reviewed: 04/22/2016 Elsevier Patient Education  2020 [employer].

## 2019-09-18 NOTE — Chronic Care Management (AMB) (Addendum)
Chronic Care Management Pharmacy  Name: Brittany [name]  MRN: [id number] DOB: 29-Aug-1965  Chief Complaint/ HPI  Brittany [name],  54 y.o. , female presents for their Initial CCM visit with the clinical pharmacist via telephone due to COVID-19 Pandemic.  PCP : Brittany Brome, MD  Their chronic conditions include: HTN, Crohn's disease, HLD, Degenerative Disc Disease.   Office Visits: 07/06/2019 - Bradycardia - referral to cardiology for pacemaker.  06/03/2019 - Medicare physical. Dexa scan 09/19. Eye exam 06/17/2018. Mammogram 06/10/2019.  03/26/2019 - COVID +.   Consult Visit: 07/15/2019 - 14 day Zio monitor sent to patient's home to evaluate palpitations.  Patient has lost weight and has been able to d/c blood pressure meds. Hyperlipidemia controlled with diet and exercise.  05/19/2019 - Dr. Harrington [name] normal Ob/GYN visit.  04/23/2019 - Perirectal absess - treated with 3 months of antibiotics.   Medications: Outpatient Encounter Medications as of 09/15/2019  Medication Sig  . ALPRAZolam (XANAX) 0.5 MG tablet Take 0.5 mg by mouth at bedtime.   . Biotin 1000 MCG tablet Take 1,000 mcg by mouth daily.  . calcium-vitamin D (OSCAL WITH D) 500-200 MG-UNIT per tablet Take 1 tablet by mouth daily.  . cholecalciferol (VITAMIN D3) 25 MCG (1000 UNIT) tablet Take 1,000 Units by mouth daily.  Marland Kitchen estradiol (ESTRACE) 1 MG tablet TAKE 1 TABLET EVERY DAY  . fish oil-omega-3 fatty acids 1000 MG capsule Take 1 g by mouth daily.  Marland Kitchen gabapentin (NEURONTIN) 300 MG capsule Take 600 mg by mouth at bedtime.  . Glucosamine-Chondroit-Vit C-Mn (GLUCOSAMINE 1500 COMPLEX PO) Glucosamine  . Melatonin 10 MG CAPS Take 10 mg by mouth at bedtime.   . Misc Natural Products (TART CHERRY ADVANCED PO) Take by mouth daily.  . TURMERIC PO Take by mouth in the morning and at bedtime.  . ustekinumab (STELARA) 90 MG/ML SOSY injection Inject the contents of 1 syringe ('90mg'$ ) under the skin every 8 weeks.   No facility-administered  encounter medications on file as of 09/15/2019.     Current Diagnosis/Assessment:  Goals Addressed            This Visit's Progress   . Pharmacy Care Plan - HLD       CARE PLAN ENTRY (see longitudinal plan of care for additional care plan information)  Current Barriers:  . Uncontrolled hyperlipidemia, complicated by statin intolerance . Current antihyperlipidemic regimen: diet/lifestyle . Previous antihyperlipidemic medications tried simvastatin, rosuvastatin . Most recent lipid panel:     Component Value Date/Time   CHOL 228 (H) 07/06/2019 0910   TRIG 126 07/06/2019 0910   HDL 74 07/06/2019 0910   CHOLHDL 3.1 07/06/2019 0910   LDLCALC 132 (H) 07/06/2019 0910 .   Marland Kitchen ASCVD risk enhancing conditions: age >68, DM, HTN, CKD, CHF, current smoker . 10-year ASCVD risk score: 5%  Pharmacist Clinical Goal(s):  Marland Kitchen Over the next 90 days, patient will work with PharmD and providers towards optimized antihyperlipidemic therapy . Optimize diet and exercise to improve cholesterol levels.   Interventions: . Comprehensive medication review performed; medication list updated in electronic medical record.  Brittany [name] care team collaboration (see longitudinal plan of care) . Discussed importance of lowering ASCVD risk including risk/benefit of initiating statin therapy and controlling bp  Patient Self Care Activities:  . Patient will focus on lifestyle by not eating so late in the day.  Initial goal documentation     . Pharmacy Care Plan - HTN       CARE PLAN ENTRY (  see longitudinal plan of care for additional care plan information)  Current Barriers:  . Chronic disease management support, education and care coordination needs related to hypertension, hyperlipidemia.  . Current antihypertensive regimen: diet/lifestyle . Previous antihypertensives tried: lisinopril . Last practice recorded BP readings:  BP Readings from Last 3 Encounters:  07/15/19 (!) 150/68  07/06/19  110/74  08/10/14 132/80   . Current home BP readings: not checking . Most recent eGFR/CrCl: No results found for: EGFR  No components found for: CRCL  Pharmacist Clinical Goal(s):  Marland Kitchen Over the next 90 days, patient will work with PharmD and providers to maintain bp less than 140/90 mmHg  Interventions: . Inter-disciplinary care team collaboration (see longitudinal plan of care) . Comprehensive medication review performed; medication list updated in the electronic medical record.  . Discussed benefits of exercise and diet to reduce bp.   Patient Self Care Activities:  . Patient will continue to check BP as needed, document, and provide at future appointments  Initial goal documentation        Hypertension   BP today is:  <140/90  Office blood pressures are  BP Readings from Last 3 Encounters:  07/15/19 (!) 150/68  07/06/19 110/74  08/10/14 132/80    Patient has failed these meds in the past: lisinopril 5 mg Patient is currently controlled on the following medications: lifestyle changes  Patient checks BP at home never  Patient home BP readings are ranging: not checking  We discussed diet and exercise extensively. Weight loss seems to help improve blood pressure. Being more active and eating less seems to be controlling bp. Did not want to go on additional medication. She loves sweets.She has a bp cuff but has left it at her mom's house for her to use. She can check if needed but controlled for the most part. Patient is staying active and working to maintain weight loss.   Plan  Continue control with diet and exercise   Hyperlipidemia   Lipid Panel     Component Value Date/Time   CHOL 228 (H) 07/06/2019 0910   TRIG 126 07/06/2019 0910   HDL 74 07/06/2019 0910   CHOLHDL 3.1 07/06/2019 0910   LDLCALC 132 (H) 07/06/2019 0910   LABVLDL 22 07/06/2019 0910     The 10-year ASCVD risk score Brittany [name] DC Jr., et al., 2013) is: 5%   Values used to calculate the score:      Age: 52 years     Sex: Female     Is Non-Hispanic African American: No     Diabetic: Yes     Tobacco smoker: No     Systolic Blood Pressure: 532 mmHg     Is BP treated: No     HDL Cholesterol: 74 mg/dL     Total Cholesterol: 228 mg/dL   Patient has failed these meds in past: simvastatin 20 mg, rosuvastatin 5 mg  Patient is currently uncontrolled on the following medications: fish oil  We discussed:  diet and exercise extensively. She stopped taking rosuvastatin once weekly dose due to leg pain. She is working on healthy lifestyle to continue to control cholesterol.   Plan  Continue control with diet and exercise  Crohn's Disease   Patient has failed these meds in past: Remicade Patient is currently controlled on the following medications: Stelara  We discussed: Covid back in October caused a slight issues with her Crohn's. Stelara is controlling her symptoms. Patient assistance for Stelara through her Crohn's specialist at [facility]. Marland Kitchen  Plan  Continue current medications  Degenerative Disc Disease   Patient has failed these meds in past: oxycodone-apap, hydrocodone-apap, methocarbamol Patient is currently uncontrolled on the following medications: gabapentin 600 mg qhs  We discussed:  Has had some issues with leg. Her feet are tingling on the bottom. Back looked good at last scan so surgeon didn't feel that sensations were related to back. Thinks she has neuropathy or poor circulation. Bottom of feet tingle and has some pain. She plans to follow-up with surgeon for regular annual appointment and ask to assess feet tingling but will schedule with Dr. Tobie Poet if needed. She has been unable to tolerate tid dosing of gabapentin in the past but thinks that will be a recommendation. She does not like the groggy feeling it yielded in the past.    Plan  Continue current medications   Health Maintenance   Patient is currently controlled on the following medications:  - alprazolam 0.5 mg  qhs - sleep - Calcium-Vitamin D 500-200 mg daily - bone health - Estradiol 1 mg daily - s/p hysterectomy in 2013 -Glucosamine-chondroitin-vitamin-c - joints - Melatonin 10 mg - sleep -turmeric - inflammation - tart cherry - inflammation -biotin - hair loss after COVID - vitamin d- general health  We discussed:  Patient has added several medications to combat   Plan  Continue current medications  Vaccines   Reviewed and discussed patient's vaccination history.Second Covid Shot made her take a step back in energy improvement Second COVID shot completed 08/25/2019.     Immunization History  Administered Date(s) Administered  . Influenza-Unspecified 06/03/2019  . Pneumococcal Conjugate-13 11/01/2015  . Pneumococcal Polysaccharide-23 03/15/2013    Plan  Recommended patient receive annual flu vaccine in office.   Medication Management   Pt uses [facility] for all medications Uses pill box? Yes Pt endorses good compliance. Takes pm meds well at night occasionally misses a dose in the am.   We discussed: Patient is going to work to put her box in a place that she will see it. She is only taking vitamins in the morning. All of her prescriptions are at night.    Plan  Continue current medication management strategy   Follow up: 3 month phone visit

## 2019-09-30 ENCOUNTER — Other Ambulatory Visit: Payer: Self-pay

## 2019-09-30 DIAGNOSIS — E1169 Type 2 diabetes mellitus with other specified complication: Secondary | ICD-10-CM

## 2019-09-30 DIAGNOSIS — I10 Essential (primary) hypertension: Secondary | ICD-10-CM

## 2019-09-30 NOTE — Progress Notes (Signed)
Patient had an appt with Sherre Poot, PharmD on 09/15/2019.

## 2019-10-07 DIAGNOSIS — Z1231 Encounter for screening mammogram for malignant neoplasm of breast: Secondary | ICD-10-CM | POA: Diagnosis not present

## 2019-10-07 DIAGNOSIS — K50919 Crohn's disease, unspecified, with unspecified complications: Principal | ICD-10-CM

## 2019-10-07 LAB — HM MAMMOGRAPHY

## 2019-10-07 MED ORDER — STELARA 90 MG/ML SUBCUTANEOUS SYRINGE
0 refills | 0 days | Status: CP
Start: 2019-10-07 — End: 2020-10-06
  Filled 2019-10-13: qty 1, 56d supply, fill #0

## 2019-10-07 NOTE — Unmapped (Signed)
Stelara refill authorized X1. Follow up appt needed with Dr. Alben Spittle, patient will be contacted by [facility] GI central scheduling to schedule

## 2019-10-07 NOTE — Unmapped (Signed)
[facility] Refill Coordination Note    Specialty Medication(s) to be Shipped:   Inflammatory Disorders: Stelara 90mg /ml     Brenda Beard, DOB: June 30, 1965  Phone: [phone] (home)     All above HIPAA information was verified with patient.     Was a Nurse, learning disability used for this call? No    Completed refill call assessment today to schedule patient's medication shipment from the [facility] [phone]).       Specialty medication(s) and dose(s) confirmed: Regimen is correct and unchanged.   Changes to medications: Brenda Beard reports no changes at this time.  Changes to insurance: No  Questions for the pharmacist: No    Confirmed patient received Welcome Packet with first shipment. The patient will receive a drug information handout for each medication shipped and additional FDA Medication Guides as required.       DISEASE/MEDICATION-SPECIFIC INFORMATION        For patients on injectable medications: Patient currently has 0 doses left.  Next injection is scheduled for 10/21/2019.    SPECIALTY MEDICATION ADHERENCE     Medication Adherence    Patient reported X missed doses in the last month: 0  Specialty Medication: Stelara 90mg /ml  Patient is on additional specialty medications: No  Informant: patient  Reliability of informant: reliable        Stelara 90mg /ml: 0 days of medicine on hand     SHIPPING     Shipping address confirmed in Epic.     Delivery Scheduled: Yes, Expected medication delivery date: 10/14/2019.     Medication will be delivered via UPS to the prescription address in Epic WAM.    Brenda Beard Shared [facility] Specialty Technician

## 2019-10-12 DIAGNOSIS — M961 Postlaminectomy syndrome, not elsewhere classified: Secondary | ICD-10-CM | POA: Diagnosis not present

## 2019-10-12 DIAGNOSIS — M4726 Other spondylosis with radiculopathy, lumbar region: Secondary | ICD-10-CM | POA: Diagnosis not present

## 2019-10-12 DIAGNOSIS — M5431 Sciatica, right side: Secondary | ICD-10-CM | POA: Diagnosis not present

## 2019-10-12 DIAGNOSIS — M4326 Fusion of spine, lumbar region: Secondary | ICD-10-CM | POA: Diagnosis not present

## 2019-10-13 MED FILL — STELARA 90 MG/ML SUBCUTANEOUS SYRINGE: 56 days supply | Qty: 1 | Fill #0 | Status: AC

## 2019-10-25 NOTE — Progress Notes (Signed)
Acute Office Visit  Subjective:    Patient ID: Brittany [name], female    DOB: 04-25-1966, 54 y.o.   MRN: [id number]  Chief Complaint  Patient presents with  . leg cramps    HPI Patient is in today complaining of tingling both feet L > R. Cramps in her legs at night. Ambulation does not make it worse.   Patient has diabetes, hyperlipidemia, and crohn's disease. Eating healthy.  She is currently taking fish oil for cholessterol She is on stelara for her crohn's and is well controlled.  Diabetes: Eating healthy and exercising. No medicines. On gabapentin for neuropathy.   Past Medical History:  Diagnosis Date  . BMI 26.0-26.9,adult 07/06/2019  . Bradycardia 08/10/2014  . Crohn's disease ([place])   . DDD (degenerative disc disease), lumbar 07/17/2017  . Diabetes mellitus without complication ([place])   . Fatigue 08/10/2014  . Hyperlipidemia   . Hyperlipidemia associated with type 2 diabetes mellitus ([place]) 07/06/2019  . Hypertension   . Hypertension, essential   . Mixed hyperlipidemia   . Palpitations 07/06/2019  . Paresthesia 07/06/2019    Past Surgical History:  Procedure Laterality Date  . ABDOMINAL HYSTERECTOMY N/A 10/20/2012   Procedure: HYSTERECTOMY ABDOMINAL;  Surgeon: [name], MD;  Location: [facility];  Service: Gynecology;  Laterality: N/A;  . ANAL FISSURE REPAIR  2002  . APPENDECTOMY    . SALPINGOOPHORECTOMY Bilateral 10/20/2012   Procedure: SALPINGO OOPHORECTOMY;  Surgeon: [name], MD;  Location: [facility];  Service: Gynecology;  Laterality: Bilateral;  . SMALL INTESTINE SURGERY     reconstruction  . TONSILLECTOMY      Family History  Problem Relation Age of Onset  . Heart Problems Mother   . Diabetes Mother   . Diabetes Father   . Heart Problems Father   . Cancer Maternal Grandmother        lung  . Peptic Ulcer Disease Other     Social History   Socioeconomic History  . Marital status: Widowed    Spouse name: Not on file  . Number of children: 1  . Years of  education: Not on file  . Highest education level: Not on file  Occupational History  . Not on file  Tobacco Use  . Smoking status: Former Smoker    Quit date: 2002    Years since quitting: 19.4  . Smokeless tobacco: Never Used  Vaping Use  . Vaping Use: Never used  Substance and Sexual Activity  . Alcohol use: No  . Drug use: No  . Sexual activity: Not on file  Other Topics Concern  . Not on file  Social History Narrative   wears sunscreen, brushes and flosses daily, see's dentist bi-annually, has smoke/carbon monoxide detectors, wears a seatbelt and practices gun safety   Social Determinants of Health   Financial Resource Strain:   . Difficulty of Paying Living Expenses:   Food Insecurity:   . Worried About Charity fundraiser in the Last Year:   . Arboriculturist in the Last Year:   Transportation Needs: No Transportation Needs  . Lack of Transportation (Medical): No  . Lack of Transportation (Non-Medical): No  Physical Activity:   . Days of Exercise per Week:   . Minutes of Exercise per Session:   Stress:   . Feeling of Stress :   Social Connections:   . Frequency of Communication with Friends and Family:   . Frequency of Social Gatherings with Friends and Family:   .  Attends Religious Services:   . Active Member of Clubs or Organizations:   . Attends Archivist Meetings:   Marland Kitchen Marital Status:   Intimate Partner Violence:   . Fear of Current or Ex-Partner:   . Emotionally Abused:   Marland Kitchen Physically Abused:   . Sexually Abused:     Outpatient Medications Prior to Visit  Medication Sig Dispense Refill  . ALPRAZolam (XANAX) 0.5 MG tablet Take 0.5 mg by mouth at bedtime.     . Biotin 1000 MCG tablet Take 1,000 mcg by mouth daily.    . calcium-vitamin D (OSCAL WITH D) 500-200 MG-UNIT per tablet Take 1 tablet by mouth daily.    . cholecalciferol (VITAMIN D3) 25 MCG (1000 UNIT) tablet Take 1,000 Units by mouth daily.    Marland Kitchen estradiol (ESTRACE) 1 MG tablet TAKE 1  TABLET EVERY DAY 90 tablet 3  . fish oil-omega-3 fatty acids 1000 MG capsule Take 1 g by mouth daily.    Marland Kitchen gabapentin (NEURONTIN) 300 MG capsule Take 600 mg by mouth at bedtime.    . Glucosamine-Chondroit-Vit C-Mn (GLUCOSAMINE 1500 COMPLEX PO) Glucosamine    . Melatonin 10 MG CAPS Take 10 mg by mouth at bedtime.     . Misc Natural Products (TART CHERRY ADVANCED PO) Take by mouth daily.    . TURMERIC PO Take by mouth in the morning and at bedtime.    . ustekinumab (STELARA) 90 MG/ML SOSY injection Inject the contents of 1 syringe (90mg ) under the skin every 8 weeks.     No facility-administered medications prior to visit.    Allergies  Allergen Reactions  . Mercaptopurine     Used to treat pt. Crohn's Disease.  Caused Pancreatis 1992.  . Amitriptyline   . Metoprolol Other (See Comments)    Extreme fatigue Extreme fatigue   . Trazodone And Nefazodone   . Zocor [Simvastatin]   . Morphine And Related Itching  . Penicillins Rash    Review of Systems  Constitutional: Positive for fatigue. Negative for chills and fever.  HENT: Negative for congestion, ear pain, rhinorrhea and sore throat.   Respiratory: Negative for cough and shortness of breath.   Cardiovascular: Negative for chest pain and palpitations.  Gastrointestinal: Negative for abdominal pain, constipation, diarrhea, nausea and vomiting.  Genitourinary: Negative for dysuria and urgency.  Musculoskeletal: Positive for arthralgias, back pain and myalgias (tingling sensation in both feet L>R , cramps in leg ).  Neurological: Negative for dizziness, weakness, light-headedness and headaches.  Psychiatric/Behavioral: Negative for dysphoric mood. The patient is not nervous/anxious.        Objective:    Physical Exam Vitals reviewed.  Constitutional:      Appearance: Normal appearance.  Cardiovascular:     Rate and Rhythm: Normal rate and regular rhythm.  Pulmonary:     Effort: Pulmonary effort is normal.     Breath  sounds: Normal breath sounds.  Abdominal:     General: Bowel sounds are normal.  Musculoskeletal:        General: Normal range of motion.     Cervical back: Normal range of motion.  Skin:    General: Skin is warm.  Neurological:     Mental Status: She is alert.  Psychiatric:        Mood and Affect: Mood normal.        Behavior: Behavior normal.    Diabetic Foot Exam - Simple   Simple Foot Form Diabetic Foot exam was performed with the following findings:  Yes 10/27/2019  4:52 PM  Visual Inspection No deformities, no ulcerations, no other skin breakdown bilaterally: Yes Sensation Testing See comments: Yes Pulse Check Posterior Tibialis and Dorsalis pulse intact bilaterally: Yes Comments Decreased sensation.      BP 124/60   Pulse 60   Temp 97.7 F (36.5 C)   Resp 16   Ht 5\' 5"  (1.651 m)   Wt 168 lb 3.2 oz (76.3 kg)   LMP 09/23/2012   BMI 27.99 kg/m  Wt Readings from Last 3 Encounters:  10/26/19 168 lb 3.2 oz (76.3 kg)  07/15/19 163 lb 3.2 oz (74 kg)  07/06/19 161 lb 6.4 oz (73.2 kg)    Health Maintenance Due  Topic Date Due  . Hepatitis C Screening  Never done  . COVID-19 Vaccine (1) Never done  . HIV Screening  Never done  . TETANUS/TDAP  Never done  . PAP SMEAR-Modifier  Never done  . MAMMOGRAM  Never done  . COLONOSCOPY  Never done  . OPHTHALMOLOGY EXAM  06/18/2019    There are no preventive care reminders to display for this patient.   Lab Results  Component Value Date   TSH 1.520 07/06/2019   Lab Results  Component Value Date   WBC 5.8 07/06/2019   HGB 14.2 07/06/2019   HCT 43.3 07/06/2019   MCV 92 07/06/2019   PLT 212 07/06/2019   Lab Results  Component Value Date   NA 142 07/06/2019   K 4.6 07/06/2019   CO2 27 10/20/2012   GLUCOSE 87 07/06/2019   BUN 16 07/06/2019   CREATININE 0.71 07/06/2019   BILITOT 0.5 07/06/2019   ALKPHOS 66 07/06/2019   AST 12 07/06/2019   PROT 6.8 07/06/2019   ALBUMIN 4.4 07/06/2019   CALCIUM 9.7  07/06/2019   Lab Results  Component Value Date   CHOL 228 (H) 07/06/2019   Lab Results  Component Value Date   HDL 74 07/06/2019   Lab Results  Component Value Date   LDLCALC 132 (H) 07/06/2019   Lab Results  Component Value Date   TRIG 126 07/06/2019   Lab Results  Component Value Date   CHOLHDL 3.1 07/06/2019   Lab Results  Component Value Date   HGBA1C 5.4 10/26/2019       Assessment & Plan:  1. Myalgia - Magnesium - Phosphorus - CK  2. Paresthesia - B12 and Folate Panel - TSH  3. Other fatigue - CBC with Differential/Platelet - Comprehensive metabolic panel  4. Hyperlipidemia associated with type 2 diabetes mellitus ([place]) Control: great Recommend check sugars fasting daily. Recommend check feet daily. Recommend annual eye exams. Medicines:none Continue to work on eating a healthy diet and exercise.  Labs drawn today.  .  - Hemoglobin A1c  5. Mixed hyperlipidemia  Well controlled.  No changes to medicines.  Continue to work on eating a healthy diet and exercise.  Labs drawn today.   No orders of the defined types were placed in this encounter.   Orders Placed This Encounter  Procedures  . Magnesium  . B12 and Folate Panel  . CBC with Differential/Platelet  . Comprehensive metabolic panel  . TSH  . Phosphorus  . CK  . Hemoglobin A1c     Follow-up: No follow-ups on file.  An After Visit Summary was printed and given to the patient.  [name] [facility] [phone]

## 2019-10-26 ENCOUNTER — Encounter: Payer: Self-pay | Admitting: Family Medicine

## 2019-10-26 ENCOUNTER — Other Ambulatory Visit: Payer: Self-pay

## 2019-10-26 ENCOUNTER — Ambulatory Visit (INDEPENDENT_AMBULATORY_CARE_PROVIDER_SITE_OTHER): Payer: Medicare HMO | Admitting: Family Medicine

## 2019-10-26 VITALS — BP 124/60 | HR 60 | Temp 97.7°F | Resp 16 | Ht 65.0 in | Wt 168.2 lb

## 2019-10-26 DIAGNOSIS — E785 Hyperlipidemia, unspecified: Secondary | ICD-10-CM

## 2019-10-26 DIAGNOSIS — R5383 Other fatigue: Secondary | ICD-10-CM | POA: Diagnosis not present

## 2019-10-26 DIAGNOSIS — E1169 Type 2 diabetes mellitus with other specified complication: Secondary | ICD-10-CM | POA: Diagnosis not present

## 2019-10-26 DIAGNOSIS — R202 Paresthesia of skin: Secondary | ICD-10-CM

## 2019-10-26 DIAGNOSIS — E782 Mixed hyperlipidemia: Secondary | ICD-10-CM

## 2019-10-26 DIAGNOSIS — G720 Drug-induced myopathy: Secondary | ICD-10-CM | POA: Insufficient documentation

## 2019-10-26 DIAGNOSIS — M791 Myalgia, unspecified site: Secondary | ICD-10-CM

## 2019-10-27 ENCOUNTER — Encounter: Payer: Self-pay | Admitting: Family Medicine

## 2019-10-27 LAB — HEMOGLOBIN A1C
Est. average glucose Bld gHb Est-mCnc: 108 mg/dL
Hgb A1c MFr Bld: 5.4 % (ref 4.8–5.6)

## 2019-10-29 LAB — CBC WITH DIFFERENTIAL/PLATELET
Basophils Absolute: 0.1 10*3/uL (ref 0.0–0.2)
Basos: 1 %
EOS (ABSOLUTE): 0.1 10*3/uL (ref 0.0–0.4)
Eos: 1 %
Hematocrit: 43.5 % (ref 34.0–46.6)
Hemoglobin: 14.3 g/dL (ref 11.1–15.9)
Immature Grans (Abs): 0 10*3/uL (ref 0.0–0.1)
Immature Granulocytes: 0 %
Lymphocytes Absolute: 1.5 10*3/uL (ref 0.7–3.1)
Lymphs: 26 %
MCH: 30.4 pg (ref 26.6–33.0)
MCHC: 32.9 g/dL (ref 31.5–35.7)
MCV: 92 fL (ref 79–97)
Monocytes Absolute: 0.4 10*3/uL (ref 0.1–0.9)
Monocytes: 7 %
Neutrophils Absolute: 3.7 10*3/uL (ref 1.4–7.0)
Neutrophils: 65 %
Platelets: 223 10*3/uL (ref 150–450)
RBC: 4.71 x10E6/uL (ref 3.77–5.28)
RDW: 12.9 % (ref 11.7–15.4)
WBC: 5.8 10*3/uL (ref 3.4–10.8)

## 2019-10-29 LAB — B12 AND FOLATE PANEL
Folate: 10.2 ng/mL (ref >3.0)
Vitamin B-12: >2000 pg/mL — ABNORMAL HIGH (ref 232–1245)

## 2019-10-29 LAB — COMPREHENSIVE METABOLIC PANEL
ALT: 11 IU/L (ref 0–32)
AST: 15 IU/L (ref 0–40)
Albumin/Globulin Ratio: 1.7 (ref 1.2–2.2)
Albumin: 4.3 g/dL (ref 3.8–4.9)
Alkaline Phosphatase: 66 IU/L (ref 48–121)
BUN/Creatinine Ratio: 20 (ref 9–23)
BUN: 17 mg/dL (ref 6–24)
Bilirubin Total: 0.5 mg/dL (ref 0.0–1.2)
CO2: 26 mmol/L (ref 20–29)
Calcium: 9.6 mg/dL (ref 8.7–10.2)
Chloride: 103 mmol/L (ref 96–106)
Creatinine, Ser: 0.86 mg/dL (ref 0.57–1.00)
GFR calc Af Amer: 87 mL/min/{1.73_m2} (ref >59)
GFR calc non Af Amer: 76 mL/min/{1.73_m2} (ref >59)
Globulin, Total: 2.5 g/dL (ref 1.5–4.5)
Glucose: 57 mg/dL — ABNORMAL LOW (ref 65–99)
Potassium: 4.8 mmol/L (ref 3.5–5.2)
Sodium: 142 mmol/L (ref 134–144)
Total Protein: 6.8 g/dL (ref 6.0–8.5)

## 2019-10-29 LAB — PHOSPHORUS: Phosphorus: 4.2 mg/dL (ref 3.0–4.3)

## 2019-10-29 LAB — MAGNESIUM: Magnesium: 2 mg/dL (ref 1.6–2.3)

## 2019-10-29 LAB — TSH: TSH: 1.6 u[IU]/mL (ref 0.450–4.500)

## 2019-10-29 LAB — CK: Total CK: 39 U/L (ref 32–182)

## 2019-10-31 ENCOUNTER — Other Ambulatory Visit: Payer: Self-pay | Admitting: Family Medicine

## 2019-10-31 DIAGNOSIS — M791 Myalgia, unspecified site: Secondary | ICD-10-CM

## 2019-10-31 DIAGNOSIS — R202 Paresthesia of skin: Secondary | ICD-10-CM

## 2019-11-23 ENCOUNTER — Ambulatory Visit: Admit: 2019-11-23 | Discharge: 2019-11-24 | Payer: MEDICARE | Attending: Internal Medicine | Primary: Internal Medicine

## 2019-11-23 DIAGNOSIS — Z79899 Other long term (current) drug therapy: Secondary | ICD-10-CM | POA: Diagnosis not present

## 2019-11-23 DIAGNOSIS — R109 Unspecified abdominal pain: Secondary | ICD-10-CM | POA: Diagnosis not present

## 2019-11-23 DIAGNOSIS — K50919 Crohn's disease, unspecified, with unspecified complications: Secondary | ICD-10-CM | POA: Diagnosis not present

## 2019-11-23 MED ORDER — HYOSCYAMINE 0.125 MG SUBLINGUAL TABLET
ORAL_TABLET | SUBLINGUAL | 1 refills | 5.00000 days | Status: CP | PRN
Start: 2019-11-23 — End: ?

## 2019-11-23 NOTE — Unmapped (Addendum)
[facility]  CONSULT NOTE - INFLAMMATORY BOWEL DISEASE  11/23/2019    Demographics:  Brenda Beard is a 54 y.o. year old female    Referring physician:   Jules Husbands, MD  [name]  [name]  [place],  [place] [address]          HPI / NOTE :     Today, I saw Brenda Beard for follow up consultation in the [facility] at the request of Dr. Sedalia Muta regarding Crohn's Disease.  Prior records including available clinical notes, endoscopy reports, imaging results and pathology results were reviewed in detail as part of this follow up consultation.     CC: diarrhea     HPI:  Brenda Beard is a 54 year old woman with long-standing ileocolonic Crohn's disease as well as some rectal issues. She was on Remicade for a prolonged period of time and did quite well. Due to insurance reasons she ended up having to come off of the Remicade and was eventually transitioned to ustekinumab as part of the ustekinumab trial. She did very well on the ustekinumab and at the end of the trial she was transitioned to commercial ustekinumab which she has been maintained on for several years at this point.     She was last seen as a virtual visit in November 2020.  At that time, she had been diagnosed with Covid which has resolved with some ongoing symptoms such as fatigue.  In December, she had worsening rectal pain and was found by MRI to have a 2cm perirectal abscess. She was started on ciprofloxacin and reduced dose metronidazole (250 mg TID, due to intolerance from peripheral neuropathy). She was seen by Dr. Epifania Gore on 04/23/19. Symptoms were improved on antibiotics and the abscess was noted too small for drainage. It was recommended that she be on antibiotics for 3 months, and likely indefinitely depending on tolerance.    Since that time, she has had no further rectal pain or fistulas. She discontinued antibiotics sometime in January due to symptom resolution.    She has noticed a slight increase in BM frequency for the last 3-4 weeks. She is having 3-5 BMs per day, [place] 4-6. Previously was having 2 BMs per day. BMs are associated with crampy, RLQ pain. This was preceded by a couple weeks of constipation with more infrequent, hard bowel movements. Otherwise, no upper GI symptoms. No blood in the stool.    Review of Systems: positive as per HPI   Otherwise, the balance of 10 systems is negative.          IBD HISTORY:     Year of disease onset:  1989  Diagnosis:  Crohn's Disease  Age at onset:   64-40 yr old (A2)  Location:  Ileocolonic (L3)  Behavior:  Penetrating (B3)  - per Dr. Erma Heritage notes, I do not have access to these earlier records   Perianal Dz:  Yes    Brief IBD Disease Course:    1989 diagnosis ileal Crohn's disease. Ultimately progressed to ileocolonic disease with perineal involvement with recurrent perirectal abscesses. 2000 ileocolic resection. Prior treatment with 6-MP resulting in pancreatitis, Cellcept. 2002 start Remicade which she remained on for approximately 10 years, she did have arthralgias on Remicade. 09/2011 insurance issues no longer able to get Remicade. 02/2012 enrolled in ustekinumab trial from 2014 - 2018. 06/2016 rectal abscess which spontaneously drained. 03/2017 colon with endoscopic remission. 2019 commercial ustekinumab started. 02/2019 ?perirectal abscess that spontaneous  drained, treated with cipro. 04/2019, perirectal abscess treated with cipro/flagyl.    *Reports prior history of neuropathy with flagyl - more tolerable with reduced dose  *History of hysterectomy        Endoscopy:      Colon 03/13/17: Patent ileocolonic anastomosis. Diverticulosis in sigmoid colon and descending colon. Nodule in descending colon. Biopsied. Excess tissue in rectum raising question of rectal prolapse.    Colon 03/17/13: Anal canal stenosis. Patent ileocolonic anastomosis. Diverticulosis in left colon. 4 mm polyp in descending colon. Otherwise normal.    Colon 04/30/12: Anal canal stenosis, dilated to 18 mm with Hegar dilator. Aphthous ulceration in TI. Marland Kitchen Moderately congested mucosa in rectosigmoid. Patent ileocolonic anastomosis in ascending colon.     Colon 03/05/12: Anal stricture. Diverticulosis in left colon. Erythematous mucosa in the rectum. Aphthae in rectum. Patent anastomosis.     Colon 08/24/09: Nodule in rectum, biopsied. Diverticulosis left colon, transverse colon. Patent ileocolonic anastomosis.     Colon 03/29/05: Ileocolonic anastomosis in ascending colon. Otherwise normal colon.     Imaging:    Colon 02/28/03: Normal TI. Ileocolonic anastomosis in ascending colon. Normal colon.    Colon 10/30/00: Featureless mucosa in rectum. Ulceration at the ileocolonic anastomosis that was superficial and circumferential. Neoterminal ileum with a few scattered aphthous ulcerations.     Prior IBD medications (type, dose, duration, response):  - 6-MP: pancreatitis  - Remicade: treated for many years until insurance stopped covering, did have arthralgias on this therapy as well  - Cellcept: details not known  - Stelara: [facility] clinical trial as well as commercial product           Past Medical History:   Past medical history:   Past Medical History:   Diagnosis Date   ??? Constipation    ??? Crohn's disease (CMS-HCC)    ??? DDD (degenerative disc disease), lumbar    ??? Diabetes mellitus (CMS-HCC)     Borderline, doesn't take meds and doesn't check sugars.  Diet managed.   ??? H/O hypotension     States occurred post hysterectomy and colon surgery.   ??? Heart murmur    ??? High cholesterol    ??? Hypertension    ??? Incomplete defecation     intermittent   ??? Joint pain    ??? Rectocele    ??? Straining during bowel movements      Past surgical history:   Past Surgical History:   Procedure Laterality Date   ??? ANAL FISSURECTOMY  2007    [place]   ??? APPENDECTOMY      Childhood   ??? BACK SURGERY     ??? COLON SURGERY  1992 and 1999    small intestine-total 20 inches removed   ??? EYE SURGERY  2017    cataract surgery ??? HEMORRHOID SURGERY  1992   ??? HYSTERECTOMY      Complete   ??? PR ANAL PRESSURE RECORD N/A 05/01/2018    Procedure: ANORECTAL MANOMETRY;  Surgeon: Nurse-Based Giproc;  [facility];  Service: Gastroenterology   ??? PR COLONOSCOPY W/BIOPSY SINGLE/MULTIPLE  03/17/2013    Procedure: COLONOSCOPY, FLEXIBLE, PROXIMAL TO SPLENIC FLEXURE; WITH BIOPSY, SINGLE OR MULTIPLE;  Surgeon: Teodoro Spray, MD;  Location: [facility] [facility];  Service: Gastroenterology   ??? PR COLONOSCOPY W/BIOPSY SINGLE/MULTIPLE N/A 03/13/2017    Procedure: COLONOSCOPY, FLEXIBLE, PROXIMAL TO SPLENIC FLEXURE; WITH BIOPSY, SINGLE OR MULTIPLE;  Surgeon: [name], MD;  [facility];  Service: Gastroenterology   ??? PR LAMINEC/FACETECT/FORAMIN,LUMBAR 1 SEG Bilateral 05/31/2015    Procedure: L4-5 HEMILAMINECTOMY, DISCECTOMY;  Surgeon: Malachy Chamber, MD;  Location: OR [facility];  Service: Orthopedics   ??? TONSILLECTOMY       Family history:   Family History   Problem Relation Age of Onset   ??? Hypertension Mother    ??? Diabetes Mother    ??? Heart disease Mother         CHF   ??? Atrial fibrillation Mother    ??? Atrial fibrillation Father    ??? Diabetes Father      Social history: Widowed, husband died several years ago. Lives in [place], [place] but also has condo at [place]  (near [employer]  Social History     Socioeconomic History   ??? Marital status: Widowed     Spouse name: None   ??? Number of children: None   ??? Years of education: None   ??? Highest education level: None   Occupational History   ??? None   Tobacco Use   ??? Smoking status: Former Smoker     Packs/day: 1.00     Years: 12.00     Pack years: 12.00     Quit date: 03/17/2001     Years since quitting: 18.6   ??? Smokeless tobacco: Never Used   Substance and Sexual Activity   ??? Alcohol use: Yes     Comment: maybe once a month   ??? Drug use: No   ??? Sexual activity: None   Other Topics Concern   ??? None   Social History Narrative   ??? None     Social Determinants of Health     Financial Resource Strain:    ??? Difficulty of Paying Living Expenses:    Food Insecurity:    ??? Worried About Programme researcher, broadcasting/film/video in the Last Year:    ??? Barista in the Last Year:    Transportation Needs:    ??? Freight forwarder (Medical):    ??? Lack of Transportation (Non-Medical):    Physical Activity:    ??? Days of Exercise per Week:    ??? Minutes of Exercise per Session:    Stress:    ??? Feeling of Stress :    Social Connections:    ??? Frequency of Communication with Friends and Family:    ??? Frequency of Social Gatherings with Friends and Family:    ??? Attends Religious Services:    ??? Database administrator or Organizations:    ??? Attends Banker Meetings:    ??? Marital Status:              Allergies:     Allergies   Allergen Reactions   ??? Mercaptopurine Other (See Comments)     Other reaction(s): Pancreatitis.  Used to treat pt. Crohn's Disease.  Caused Pancreatis 1992.       Other reaction(s): Other (See Comments)  Used to treat pt. Crohn's Disease.  Caused Pancreatis 1992.   ??? Amitriptyline    ??? Metoprolol Other (See Comments)     Extreme fatigue   ??? Simvastatin    ??? Trazodone Hcl    ??? Morphine Itching     States can take short term.   ??? Penicillins Rash                   Medications:     Current Outpatient Medications   Medication Sig  Dispense Refill   ??? ALPRAZolam (XANAX) 0.5 MG tablet Take 1 tablet (0.5 mg total) by mouth Three (3) times a day as needed for sleep or anxiety. 90 tablet 0   ??? biotin 1 mg tablet Take 1,000 mcg by mouth every morning before breakfast.     ??? estradiol (ESTRACE) 1 MG tablet Take 1 mg by mouth nightly.      ??? FUROSEMIDE ORAL Take 10 mg by mouth daily as needed.     ??? gabapentin (NEURONTIN) 300 MG capsule Take 600 mg by mouth nightly.      ??? glucosamine-chondroitin 500-400 mg tablet Take 2 tablets by mouth nightly.      ??? omega-3 fatty acids-fish oil (FISH OIL) 300-1,000 mg cap Take 1 capsule by mouth daily. Last taken 05/26/15     ??? UNABLE TO FIND Take 1,000 mg by mouth two (2) times a day. Med Name: tumeric     ??? UNABLE TO FIND Take 1 capsule by mouth two (2) times a day. Med Name: cherry extract     ??? ustekinumab (STELARA) 90 mg/mL Syrg syringe Inject the contents of 1 syringe (90mg ) under the skin every 8 weeks. 1 mL 0   ??? metroNIDAZOLE (FLAGYL) 500 MG tablet Take 1 tablet (500 mg total) by mouth two (2) times a day. (Patient not taking: Reported on 11/23/2019) 28 tablet 0     No current facility-administered medications for this visit.             Physical Exam:   BP 142/78  - Pulse 52  - Temp 36.4 ??C  - Wt 78.6 kg (173 lb 3.2 oz)  - SpO2 99%  - BMI 27.96 kg/m??     GEN: no apparent distress, appears comfortable on exam  HEENT:EOMI, no scleral icterus   NEURO:  gait normal, non-focal, no obvious neurologic abnormality  LUNGS: normal work of breathing, no audible wheezing   CV: RRR, no edema   ABD: soft, nondistended, nontender   Extremities: no cyanosis, clubbing or edema, normal gait  Psych: affect appropriate, A&O x3  SKIN: no visible lesions on face, neck, arms, abdomen  PERIANAL / RECTAL EXAM: deferred          Labs, Data & Indices:     Lab Review:   Lab Results   Component Value Date    WBC 7.0 02/23/2019    WBC 11.9 (H) 08/21/2010    RBC 4.76 02/23/2019    RBC 4.48 08/21/2010    HGB 13.8 02/23/2019    HGB 13.2 08/21/2010     Lab Results   Component Value Date    AST 18 02/23/2019    AST 19 08/21/2010    ALT 13 02/23/2019    ALT 29 08/21/2010    BUN 15 02/23/2019    Creatinine 0.72 02/23/2019    CO2 26.0 02/23/2019    Albumin 4.2 02/23/2019    Calcium 9.6 02/23/2019     No results found for: TSH     ............................................................................................................................................Marland Kitchen          Assessment & Recommendations:     Brenda Beard is a 54 y.o. female with history of ileocolonic Crohn's disease with perineal involvement s/p ileocolonic resection in 2000. She was previously treated with infliximab which was stopped due to insurance issues and has been maintained on ustekinumab since 2014. Her last endoscopy in 2018 confirmed that her Crohn's was in remission. She previously had a perirectal abscess at last visit that resolved with ciprofloxacin  and reduced dose metronidazole. She has been off antibiotics since January without symptom recurrence.    Lately, she's been having more frequent bowel movements and abdominal cramping. She denies perianal disease currently. Etiology of the bowel change/pain is unclear, but could be worrisome for active Crohn's disease despite no change in her current regimen of Stelara. Will trial hyoscyamine for symptomatic management while awaiting endoscopic evaluation.    We also discussed the importance of prevention today including the need for annual influenza vaccine. She will talk with PCP about Pneumovax vaccine. She reports that she did get Prevnar, but it made her feel unwell so she'd like to talk with her PCP about Pneumovax. She has received COVID vaccine. Given her age and immunosuppression, Shingrix vaccine series would also be recommended, but she'd like to wait on this.    PLAN:  1. Crohn's disease  - continue Stelara every 8 weeks  - trial of hyoscyamine for abdominal cramping; discussed [email or web address] coupon as well  - labs updated 10/26/19 and reviewed in CareEverywhere  - colonoscopy to assess for active Crohn's    2. Perineal disease, improved  - not currently on antibiotics - will hold ciprofloxacin and metronidazole for now with low threshold to restart/reimage with any worsening symptoms  - previously followed with Dr. Epifania Gore so will need to establish with new surgeon for any future needs d/t his retirement    3. High risk medication monitoring (Stelara)  - Quant gold and HBV serologies updated 02/2019  - discussed need for at least annual monitoring labs, currently up to date (10/26/19)    4. Prevention  - recommend annual influenza vaccine next fall  - received Prevnar 2019 with PCP per pt, recommend Pneumovax  - recommend Shingrix vaccine series    - pap: h/o hysterectomy, need to discuss at next visit whether still has cervix in place  - CRC surveillance - due in 2023 or sooner if worsening symptoms    5. Follow up in 3 months or sooner as needed  --------------------------------------------  Nita Sickle, MD  [facility]  [facility]

## 2019-11-23 NOTE — Unmapped (Addendum)
INSTRUCTIONS:     1.  Reviewed recent labs  2.  Recommend colonoscopy - can call [phone] option 1 then option 2 to schedule  3.  Continue Stelara every 8 weeks   4.  Trial of hyoscyamine (Levsin) for abdominal cramping; can take goodrx.com coupon to pharmacy to see if this is more affordable   5.  Let us know if any new perianal symptoms and can add back antibiotics   6.  If any trouble or symptoms, do not hesitate to call.     Mardee Postin, MD  Division of Gastroenterology & Hepatology  [facility]    Clinical Nurse Contact:  Neta Mends, RN  Ph#  [phone]  Fax#  [phone]    For clinic appointments, please call, [phone], option 1.  For questions regarding radiology appointments, or to schedule, [phone].  For questions regarding scheduling GI procedures (e.g,. Colonoscopy), please call, [phone], option 2.    For educational material and resources:  [email or web address]  ============================================

## 2019-11-26 MED ORDER — BISACODYL 5 MG TABLET,DELAYED RELEASE
ORAL_TABLET | Freq: Once | ORAL | 0 refills | 1.00000 days | Status: CP
Start: 2019-11-26 — End: 2019-11-26
  Filled 2019-11-30: qty 2, 1d supply, fill #0

## 2019-11-26 MED ORDER — POLYETHYLENE GLYCOL 3350 17 GRAM/DOSE ORAL POWDER: g | 0 refills | 0 days | Status: AC

## 2019-11-26 MED ORDER — SIMETHICONE 125 MG CHEWABLE TABLET
ORAL_TABLET | ORAL | 0 refills | 0 days | Status: CP
Start: 2019-11-26 — End: ?
  Filled 2019-11-30: qty 4, 1d supply, fill #0

## 2019-11-26 MED ORDER — POLYETHYLENE GLYCOL 3350 17 GRAM/DOSE ORAL POWDER
ORAL | 0 refills | 0.00000 days | Status: CP
Start: 2019-11-26 — End: ?

## 2019-11-30 MED FILL — CLEARLAX 17 GRAM/DOSE ORAL POWDER: 1 days supply | Qty: 238 | Fill #0

## 2019-11-30 MED FILL — SIMETHICONE 125 MG CHEWABLE TABLET: 1 days supply | Qty: 4 | Fill #0 | Status: AC

## 2019-11-30 MED FILL — BISACODYL 5 MG TABLET,DELAYED RELEASE: 1 days supply | Qty: 2 | Fill #0 | Status: AC

## 2019-11-30 MED FILL — CLEARLAX 17 GRAM/DOSE ORAL POWDER: 1 days supply | Qty: 238 | Fill #0 | Status: AC

## 2019-11-30 MED FILL — CLEARLAX 17 GRAM/DOSE ORAL POWDER: 1 days supply | Qty: 119 | Fill #0

## 2019-11-30 MED FILL — CLEARLAX 17 GRAM/DOSE ORAL POWDER: 1 days supply | Qty: 119 | Fill #0 | Status: AC

## 2019-12-10 ENCOUNTER — Other Ambulatory Visit: Payer: Self-pay | Admitting: Family Medicine

## 2019-12-10 DIAGNOSIS — K50919 Crohn's disease, unspecified, with unspecified complications: Principal | ICD-10-CM

## 2019-12-10 MED ORDER — STELARA 90 MG/ML SUBCUTANEOUS SYRINGE
0 refills | 0 days
Start: 2019-12-10 — End: 2020-12-09

## 2019-12-10 NOTE — Unmapped (Signed)
[facility] Clinical Assessment & Refill Coordination Note    Ronald Vinsant, DOB: 1965/12/18  Phone: [phone] (home)     All above HIPAA information was verified with patient.     Was a Nurse, learning disability used for this call? No    Specialty Medication(s):   Inflammatory Disorders: Stelara     Current Outpatient Medications   Medication Sig Dispense Refill   ??? ALPRAZolam (XANAX) 0.5 MG tablet Take 1 tablet (0.5 mg total) by mouth Three (3) times a day as needed for sleep or anxiety. 90 tablet 0   ??? biotin 1 mg tablet Take 1,000 mcg by mouth every morning before breakfast.     ??? estradiol (ESTRACE) 1 MG tablet Take 1 mg by mouth nightly.      ??? FUROSEMIDE ORAL Take 10 mg by mouth daily as needed.     ??? gabapentin (NEURONTIN) 300 MG capsule Take 600 mg by mouth nightly.      ??? glucosamine-chondroitin 500-400 mg tablet Take 2 tablets by mouth nightly.      ??? hyoscyamine (LEVSIN/SL) 0.125 mg SL tablet Place 1 tablet (0.125 mg total) under the tongue every four (4) hours as needed for cramping. 30 tablet 1   ??? omega-3 fatty acids-fish oil (FISH OIL) 300-1,000 mg cap Take 1 capsule by mouth daily. Last taken 05/26/15     ??? polyethylene glycol (CLEARLAX) 17 gram/dose powder Take as directed per instruction sheet from [facility] Provider. 238 g 0   ??? polyethylene glycol (CLEARLAX) 17 gram/dose powder Take as directed per instruction sheet from [facility] Provider. 119 g 0   ??? simethicone (MYLICON) 125 MG chewable tablet Take as directed per instruction sheet from [facility] Provider. 4 tablet 0   ??? UNABLE TO FIND Take 1,000 mg by mouth two (2) times a day. Med Name: tumeric     ??? UNABLE TO FIND Take 1 capsule by mouth two (2) times a day. Med Name: cherry extract     ??? ustekinumab (STELARA) 90 mg/mL Syrg syringe Inject the contents of 1 syringe (90mg ) under the skin every 8 weeks. 1 mL 0     No current facility-administered medications for this visit.        Changes to medications: Careen reports no changes at this time.    Allergies   Allergen Reactions   ??? Mercaptopurine Other (See Comments)     Other reaction(s): Pancreatitis.  Used to treat pt. Crohn's Disease.  Caused Pancreatis 1992.       Other reaction(s): Other (See Comments)  Used to treat pt. Crohn's Disease.  Caused Pancreatis 1992.   ??? Amitriptyline    ??? Metoprolol Other (See Comments)     Extreme fatigue   ??? Simvastatin    ??? Trazodone Hcl    ??? Morphine Itching     States can take short term.   ??? Penicillins Rash             Changes to allergies: No    SPECIALTY MEDICATION ADHERENCE         Medication Adherence    Patient reported X missed doses in the last month: 0  Specialty Medication: Stelara 90mg /ml  Patient is on additional specialty medications: No  Informant: patient          Specialty medication(s) dose(s) confirmed: Regimen is correct and unchanged.     Are there any concerns with adherence? No    Adherence counseling provided? Not needed    CLINICAL MANAGEMENT  AND INTERVENTION      Clinical Benefit Assessment:    Do you feel the medicine is effective or helping your condition? Yes    Clinical Benefit counseling provided? Not needed    Adverse Effects Assessment:    Are you experiencing any side effects? No    Are you experiencing difficulty administering your medicine? No    Quality of Life Assessment:    How many days over the past month did your Crohn's  keep you from your normal activities? For example, brushing your teeth or getting up in the morning. 0    Have you discussed this with your provider? Not needed    Therapy Appropriateness:    Is therapy appropriate? Yes, therapy is appropriate and should be continued    DISEASE/MEDICATION-SPECIFIC INFORMATION      For patients on injectable medications: Patient currently has 0 doses left.  Next injection is scheduled for 8/6.    PATIENT SPECIFIC NEEDS     - Does the patient have any physical, cognitive, or cultural barriers? No    - Is the patient high risk? No     - Does the patient require a Care Management Plan? No     - Does the patient require physician intervention or other additional services (i.e. nutrition, smoking cessation, social work)? No      SHIPPING     Specialty Medication(s) to be Shipped:   Inflammatory Disorders: Stelara    Other medication(s) to be shipped: No additional medications requested for fill at this time     Changes to insurance: No    Delivery Scheduled: Yes, Expected medication delivery date: 8/5--patient requested.     Medication will be delivered via UPS to the confirmed prescription address in [facility].    The patient will receive a drug information handout for each medication shipped and additional FDA Medication Guides as required.  Verified that patient has previously received a Conservation officer, historic buildings.    All of the patient's questions and concerns have been addressed.    Julianne Rice   [facility] Specialty Pharmacist

## 2019-12-15 MED FILL — STELARA 90 MG/ML SUBCUTANEOUS SYRINGE: 56 days supply | Qty: 1 | Fill #0 | Status: AC

## 2019-12-15 MED FILL — STELARA 90 MG/ML SUBCUTANEOUS SYRINGE: SUBCUTANEOUS | 56 days supply | Qty: 1 | Fill #0

## 2019-12-16 NOTE — Chronic Care Management (AMB) (Deleted)
Chronic Care Management Pharmacy  Name: Brittany [name]  MRN: [id number] DOB: [birth year]  Chief Complaint/ HPI  Brittany [name],  54 y.o. , female presents for their Initial CCM visit with the clinical pharmacist via telephone due to COVID-19 Pandemic.  PCP : [name], MD  Their chronic conditions include: HTN, Crohn's disease, HLD, Degenerative Disc Disease.   Office Visits: 10/26/2019 - Myalgias/leg cramps - normal labs- proceed with [facility] pain clinic referral.  07/06/2019 - Bradycardia - referral to cardiology for pacemaker.  06/03/2019 - Medicare physical. Dexa scan 09/19. Eye exam 06/17/2018. Mammogram 06/10/2019.  03/26/2019 - COVID +.   Consult Visit: 11/23/2019 - Crohn's GI - try hyoscyamine while waiting for endoscopic evaluation. Increased diarrhea concerning for Crohn's active disease despite continued Stelara. Recommend Shingrix vaccine and Pneumovax.  07/15/2019 - 14 day Zio monitor sent to patient's home to evaluate palpitations.  Patient has lost weight and has been able to d/c blood pressure meds. Hyperlipidemia controlled with diet and exercise.  05/19/2019 - Dr. Harrington Challenger normal Ob/GYN visit.  04/23/2019 - Perirectal absess - treated with 3 months of antibiotics.   Medications: Outpatient Encounter Medications as of 12/17/2019  Medication Sig  . ALPRAZolam (XANAX) 0.5 MG tablet TAKE 1 TABLET BY MOUTH TWICE A DAY  . Biotin 1000 MCG tablet Take 1,000 mcg by mouth daily.  . calcium-vitamin D (OSCAL WITH D) 500-200 MG-UNIT per tablet Take 1 tablet by mouth daily.  . cholecalciferol (VITAMIN D3) 25 MCG (1000 UNIT) tablet Take 1,000 Units by mouth daily.  Marland Kitchen estradiol (ESTRACE) 1 MG tablet TAKE 1 TABLET EVERY DAY  . fish oil-omega-3 fatty acids 1000 MG capsule Take 1 g by mouth daily.  Marland Kitchen gabapentin (NEURONTIN) 300 MG capsule Take 600 mg by mouth at bedtime.  . Glucosamine-Chondroit-Vit C-Mn (GLUCOSAMINE 1500 COMPLEX PO) Glucosamine  . Melatonin 10 MG CAPS  Take 10 mg by mouth at bedtime.   . Misc Natural Products (TART CHERRY ADVANCED PO) Take by mouth daily.  . TURMERIC PO Take by mouth in the morning and at bedtime.  . ustekinumab (STELARA) 90 MG/ML SOSY injection Inject the contents of 1 syringe (90mg ) under the skin every 8 weeks.   No facility-administered encounter medications on file as of 12/17/2019.     Current Diagnosis/Assessment:  Goals Addressed   None     Hypertension   BP today is:  <140/90  Office blood pressures are  BP Readings from Last 3 Encounters:  10/26/19 124/60  07/15/19 (!) 150/68  07/06/19 110/74    Patient has failed these meds in the past: lisinopril 5 mg Patient is currently controlled on the following medications: lifestyle changes  Patient checks BP at home never  Patient home BP readings are ranging: not checking  We discussed diet and exercise extensively. Weight loss seems to help improve blood pressure. Being more active and eating less seems to be controlling bp. Did not want to go on additional medication. She loves sweets.She has a bp cuff but has left it at her mom's house for her to use. She can check if needed but controlled for the most part. Patient is staying active and working to maintain weight loss.   Plan  Continue control with diet and exercise   Hyperlipidemia   Lipid Panel     Component Value Date/Time   CHOL 228 (H) 07/06/2019 0910   TRIG 126 07/06/2019 0910   HDL 74 07/06/2019 0910   CHOLHDL 3.1 07/06/2019 0910   LDLCALC  132 (H) 07/06/2019 0910   LABVLDL 22 07/06/2019 0910     The 10-year ASCVD risk score Mikey Bussing DC Jr., et al., 2013) is: 4.5%   Values used to calculate the score:     Age: 52 years     Sex: Female     Is Non-Hispanic African American: No     Diabetic: Yes     Tobacco smoker: No     Systolic Blood Pressure: 132 mmHg     Is BP treated: No     HDL Cholesterol: 74 mg/dL     Total Cholesterol: 228 mg/dL   Patient has failed these meds in past:  simvastatin 20 mg, rosuvastatin 5 mg  Patient is currently uncontrolled on the following medications: fish oil  We discussed:  diet and exercise extensively. She stopped taking rosuvastatin once weekly dose due to leg pain. She is working on healthy lifestyle to continue to control cholesterol.   Plan  Continue control with diet and exercise  Crohn's Disease   Patient has failed these meds in past: Remicade Patient is currently controlled on the following medications: Stelara  We discussed: Covid back in October caused a slight issues with her Crohn's. Stelara is controlling her symptoms. Patient assistance for Stelara through her Cro[facility]'s specialist [facility]. Marland Kitchen   Plan  Continue current medications  Degenerative Disc Disease   Patient has failed these meds in past: oxycodone-apap, hydrocodone-apap, methocarbamol Patient is currently uncontrolled on the following medications: gabapentin 600 mg qhs  We discussed:  Has had some issues with leg. Her feet are tingling on the bottom. Back looked good at last scan so surgeon didn't feel that sensations were related to back. Thinks she has neuropathy or poor circulation. Bottom of feet tingle and has some pain. She plans to follow-up with surgeon for regular annual appointment and ask to assess feet tingling but will schedule with Dr. Tobie Poet if needed. She has been unable to tolerate tid dosing of gabapentin in the past but thinks that will be a recommendation. She does not like the groggy feeling it yielded in the past.    Plan  Continue current medications   Health Maintenance   Patient is currently controlled on the following medications:  - alprazolam 0.5 mg qhs - sleep - Calcium-Vitamin D 500-200 mg daily - bone health - Estradiol 1 mg daily - s/p hysterectomy in 2013 -Glucosamine-chondroitin-vitamin-c - joints - Melatonin 10 mg - sleep -turmeric - inflammation - tart cherry - inflammation -biotin - hair loss after COVID - vitamin  d- general health  We discussed:  Patient has added several medications to combat   Plan  Continue current medications  Vaccines   Reviewed and discussed patient's vaccination history.Second Covid Shot made her take a step back in energy improvement Second COVID shot completed 08/25/2019.     Immunization History  Administered Date(s) Administered  . Influenza-Unspecified 06/03/2019  . Pneumococcal Conjugate-13 11/01/2015  . Pneumococcal Polysaccharide-23 03/15/2013    Plan  Recommended patient receive annual flu vaccine in office.   Medication Management   Pt uses [facility] for all medications Uses pill box? Yes Pt endorses good compliance. Takes pm meds well at night occasionally misses a dose in the am.   We discussed: Patient is going to work to put her box in a place that she will see it. She is only taking vitamins in the morning. All of her prescriptions are at night.    Plan  Continue current medication  management strategy   Follow up: 3 month phone visit

## 2019-12-17 ENCOUNTER — Telehealth: Payer: Medicare HMO

## 2019-12-22 ENCOUNTER — Telehealth: Payer: Medicare HMO

## 2020-01-04 ENCOUNTER — Ambulatory Visit: Payer: Medicare HMO

## 2020-01-04 ENCOUNTER — Other Ambulatory Visit: Payer: Self-pay

## 2020-01-04 DIAGNOSIS — I10 Essential (primary) hypertension: Secondary | ICD-10-CM

## 2020-01-04 DIAGNOSIS — E785 Hyperlipidemia, unspecified: Secondary | ICD-10-CM

## 2020-01-04 NOTE — Patient Instructions (Signed)
Visit Information  Goals Addressed            This Visit's Progress   . Pharmacy Care Plan - HLD       CARE PLAN ENTRY (see longitudinal plan of care for additional care plan information)  Current Barriers:  . Uncontrolled hyperlipidemia, complicated by statin intolerance . Current antihyperlipidemic regimen: diet/lifestyle . Previous antihyperlipidemic medications tried simvastatin, rosuvastatin . Most recent lipid panel:     Component Value Date/Time   CHOL 228 (H) 07/06/2019 0910   TRIG 126 07/06/2019 0910   HDL 74 07/06/2019 0910   CHOLHDL 3.1 07/06/2019 0910   LDLCALC 132 (H) 07/06/2019 0910 .   Marland Kitchen ASCVD risk enhancing conditions: age >23, DM, HTN, CKD, CHF, current smoker . 10-year ASCVD risk score: 5%  Pharmacist Clinical Goal(s):  Marland Kitchen Over the next 90 days, patient will work with PharmD and providers towards optimized antihyperlipidemic therapy . Optimize diet and exercise to improve cholesterol levels.   Interventions: . Comprehensive medication review performed; medication list updated in electronic medical record.  Bertram Savin care team collaboration (see longitudinal plan of care) . Discussed importance of lowering ASCVD risk including risk/benefit of initiating statin therapy and controlling bp . Pharmacist helped coordinate follow-up appointment with Dr. Tobie Poet for visit and labs.   Patient Self Care Activities:  . Patient will focus on lifestyle by not eating so late in the day.  Initial goal documentation and Please see past updates related to this goal by clicking on the "Past Updates" button in the selected goal      . [place] (see longitudinal plan of care for additional care plan information)  Current Barriers:  . Chronic disease management support, education and care coordination needs related to hypertension, hyperlipidemia.  . Current antihypertensive regimen: diet/lifestyle . Previous  antihypertensives tried: lisinopril . Last practice recorded BP readings:  BP Readings from Last 3 Encounters:  10/26/19 124/60  07/15/19 (!) 150/68  07/06/19 110/74   . Current home BP readings: not checking . Most recent eGFR/CrCl: No results found for: EGFR  No components found for: CRCL  Pharmacist Clinical Goal(s):  Marland Kitchen Over the next 90 days, patient will work with PharmD and providers to maintain bp less than 140/90 mmHg  Interventions: . Inter-disciplinary care team collaboration (see longitudinal plan of care) . Comprehensive medication review performed; medication list updated in the electronic medical record.  . Discussed benefits of exercise and diet to reduce bp.   Patient Self Care Activities:  . Patient will continue to check BP as needed, document, and provide at future appointments  Initial goal documentation and Please see past updates related to this goal by clicking on the "Past Updates" button in the selected goal         The patient verbalized understanding of instructions provided today and declined a print copy of patient instruction materials.   Telephone follow up appointment with pharmacy team member scheduled for: 06/2020  Sherre Poot, PharmD, [facility] Clinical Pharmacist [name] [phone] (office) [phone] (mobile)   Dyslipidemia Dyslipidemia is an imbalance of waxy, fat-like substances (lipids) in the blood. The body needs lipids in small amounts. Dyslipidemia often involves a high level of cholesterol or triglycerides, which are types of lipids. Common forms of dyslipidemia include:  High levels of LDL cholesterol. LDL is the type of cholesterol that causes fatty deposits (plaques) to build up in the blood vessels that  carry blood away from your heart (arteries).  Low levels of HDL cholesterol. HDL cholesterol is the type of cholesterol that protects against heart disease. High levels of HDL remove the LDL buildup from  arteries.  High levels of triglycerides. Triglycerides are a fatty substance in the blood that is linked to a buildup of plaques in the arteries. What are the causes? Primary dyslipidemia is caused by changes (mutations) in genes that are passed down through families (inherited). These mutations cause several types of dyslipidemia. Secondary dyslipidemia is caused by lifestyle choices and diseases that lead to dyslipidemia, such as:  Eating a diet that is high in animal fat.  Not getting enough exercise.  Having diabetes, kidney disease, liver disease, or thyroid disease.  Drinking large amounts of alcohol.  Using certain medicines. What increases the risk? You are more likely to develop this condition if you are an older man or if you are a woman who has gone through menopause. Other risk factors include:  Having a family history of dyslipidemia.  Taking certain medicines, including birth control pills, steroids, some diuretics, and beta-blockers.  Smoking cigarettes.  Eating a high-fat diet.  Having certain medical conditions such as diabetes, polycystic ovary syndrome (PCOS), kidney disease, liver disease, or hypothyroidism.  Not exercising regularly.  Being overweight or obese with too much belly fat. What are the signs or symptoms? In most cases, dyslipidemia does not usually cause any symptoms. In severe cases, very high lipid levels can cause:  Fatty bumps under the skin (xanthomas).  White or gray ring around the black center (pupil) of the eye. Very high triglyceride levels can cause inflammation of the pancreas (pancreatitis). How is this diagnosed? Your health care provider may diagnose dyslipidemia based on a routine blood test (fasting blood test). Because most people do not have symptoms of the condition, this blood testing (lipid profile) is done on adults age 62 and older and is repeated every 5 years. This test checks:  Total cholesterol. This measures the  total amount of cholesterol in your blood, including LDL cholesterol, HDL cholesterol, and triglycerides. A healthy number is below 200.  LDL cholesterol. The target number for LDL cholesterol is different for each person, depending on individual risk factors. Ask your health care provider what your LDL cholesterol should be.  HDL cholesterol. An HDL level of 60 or higher is best because it helps to protect against heart disease. A number below 48 for men or below 71 for women increases the risk for heart disease.  Triglycerides. A healthy triglyceride number is below 150. If your lipid profile is abnormal, your health care provider may do other blood tests. How is this treated? Treatment depends on the type of dyslipidemia that you have and your other risk factors for heart disease and stroke. Your health care provider will have a target range for your lipid levels based on this information. For many people, this condition may be treated by lifestyle changes, such as diet and exercise. Your health care provider may recommend that you:  Get regular exercise.  Make changes to your diet.  Quit smoking if you smoke. If diet changes and exercise do not help you reach your goals, your health care provider may also prescribe medicine to lower lipids. The most commonly prescribed type of medicine lowers your LDL cholesterol (statin drug). If you have a high triglyceride level, your provider may prescribe another type of drug (fibrate) or an omega-3 fish oil supplement, or both. Follow these instructions  at home:  Eating and drinking  Follow instructions from your health care provider or dietitian about eating or drinking restrictions.  Eat a healthy diet as told by your health care provider. This can help you reach and maintain a healthy weight, lower your LDL cholesterol, and raise your HDL cholesterol. This may include: ? Limiting your calories, if you are overweight. ? Eating more fruits,  vegetables, whole grains, fish, and lean meats. ? Limiting saturated fat, trans fat, and cholesterol.  If you drink alcohol: ? Limit how much you use. ? Be aware of how much alcohol is in your drink. In the [place] one drink equals one 12 oz bottle of beer (355 mL), one 5 oz glass of wine (148 mL), or one 1 oz glass of hard liquor (44 mL).  Do not drink alcohol if: ? Your health care provider tells you not to drink. ? You are pregnant, may be pregnant, or are planning to become pregnant. Activity  Get regular exercise. Start an exercise and strength training program as told by your health care provider. Ask your health care provider what activities are safe for you. Your health care provider may recommend: ? 30 minutes of aerobic activity 4-6 days a week. Brisk walking is an example of aerobic activity. ? Strength training 2 days a week. General instructions  Do not use any products that contain nicotine or tobacco, such as cigarettes, e-cigarettes, and chewing tobacco. If you need help quitting, ask your health care provider.  Take over-the-counter and prescription medicines only as told by your health care provider. This includes supplements.  Keep all follow-up visits as told by your health care provider. Contact a health care provider if:  You are: ? Having trouble sticking to your exercise or diet plan. ? Struggling to quit smoking or control your use of alcohol. Summary  Dyslipidemia often involves a high level of cholesterol or triglycerides, which are types of lipids.  Treatment depends on the type of dyslipidemia that you have and your other risk factors for heart disease and stroke.  For many people, treatment starts with lifestyle changes, such as diet and exercise.  Your health care provider may prescribe medicine to lower lipids. This information is not intended to replace advice given to you by your health care provider. Make sure you discuss any questions you have  with your health care provider. Document Revised: 12/22/2017 Document Reviewed: 11/28/2017 Elsevier Patient Education  [place].

## 2020-01-04 NOTE — Chronic Care Management (AMB) (Signed)
Chronic Care Management Pharmacy  Name: Brittany [name]  MRN: [id number] DOB: 1966/04/03  Chief Complaint/ HPI  Brittany [name],  54 y.o. , female presents for their Initial CCM visit with the clinical pharmacist via telephone due to COVID-19 Pandemic.  PCP : [name], MD  Their chronic conditions include: HTN, Crohn's disease, HLD, Degenerative Disc Disease.   Office Visits: 10/26/2019 - Myalgias/leg cramps - normal labs- proceed with [place] neurosurgery pain clinic referral.  07/06/2019 - Bradycardia - referral to cardiology for pacemaker.  06/03/2019 - Medicare physical. Dexa scan 09/19. Eye exam 06/17/2018. Mammogram 06/10/2019.  03/26/2019 - COVID +.   Consult Visit: 11/23/2019 - Crohn's GI - try hyoscyamine while waiting for endoscopic evaluation. Increased diarrhea concerning for Crohn's active disease despite continued Stelara. Recommend Shingrix vaccine and Pneumovax.  07/15/2019 - 14 day Zio monitor sent to patient's home to evaluate palpitations.  Patient has lost weight and has been able to d/c blood pressure meds. Hyperlipidemia controlled with diet and exercise.  05/19/2019 - Dr. Harrington Challenger normal Ob/GYN visit.  04/23/2019 - Perirectal absess - treated with 3 months of antibiotics.   Medications: Outpatient Encounter Medications as of 01/04/2020  Medication Sig  . ALPRAZolam (XANAX) 0.5 MG tablet TAKE 1 TABLET BY MOUTH TWICE A DAY  . Biotin 1000 MCG tablet Take 1,000 mcg by mouth daily.  . calcium-vitamin D (OSCAL WITH D) 500-200 MG-UNIT per tablet Take 1 tablet by mouth daily.  . cholecalciferol (VITAMIN D3) 25 MCG (1000 UNIT) tablet Take 1,000 Units by mouth daily.  Marland Kitchen estradiol (ESTRACE) 1 MG tablet TAKE 1 TABLET EVERY DAY  . fish oil-omega-3 fatty acids 1000 MG capsule Take 1 g by mouth daily.  Marland Kitchen gabapentin (NEURONTIN) 300 MG capsule Take 600 mg by mouth at bedtime.  . Glucosamine-Chondroit-Vit C-Mn (GLUCOSAMINE 1500 COMPLEX PO) Glucosamine  . Melatonin 10 MG CAPS  Take 10 mg by mouth at bedtime.   . Misc Natural Products (TART CHERRY ADVANCED PO) Take by mouth daily.  . TURMERIC PO Take by mouth in the morning and at bedtime.  . ustekinumab (STELARA) 90 MG/ML SOSY injection Inject the contents of 1 syringe ($RemoveBe'90mg'[id number]$ ) under the skin every 8 weeks.   No facility-administered encounter medications on file as of 01/04/2020.   Allergies  Allergen Reactions  . Mercaptopurine     Used to treat pt. Crohn's Disease.  Caused Pancreatis 1992.  . Amitriptyline   . Metoprolol Other (See Comments)    Extreme fatigue Extreme fatigue   . Trazodone And Nefazodone   . Zocor [Simvastatin]   . Morphine And Related Itching  . Penicillins Rash   SDOH Screenings   Alcohol Screen:   . Last Alcohol Screening Score (AUDIT): Not on file  Depression (PHQ2-9): Low Risk   . PHQ-2 Score: 0  Financial Resource Strain:   . Difficulty of Paying Living Expenses: Not on file  Food Insecurity:   . Worried About Charity fundraiser in the Last Year: Not on file  . Ran Out of Food in the Last Year: Not on file  Housing: Low Risk   . Last Housing Risk Score: 0  Physical Activity:   . Days of Exercise per Week: Not on file  . Minutes of Exercise per Session: Not on file  Social Connections:   . Frequency of Communication with Friends and Family: Not on file  . Frequency of Social Gatherings with Friends and Family: Not on file  . Attends Religious Services: Not on file  .  Active Member of Clubs or Organizations: Not on file  . Attends Archivist Meetings: Not on file  . Marital Status: Not on file  Stress:   . Feeling of Stress : Not on file  Tobacco Use: Medium Risk  . Smoking Tobacco Use: Former Smoker  . Smokeless Tobacco Use: Never Used  Transportation Needs: No Transportation Needs  . Lack of Transportation (Medical): No  . Lack of Transportation (Non-Medical): No     Current Diagnosis/Assessment:  Goals Addressed            This Visit's  Progress   . Pharmacy Care Plan - HLD       CARE PLAN ENTRY (see longitudinal plan of care for additional care plan information)  Current Barriers:  . Uncontrolled hyperlipidemia, complicated by statin intolerance . Current antihyperlipidemic regimen: diet/lifestyle . Previous antihyperlipidemic medications tried simvastatin, rosuvastatin . Most recent lipid panel:     Component Value Date/Time   CHOL 228 (H) 07/06/2019 0910   TRIG 126 07/06/2019 0910   HDL 74 07/06/2019 0910   CHOLHDL 3.1 07/06/2019 0910   LDLCALC 132 (H) 07/06/2019 0910 .   Marland Kitchen ASCVD risk enhancing conditions: age >56, DM, HTN, CKD, CHF, current smoker . 10-year ASCVD risk score: 5%  Pharmacist Clinical Goal(s):  Marland Kitchen Over the next 90 days, patient will work with PharmD and providers towards optimized antihyperlipidemic therapy . Optimize diet and exercise to improve cholesterol levels.   Interventions: . Comprehensive medication review performed; medication list updated in electronic medical record.  Bertram Savin care team collaboration (see longitudinal plan of care) . Discussed importance of lowering ASCVD risk including risk/benefit of initiating statin therapy and controlling bp . Pharmacist helped coordinate follow-up appointment with Dr. Tobie Poet for visit and labs.   Patient Self Care Activities:  . Patient will focus on lifestyle by not eating so late in the day.  Initial goal documentation and Please see past updates related to this goal by clicking on the "Past Updates" button in the selected goal      . [place] (see longitudinal plan of care for additional care plan information)  Current Barriers:  . Chronic disease management support, education and care coordination needs related to hypertension, hyperlipidemia.  . Current antihypertensive regimen: diet/lifestyle . Previous antihypertensives tried: lisinopril . Last practice recorded BP readings:  BP  Readings from Last 3 Encounters:  10/26/19 124/60  07/15/19 (!) 150/68  07/06/19 110/74   . Current home BP readings: not checking . Most recent eGFR/CrCl: No results found for: EGFR  No components found for: CRCL  Pharmacist Clinical Goal(s):  Marland Kitchen Over the next 90 days, patient will work with PharmD and providers to maintain bp less than 140/90 mmHg  Interventions: . Inter-disciplinary care team collaboration (see longitudinal plan of care) . Comprehensive medication review performed; medication list updated in the electronic medical record.  . Discussed benefits of exercise and diet to reduce bp.   Patient Self Care Activities:  . Patient will continue to check BP as needed, document, and provide at future appointments  Initial goal documentation and Please see past updates related to this goal by clicking on the "Past Updates" button in the selected goal         Hypertension   BP today is:  <140/90  Office blood pressures are  BP Readings from Last 3 Encounters:  10/26/19 124/60  07/15/19 (!) 150/68  07/06/19 110/74  Patient has failed these meds in the past: lisinopril 5 mg Patient is currently controlled on the following medications: lifestyle changes  Patient checks BP at home never  Patient home BP readings are ranging: not checking  We discussed diet and exercise extensively. Weight loss seems to help improve blood pressure. Being more active and eating less seems to be controlling bp. Did not want to go on additional medication. She loves sweets.She has a bp cuff but has left it at her mom's house for her to use. She can check if needed but controlled for the most part. Patient is staying active and working to maintain weight loss.   Plan  Continue control with diet and exercise   Hyperlipidemia   Lipid Panel     Component Value Date/Time   CHOL 228 (H) 07/06/2019 0910   TRIG 126 07/06/2019 0910   HDL 74 07/06/2019 0910   CHOLHDL 3.1 07/06/2019 0910     LDLCALC 132 (H) 07/06/2019 0910   LABVLDL 22 07/06/2019 0910     The 10-year ASCVD risk score Mikey Bussing DC Jr., et al., 2013) is: 4.5%   Values used to calculate the score:     Age: 30 years     Sex: Female     Is Non-Hispanic African American: No     Diabetic: Yes     Tobacco smoker: No     Systolic Blood Pressure: 672 mmHg     Is BP treated: No     HDL Cholesterol: 74 mg/dL     Total Cholesterol: 228 mg/dL   Patient has failed these meds in past: simvastatin 20 mg, rosuvastatin 5 mg  Patient is currently uncontrolled on the following medications:   fish oil bid  We discussed:  diet and exercise extensively. She stopped taking rosuvastatin once weekly dose due to leg pain. She is working on healthy lifestyle to continue to control cholesterol.   Update 01/04/2020 - Patient feels that she has lost some weight this summer. Stays active with yard work and exercising some. Has been using stability ball for exercises and some dumbbells. Has not been using rosuvastatin once weekly and wants to have blood work rechecked at next visit before resuming. Coordinating scheduling follow-up visit with Dr. Tobie Poet.   Plan  Continue control with diet and exercise  Crohn's Disease   Patient has failed these meds in past: Remicade Patient is currently controlled on the following medications: Stelara  We discussed: Covid back in October caused a slight issues with her Crohn's. Stelara is controlling her symptoms. Patient assistance for Stelara through [facility].   Update 01/04/2020 - Patient reports updated colonoscopy scheduled in October with GI. Symptoms have resolved more since her last visit. Stress has been decreased lately and symptoms have been reduced.   Plan  Continue current medications  Degenerative Disc Disease   Patient has failed these meds in past: oxycodone-apap, hydrocodone-apap, methocarbamol Patient is currently uncontrolled on the following medications:  gabapentin 600 mg qhs  We discussed:  Has had some issues with leg. Her feet are tingling on the bottom. Back looked good at last scan so surgeon didn't feel that sensations were related to back. Thinks she has neuropathy or poor circulation. Bottom of feet tingle and has some pain. She plans to follow-up with surgeon for regular annual appointment and ask to assess feet tingling but will schedule with Dr. Tobie Poet if needed. She has been unable to tolerate tid dosing of gabapentin in the past  but thinks that will be a recommendation. She does not like the groggy feeling it yielded in the past.   Update 01/04/2020 - Patient scheduled for nerve test 02/01/2020.    Plan  Continue current medications   Health Maintenance   Patient is currently controlled on the following medications:  - alprazolam 0.5 mg qhs - sleep - Calcium-Vitamin D 500-200 mg daily - bone health - Estradiol 1 mg daily - s/p hysterectomy in 2013 -Glucosamine-chondroitin-vitamin-c - joints - Melatonin 10 mg - sleep -turmeric - inflammation - tart cherry - inflammation -biotin - hair loss after COVID - vitamin d- general health  We discussed:  Patient has added several medications to combat post-COVID effects.   Plan  Continue current medications  Vaccines   Reviewed and discussed patient's vaccination history.Second Covid Shot made her take a step back in energy improvement Second COVID shot completed 08/25/2019.     Immunization History  Administered Date(s) Administered  . Influenza-Unspecified 06/03/2019  . Pneumococcal Conjugate-13 11/01/2015  . Pneumococcal Polysaccharide-23 03/15/2013   Vaccination - COVID 07/30/2019 and 08/25/2019 - [place].  Update 01/04/2020 - We discussed Dr. Tobie Poet recommended shingles shot for patient. Patient has not had yet but plans to reach out to her insurance concerning cost. Patient uncertain if she will take a COVID booster coming up. We discussed the benefits/risks associated. She  plans to consult her GI doctor as well at her next visit to help determine her decision.   Plan  Recommended patient receive annual flu vaccine in office.   Medication Management   Pt uses [facility] for all medications Uses pill box? Yes Pt endorses good compliance. Takes pm meds well at night occasionally misses a dose in the am.   We discussed: Patient is going to work to put her box in a place that she will see it. She is only taking vitamins in the morning. All of her prescriptions are at night.    Plan  Continue current medication management strategy   Follow up: 6 month phone visit

## 2020-02-01 DIAGNOSIS — R200 Anesthesia of skin: Secondary | ICD-10-CM | POA: Diagnosis not present

## 2020-02-03 NOTE — Unmapped (Signed)
[facility] Refill Coordination Note    Specialty Medication(s) to be Shipped:   Inflammatory Disorders: Stelara 90mg /ml  Other medication(s) to be shipped: No additional medications requested for fill at this time     Brenda Beard, DOB: 05-Sep-1965  Phone: [phone] (home)     All above HIPAA information was verified with patient.     Was a Nurse, learning disability used for this call? No    Completed refill call assessment today to schedule patient's medication shipment from the [facility] [phone]).       Specialty medication(s) and dose(s) confirmed: Regimen is correct and unchanged.   Changes to medications: Brenda Beard reports no changes at this time.  Changes to insurance: No  Questions for the pharmacist: No    Confirmed patient received Welcome Packet with first shipment. The patient will receive a drug information handout for each medication shipped and additional FDA Medication Guides as required.       DISEASE/MEDICATION-SPECIFIC INFORMATION        For patients on injectable medications: Patient currently has 0 doses left.  Next injection is scheduled for 02/13/2020.    SPECIALTY MEDICATION ADHERENCE     Medication Adherence    Patient reported X missed doses in the last month: 0  Specialty Medication: Stelara 90mg /ml  Patient is on additional specialty medications: No  Informant: patient  Reliability of informant: reliable        Stelara 90mg /ml : 0 days of medicine on hand     SHIPPING     Shipping address confirmed in Epic.     Delivery Scheduled: Yes, Expected medication delivery date: 02/10/2020.     Medication will be delivered via UPS to the prescription address in Epic WAM.    [name] P Wetzel Beard Shared [facility] Specialty Technician

## 2020-02-09 MED FILL — STELARA 90 MG/ML SUBCUTANEOUS SYRINGE: 56 days supply | Qty: 1 | Fill #1 | Status: AC

## 2020-02-09 MED FILL — STELARA 90 MG/ML SUBCUTANEOUS SYRINGE: SUBCUTANEOUS | 56 days supply | Qty: 1 | Fill #1

## 2020-02-17 ENCOUNTER — Ambulatory Visit (INDEPENDENT_AMBULATORY_CARE_PROVIDER_SITE_OTHER): Payer: Medicare HMO | Admitting: Family Medicine

## 2020-02-17 ENCOUNTER — Other Ambulatory Visit: Payer: Self-pay | Admitting: Family Medicine

## 2020-02-17 ENCOUNTER — Other Ambulatory Visit: Payer: Self-pay

## 2020-02-17 ENCOUNTER — Encounter: Payer: Self-pay | Admitting: Family Medicine

## 2020-02-17 VITALS — BP 130/60 | HR 60 | Temp 97.7°F | Resp 16 | Ht 65.0 in | Wt 162.8 lb

## 2020-02-17 DIAGNOSIS — E785 Hyperlipidemia, unspecified: Secondary | ICD-10-CM

## 2020-02-17 DIAGNOSIS — E782 Mixed hyperlipidemia: Secondary | ICD-10-CM | POA: Diagnosis not present

## 2020-02-17 DIAGNOSIS — M791 Myalgia, unspecified site: Secondary | ICD-10-CM

## 2020-02-17 DIAGNOSIS — Z23 Encounter for immunization: Secondary | ICD-10-CM | POA: Diagnosis not present

## 2020-02-17 DIAGNOSIS — E1169 Type 2 diabetes mellitus with other specified complication: Secondary | ICD-10-CM | POA: Diagnosis not present

## 2020-02-17 DIAGNOSIS — I10 Essential (primary) hypertension: Secondary | ICD-10-CM | POA: Diagnosis not present

## 2020-02-17 DIAGNOSIS — G609 Hereditary and idiopathic neuropathy, unspecified: Secondary | ICD-10-CM

## 2020-02-17 DIAGNOSIS — K50818 Crohn's disease of both small and large intestine with other complication: Secondary | ICD-10-CM

## 2020-02-17 DIAGNOSIS — Z6826 Body mass index (BMI) 26.0-26.9, adult: Secondary | ICD-10-CM | POA: Diagnosis not present

## 2020-02-17 MED ORDER — GABAPENTIN 300 MG PO CAPS
300.0000 mg | ORAL_CAPSULE | Freq: Three times a day (TID) | ORAL | 1 refills | Status: DC
Start: 2020-02-17 — End: 2020-11-21

## 2020-02-17 MED ORDER — GABAPENTIN 300 MG PO CAPS
900.0000 mg | ORAL_CAPSULE | Freq: Every day | ORAL | 1 refills | Status: DC
Start: 2020-02-17 — End: 2020-02-17

## 2020-02-17 MED ORDER — GABAPENTIN 300 MG PO CAPS
300.0000 mg | ORAL_CAPSULE | Freq: Three times a day (TID) | ORAL | 1 refills | Status: DC
Start: 2020-02-17 — End: 2020-02-17

## 2020-02-17 MED ORDER — GABAPENTIN 300 MG CAPSULE
Freq: Every evening | ORAL | 0 days
Start: 2020-02-17 — End: ?

## 2020-02-17 NOTE — Progress Notes (Signed)
Acute Office Visit  Subjective:    Patient ID: Brittany [name], female    DOB: 07-10-1965, 54 y.o.   MRN: [id number]  Chief Complaint  Patient presents with  . Diabetes  . Hyperlipidemia   Patient is here for routine follow-up.  She does have neuropathy likely from her back surgery.  She had a nerve conduction study which was actually normal although Dr. Terrial Rhodes did say that she may have a low level neuropathy.  Gabapentin helps but she is unable to take it other than it in the evenings.  She is asking if she could increase the dose prior to bed.  The left foot is worse than the right.  She also has cramps at night in her legs .  Patient has diabetes, hyperlipidemia, and crohn's disease. Eating healthy. House work and yard work.    Has Crohn's disease and takes Stelara.  She is scheduled for a colonoscopy in a couple weeks.  She is no longer seeing Dr. Ellender Hose as Dr. Ellender Hose has semiretired.  She has a new gastroenterologist.  Still at [facility].  She is currently taking fish oil for cholessterol  Diabetes: Eating healthy and exercising. No medicines. On gabapentin for neuropathy.   Past Medical History:  Diagnosis Date  . BMI 26.0-26.9,adult 07/06/2019  . Bradycardia 08/10/2014  . Crohn's disease ([place])   . DDD (degenerative disc disease), lumbar 07/17/2017  . Diabetes mellitus without complication ([place])   . Fatigue 08/10/2014  . Hyperlipidemia   . Hyperlipidemia associated with type 2 diabetes mellitus ([place]) 07/06/2019  . Hypertension   . Hypertension, essential   . Mixed hyperlipidemia   . Palpitations 07/06/2019  . Paresthesia 07/06/2019    Past Surgical History:  Procedure Laterality Date  . ABDOMINAL HYSTERECTOMY N/A 10/20/2012   Procedure: HYSTERECTOMY ABDOMINAL;  Surgeon: [name], MD;  Location: [facility];  Service: Gynecology;  Laterality: N/A;  . ANAL FISSURE REPAIR  2002  . APPENDECTOMY    . SALPINGOOPHORECTOMY Bilateral 10/20/2012   Procedure: SALPINGO OOPHORECTOMY;   Surgeon: [name], MD;  Location: [facility];  Service: Gynecology;  Laterality: Bilateral;  . SMALL INTESTINE SURGERY     reconstruction  . TONSILLECTOMY      Family History  Problem Relation Age of Onset  . Heart Problems Mother   . Diabetes Mother   . Diabetes Father   . Heart Problems Father   . Cancer Maternal Grandmother        lung  . Peptic Ulcer Disease Other     Social History   Socioeconomic History  . Marital status: Widowed    Spouse name: Not on file  . Number of children: 1  . Years of education: Not on file  . Highest education level: Not on file  Occupational History  . Not on file  Tobacco Use  . Smoking status: Former Smoker    Quit date: 2002    Years since quitting: 19.7  . Smokeless tobacco: Never Used  Vaping Use  . Vaping Use: Never used  Substance and Sexual Activity  . Alcohol use: No  . Drug use: No  . Sexual activity: Not on file  Other Topics Concern  . Not on file  Social History Narrative   wears sunscreen, brushes and flosses daily, see's dentist bi-annually, has smoke/carbon monoxide detectors, wears a seatbelt and practices gun safety   Social Determinants of Health   Financial Resource Strain:   . Difficulty of Paying Living Expenses: Not on  file  Food Insecurity:   . Worried About Charity fundraiser in the Last Year: Not on file  . Ran Out of Food in the Last Year: Not on file  Transportation Needs: No Transportation Needs  . Lack of Transportation (Medical): No  . Lack of Transportation (Non-Medical): No  Physical Activity:   . Days of Exercise per Week: Not on file  . Minutes of Exercise per Session: Not on file  Stress:   . Feeling of Stress : Not on file  Social Connections:   . Frequency of Communication with Friends and Family: Not on file  . Frequency of Social Gatherings with Friends and Family: Not on file  . Attends Religious Services: Not on file  . Active Member of Clubs or Organizations: Not on file  .  Attends Archivist Meetings: Not on file  . Marital Status: Not on file  Intimate Partner Violence:   . Fear of Current or Ex-Partner: Not on file  . Emotionally Abused: Not on file  . Physically Abused: Not on file  . Sexually Abused: Not on file    Outpatient Medications Prior to Visit  Medication Sig Dispense Refill  . ALPRAZolam (XANAX) 0.5 MG tablet TAKE 1 TABLET BY MOUTH TWICE A DAY 60 tablet 2  . Biotin 1000 MCG tablet Take 1,000 mcg by mouth daily.    . calcium-vitamin D (OSCAL WITH D) 500-200 MG-UNIT per tablet Take 1 tablet by mouth daily.    . cholecalciferol (VITAMIN D3) 25 MCG (1000 UNIT) tablet Take 1,000 Units by mouth daily.    Marland Kitchen estradiol (ESTRACE) 1 MG tablet TAKE 1 TABLET EVERY DAY 90 tablet 3  . fish oil-omega-3 fatty acids 1000 MG capsule Take 1 g by mouth daily.    . Glucosamine-Chondroit-Vit C-Mn (GLUCOSAMINE 1500 COMPLEX PO) Glucosamine    . Melatonin 10 MG CAPS Take 10 mg by mouth at bedtime.     . Misc Natural Products (TART CHERRY ADVANCED PO) Take by mouth daily.    . TURMERIC PO Take by mouth in the morning and at bedtime.    . ustekinumab (STELARA) 90 MG/ML SOSY injection Inject the contents of 1 syringe (90mg ) under the skin every 8 weeks.    . gabapentin (NEURONTIN) 300 MG capsule Take 600 mg by mouth at bedtime.     No facility-administered medications prior to visit.    Allergies  Allergen Reactions  . Mercaptopurine     Used to treat pt. Crohn's Disease.  Caused Pancreatis 1992.  . Amitriptyline   . Metoprolol Other (See Comments)    Extreme fatigue Extreme fatigue   . Trazodone And Nefazodone   . Zocor [Simvastatin]   . Morphine And Related Itching  . Penicillins Rash    Review of Systems  Constitutional: Positive for fatigue. Negative for chills and fever.  HENT: Negative for congestion, ear pain, rhinorrhea and sore throat.   Respiratory: Negative for cough and shortness of breath.   Cardiovascular: Negative for chest pain  and palpitations.  Gastrointestinal: Negative for abdominal pain, constipation, diarrhea, nausea and vomiting.  Genitourinary: Negative for dysuria and urgency.  Musculoskeletal: Positive for arthralgias, back pain and myalgias (tingling sensation in both feet L>R , cramps in leg ).  Neurological: Negative for dizziness, weakness, light-headedness and headaches.  Psychiatric/Behavioral: Negative for dysphoric mood. The patient is not nervous/anxious.        Objective:    Physical Exam Vitals reviewed.  Constitutional:      Appearance:  Normal appearance.  Neck:     Vascular: No carotid bruit.  Cardiovascular:     Rate and Rhythm: Normal rate and regular rhythm.     Heart sounds: Murmur heard.   Pulmonary:     Effort: Pulmonary effort is normal.     Breath sounds: Normal breath sounds.  Abdominal:     General: Bowel sounds are normal.  Musculoskeletal:     Cervical back: Normal range of motion.  Neurological:     Mental Status: She is alert and oriented to person, place, and time.  Psychiatric:        Mood and Affect: Mood normal.        Behavior: Behavior normal.    Diabetic Foot Exam - Simple   Simple Foot Form Visual Inspection No deformities, no ulcerations, no other skin breakdown bilaterally: Yes Sensation Testing See comments: Yes Pulse Check Posterior Tibialis and Dorsalis pulse intact bilaterally: Yes Comments Decreased sensation. Mild.     BP 130/60   Pulse 60   Temp 97.7 F (36.5 C)   Resp 16   Ht 5\' 5"  (1.651 m)   Wt 162 lb 12.8 oz (73.8 kg)   LMP 09/23/2012   BMI 27.09 kg/m  Wt Readings from Last 3 Encounters:  02/17/20 162 lb 12.8 oz (73.8 kg)  10/26/19 168 lb 3.2 oz (76.3 kg)  07/15/19 163 lb 3.2 oz (74 kg)    Health Maintenance Due  Topic Date Due  . Hepatitis C Screening  Never done  . COVID-19 Vaccine (1) Never done  . HIV Screening  Never done  . TETANUS/TDAP  Never done  . PAP SMEAR-Modifier  Never done  . MAMMOGRAM  Never done   . COLONOSCOPY  Never done  . OPHTHALMOLOGY EXAM  06/18/2019    There are no preventive care reminders to display for this patient.   Lab Results  Component Value Date   TSH 1.600 10/26/2019   Lab Results  Component Value Date   WBC 5.8 10/26/2019   HGB 14.3 10/26/2019   HCT 43.5 10/26/2019   MCV 92 10/26/2019   PLT 223 10/26/2019   Lab Results  Component Value Date   NA 142 10/26/2019   K 4.8 10/26/2019   CO2 26 10/26/2019   GLUCOSE 57 (L) 10/26/2019   BUN 17 10/26/2019   CREATININE 0.86 10/26/2019   BILITOT 0.5 10/26/2019   ALKPHOS 66 10/26/2019   AST 15 10/26/2019   ALT 11 10/26/2019   PROT 6.8 10/26/2019   ALBUMIN 4.3 10/26/2019   CALCIUM 9.6 10/26/2019   Lab Results  Component Value Date   CHOL 228 (H) 07/06/2019   Lab Results  Component Value Date   HDL 74 07/06/2019   Lab Results  Component Value Date   LDLCALC 132 (H) 07/06/2019   Lab Results  Component Value Date   TRIG 126 07/06/2019   Lab Results  Component Value Date   CHOLHDL 3.1 07/06/2019   Lab Results  Component Value Date   HGBA1C 5.4 10/26/2019       Assessment & Plan:  1. Hyperlipidemia associated with type 2 diabetes mellitus ([place]) Control:great Recommend check feet daily. Recommend annual eye exams. Medicines: none Continue to work on eating a healthy diet and exercise.  Labs drawn today.   - Hemoglobin A1c  2. Hypertension, essential Well controlled.  Diet controlled Continue to work on eating a healthy diet and exercise.  Labs drawn today.  - CBC with Differential/Platelet - Comprehensive metabolic panel  3. Mixed hyperlipidemia Well controlled.  No changes to medicines.  Continue to work on eating a healthy diet and exercise.  Labs drawn today.  - Lipid panel  4. Crohn's disease of both small and large intestine with other complication ([place]) Mgmt per gi. The current medical regimen is effective;  continue present plan and medications.  5. BMI  26.0-26.9,adult  6. Idiopathic neuropathy Increase gabapentin to 300 mg 3 at night. Pt to wean off xanax.  - Methylmalonic acid(mma), rnd urine  7. Myalgia - CK - Magnesium - Phosphorus  8. Encounter for immunization - Flu Vaccine MDCK QUAD PF   Orders Placed This Encounter  Procedures  . Flu Vaccine MDCK QUAD PF  . CBC with Differential/Platelet  . Comprehensive metabolic panel  . Lipid panel  . Hemoglobin A1c  . CK  . Magnesium  . Phosphorus  . Methylmalonic acid, serum     Follow-up: Return in about 6 months (around 08/17/2020) for fasting visit with Dr. Alben Jepsen.  An After Visit Summary was printed and given to the patient.  [name] [facility] [phone]

## 2020-02-18 LAB — COMPREHENSIVE METABOLIC PANEL
ALT: 17 IU/L (ref 0–32)
AST: 18 IU/L (ref 0–40)
Albumin/Globulin Ratio: 1.9 (ref 1.2–2.2)
Albumin: 4.5 g/dL (ref 3.8–4.9)
Alkaline Phosphatase: 61 IU/L (ref 44–121)
BUN/Creatinine Ratio: 21 (ref 9–23)
BUN: 14 mg/dL (ref 6–24)
Bilirubin Total: 0.6 mg/dL (ref 0.0–1.2)
CO2: 26 mmol/L (ref 20–29)
Calcium: 9.4 mg/dL (ref 8.7–10.2)
Chloride: 104 mmol/L (ref 96–106)
Creatinine, Ser: 0.67 mg/dL (ref 0.57–1.00)
GFR calc Af Amer: 114 mL/min/{1.73_m2} (ref >59)
GFR calc non Af Amer: 99 mL/min/{1.73_m2} (ref >59)
Globulin, Total: 2.4 g/dL (ref 1.5–4.5)
Glucose: 90 mg/dL (ref 65–99)
Potassium: 4.2 mmol/L (ref 3.5–5.2)
Sodium: 142 mmol/L (ref 134–144)
Total Protein: 6.9 g/dL (ref 6.0–8.5)

## 2020-02-18 LAB — CARDIOVASCULAR RISK ASSESSMENT

## 2020-02-18 LAB — HEMOGLOBIN A1C
Est. average glucose Bld gHb Est-mCnc: 105 mg/dL
Hgb A1c MFr Bld: 5.3 % (ref 4.8–5.6)

## 2020-02-18 LAB — CBC WITH DIFFERENTIAL/PLATELET
Basophils Absolute: 0 10*3/uL (ref 0.0–0.2)
Basos: 1 %
EOS (ABSOLUTE): 0.1 10*3/uL (ref 0.0–0.4)
Eos: 2 %
Hematocrit: 42.4 % (ref 34.0–46.6)
Hemoglobin: 13.8 g/dL (ref 11.1–15.9)
Immature Grans (Abs): 0 10*3/uL (ref 0.0–0.1)
Immature Granulocytes: 0 %
Lymphocytes Absolute: 1.8 10*3/uL (ref 0.7–3.1)
Lymphs: 34 %
MCH: 29.6 pg (ref 26.6–33.0)
MCHC: 32.5 g/dL (ref 31.5–35.7)
MCV: 91 fL (ref 79–97)
Monocytes Absolute: 0.4 10*3/uL (ref 0.1–0.9)
Monocytes: 8 %
Neutrophils Absolute: 3 10*3/uL (ref 1.4–7.0)
Neutrophils: 55 %
Platelets: 225 10*3/uL (ref 150–450)
RBC: 4.66 x10E6/uL (ref 3.77–5.28)
RDW: 13.3 % (ref 11.7–15.4)
WBC: 5.4 10*3/uL (ref 3.4–10.8)

## 2020-02-18 LAB — CK: Total CK: 59 U/L (ref 32–182)

## 2020-02-18 LAB — MAGNESIUM: Magnesium: 2.2 mg/dL (ref 1.6–2.3)

## 2020-02-18 LAB — LIPID PANEL
Chol/HDL Ratio: 2.8 ratio (ref 0.0–4.4)
Cholesterol, Total: 251 mg/dL — ABNORMAL HIGH (ref 100–199)
HDL: 91 mg/dL (ref >39)
LDL Chol Calc (NIH): 143 mg/dL — ABNORMAL HIGH (ref 0–99)
Triglycerides: 103 mg/dL (ref 0–149)
VLDL Cholesterol Cal: 17 mg/dL (ref 5–40)

## 2020-02-18 LAB — PHOSPHORUS: Phosphorus: 3 mg/dL (ref 3.0–4.3)

## 2020-02-22 LAB — METHYLMALONIC ACID, SERUM: Methylmalonic Acid: 97 nmol/L (ref 0–378)

## 2020-02-28 DIAGNOSIS — K50919 Crohn's disease, unspecified, with unspecified complications: Principal | ICD-10-CM

## 2020-03-02 ENCOUNTER — Telehealth: Payer: Self-pay

## 2020-03-02 NOTE — Telephone Encounter (Signed)
Brittany [name] called to report that she continues to have left leg and foot cramps. She is complaining of not being able to sleep because of the cramps.  She has been careful to stay hydrated.

## 2020-03-02 NOTE — Telephone Encounter (Signed)
Try hylands cramps (drops.) kc

## 2020-03-06 DIAGNOSIS — H5201 Hypermetropia, right eye: Secondary | ICD-10-CM | POA: Diagnosis not present

## 2020-03-06 DIAGNOSIS — Z0100 Encounter for examination of eyes and vision without abnormal findings: Secondary | ICD-10-CM | POA: Diagnosis not present

## 2020-03-08 ENCOUNTER — Telehealth: Payer: Self-pay

## 2020-03-08 DIAGNOSIS — L720 Epidermal cyst: Secondary | ICD-10-CM | POA: Diagnosis not present

## 2020-03-08 DIAGNOSIS — L820 Inflamed seborrheic keratosis: Secondary | ICD-10-CM | POA: Diagnosis not present

## 2020-03-08 DIAGNOSIS — L821 Other seborrheic keratosis: Secondary | ICD-10-CM | POA: Diagnosis not present

## 2020-03-08 DIAGNOSIS — L728 Other follicular cysts of the skin and subcutaneous tissue: Secondary | ICD-10-CM | POA: Diagnosis not present

## 2020-03-08 NOTE — Chronic Care Management (AMB) (Signed)
Chronic Care Management Pharmacy Assistant   Name: Brittany [name]  MRN: [id number] DOB: 04/02/1966  Reason for Encounter: Disease State - Hypertension and Lipid Adherence Call.   Patient Questions:   1.  Have you seen any other providers since your last visit? Yes  02/17/2020 Brittany Brome, MD -  Follow up in 6 months    2.  Any changes in your medicines or health? Yes. Increase Gabapentin to 300 mg three (3) tablets at night - wean off xanax / Try Hylands Cramp Drops -  Per Dr. Tobie Poet   PCP : Brittany Brome, MD  Allergies:   Allergies  Allergen Reactions  . Mercaptopurine     Used to treat pt. Crohn's Disease.  Caused Pancreatis 1992.  . Amitriptyline   . Metoprolol Other (See Comments)    Extreme fatigue Extreme fatigue   . Trazodone And Nefazodone   . Zocor [Simvastatin]   . Morphine And Related Itching  . Penicillins Rash    Medications: Outpatient Encounter Medications as of 03/08/2020  Medication Sig  . ALPRAZolam (XANAX) 0.5 MG tablet TAKE 1 TABLET BY MOUTH TWICE A DAY  . Biotin 1000 MCG tablet Take 1,000 mcg by mouth daily.  . calcium-vitamin D (OSCAL WITH D) 500-200 MG-UNIT per tablet Take 1 tablet by mouth daily.  . cholecalciferol (VITAMIN D3) 25 MCG (1000 UNIT) tablet Take 1,000 Units by mouth daily.  Marland Kitchen estradiol (ESTRACE) 1 MG tablet TAKE 1 TABLET EVERY DAY  . fish oil-omega-3 fatty acids 1000 MG capsule Take 1 g by mouth daily.  Marland Kitchen gabapentin (NEURONTIN) 300 MG capsule Take 1 capsule (300 mg total) by mouth 3 (three) times daily.  . Glucosamine-Chondroit-Vit C-Mn (GLUCOSAMINE 1500 COMPLEX PO) Glucosamine  . Melatonin 10 MG CAPS Take 10 mg by mouth at bedtime.   . Misc Natural Products (TART CHERRY ADVANCED PO) Take by mouth daily.  . TURMERIC PO Take by mouth in the morning and at bedtime.  . ustekinumab (STELARA) 90 MG/ML SOSY injection Inject the contents of 1 syringe (26m) under the skin every 8 weeks.   No facility-administered encounter medications  on file as of 03/08/2020.    Current Diagnosis: Patient Active Problem List   Diagnosis Date Noted  . Myalgia 10/26/2019  . Paresthesia 07/06/2019  . Hyperlipidemia associated with type 2 diabetes mellitus ([place] 07/06/2019  . BMI 26.0-26.9,adult 07/06/2019  . Palpitations 07/06/2019  . Hypertension, essential   . Mixed hyperlipidemia   . DDD (degenerative disc disease), lumbar 07/17/2017  . Other fatigue 08/10/2014  . Bradycardia 08/10/2014  . [place]hn's disease ([place] 02/24/2013   Recent Relevant Labs: Lab Results  Component Value Date/Time   HGBA1C 5.3 02/17/2020 08:25 AM   HGBA1C 5.4 10/26/2019 01:51 PM   MICROALBUR 80 07/06/2019 09:07 AM    Kidney Function Lab Results  Component Value Date/Time   CREATININE 0.67 02/17/2020 08:25 AM   CREATININE 0.86 10/26/2019 01:49 PM   GFRNONAA 99 02/17/2020 08:25 AM   GFRAA 114 02/17/2020 08:25 AM   Reviewed chart prior to disease state call. Spoke with patient regarding BP  Recent Office Vitals: BP Readings from Last 3 Encounters:  02/17/20 130/60  10/26/19 124/60  07/15/19 (!) 150/68   Pulse Readings from Last 3 Encounters:  02/17/20 60  10/26/19 60  07/15/19 66    Wt Readings from Last 3 Encounters:  02/17/20 162 lb 12.8 oz (73.8 kg)  10/26/19 168 lb 3.2 oz (76.3 kg)  07/15/19 163 lb 3.2 oz (74  kg)     Kidney Function Lab Results  Component Value Date/Time   CREATININE 0.67 02/17/2020 08:25 AM   CREATININE 0.86 10/26/2019 01:49 PM   GFRNONAA 99 02/17/2020 08:25 AM   GFRAA 114 02/17/2020 08:25 AM    BMP Latest Ref Rng & Units 02/17/2020 10/26/2019 07/06/2019  Glucose 65 - 99 mg/dL 90 57(L) 87  BUN 6 - 24 mg/dL _0 Creatinine 0.57 - 1.00 mg/dL 0.67 0.86 0.71  BUN/Creat Ratio 9 - _1 Sodium 134 - 144 mmol/L 142 142 142  Potassium 3.5 - 5.2 mmol/L 4.2 4.8 4.6  Chloride 96 - 106 mmol/L 104 103 103  CO2 20 - 29 mmol/L 26 26 -  Calcium 8.7 - 10.2 mg/dL 9.4 9.6 9.7    . Current antihypertensive  regimen:  o None . How often are you checking your Blood Pressure? Patient states she does not take blood pressure at home.  . Current home BP readings: none  . What recent interventions/DTPs have been made by any provider to improve Blood Pressure control since last CPP Visit:  Patient states she takes all medications as directed by providers.  . Any recent hospitalizations or ED visits since last visit with CPP? No   . What diet changes have been made to improve Blood Pressure Control?  o Patient states she eats healthy , she does not eat a lot of greens because of crohn's.  . What ,exercise is being done to improve your Blood Pressure Control?  o Patient states she is very active.  Adherence Review: Is the patient currently on ACE/ARB medication? No Does the patient have >5 day gap between last estimated fill dates? No   .  Comprehensive medication review performed; Spoke to patient regarding cholesterol  Lipid Panel    Component Value Date/Time   CHOL 251 (H) 02/17/2020 0825   TRIG 103 02/17/2020 0825   HDL 91 02/17/2020 0825   LDLCALC 143 (H) 02/17/2020 0825    10-year ASCVD risk score: The 10-year ASCVD risk score Mikey Bussing DC Brooke Bonito., et al., 2013) is: 3.5%   Values used to calculate the score:     Age: 54 years     Sex: Female     Is Non-Hispanic African American: No     Diabetic: Yes - patient not a diabetic see labs     Tobacco smoker: No     Systolic Blood Pressure: 361 mmHg     Is BP treated: No     HDL Cholesterol: 91 mg/dL     Total Cholesterol: 251 mg/dL  . Current antihyperlipidemic regimen:  o Fish Oil 1000 mg  daily  . Previous antihyperlipidemic medications tried: None.  ASCVD risk enhancing conditions:   HTN  . What recent interventions/DTPs have been made by any provider to improve Cholesterol control since last CPP Visit: Patient states she does take her medication as directed .  Marland Kitchen Any recent hospitalizations or ED visits since last visit with CPP?  No  . What diet changes have been made to improve Cholesterol?  o Patient states she is eating healthy . What exercise is being done to improve Cholesterol?  o Patient is very active.  Adherence Review: Does the patient have >5 day gap between last estimated fill dates? No     Goals Addressed            This Visit's Progress   . Pharmacy Care Plan - HTN   On track  CARE PLAN ENTRY (see longitudinal plan of care for additional care plan information)  Current Barriers:  . Chronic disease management support, education and care coordination needs related to hypertension, hyperlipidemia.  . Current antihypertensive regimen: diet/lifestyle . Previous antihypertensives tried: lisinopril . Last practice recorded BP readings:  BP Readings from Last 3 Encounters:  10/26/19 124/60  07/15/19 (!) 150/68  07/06/19 110/74   . Current home BP readings: not checking . Most recent eGFR/CrCl: No results found for: EGFR  No components found for: CRCL  Pharmacist Clinical Goal(s):  Marland Kitchen Over the next 90 days, patient will work with PharmD and providers to maintain bp less than 140/90 mmHg  Interventions: . Inter-disciplinary care team collaboration (see longitudinal plan of care) . Comprehensive medication review performed; medication list updated in the electronic medical record.  . Discussed benefits of exercise and diet to reduce bp.   Patient Self Care Activities:  . Patient will continue to check BP as needed, document, and provide at future appointments  Initial goal documentation and Please see past updates related to this goal by clicking on the "Past Updates" button in the selected goal         Follow-Up:  Pharmacist Review   Clarise Cruz B. Owens Shark, [place] notified.  Judithann Sheen, [facility] Clinical Pharmacist Assistant [phone]

## 2020-03-21 ENCOUNTER — Ambulatory Visit: Admit: 2020-03-21 | Discharge: 2020-03-22 | Payer: MEDICARE | Attending: Internal Medicine | Primary: Internal Medicine

## 2020-03-21 DIAGNOSIS — Z23 Encounter for immunization: Secondary | ICD-10-CM | POA: Diagnosis not present

## 2020-03-21 DIAGNOSIS — Z79899 Other long term (current) drug therapy: Secondary | ICD-10-CM | POA: Diagnosis not present

## 2020-03-21 DIAGNOSIS — K5080 Crohn's disease of both small and large intestine without complications: Secondary | ICD-10-CM | POA: Diagnosis not present

## 2020-03-21 MED ORDER — SHINGRIX (PF) 50 MCG/0.5 ML INTRAMUSCULAR SUSPENSION, KIT
Freq: Once | INTRAMUSCULAR | 1 refills | 1 days | Status: CP
Start: 2020-03-21 — End: 2020-03-21

## 2020-03-21 NOTE — Unmapped (Signed)
[facility]  CONSULT NOTE - INFLAMMATORY BOWEL DISEASE  03/21/2020    Demographics:  Brenda Beard is a 54 y.o. year old female    Referring physician:   Jules Husbands, MD  [name]  [name]  [place],  [place] [address]          HPI / NOTE :     Today, I saw Brenda Beard for follow up consultation in the [facility] at the request of Dr. Sedalia Muta regarding Crohn's Disease.  Prior records including available clinical notes, endoscopy reports, imaging results and pathology results were reviewed in detail as part of this follow up consultation.     CC: doing better     HPI:  Brenda Beard is a 54 year old woman with long-standing ileocolonic Crohn's disease as well as some rectal issues. She was on Remicade for a prolonged period of time and did quite well. Due to insurance reasons she ended up having to come off of the Remicade and was eventually transitioned to ustekinumab as part of the ustekinumab trial. She did very well on the ustekinumab and at the end of the trial she was transitioned to commercial ustekinumab which she has been maintained on for several years at this point.     She was last seen in July 2021.  At that time, she was having increased bowel movements, about 3-5 loose BMs per day with crampy, RLQ abdominal pain. Colonoscopy was ordered,   Abdominal pain and diarrhea resolved spontaneously after several weeks. She attributes abnormal bowel habits to increased stress in her life at that time.    Currently, she has 1 soft BM per day. Occasionally feels more constipated requiring her to sit on the toilet for several minutes before stool passes. Improves if she drinks plenty of water during the day. No pain or bleeding with BMs. Overall, does not feel that this is similar to issues when she had anal stenosis previously.    Review of Systems: positive as per HPI   Otherwise, the balance of 10 systems is negative.          IBD HISTORY:     Year of disease onset:  1989  Diagnosis:  Crohn's Disease  Age at onset:   33-40 yr old (A2)  Location:  Ileocolonic (L3)  Behavior:  Penetrating (B3)  - per Dr. Erma Heritage notes, I do not have access to these earlier records   Perianal Dz:  Yes    Brief IBD Disease Course:    1989 diagnosis ileal Crohn's disease. Ultimately progressed to ileocolonic disease with perineal involvement with recurrent perirectal abscesses. 2000 ileocolic resection. Prior treatment with 6-MP resulting in pancreatitis, Cellcept. 2002 start Remicade which she remained on for approximately 10 years, she did have arthralgias on Remicade. 09/2011 insurance issues no longer able to get Remicade. 02/2012 enrolled in ustekinumab trial from 2014 - 2018. 06/2016 rectal abscess which spontaneously drained. 03/2017 colon with endoscopic remission. 2019 commercial ustekinumab started. 02/2019 ?perirectal abscess that spontaneous drained, treated with cipro. 04/2019, perirectal abscess treated with cipro/flagyl.    *Reports prior history of neuropathy with flagyl - more tolerable with reduced dose  *History of hysterectomy      Endoscopy:      Colon 03/13/17: Patent ileocolonic anastomosis. Diverticulosis in sigmoid colon and descending colon. Nodule in descending colon. Biopsied. Excess tissue in rectum raising question of rectal prolapse.    Colon 03/17/13: Anal canal stenosis. Patent ileocolonic anastomosis. Diverticulosis in  left colon. 4 mm polyp in descending colon. Otherwise normal.    Colon 04/30/12: Anal canal stenosis, dilated to 18 mm with Hegar dilator. Aphthous ulceration in TI. Marland Kitchen Moderately congested mucosa in rectosigmoid. Patent ileocolonic anastomosis in ascending colon.     Colon 03/05/12: Anal stricture. Diverticulosis in left colon. Erythematous mucosa in the rectum. Aphthae in rectum. Patent anastomosis.     Colon 08/24/09: Nodule in rectum, biopsied. Diverticulosis left colon, transverse colon. Patent ileocolonic anastomosis.     Colon 03/29/05: Ileocolonic anastomosis in ascending colon. Otherwise normal colon.     Imaging:    Colon 02/28/03: Normal TI. Ileocolonic anastomosis in ascending colon. Normal colon.    Colon 10/30/00: Featureless mucosa in rectum. Ulceration at the ileocolonic anastomosis that was superficial and circumferential. Neoterminal ileum with a few scattered aphthous ulcerations.     Prior IBD medications (type, dose, duration, response):  - 6-MP: pancreatitis  - Remicade: treated for many years until insurance stopped covering, did have arthralgias on this therapy as well  - Cellcept: details not known  - Stelara: [facility] clinical trial as well as commercial product           Past Medical History:   Past medical history:   Past Medical History:   Diagnosis Date   ??? Constipation    ??? Crohn's disease (CMS-HCC)    ??? DDD (degenerative disc disease), lumbar    ??? Diabetes mellitus (CMS-HCC)     Borderline, doesn't take meds and doesn't check sugars.  Diet managed.   ??? H/O hypotension     States occurred post hysterectomy and colon surgery.   ??? Heart murmur    ??? High cholesterol    ??? Hypertension    ??? Incomplete defecation     intermittent   ??? Joint pain    ??? Rectocele    ??? Straining during bowel movements      Past surgical history:   Past Surgical History:   Procedure Laterality Date   ??? ANAL FISSURECTOMY  2007    [place]   ??? APPENDECTOMY      Childhood   ??? BACK SURGERY     ??? COLON SURGERY  1992 and 1999    small intestine-total 20 inches removed   ??? EYE SURGERY  2017    cataract surgery    ??? HEMORRHOID SURGERY  1992   ??? HYSTERECTOMY      Complete   ??? PR ANAL PRESSURE RECORD N/A 05/01/2018    Procedure: ANORECTAL MANOMETRY;  Surgeon: Nurse-Based Giproc;  [facility]: [facility];  Service: Gastroenterology   ??? PR COLONOSCOPY W/BIOPSY SINGLE/MULTIPLE  03/17/2013    Procedure: COLONOSCOPY, FLEXIBLE, PROXIMAL TO SPLENIC FLEXURE; WITH BIOPSY, SINGLE OR MULTIPLE;  Surgeon: Teodoro Spray, MD;  Location: GI PROCEDURES [facility];  Service: Gastroenterology   ??? PR COLONOSCOPY W/BIOPSY SINGLE/MULTIPLE N/A 03/13/2017    Procedure: COLONOSCOPY, FLEXIBLE, PROXIMAL TO SPLENIC FLEXURE; WITH BIOPSY, SINGLE OR MULTIPLE;  Surgeon: [name], MD;  [facility];  Service: Gastroenterology   ??? PR LAMINEC/FACETECT/FORAMIN,LUMBAR 1 SEG Bilateral 05/31/2015    Procedure: L4-5 HEMILAMINECTOMY, DISCECTOMY;  Surgeon: Malachy Chamber, MD;  Location: OR [facility];  Service: Orthopedics   ??? TONSILLECTOMY       Family history:   Family History   Problem Relation Age of Onset   ??? Hypertension Mother    ??? Diabetes Mother    ??? Heart disease Mother  CHF   ??? Atrial fibrillation Mother    ??? Atrial fibrillation Father    ??? Diabetes Father      Social history: Widowed, husband died several years ago. Lives in [place], [place] but also has condo at [place]  (near [employer]  Social History     Socioeconomic History   ??? Marital status: Widowed     Spouse name: None   ??? Number of children: None   ??? Years of education: None   ??? Highest education level: None   Occupational History   ??? None   Tobacco Use   ??? Smoking status: Former Smoker     Packs/day: 1.00     Years: 12.00     Pack years: 12.00     Quit date: 03/17/2001     Years since quitting: 19.0   ??? Smokeless tobacco: Never Used   Substance and Sexual Activity   ??? Alcohol use: Yes     Comment: maybe once a month   ??? Drug use: No   ??? Sexual activity: None   Other Topics Concern   ??? None   Social History Narrative   ??? None     Social Determinants of Health     Financial Resource Strain: Not on file   Food Insecurity: Not on file   Transportation Needs: Not on file   Physical Activity: Not on file   Stress: Not on file   Social Connections: Not on file             Allergies:     Allergies   Allergen Reactions   ??? Mercaptopurine Other (See Comments)     Other reaction(s): Pancreatitis.  Used to treat pt. Crohn's Disease.  Caused Pancreatis 1992.       Other reaction(s): Other (See Comments)  Used to treat pt. Crohn's Disease.  Caused Pancreatis 1992.   ??? Amitriptyline    ??? Metoprolol Other (See Comments)     Extreme fatigue   ??? Simvastatin    ??? Trazodone Hcl    ??? Morphine Itching     States can take short term.   ??? Penicillins Rash                   Medications:     Current Outpatient Medications   Medication Sig Dispense Refill   ??? biotin 1 mg tablet Take 1,000 mcg by mouth every morning before breakfast.     ??? estradiol (ESTRACE) 1 MG tablet Take 1 mg by mouth nightly.      ??? FUROSEMIDE ORAL Take 10 mg by mouth daily as needed.     ??? gabapentin (NEURONTIN) 300 MG capsule Take 900 mg by mouth nightly.     ??? glucosamine-chondroitin 500-400 mg tablet Take 2 tablets by mouth nightly.      ??? hyoscyamine (LEVSIN/SL) 0.125 mg SL tablet Place 1 tablet (0.125 mg total) under the tongue every four (4) hours as needed for cramping. 30 tablet 1   ??? omega-3 fatty acids-fish oil (FISH OIL) 300-1,000 mg cap Take 1 capsule by mouth daily. Last taken 05/26/15     ??? polyethylene glycol (CLEARLAX) 17 gram/dose powder Take as directed per instruction sheet from [facility] Provider. 238 g 0   ??? polyethylene glycol (CLEARLAX) 17 gram/dose powder Take as directed per instruction sheet from [facility] Provider. 119 g 0   ??? simethicone (MYLICON) 125 MG chewable tablet Take as directed per instruction sheet from [facility] Provider. 4 tablet 0   ???  UNABLE TO FIND Take 1,000 mg by mouth two (2) times a day. Med Name: tumeric     ??? UNABLE TO FIND Take 1 capsule by mouth two (2) times a day. Med Name: cherry extract     ??? ustekinumab (STELARA) 90 mg/mL Syrg syringe Inject the contents of 1 syringe (90 mg total) under the skin every 8 weeks. 1 mL 5     No current facility-administered medications for this visit.             Physical Exam:   BP 149/66  - Pulse (!) 45  - Temp 36.8 ??C  - Wt 73.2 kg (161 lb 6.4 oz)  - BMI 26.05 kg/m??     GEN: no apparent distress, appears comfortable on exam  HEENT:EOMI, no scleral icterus   NEURO:  gait normal, non-focal, no obvious neurologic abnormality  LUNGS: normal work of breathing, acyanotic  CV: WWP, no edema   ABD: soft, nondistended, nontender   Extremities: no cyanosis, clubbing or edema, normal gait  Psych: affect appropriate, A&O x3  SKIN: no visible lesions on face, neck, arms, abdomen          Labs, Data & Indices:     Lab Review:   Lab Results   Component Value Date    WBC 7.0 02/23/2019    WBC 11.9 (H) 08/21/2010    RBC 4.76 02/23/2019    RBC 4.48 08/21/2010    HGB 13.8 02/23/2019    HGB 13.2 08/21/2010     Lab Results   Component Value Date    AST 18 02/23/2019    AST 19 08/21/2010    ALT 13 02/23/2019    ALT 29 08/21/2010    BUN 15 02/23/2019    Creatinine 0.72 02/23/2019    CO2 26.0 02/23/2019    Albumin 4.2 02/23/2019    Calcium 9.6 02/23/2019     No results found for: TSH     ............................................................................................................................................Marland Kitchen          Assessment & Recommendations:     Velvie Thomaston is a 54 y.o. female with history of ileocolonic Crohn's disease with perineal involvement s/p ileocolonic resection in 2000. She was previously treated with infliximab which was stopped due to insurance issues and has been maintained on ustekinumab since 2014. Her last endoscopy in 2018 confirmed that her Crohn's was in remission. She had a perirectal abscess that resolved with ciprofloxacin and reduced dose metronidazole. She has been off antibiotics since January without symptom recurrence. At last visit was complaining of increased frequency of BMs and we had set up a colonoscopy - her loose stools resolved spontaneously, coinciding with reduced psychosocial stressors (recently out of bad relationship). Currently, bowels are back to baseline so she canceled colonoscopy. No perianal symptoms at this time. She continues to tolerate the Stelara well without any side effects so will continue at current dosing.     We also discussed the importance of prevention today. She has already received annual influenza vaccine, as well as pneumonia vaccines. She has received the COVID vaccine. She is eligible for the booster, but has declined at this time. Given her age and immunosuppression, Shingrix vaccine series would also be recommended.    PLAN:  1. Crohn's disease  - continue Stelara every 8 weeks  - labs updated 02/2020 and reviewed in CareEverywhere    2. Perineal disease, improved  - previously followed with Dr. Epifania Gore so will need to establish with new surgeon for any future needs d/t  his retirement    3. High risk medication monitoring (Stelara)  - Quant gold and HBV serologies updated 02/2019  - discussed need for at least annual monitoring labs, currently up to date (02/2020)    4. Prevention  - influenza vaccine received 02/2020  - received Prevnar and pneumovax 2019 with PCP per pt  - recommend Shingrix vaccine series - prescription provided for her to have this done as an outpatient  - recommended COVID booster - patient declined at this time  - pap: h/o hysterectomy, need to discuss at next visit whether still has cervix in place  - CRC surveillance - due in 2023 or sooner if worsening symptoms    5. Follow up in 6 months or sooner as needed  --------------------------------------------  Nita Sickle, MD  [facility]  Multidisciplinary Inflammatory [facility]el Disease Clinic

## 2020-03-21 NOTE — Unmapped (Addendum)
INSTRUCTIONS:     1.  Continue the Stelara every 8 weeks.  2.  Continue to watch your weight periodically.  3.  Reach out to [place] if you have any issues with worsening constipation or recurrence of your diarrhea.  4.  Recommend shingles vaccine (Shingrix) - this can be given at your local pharmacy.  5.  If any trouble or symptoms, do not hesitate to call.    Mardee Postin, MD  Division of Gastroenterology & Hepatology  [place]    APPOINTMENT [facility] FOR GI CLINIC AND GI PROCEDURES:  GI MEDICINE CLINIC: [phone] option 1  GI PROCEDURES: [phone] option 2  *To schedule, reschedule, or cancel your GI appointment, please call [phone]. If you are unable to come to an appointment, please notify us as soon as possible, preferably 24 hours in advance. Doing so may allow other patients with urgent needs to be scheduled in a cancelled appointment slot.  RADIOLOGY - to schedule imaging ordered, please call [phone] option 1    IBD NURSE COORDINATOR CONTACT ??? Neta Mends, RN BSN  Phone: [phone] (direct line)  Fax: [phone]  * For urgent medical concerns after hours or on weekends and holidays, call [phone] and ask to speak to the GI Fellow on call.  * If you have a GI medical question or GI symptoms and would like to speak to your provider???s healthcare team, please contact Neta Mends (contact information above) OR you can send the GI healthcare team a message through MyChart at [email or web address]    TEST RESULTS  If you have a MyChart account, your new results and a provider message will be sent to you through your MyChart account at [email or web address]. For results that require follow-up, a member of your healthcare team will also contact you directly.    PRESCRIPTION REFILL REQUESTS  To request prescription refills, please contact your pharmacy or send your healthcare team a message through your MyChart account at [email or web address]    RECORD REQUESTS  For questions related to medical records, please call Medical Records Release of Information at [phone]    FINANCIAL COUNSELOR  For billing and other financial questions/needs ??? please contact Mee Hives at [phone]. If you need to leave a message, please be sure to leave your full name, date of birth or MR#, best call back # and reason for call.    For educational material and resources:  [email or web address]  ============================================

## 2020-03-30 NOTE — Unmapped (Signed)
[facility] Clinical Assessment & Refill Coordination Note    Navah Grondin, DOB: 02/04/1966  Phone: [phone] (home)     All above HIPAA information was verified with patient.     Was a Nurse, learning disability used for this call? No    Specialty Medication(s):   Inflammatory Disorders: Stelara     Current Outpatient Medications   Medication Sig Dispense Refill   ??? biotin 1 mg tablet Take 1,000 mcg by mouth every morning before breakfast.     ??? estradiol (ESTRACE) 1 MG tablet Take 1 mg by mouth nightly.      ??? FUROSEMIDE ORAL Take 10 mg by mouth daily as needed.     ??? gabapentin (NEURONTIN) 300 MG capsule Take 900 mg by mouth nightly.     ??? glucosamine-chondroitin 500-400 mg tablet Take 2 tablets by mouth nightly.      ??? hyoscyamine (LEVSIN/SL) 0.125 mg SL tablet Place 1 tablet (0.125 mg total) under the tongue every four (4) hours as needed for cramping. 30 tablet 1   ??? omega-3 fatty acids-fish oil (FISH OIL) 300-1,000 mg cap Take 1 capsule by mouth daily. Last taken 05/26/15     ??? polyethylene glycol (CLEARLAX) 17 gram/dose powder Take as directed per instruction sheet from [facility] Provider. 238 g 0   ??? polyethylene glycol (CLEARLAX) 17 gram/dose powder Take as directed per instruction sheet from [facility] Provider. 119 g 0   ??? simethicone (MYLICON) 125 MG chewable tablet Take as directed per instruction sheet from [facility] Provider. 4 tablet 0   ??? UNABLE TO FIND Take 1,000 mg by mouth two (2) times a day. Med Name: tumeric     ??? UNABLE TO FIND Take 1 capsule by mouth two (2) times a day. Med Name: cherry extract     ??? ustekinumab (STELARA) 90 mg/mL Syrg syringe Inject the contents of 1 syringe (90 mg total) under the skin every 8 weeks. 1 mL 5     No current facility-administered medications for this visit.        Changes to medications: Francisco reports no changes at this time.    Allergies   Allergen Reactions   ??? Mercaptopurine Other (See Comments)     Other reaction(s): Pancreatitis.  Used to treat pt. Crohn's Disease.  Caused Pancreatis 1992.       Other reaction(s): Other (See Comments)  Used to treat pt. Crohn's Disease.  Caused Pancreatis 1992.   ??? Amitriptyline    ??? Metoprolol Other (See Comments)     Extreme fatigue   ??? Simvastatin    ??? Trazodone Hcl    ??? Morphine Itching     States can take short term.   ??? Penicillins Rash             Changes to allergies: No    SPECIALTY MEDICATION ADHERENCE     Stelara 90 mg/ml: 0 days of medicine on hand       Medication Adherence    Patient reported X missed doses in the last month: 0  Specialty Medication: Stelara 90mg /ml  Patient is on additional specialty medications: No  Informant: patient          Specialty medication(s) dose(s) confirmed: Regimen is correct and unchanged.     Are there any concerns with adherence? No    Adherence counseling provided? Not needed    CLINICAL MANAGEMENT AND INTERVENTION      Clinical Benefit Assessment:    Do you feel the medicine  is effective or helping your condition? Yes    Clinical Benefit counseling provided? Not needed    Adverse Effects Assessment:    Are you experiencing any side effects? No    Are you experiencing difficulty administering your medicine? No    Quality of Life Assessment:    How many days over the past month did your Crohn's  keep you from your normal activities? For example, brushing your teeth or getting up in the morning. 0    Have you discussed this with your provider? Not needed    Therapy Appropriateness:    Is therapy appropriate? Yes, therapy is appropriate and should be continued    DISEASE/MEDICATION-SPECIFIC INFORMATION      For patients on injectable medications: Patient currently has 0 doses left.  Next injection is scheduled for 11/28.    PATIENT SPECIFIC NEEDS     - Does the patient have any physical, cognitive, or cultural barriers? No    - Is the patient high risk? No    - Does the patient require a Care Management Plan? No     - Does the patient require physician intervention or other additional services (i.e. nutrition, smoking cessation, social work)? No      SHIPPING     Specialty Medication(s) to be Shipped:   Inflammatory Disorders: Stelara    Other medication(s) to be shipped: No additional medications requested for fill at this time     Changes to insurance: No    Delivery Scheduled: Yes, Expected medication delivery date: 11/23.     Medication will be delivered via UPS to the confirmed prescription address in [facility].    The patient will receive a drug information handout for each medication shipped and additional FDA Medication Guides as required.  Verified that patient has previously received a Conservation officer, historic buildings.    All of the patient's questions and concerns have been addressed.    Julianne Rice   [facility] Specialty Pharmacist

## 2020-04-03 MED FILL — STELARA 90 MG/ML SUBCUTANEOUS SYRINGE: 56 days supply | Qty: 1 | Fill #2 | Status: AC

## 2020-04-03 MED FILL — STELARA 90 MG/ML SUBCUTANEOUS SYRINGE: SUBCUTANEOUS | 56 days supply | Qty: 1 | Fill #2

## 2020-04-25 ENCOUNTER — Telehealth (INDEPENDENT_AMBULATORY_CARE_PROVIDER_SITE_OTHER): Payer: Medicare HMO | Admitting: Nurse Practitioner

## 2020-04-25 VITALS — BP 134/72 | HR 58 | Temp 97.7°F | Ht 65.0 in | Wt 160.0 lb

## 2020-04-25 DIAGNOSIS — D849 Immunodeficiency, unspecified: Secondary | ICD-10-CM

## 2020-04-25 DIAGNOSIS — K50818 Crohn's disease of both small and large intestine with other complication: Secondary | ICD-10-CM

## 2020-04-25 DIAGNOSIS — J0100 Acute maxillary sinusitis, unspecified: Secondary | ICD-10-CM

## 2020-04-25 MED ORDER — AZITHROMYCIN 250 MG PO TABS: ORAL_TABLET | ORAL | 0 refills | Status: DC

## 2020-04-25 MED ORDER — LORATADINE 10 MG PO TABS: 10.0000 mg | ORAL_TABLET | Freq: Every day | ORAL | 0 refills | Status: DC

## 2020-04-25 NOTE — Progress Notes (Signed)
Virtual Visit via Telephone Note   This visit type was conducted due to national recommendations for restrictions regarding the COVID-19 Pandemic (e.g. social distancing) in an effort to limit this patient's exposure and mitigate transmission in our community.  Due to her co-morbid illnesses, this patient is at least at moderate risk for complications without adequate follow up.  This format is felt to be most appropriate for this patient at this time.  The patient did not have access to video technology/had technical difficulties with video requiring transitioning to audio format only (telephone).  All issues noted in this document were discussed and addressed.  No physical exam could be performed with this format.  Patient verbally consented to a telehealth visit.   Date:  04/25/2020   ID:  Brittany [name], DOB 1965-09-09, MRN [id number]  Patient Location: Home Provider Location: Office/Clinic  PCP:  [name], MD   Evaluation Performed:  Established patient, acute visit  Chief Complaint: Sinus congestion  History of Present Illness:    Brittany [name] is a 54 y.o. female with sinus congestion/pressure/tenderness, headache,malaise, post-nasal drip, and cough.Onset of symptoms 4 days ago. Treatment includes Mucinex DM which alleviated symptoms minimumally. States sinus mucus is thick and green. Past medical history includes Crohn's disease that is treated with Stelara, hypertension, hyperlipidemia, and Type 2 DM that is diet-controlled.  The patient does have symptoms concerning for COVID-19 infection (fever, chills, cough, or new shortness of breath).    Past Medical History:  Diagnosis Date  . BMI 26.0-26.9,adult 07/06/2019  . Bradycardia 08/10/2014  . Crohn's disease ([place])   . DDD (degenerative disc disease), lumbar 07/17/2017  . Diabetes mellitus without complication ([place])   . Fatigue 08/10/2014  . Hyperlipidemia   . Hyperlipidemia associated with type 2 diabetes mellitus ([place])  07/06/2019  . Hypertension   . Hypertension, essential   . Mixed hyperlipidemia   . Palpitations 07/06/2019  . Paresthesia 07/06/2019    Past Surgical History:  Procedure Laterality Date  . ABDOMINAL HYSTERECTOMY N/A 10/20/2012   Procedure: HYSTERECTOMY ABDOMINAL;  Surgeon: [name], MD;  Location: [facility];  Service: Gynecology;  Laterality: N/A;  . ANAL FISSURE REPAIR  2002  . APPENDECTOMY    . SALPINGOOPHORECTOMY Bilateral 10/20/2012   Procedure: SALPINGO OOPHORECTOMY;  Surgeon: [name], MD;  Location: [facility];  Service: Gynecology;  Laterality: Bilateral;  . SMALL INTESTINE SURGERY     reconstruction  . TONSILLECTOMY      Family History  Problem Relation Age of Onset  . Heart Problems Mother   . Diabetes Mother   . Diabetes Father   . Heart Problems Father   . Cancer Maternal Grandmother        lung  . Peptic Ulcer Disease Other     Social History   Socioeconomic History  . Marital status: Widowed    Spouse name: Not on file  . Number of children: 1  . Years of education: Not on file  . Highest education level: Not on file  Occupational History  . Not on file  Tobacco Use  . Smoking status: Former Smoker    Quit date: 2002    Years since quitting: 19.9  . Smokeless tobacco: Never Used  Vaping Use  . Vaping Use: Never used  Substance and Sexual Activity  . Alcohol use: No  . Drug use: No  . Sexual activity: Not on file  Other Topics Concern  . Not on file  Social History Narrative   wears  sunscreen, brushes and flosses daily, see's dentist bi-annually, has smoke/carbon monoxide detectors, wears a seatbelt and practices gun safety   Social Determinants of Health   Financial Resource Strain: Not on file  Food Insecurity: Not on file  Transportation Needs: No Transportation Needs  . Lack of Transportation (Medical): No  . Lack of Transportation (Non-Medical): No  Physical Activity: Not on file  Stress: Not on file  Social Connections: Not on file   Intimate Partner Violence: Not on file    Outpatient Medications Prior to Visit  Medication Sig Dispense Refill  . ALPRAZolam (XANAX) 0.5 MG tablet TAKE 1 TABLET BY MOUTH TWICE A DAY 60 tablet 2  . Biotin 1000 MCG tablet Take 1,000 mcg by mouth daily.    . calcium-vitamin D (OSCAL WITH D) 500-200 MG-UNIT per tablet Take 1 tablet by mouth daily.    . cholecalciferol (VITAMIN D3) 25 MCG (1000 UNIT) tablet Take 1,000 Units by mouth daily.    Marland Kitchen estradiol (ESTRACE) 1 MG tablet TAKE 1 TABLET EVERY DAY 90 tablet 3  . fish oil-omega-3 fatty acids 1000 MG capsule Take 1 g by mouth daily.    Marland Kitchen gabapentin (NEURONTIN) 300 MG capsule Take 1 capsule (300 mg total) by mouth 3 (three) times daily. 270 capsule 1  . Glucosamine-Chondroit-Vit C-Mn (GLUCOSAMINE 1500 COMPLEX PO) Glucosamine    . Melatonin 10 MG CAPS Take 10 mg by mouth at bedtime.     . Misc Natural Products (TART CHERRY ADVANCED PO) Take by mouth daily.    . TURMERIC PO Take by mouth in the morning and at bedtime.    . ustekinumab (STELARA) 90 MG/ML SOSY injection Inject the contents of 1 syringe (90mg ) under the skin every 8 weeks.     No facility-administered medications prior to visit.    Allergies:   Mercaptopurine, Amitriptyline, Metoprolol, Trazodone and nefazodone, Zocor [simvastatin], Morphine and related, and Penicillins   Social History   Tobacco Use  . Smoking status: Former Smoker    Quit date: 2002    Years since quitting: 19.9  . Smokeless tobacco: Never Used  Vaping Use  . Vaping Use: Never used  Substance Use Topics  . Alcohol use: No  . Drug use: No     Review of Systems  Constitutional: Positive for malaise/fatigue. Negative for chills and fever.  HENT: Positive for congestion and sinus pain (pressure).        Post-nasal drip; tenderness to frontal sinuses  Respiratory: Positive for cough. Negative for shortness of breath.   Cardiovascular: Negative for chest pain and palpitations.  Gastrointestinal:  Negative for diarrhea, nausea and vomiting.  Musculoskeletal: Negative for joint pain and myalgias.  Skin: Negative for rash.  Neurological: Positive for headaches.  Endo/Heme/Allergies:       Immunosuppressed     Labs/Other Tests and Data Reviewed:    Recent Labs: 10/26/2019: TSH 1.600 02/17/2020: ALT 17; BUN 14; Creatinine, Ser 0.67; Hemoglobin 13.8; Magnesium 2.2; Platelets 225; Potassium 4.2; Sodium 142   Recent Lipid Panel Lab Results  Component Value Date/Time   CHOL 251 (H) 02/17/2020 08:25 AM   TRIG 103 02/17/2020 08:25 AM   HDL 91 02/17/2020 08:25 AM   CHOLHDL 2.8 02/17/2020 08:25 AM   LDLCALC 143 (H) 02/17/2020 08:25 AM    Wt Readings from Last 3 Encounters:  02/17/20 162 lb 12.8 oz (73.8 kg)  10/26/19 168 lb 3.2 oz (76.3 kg)  07/15/19 163 lb 3.2 oz (74 kg)     Objective:  Vital Signs: BP 134/72   Pulse (!) 58   Temp 97.7 F (36.5 C)   Ht 5\' 5"  (1.651 m)   Wt 160 lb (72.6 kg)   LMP 09/23/2012   BMI 26.63 kg/m     LMP 09/23/2012    Physical Exam No Physical exam due to telemedicine visit   ASSESSMENT & PLAN:   1. Acute non-recurrent maxillary sinusitis - azithromycin (ZITHROMAX) 250 MG tablet; Take two tablets by mouth on day 1 Take one tablet by mouth on day 2-5  Dispense: 6 tablet; Refill: 0 - loratadine (CLARITIN) 10 MG tablet; Take 1 tablet (10 mg total) by mouth daily.  Dispense: 90 tablet; Refill: 0  2. Crohn's disease of both small and large intestine with other complication ([place]) -Continue Stelara every 8-weeks as directed  3. Immunosuppressed status ([place])  Rest and push fluids, especially water Continue Mucinex twice daily as needed  COVID-19 Education: The signs and symptoms of COVID-19 were discussed with the patient and how to seek care for testing (follow up with PCP or arrange E-visit). The importance of social distancing was discussed today.  11:32-11:37   I spent 5 minutes dedicated to the care of this patient on the date of  this telephone encounter.  Follow Up:  Virtual Visit  prn  Varney Baas, DNP  04/26/2020 at [place]

## 2020-04-27 ENCOUNTER — Encounter: Payer: Self-pay | Admitting: Nurse Practitioner

## 2020-05-25 NOTE — Unmapped (Signed)
[facility] Refill Coordination Note    Specialty Medication(s) to be Shipped:   Inflammatory Disorders: Stelara 90mg /ml  Other medication(s) to be shipped: No additional medications requested for fill at this time     Brenda Beard, DOB: Jul 16, 1965  Phone: [phone] (home)     All above HIPAA information was verified with patient.     Was a Nurse, learning disability used for this call? No    Completed refill call assessment today to schedule patient's medication shipment from the [facility] [phone]).       Specialty medication(s) and dose(s) confirmed: Regimen is correct and unchanged.   Changes to medications: Brenda Beard reports no changes at this time.  Changes to insurance: No  Questions for the pharmacist: No    Confirmed patient received Welcome Packet with first shipment. The patient will receive a drug information handout for each medication shipped and additional FDA Medication Guides as required.       DISEASE/MEDICATION-SPECIFIC INFORMATION        For patients on injectable medications: Patient currently has 0 doses left.  Next injection is scheduled for 06/09/2020.    SPECIALTY MEDICATION ADHERENCE     Medication Adherence    Patient reported X missed doses in the last month: 0  Specialty Medication: Stelara 90mg /ml  Patient is on additional specialty medications: No  Informant: patient  Reliability of informant: reliable        SHIPPING     Shipping address confirmed in Epic.     Delivery Scheduled: Yes, Expected medication delivery date: 06/01/2020.     Medication will be delivered via UPS to the prescription address in Epic WAM.    Jaydeen Darley P [name] [facility] Specialty Technician

## 2020-05-31 MED FILL — STELARA 90 MG/ML SUBCUTANEOUS SYRINGE: SUBCUTANEOUS | 56 days supply | Qty: 1 | Fill #3

## 2020-06-06 ENCOUNTER — Other Ambulatory Visit: Payer: Self-pay

## 2020-06-06 ENCOUNTER — Ambulatory Visit (INDEPENDENT_AMBULATORY_CARE_PROVIDER_SITE_OTHER): Payer: Medicare HMO | Admitting: Family Medicine

## 2020-06-06 VITALS — BP 122/70 | HR 48 | Temp 97.0°F | Ht 65.0 in | Wt 166.0 lb

## 2020-06-06 DIAGNOSIS — I10 Essential (primary) hypertension: Secondary | ICD-10-CM | POA: Diagnosis not present

## 2020-06-06 DIAGNOSIS — Z0001 Encounter for general adult medical examination with abnormal findings: Secondary | ICD-10-CM

## 2020-06-06 DIAGNOSIS — K50818 Crohn's disease of both small and large intestine with other complication: Secondary | ICD-10-CM

## 2020-06-06 DIAGNOSIS — Z0000 Encounter for general adult medical examination without abnormal findings: Secondary | ICD-10-CM

## 2020-06-06 DIAGNOSIS — E782 Mixed hyperlipidemia: Secondary | ICD-10-CM | POA: Diagnosis not present

## 2020-06-06 DIAGNOSIS — L650 Telogen effluvium: Secondary | ICD-10-CM

## 2020-06-06 DIAGNOSIS — R7303 Prediabetes: Secondary | ICD-10-CM

## 2020-06-06 DIAGNOSIS — G609 Hereditary and idiopathic neuropathy, unspecified: Secondary | ICD-10-CM | POA: Diagnosis not present

## 2020-06-06 NOTE — Progress Notes (Unsigned)
Subjective:  Patient ID: Brittany [name], female    DOB: [birth year]  Age: 55 y.o. MRN: [id number]  Chief Complaint  Patient presents with  . Diabetes  . Hyperlipidemia  . Hypertension    HPI Encounter for general adult medical examination without abnormal findings  Physical: Patient's last physical exam was 1 year ago .  Smoking: Life-long non-smoker ;  Physical Activity: Exercises at least 3 times per week ;  Alcohol/Drug Use: Drink ocassionally ; No illicit drug use ;  Patient is not afflicted from Stress Incontinence and Urge Incontinence  Safety: reviewed ; Patient wears a seat belt, has smoke detectors, has carbon monoxide detectors, practices appropriate gun safety, and wears sunscreen with extended sun exposure. Dental Care: biannual cleanings, brushes and flosses daily. Ophthalmology/Optometry: Annual visit.  Hearing loss: none Vision impairments: none  Hyperlipidemia: Patient is taking fish oil for cholesterol. She also eats healthy. Hypertension: Patient is controlling her blood pressure losing weight and eating healthy. Diabetes: Patient is not taking any medication for diabetes. She watched her diet and eat healthy.  No flowsheet data found.   Depression screen [facility] 2/9 06/10/2020 02/17/2020 02/17/2020 09/15/2019 07/06/2019  Decreased Interest 0 0 0 0 0  Down, Depressed, Hopeless 0 0 0 0 0  PHQ - 2 Score 0 0 0 0 0  Altered sleeping - - - - -  Tired, decreased energy - - - - -  Change in appetite - - - - -  Feeling bad or failure about yourself  - - - - -  Trouble concentrating - - - - -  Moving slowly or fidgety/restless - - - - -  Suicidal thoughts - - - - -  PHQ-9 Score - - - - -  Difficult doing work/chores - - - - -       Functional Status Survey: Is the patient deaf or have difficulty hearing?: No Does the patient have difficulty seeing, even when wearing glasses/contacts?: No Does the patient have difficulty concentrating, remembering, or making decisions?:  No Does the patient have difficulty walking or climbing stairs?: No Does the patient have difficulty dressing or bathing?: No Does the patient have difficulty doing errands alone such as visiting a doctor's office or shopping?: No   Social Hx   Social History   Socioeconomic History  . Marital status: Widowed    Spouse name: Not on file  . Number of children: 1  . Years of education: Not on file  . Highest education level: Not on file  Occupational History  . Not on file  Tobacco Use  . Smoking status: Former Smoker    Quit date: 2002    Years since quitting: 20.0  . Smokeless tobacco: Never Used  Vaping Use  . Vaping Use: Never used  Substance and Sexual Activity  . Alcohol use: Yes  . Drug use: No  . Sexual activity: Not on file  Other Topics Concern  . Not on file  Social History Narrative   wears sunscreen, brushes and flosses daily, see's dentist bi-annually, has smoke/carbon monoxide detectors, wears a seatbelt and practices gun safety   Social Determinants of Health   Financial Resource Strain: Not on file  Food Insecurity: Not on file  Transportation Needs: No Transportation Needs  . Lack of Transportation (Medical): No  . Lack of Transportation (Non-Medical): No  Physical Activity: Not on file  Stress: Not on file  Social Connections: Not on file   Past Medical History:  Diagnosis Date  .  BMI 26.0-26.9,adult 07/06/2019  . Bradycardia 08/10/2014  . Crohn's disease ([place])   . DDD (degenerative disc disease), lumbar 07/17/2017  . Diabetes mellitus without complication ([place])   . Fatigue 08/10/2014  . Hyperlipidemia   . Hyperlipidemia associated with type 2 diabetes mellitus ([place]) 07/06/2019  . Hypertension   . Hypertension, essential   . Mixed hyperlipidemia   . Palpitations 07/06/2019  . Paresthesia 07/06/2019   Family History  Problem Relation Age of Onset  . Heart Problems Mother   . Diabetes Mother   . Diabetes Father   . Heart Problems Father   .  Cancer Maternal Grandmother        lung  . Peptic Ulcer Disease Other     Review of Systems  Constitutional: Negative for chills, fatigue and fever.  HENT: Negative for congestion, ear pain and sore throat.   Respiratory: Negative for cough and shortness of breath.   Cardiovascular: Negative for chest pain.  Gastrointestinal: Negative for abdominal pain, constipation, diarrhea, nausea and vomiting.  Genitourinary: Negative for dysuria and urgency.  Musculoskeletal: Negative for arthralgias and myalgias.  Skin: Negative for rash.  Neurological: Negative for dizziness and headaches.  Psychiatric/Behavioral: Negative for dysphoric mood. The patient is not nervous/anxious.      Objective:  BP 122/70   Pulse (!) 48   Temp (!) 97 F (36.1 C)   Ht 5\' 5"  (1.651 m)   Wt 166 lb (75.3 kg)   LMP 09/23/2012   BMI 27.62 kg/m   BP/Weight 06/06/2020 04/25/2020 16/05/958  Systolic BP 454 098 119  Diastolic BP 70 72 60  Wt. (Lbs) 166 160 162.8  BMI 27.62 26.63 27.09    Physical Exam Vitals reviewed.  Constitutional:      Appearance: Normal appearance. She is normal weight.  Neck:     Vascular: No carotid bruit.  Cardiovascular:     Rate and Rhythm: Normal rate and regular rhythm.     Pulses: Normal pulses.     Heart sounds: Normal heart sounds.  Pulmonary:     Effort: Pulmonary effort is normal. No respiratory distress.     Breath sounds: Normal breath sounds.  Abdominal:     General: Abdomen is flat. Bowel sounds are normal.     Palpations: Abdomen is soft.     Tenderness: There is no abdominal tenderness.  Neurological:     Mental Status: She is alert and oriented to person, place, and time.  Psychiatric:        Mood and Affect: Mood normal.        Behavior: Behavior normal.     Lab Results  Component Value Date   WBC 6.0 06/06/2020   HGB 13.4 06/06/2020   HCT 40.0 06/06/2020   PLT 203 06/06/2020   GLUCOSE 91 06/06/2020   CHOL 250 (H) 06/06/2020   TRIG 132  06/06/2020   HDL 89 06/06/2020   LDLCALC 138 (H) 06/06/2020   ALT 10 06/06/2020   AST 15 06/06/2020   NA 143 06/06/2020   K 4.6 06/06/2020   CL 105 06/06/2020   CREATININE 0.66 06/06/2020   BUN 15 06/06/2020   CO2 24 06/06/2020   TSH 1.990 06/06/2020   INR 0.86 10/20/2012   HGBA1C 5.3 06/06/2020   MICROALBUR 80 07/06/2019      Assessment & Plan:  1. Encounter for general adult medical examination with abnormal findings Healthy female. Continue to eat healthy and exercise.  2. Telogen effluvium - TSH - Ferritin  3. Mixed  hyperlipidemia Low fat diet and exercise.  Continue fish oil. - Lipid panel  4. Hypertension, essential Low salt diet and exercise.  No medications needed. - CBC with Differential/Platelet - Comprehensive metabolic panel  5. Crohn's disease of both small and large intestine with other complication ([place]) Management per specialist. GI. Continue stelara.  6. Idiopathic neuropathy Continue gabapentin. - TSH - Methylmalonic acid, serum - B12 and Folate Panel  7. Prediabetes Low sugar diet and exercise.  - Hemoglobin A1c   This is a list of the screening recommended for you and due dates:  Health Maintenance  Topic Date Due  .  Hepatitis C: One time screening is recommended by Center for Disease Control  (CDC) for  adults born from [birth year] through Sep 22, 1965.   Never done  . HIV Screening  Never done  . Tetanus Vaccine  Never done  . Colon Cancer Screening  Never done  . Mammogram  Never done  . Eye exam for diabetics  06/18/2019  . COVID-19 Vaccine (3 - Booster for Pfizer series) 02/24/2020  . Urine Protein Check  07/05/2020  . Complete foot exam   10/25/2020  . Hemoglobin A1C  12/04/2020  . Flu Shot  Completed  . Pneumococcal vaccine  Completed  . Pap Smear  Discontinued     AN INDIVIDUALIZED CARE PLAN: was established or reinforced today.   SELF MANAGEMENT: The patient and I together assessed ways to personally work towards obtaining the  recommended goals  Support needs The patient and/or family needs were assessed and services were offered and not necessary at this time.    Follow-up: Return in about 3 months (around 09/04/2020) for fasting.[name], MD [place] [phone]

## 2020-06-10 ENCOUNTER — Encounter: Payer: Self-pay | Admitting: Family Medicine

## 2020-06-11 ENCOUNTER — Encounter: Payer: Self-pay | Admitting: Family Medicine

## 2020-06-11 LAB — CBC WITH DIFFERENTIAL/PLATELET
Basophils Absolute: 0.1 10*3/uL (ref 0.0–0.2)
Basos: 1 %
EOS (ABSOLUTE): 0.1 10*3/uL (ref 0.0–0.4)
Eos: 1 %
Hematocrit: 40 % (ref 34.0–46.6)
Hemoglobin: 13.4 g/dL (ref 11.1–15.9)
Immature Grans (Abs): 0 10*3/uL (ref 0.0–0.1)
Immature Granulocytes: 0 %
Lymphocytes Absolute: 1.9 10*3/uL (ref 0.7–3.1)
Lymphs: 32 %
MCH: 30.2 pg (ref 26.6–33.0)
MCHC: 33.5 g/dL (ref 31.5–35.7)
MCV: 90 fL (ref 79–97)
Monocytes Absolute: 0.4 10*3/uL (ref 0.1–0.9)
Monocytes: 7 %
Neutrophils Absolute: 3.6 10*3/uL (ref 1.4–7.0)
Neutrophils: 59 %
Platelets: 203 10*3/uL (ref 150–450)
RBC: 4.44 x10E6/uL (ref 3.77–5.28)
RDW: 12 % (ref 11.7–15.4)
WBC: 6 10*3/uL (ref 3.4–10.8)

## 2020-06-11 LAB — HEMOGLOBIN A1C
Est. average glucose Bld gHb Est-mCnc: 105 mg/dL
Hgb A1c MFr Bld: 5.3 % (ref 4.8–5.6)

## 2020-06-11 LAB — COMPREHENSIVE METABOLIC PANEL
ALT: 10 IU/L (ref 0–32)
AST: 15 IU/L (ref 0–40)
Albumin/Globulin Ratio: 1.9 (ref 1.2–2.2)
Albumin: 4.4 g/dL (ref 3.8–4.9)
Alkaline Phosphatase: 56 IU/L (ref 44–121)
BUN/Creatinine Ratio: 23 (ref 9–23)
BUN: 15 mg/dL (ref 6–24)
Bilirubin Total: 0.6 mg/dL (ref 0.0–1.2)
CO2: 24 mmol/L (ref 20–29)
Calcium: 9.5 mg/dL (ref 8.7–10.2)
Chloride: 105 mmol/L (ref 96–106)
Creatinine, Ser: 0.66 mg/dL (ref 0.57–1.00)
GFR calc Af Amer: 114 mL/min/{1.73_m2} (ref >59)
GFR calc non Af Amer: 99 mL/min/{1.73_m2} (ref >59)
Globulin, Total: 2.3 g/dL (ref 1.5–4.5)
Glucose: 91 mg/dL (ref 65–99)
Potassium: 4.6 mmol/L (ref 3.5–5.2)
Sodium: 143 mmol/L (ref 134–144)
Total Protein: 6.7 g/dL (ref 6.0–8.5)

## 2020-06-11 LAB — B12 AND FOLATE PANEL
Folate: 14 ng/mL (ref >3.0)
Vitamin B-12: 1430 pg/mL — ABNORMAL HIGH (ref 232–1245)

## 2020-06-11 LAB — LIPID PANEL
Chol/HDL Ratio: 2.8 ratio (ref 0.0–4.4)
Cholesterol, Total: 250 mg/dL — ABNORMAL HIGH (ref 100–199)
HDL: 89 mg/dL (ref >39)
LDL Chol Calc (NIH): 138 mg/dL — ABNORMAL HIGH (ref 0–99)
Triglycerides: 132 mg/dL (ref 0–149)
VLDL Cholesterol Cal: 23 mg/dL (ref 5–40)

## 2020-06-11 LAB — FERRITIN: Ferritin: 124 ng/mL (ref 15–150)

## 2020-06-11 LAB — CARDIOVASCULAR RISK ASSESSMENT

## 2020-06-11 LAB — TSH: TSH: 1.99 u[IU]/mL (ref 0.450–4.500)

## 2020-06-11 LAB — METHYLMALONIC ACID, SERUM: Methylmalonic Acid: 120 nmol/L (ref 0–378)

## 2020-06-12 ENCOUNTER — Encounter: Payer: Self-pay | Admitting: Family Medicine

## 2020-06-12 ENCOUNTER — Other Ambulatory Visit: Payer: Self-pay

## 2020-06-12 DIAGNOSIS — Z01419 Encounter for gynecological examination (general) (routine) without abnormal findings: Secondary | ICD-10-CM | POA: Diagnosis not present

## 2020-06-12 MED ORDER — EZETIMIBE 10 MG PO TABS: 10.0000 mg | ORAL_TABLET | Freq: Every day | ORAL | 0 refills | Status: DC

## 2020-06-14 ENCOUNTER — Other Ambulatory Visit: Payer: Self-pay | Admitting: Family Medicine

## 2020-06-14 MED ORDER — EZETIMIBE 10 MG PO TABS: 10.0000 mg | ORAL_TABLET | Freq: Every day | ORAL | 1 refills | Status: DC

## 2020-06-16 ENCOUNTER — Telehealth: Payer: Self-pay

## 2020-06-16 NOTE — Addendum Note (Signed)
Addended by: Thompson Caul I on: 06/16/2020 10:16 AM   Modules accepted: Orders

## 2020-06-16 NOTE — Progress Notes (Signed)
Chronic Care Management Pharmacy Assistant   Name: Brittany [name]  MRN: [id number] DOB: [birth year]  Reason for Encounter: Disease State for Lipid and hypertension  Patient Questions:  1.  Have you seen any other providers since your last visit? No  2.  Any changes in your medicines or health? No   PCP : [name], MD  Allergies:   Allergies  Allergen Reactions  . Mercaptopurine     Used to treat pt. Crohn's Disease.  Caused Pancreatis 1992.  . Amitriptyline   . Crestor [Rosuvastatin Calcium]     Muscle pain  . Metoprolol Other (See Comments)    Extreme fatigue Extreme fatigue   . Trazodone And Nefazodone   . Zocor [Simvastatin]   . Morphine And Related Itching  . Penicillins Rash    Medications: Outpatient Encounter Medications as of 06/16/2020  Medication Sig  . Biotin 1000 MCG tablet Take 1,000 mcg by mouth daily.  . calcium-vitamin D (OSCAL WITH D) 500-200 MG-UNIT per tablet Take 1 tablet by mouth daily.  . cholecalciferol (VITAMIN D3) 25 MCG (1000 UNIT) tablet Take 1,000 Units by mouth daily.  Marland Kitchen estradiol (ESTRACE) 1 MG tablet TAKE 1 TABLET EVERY DAY  . ezetimibe (ZETIA) 10 MG tablet Take 1 tablet (10 mg total) by mouth daily.  . fish oil-omega-3 fatty acids 1000 MG capsule Take 1 g by mouth daily.  Marland Kitchen gabapentin (NEURONTIN) 300 MG capsule Take 1 capsule (300 mg total) by mouth 3 (three) times daily.  . Glucosamine-Chondroit-Vit C-Mn (GLUCOSAMINE 1500 COMPLEX PO) Glucosamine  . Melatonin 10 MG CAPS Take 10 mg by mouth at bedtime.   . Misc Natural Products (TART CHERRY ADVANCED PO) Take by mouth daily.  . TURMERIC PO Take by mouth in the morning and at bedtime.  Marland Kitchen Ustekinumab (STELARA [place]) Inject into the skin every 8 (eight) weeks.   No facility-administered encounter medications on file as of 06/16/2020.    Current Diagnosis: Patient Active Problem List   Diagnosis Date Noted  . Myalgia 10/26/2019  . Paresthesia 07/06/2019  . Hyperlipidemia associated  with type 2 diabetes mellitus ([place]) 07/06/2019  . BMI 26.0-26.9,adult 07/06/2019  . Palpitations 07/06/2019  . Hypertension, essential   . Mixed hyperlipidemia   . DDD (degenerative disc disease), lumbar 07/17/2017  . Other fatigue 08/10/2014  . Bradycardia 08/10/2014  . Crohn's disease ([place]) 02/24/2013    Reviewed chart prior to disease state call. Spoke with patient regarding BP  Recent Office Vitals: BP Readings from Last 3 Encounters:  06/06/20 122/70  04/25/20 134/72  02/17/20 130/60   Pulse Readings from Last 3 Encounters:  06/06/20 (!) 48  04/25/20 (!) 58  02/17/20 60    Wt Readings from Last 3 Encounters:  06/06/20 166 lb (75.3 kg)  04/25/20 160 lb (72.6 kg)  02/17/20 162 lb 12.8 oz (73.8 kg)     Kidney Function Lab Results  Component Value Date/Time   CREATININE 0.66 06/06/2020 09:57 AM   CREATININE 0.67 02/17/2020 08:25 AM   GFRNONAA 99 06/06/2020 09:57 AM   GFRAA 114 06/06/2020 09:57 AM    BMP Latest Ref Rng & Units 06/06/2020 02/17/2020 10/26/2019  Glucose 65 - 99 mg/dL 91 90 57(L)  BUN 6 - 24 mg/dL 15 14 17   Creatinine 0.57 - 1.00 mg/dL 0.66 0.67 0.86  BUN/Creat Ratio 9 - 23 23 21 20   Sodium 134 - 144 mmol/L 143 142 142  Potassium 3.5 - 5.2 mmol/L 4.6 4.2 4.8  Chloride 96 - 106  mmol/L 105 104 103  CO2 20 - 29 mmol/L 24 26 26   Calcium 8.7 - 10.2 mg/dL 9.5 9.4 9.6    . Current antihypertensive regimen:  Patient is controlling with diet and exercise  . How often are you checking your Blood Pressure?  Patient is not really checking blood pressure at home.  . Current home BP readings: None  . What recent interventions/DTPs have been made by any provider to improve Blood Pressure control since last CPP Visit: Patient is taking fish oil watching diet and exercising.  . Any recent hospitalizations or ED visits since last visit with CPP? No   . What diet changes have been made to improve Blood Pressure Control?  Patient watches salt intake  . What  exercise is being done to improve your Blood Pressure Control?  Patient tries to use exercise ball as much as possible and stays active  Adherence Review: Is the patient currently on ACE/ARB medication? No Does the patient have >5 day gap between last estimated fill dates? No   06/16/2020 Name: Brittany [name] MRN: [id number] DOB: 1965/10/03 Brittany [name] is a 55 y.o. year old female who is a primary care patient of [name], Kirsten, MD.  Comprehensive medication review performed; Spoke to patient regarding cholesterol  Lipid Panel    Component Value Date/Time   CHOL 250 (H) 06/06/2020 0957   TRIG 132 06/06/2020 0957   HDL 89 06/06/2020 0957   LDLCALC 138 (H) 06/06/2020 0957    10-year ASCVD risk score: The 10-year ASCVD risk score Mikey Bussing DC Brooke Bonito., et al., 2013) is: 3.2%   Values used to calculate the score:     Age: 55 years     Sex: Female     Is Non-Hispanic African American: No     Diabetic: Yes     Tobacco smoker: No     Systolic Blood Pressure: 496 mmHg     Is BP treated: No     HDL Cholesterol: 89 mg/dL     Total Cholesterol: 250 mg/dL  . Current antihyperlipidemic regimen:  Patient is taking fish oil as directed  . ASCVD risk enhancing conditions: HTN   . What recent interventions/DTPs have been made by any provider to improve Cholesterol control since last CPP Visit: Patient is taking fish oil, watching diet and staying active.  . Any recent hospitalizations or ED visits since last visit with CPP? No   . What diet changes have been made to improve Cholesterol?  Patient tries to eat healthy  . What exercise is being done to improve Cholesterol?  Patient uses her exercise ball when she can, and tries to stay active  Adherence Review: Does the patient have >5 day gap between last estimated fill dates? No  Follow-Up:  Pharmacist Review  Donette Larry, CPP notofied  Clarita Leber, [place] Pharmacist Assistant [phone]

## 2020-06-20 NOTE — Progress Notes (Signed)
Virtual Visit via Telephone Note   This visit type was conducted due to national recommendations for restrictions regarding the COVID-19 Pandemic (e.g. social distancing) in an effort to limit this patient's exposure and mitigate transmission in our community.  Due to her co-morbid illnesses, this patient is at least at moderate risk for complications without adequate follow up.  This format is felt to be most appropriate for this patient at this time.  The patient did not have access to video technology/had technical difficulties with video requiring transitioning to audio format only (telephone).  All issues noted in this document were discussed and addressed.  No physical exam could be performed with this format.  Patient verbally consented to a telehealth visit.   Date:  06/21/2020   ID:  Brittany [name], DOB 02-10-1966, MRN [id number]  Patient Location: Home Provider Location: Office/Clinic  PCP:  [name], MD   Evaluation Performed:  Acute visit Chief Complaint:  URI  History of Present Illness:    Brittany [name] is a 55 y.o. female with URI symptoms started Sunday has been taking mucinex, tylenol which have helped some. C/o of fever and chills which have subsided, runny nose, sinus pain and pressure. Has not been exposed to covid that she is aware of. Has had 2 of 3 covid vaccines.  The patient has symptoms concerning for COVID-19 infection (fever, chills, cough, or new shortness of breath).    Past Medical History:  Diagnosis Date  . BMI 26.0-26.9,adult 07/06/2019  . Bradycardia 08/10/2014  . Crohn's disease ([place])   . DDD (degenerative disc disease), lumbar 07/17/2017  . Diabetes mellitus without complication ([place])   . Fatigue 08/10/2014  . Hyperlipidemia   . Hyperlipidemia associated with type 2 diabetes mellitus ([place]) 07/06/2019  . Hypertension   . Hypertension, essential   . Mixed hyperlipidemia   . Palpitations 07/06/2019  . Paresthesia 07/06/2019    Past Surgical  History:  Procedure Laterality Date  . ABDOMINAL HYSTERECTOMY N/A 10/20/2012   Procedure: HYSTERECTOMY ABDOMINAL;  Surgeon: [name], MD;  Location: [facility];  Service: Gynecology;  Laterality: N/A;  . ANAL FISSURE REPAIR  2002  . APPENDECTOMY    . SALPINGOOPHORECTOMY Bilateral 10/20/2012   Procedure: SALPINGO OOPHORECTOMY;  Surgeon: [name], MD;  Location: [facility];  Service: Gynecology;  Laterality: Bilateral;  . SMALL INTESTINE SURGERY     reconstruction  . TONSILLECTOMY      Family History  Problem Relation Age of Onset  . Heart Problems Mother   . Diabetes Mother   . Diabetes Father   . Heart Problems Father   . Cancer Maternal Grandmother        lung  . Peptic Ulcer Disease Other     Social History   Socioeconomic History  . Marital status: Widowed    Spouse name: Not on file  . Number of children: 1  . Years of education: Not on file  . Highest education level: Not on file  Occupational History  . Not on file  Tobacco Use  . Smoking status: Former Smoker    Quit date: 2002    Years since quitting: 20.1  . Smokeless tobacco: Never Used  Vaping Use  . Vaping Use: Never used  Substance and Sexual Activity  . Alcohol use: Yes  . Drug use: No  . Sexual activity: Not on file  Other Topics Concern  . Not on file  Social History Narrative   wears sunscreen, brushes and flosses daily, see's  dentist bi-annually, has smoke/carbon monoxide detectors, wears a seatbelt and practices gun safety   Social Determinants of Health   Financial Resource Strain: Not on file  Food Insecurity: Not on file  Transportation Needs: No Transportation Needs  . Lack of Transportation (Medical): No  . Lack of Transportation (Non-Medical): No  Physical Activity: Not on file  Stress: Not on file  Social Connections: Not on file  Intimate Partner Violence: Not on file    Outpatient Medications Prior to Visit  Medication Sig Dispense Refill  . Biotin 1000 MCG tablet Take 1,000 mcg  by mouth daily.    . calcium-vitamin D (OSCAL WITH D) 500-200 MG-UNIT per tablet Take 1 tablet by mouth daily.    . cholecalciferol (VITAMIN D3) 25 MCG (1000 UNIT) tablet Take 1,000 Units by mouth daily.    Marland Kitchen estradiol (ESTRACE) 1 MG tablet TAKE 1 TABLET EVERY DAY 90 tablet 3  . ezetimibe (ZETIA) 10 MG tablet Take 1 tablet (10 mg total) by mouth daily. 90 tablet 1  . fish oil-omega-3 fatty acids 1000 MG capsule Take 1 g by mouth daily.    Marland Kitchen gabapentin (NEURONTIN) 300 MG capsule Take 1 capsule (300 mg total) by mouth 3 (three) times daily. 270 capsule 1  . Glucosamine-Chondroit-Vit C-Mn (GLUCOSAMINE 1500 COMPLEX PO) Glucosamine    . Melatonin 10 MG CAPS Take 10 mg by mouth at bedtime.     . Misc Natural Products (TART CHERRY ADVANCED PO) Take by mouth daily.    . TURMERIC PO Take by mouth in the morning and at bedtime.    Marland Kitchen Ustekinumab (STELARA [place]) Inject into the skin every 8 (eight) weeks.     No facility-administered medications prior to visit.    Allergies:   Mercaptopurine, Amitriptyline, Crestor [rosuvastatin calcium], Metoprolol, Trazodone and nefazodone, Zocor [simvastatin], Morphine and related, and Penicillins   Social History   Tobacco Use  . Smoking status: Former Smoker    Quit date: 2002    Years since quitting: 20.1  . Smokeless tobacco: Never Used  Vaping Use  . Vaping Use: Never used  Substance Use Topics  . Alcohol use: Yes  . Drug use: No     Review of Systems  Constitutional: Positive for chills and fever. Negative for malaise/fatigue.  HENT: Positive for congestion, sinus pain and sore throat. Negative for ear pain.   Respiratory: Positive for cough. Negative for shortness of breath.   Cardiovascular: Negative for chest pain.  Gastrointestinal: Negative for nausea and vomiting.  Musculoskeletal: Negative for myalgias.  Neurological: Negative for headaches.     Labs/Other Tests and Data Reviewed:    Recent Labs: 02/17/2020: Magnesium 2.2 06/06/2020: ALT  10; BUN 15; Creatinine, Ser 0.66; Hemoglobin 13.4; Platelets 203; Potassium 4.6; Sodium 143; TSH 1.990   Recent Lipid Panel Lab Results  Component Value Date/Time   CHOL 250 (H) 06/06/2020 09:57 AM   TRIG 132 06/06/2020 09:57 AM   HDL 89 06/06/2020 09:57 AM   CHOLHDL 2.8 06/06/2020 09:57 AM   LDLCALC 138 (H) 06/06/2020 09:57 AM    Wt Readings from Last 3 Encounters:  06/06/20 166 lb (75.3 kg)  04/25/20 160 lb (72.6 kg)  02/17/20 162 lb 12.8 oz (73.8 kg)     Objective:    Vital Signs:  BP 128/74   Pulse (!) 50   Temp (!) 97.4 F (36.3 C)   LMP 09/23/2012    Physical Exam  sOUNDS HOARSE ASSESSMENT & PLAN:   1. Telogen effluvium Recommend rogaine (  highest otc dose.)  2. Acute non-recurrent sinusitis of other sinus  RX FOR CEFDINIR AND FLONASE SENT.   3. COVID-19 - COVID 19 RAPID TEST positive **Prescription sent to [facility] for Molnupiravir 200 mg capsule, take 4 capsules every 12 hours for 5 days. #40 with 0 refills. Patient is aware to pick up and to start medication immediately.   4. Crohn's disease On Stelara. Immune system suppressed.   I spent 10 minutes dedicated to the care of this patient on the date of this encounter to include face-to-face time via virtual visit  Follow Up:  Virtual Visit  prn  Signed,  [name], MD  06/21/2020 10:10 AM    [place]

## 2020-06-21 ENCOUNTER — Telehealth (INDEPENDENT_AMBULATORY_CARE_PROVIDER_SITE_OTHER): Payer: Medicare HMO | Admitting: Family Medicine

## 2020-06-21 VITALS — BP 128/74 | HR 50 | Temp 97.4°F

## 2020-06-21 DIAGNOSIS — U071 COVID-19: Secondary | ICD-10-CM

## 2020-06-21 DIAGNOSIS — J018 Other acute sinusitis: Secondary | ICD-10-CM | POA: Diagnosis not present

## 2020-06-21 DIAGNOSIS — L650 Telogen effluvium: Secondary | ICD-10-CM

## 2020-06-21 DIAGNOSIS — K50818 Crohn's disease of both small and large intestine with other complication: Secondary | ICD-10-CM | POA: Diagnosis not present

## 2020-06-21 LAB — POC COVID19 BINAXNOW: SARS Coronavirus 2 Ag: POSITIVE — AB

## 2020-06-21 MED ORDER — CEFDINIR 300 MG PO CAPS: 300.0000 mg | ORAL_CAPSULE | Freq: Two times a day (BID) | ORAL | 0 refills | Status: DC

## 2020-06-21 MED ORDER — FLUTICASONE PROPIONATE 50 MCG/ACT NA SUSP: 2.0000 | Freq: Every day | NASAL | 0 refills | Status: DC

## 2020-06-27 ENCOUNTER — Encounter: Payer: Self-pay | Admitting: Family Medicine

## 2020-07-05 ENCOUNTER — Encounter: Payer: Self-pay | Admitting: Family Medicine

## 2020-07-10 ENCOUNTER — Telehealth: Payer: Self-pay

## 2020-07-10 NOTE — Progress Notes (Signed)
    Chronic Care Management Pharmacy Assistant   Name: Brittany [name]  MRN: [id number] DOB: 01-26-1966  Reason for Encounter: Adherence   PCP : [name], MD  Allergies:   Allergies  Allergen Reactions  . Mercaptopurine     Used to treat pt. Crohn's Disease.  Caused Pancreatis 1992.  . Amitriptyline   . Crestor [Rosuvastatin Calcium]     Muscle pain  . Metoprolol Other (See Comments)    Extreme fatigue Extreme fatigue   . Trazodone And Nefazodone   . Zocor [Simvastatin]   . Morphine And Related Itching  . Penicillins Rash    Medications: Outpatient Encounter Medications as of 07/10/2020  Medication Sig  . Biotin 1000 MCG tablet Take 1,000 mcg by mouth daily.  . calcium-vitamin D (OSCAL WITH D) 500-200 MG-UNIT per tablet Take 1 tablet by mouth daily.  . cefdinir (OMNICEF) 300 MG capsule Take 1 capsule (300 mg total) by mouth 2 (two) times daily.  . cholecalciferol (VITAMIN D3) 25 MCG (1000 UNIT) tablet Take 1,000 Units by mouth daily.  Marland Kitchen estradiol (ESTRACE) 1 MG tablet TAKE 1 TABLET EVERY DAY  . ezetimibe (ZETIA) 10 MG tablet Take 1 tablet (10 mg total) by mouth daily.  . fish oil-omega-3 fatty acids 1000 MG capsule Take 1 g by mouth daily.  . fluticasone (FLONASE) 50 MCG/ACT nasal spray Place 2 sprays into both nostrils daily.  Marland Kitchen gabapentin (NEURONTIN) 300 MG capsule Take 1 capsule (300 mg total) by mouth 3 (three) times daily.  . Glucosamine-Chondroit-Vit C-Mn (GLUCOSAMINE 1500 COMPLEX PO) Glucosamine  . Melatonin 10 MG CAPS Take 10 mg by mouth at bedtime.   . Misc Natural Products (TART CHERRY ADVANCED PO) Take by mouth daily.  . TURMERIC PO Take by mouth in the morning and at bedtime.  Marland Kitchen Ustekinumab (STELARA [place]) Inject into the skin every 8 (eight) weeks.   No facility-administered encounter medications on file as of 07/10/2020.    Current Diagnosis: Patient Active Problem List   Diagnosis Date Noted  . Myalgia 10/26/2019  . Paresthesia 07/06/2019  .  Hyperlipidemia associated with type 2 diabetes mellitus ([place]) 07/06/2019  . BMI 26.0-26.9,adult 07/06/2019  . Palpitations 07/06/2019  . Hypertension, essential   . Mixed hyperlipidemia   . DDD (degenerative disc disease), lumbar 07/17/2017  . Other fatigue 08/10/2014  . Bradycardia 08/10/2014  . Crohn's disease ([place]) 02/24/2013   Chart review noted the patient was positive for Covid on 06/21/20, Dr. Tobie Poet did a televisit and the patient was given prescription sent to [facility] for Molnupiravir 200 mg capsule, as well as Cefdinir and Flonase.   Patient has no follow up appointments scheduled at this time.   Adherence gap not available.   Follow-Up:  Pharmacist Review  Donette Larry, [place], [place] Pharmacist Assistant [phone]

## 2020-07-19 NOTE — Unmapped (Signed)
[facility] Clinical Assessment & Refill Coordination Note    Brenda Beard, DOB: 02/07/1966  Phone: [phone] (home)     All above HIPAA information was verified with patient.     Was a Nurse, learning disability used for this call? No    Specialty Medication(s):   Inflammatory Disorders: Stelara     Current Outpatient Medications   Medication Sig Dispense Refill   ??? biotin 1 mg tablet Take 1,000 mcg by mouth every morning before breakfast.     ??? estradiol (ESTRACE) 1 MG tablet Take 1 mg by mouth nightly.      ??? FUROSEMIDE ORAL Take 10 mg by mouth daily as needed.     ??? gabapentin (NEURONTIN) 300 MG capsule Take 900 mg by mouth nightly.     ??? glucosamine-chondroitin 500-400 mg tablet Take 2 tablets by mouth nightly.      ??? hyoscyamine (LEVSIN/SL) 0.125 mg SL tablet Place 1 tablet (0.125 mg total) under the tongue every four (4) hours as needed for cramping. 30 tablet 1   ??? omega-3 fatty acids-fish oil (FISH OIL) 300-1,000 mg cap Take 1 capsule by mouth daily. Last taken 05/26/15     ??? polyethylene glycol (CLEARLAX) 17 gram/dose powder Take as directed per instruction sheet from [facility] Provider. 238 g 0   ??? polyethylene glycol (CLEARLAX) 17 gram/dose powder Take as directed per instruction sheet from [facility] Provider. 119 g 0   ??? simethicone (MYLICON) 125 MG chewable tablet Take as directed per instruction sheet from [facility] Provider. 4 tablet 0   ??? UNABLE TO FIND Take 1,000 mg by mouth two (2) times a day. Med Name: tumeric     ??? UNABLE TO FIND Take 1 capsule by mouth two (2) times a day. Med Name: cherry extract     ??? ustekinumab (STELARA) 90 mg/mL Syrg syringe Inject the contents of 1 syringe (90 mg total) under the skin every 8 weeks. 1 mL 5     No current facility-administered medications for this visit.        Changes to medications: Danali reports starting the following medications: Zetia 10 daily for cholesterol    Allergies   Allergen Reactions   ??? Mercaptopurine Other (See Comments)     Other reaction(s): Pancreatitis.  Used to treat pt. Crohn's Disease.  Caused Pancreatis 1992.       Other reaction(s): Other (See Comments)  Used to treat pt. Crohn's Disease.  Caused Pancreatis 1992.   ??? Amitriptyline    ??? Metoprolol Other (See Comments)     Extreme fatigue   ??? Simvastatin    ??? Trazodone Hcl    ??? Morphine Itching     States can take short term.   ??? Penicillins Rash             Changes to allergies: No    SPECIALTY MEDICATION ADHERENCE          Medication Adherence    Patient reported X missed doses in the last month: 0  Specialty Medication: Stelara 90mg /ml  Patient is on additional specialty medications: No  Informant: patient          Specialty medication(s) dose(s) confirmed: Regimen is correct and unchanged.     Are there any concerns with adherence? No    Adherence counseling provided? Not needed    CLINICAL MANAGEMENT AND INTERVENTION      Clinical Benefit Assessment:    Do you feel the medicine is effective or helping your condition?  Yes    Clinical Benefit counseling provided? Progress note from 11/9 shows evidence of clinical benefit    Adverse Effects Assessment:    Are you experiencing any side effects? No    Are you experiencing difficulty administering your medicine? No    Quality of Life Assessment:    How many days over the past month did your Crohn's  keep you from your normal activities? For example, brushing your teeth or getting up in the morning. 0    Have you discussed this with your provider? Not needed    Therapy Appropriateness:    Is therapy appropriate? Yes, therapy is appropriate and should be continued    DISEASE/MEDICATION-SPECIFIC INFORMATION      For patients on injectable medications: Patient currently has 0 doses left.  Next injection is scheduled for 3/25 or 3/27.  Last one was given 2 days late so she can do either.    PATIENT SPECIFIC NEEDS     - Does the patient have any physical, cognitive, or cultural barriers? No    - Is the patient high risk? No    - Does the patient require a Care Management Plan? No     - Does the patient require physician intervention or other additional services (i.e. nutrition, smoking cessation, social work)? No      SHIPPING     Specialty Medication(s) to be Shipped:   Inflammatory Disorders: Stelara    Other medication(s) to be shipped: No additional medications requested for fill at this time     Changes to insurance: No    Delivery Scheduled: Yes, Expected medication delivery date: 3/16.     Medication will be delivered via UPS to the confirmed prescription address in [facility].    The patient will receive a drug information handout for each medication shipped and additional FDA Medication Guides as required.  Verified that patient has previously received a Conservation officer, historic buildings.    All of the patient's questions and concerns have been addressed.    Julianne Rice   [facility] Specialty Pharmacist

## 2020-07-20 ENCOUNTER — Other Ambulatory Visit: Payer: Self-pay | Admitting: Family Medicine

## 2020-07-21 ENCOUNTER — Telehealth: Payer: Self-pay

## 2020-07-21 ENCOUNTER — Other Ambulatory Visit: Payer: Self-pay | Admitting: Nurse Practitioner

## 2020-07-21 ENCOUNTER — Other Ambulatory Visit: Payer: Self-pay | Admitting: Family Medicine

## 2020-07-21 DIAGNOSIS — J0100 Acute maxillary sinusitis, unspecified: Secondary | ICD-10-CM

## 2020-07-21 DIAGNOSIS — E162 Hypoglycemia, unspecified: Secondary | ICD-10-CM

## 2020-07-21 MED ORDER — BLOOD GLUCOSE MONITOR KIT: PACK | 0 refills | Status: DC

## 2020-07-21 NOTE — Telephone Encounter (Signed)
Pt left VM requesting glucose monitor be ordered as she thinks her BG has been going up and down and she would like a way to check it. Wants it to go through insurance.   Called pt. Her boyfriend checked it because she felt fatigued and shaky. Eaten 1 hour before, BG was 170s. Later 5-6 hours after eating it was 76. Denies taking sugar medication, lost weight to manage diabetes. When BG was low she ate peanut butter to get it back up. Does not remember this happening back in January. According to January labs, fasting glucose was 91. A1c was normal.    Plan: make appointment for early next week with lab work. If pt can do glucose log over weekend.   Called pt back. Pt advised of plan. Pt scheduled for Tuesday Morning. When scheduling appointment saw pt has had Covid 19. In documentation pt was diagnosed 06/21/2020, FYI. Pt will try to document glucose over weekend if possible.   Brittany [name], [place] 07/21/20 12:24 PM

## 2020-07-24 NOTE — Progress Notes (Signed)
Subjective:  Patient ID: Brittany [name], female    DOB: 05/12/1966  Age: 55 y.o. MRN: [id number]  Chief Complaint  Patient presents with  . Blood Sugar Problem    HPI Diabetes:  Requesting a glucose monitor- patient states her FBS have been low at home readings over the weekend were 79-99. And 183 late Sunday night. Patient felt funny. Felt weak and jittery. Also fatigued. Is not eating  Regular meals. Skipping.   Current Outpatient Medications on File Prior to Visit  Medication Sig Dispense Refill  . Biotin 1000 MCG tablet Take 1,000 mcg by mouth daily.    . calcium-vitamin D (OSCAL WITH D) 500-200 MG-UNIT per tablet Take 1 tablet by mouth daily.    . cholecalciferol (VITAMIN D3) 25 MCG (1000 UNIT) tablet Take 1,000 Units by mouth daily.    Marland Kitchen estradiol (ESTRACE) 1 MG tablet TAKE 1 TABLET EVERY DAY 90 tablet 3  . ezetimibe (ZETIA) 10 MG tablet Take 1 tablet (10 mg total) by mouth daily. 90 tablet 1  . fish oil-omega-3 fatty acids 1000 MG capsule Take 1 g by mouth daily.    . fluticasone (FLONASE) 50 MCG/ACT nasal spray SPRAY 2 SPRAYS INTO EACH NOSTRIL EVERY DAY 16 mL 2  . gabapentin (NEURONTIN) 300 MG capsule Take 1 capsule (300 mg total) by mouth 3 (three) times daily. 270 capsule 1  . Glucosamine-Chondroit-Vit C-Mn (GLUCOSAMINE 1500 COMPLEX PO) Glucosamine    . loratadine (CLARITIN) 10 MG tablet TAKE 1 TABLET BY MOUTH EVERY DAY 90 tablet 0  . Melatonin 10 MG CAPS Take 10 mg by mouth at bedtime.     . Misc Natural Products (TART CHERRY ADVANCED PO) Take by mouth daily.    . TURMERIC PO Take by mouth in the morning and at bedtime.    Marland Kitchen Ustekinumab (STELARA [place]) Inject into the skin every 8 (eight) weeks.     No current facility-administered medications on file prior to visit.   Past Medical History:  Diagnosis Date  . BMI 26.0-26.9,adult 07/06/2019  . Bradycardia 08/10/2014  . Crohn's disease ([place])   . DDD (degenerative disc disease), lumbar 07/17/2017  . Diabetes mellitus without  complication ([place])   . Fatigue 08/10/2014  . Hyperlipidemia   . Hyperlipidemia associated with type 2 diabetes mellitus ([place]) 07/06/2019  . Hypertension   . Hypertension, essential   . Mixed hyperlipidemia   . Palpitations 07/06/2019  . Paresthesia 07/06/2019   Past Surgical History:  Procedure Laterality Date  . ABDOMINAL HYSTERECTOMY N/A 10/20/2012   Procedure: HYSTERECTOMY ABDOMINAL;  Surgeon: [name], MD;  Location: [facility];  Service: Gynecology;  Laterality: N/A;  . ANAL FISSURE REPAIR  2002  . APPENDECTOMY    . SALPINGOOPHORECTOMY Bilateral 10/20/2012   Procedure: SALPINGO OOPHORECTOMY;  Surgeon: [name], MD;  Location: [facility];  Service: Gynecology;  Laterality: Bilateral;  . SMALL INTESTINE SURGERY     reconstruction  . TONSILLECTOMY      Family History  Problem Relation Age of Onset  . Heart Problems Mother   . Diabetes Mother   . Diabetes Father   . Heart Problems Father   . Cancer Maternal Grandmother        lung  . Peptic Ulcer Disease Other    Social History   Socioeconomic History  . Marital status: Widowed    Spouse name: Not on file  . Number of children: 1  . Years of education: Not on file  . Highest education level: Not on file  Occupational History  . Not on file  Tobacco Use  . Smoking status: Former Smoker    Quit date: 2002    Years since quitting: 20.2  . Smokeless tobacco: Never Used  Vaping Use  . Vaping Use: Never used  Substance and Sexual Activity  . Alcohol use: Yes  . Drug use: No  . Sexual activity: Not on file  Other Topics Concern  . Not on file  Social History Narrative   wears sunscreen, brushes and flosses daily, see's dentist bi-annually, has smoke/carbon monoxide detectors, wears a seatbelt and practices gun safety   Social Determinants of Health   Financial Resource Strain: Not on file  Food Insecurity: Not on file  Transportation Needs: No Transportation Needs  . Lack of Transportation (Medical): No  . Lack of  Transportation (Non-Medical): No  Physical Activity: Not on file  Stress: Not on file  Social Connections: Not on file    Review of Systems  Constitutional: Negative for chills, fatigue and fever.  HENT: Negative for congestion, ear pain, rhinorrhea and sore throat.   Respiratory: Negative for cough and shortness of breath.   Cardiovascular: Negative for chest pain.   Objective:  BP 136/80   Pulse (!) 59   Temp (!) 97.1 F (36.2 C)   Ht $R'5\' 5"'XR$  (1.651 m)   Wt 169 lb (76.7 kg)   LMP 09/23/2012   SpO2 99%   BMI 28.12 kg/m   BP/Weight 07/25/2020 06/21/2020 0/02/9322  Systolic BP 557 322 025  Diastolic BP 80 74 70  Wt. (Lbs) 169 - 166  BMI 28.12 - 27.62    Physical Exam Vitals reviewed.  Constitutional:      Appearance: Normal appearance. She is normal weight.  Cardiovascular:     Rate and Rhythm: Normal rate and regular rhythm.     Heart sounds: Normal heart sounds.  Pulmonary:     Effort: Pulmonary effort is normal. No respiratory distress.     Breath sounds: Normal breath sounds.  Neurological:     Mental Status: She is alert and oriented to person, place, and time.  Psychiatric:        Mood and Affect: Mood normal.        Behavior: Behavior normal.     Diabetic Foot Exam - Simple   No data filed      Lab Results  Component Value Date   WBC 6.0 06/06/2020   HGB 13.4 06/06/2020   HCT 40.0 06/06/2020   PLT 203 06/06/2020   GLUCOSE 91 06/06/2020   CHOL 250 (H) 06/06/2020   TRIG 132 06/06/2020   HDL 89 06/06/2020   LDLCALC 138 (H) 06/06/2020   ALT 10 06/06/2020   AST 15 06/06/2020   NA 143 06/06/2020   K 4.6 06/06/2020   CL 105 06/06/2020   CREATININE 0.66 06/06/2020   BUN 15 06/06/2020   CO2 24 06/06/2020   TSH 1.990 06/06/2020   INR 0.86 10/20/2012   HGBA1C 5.3 06/06/2020   MICROALBUR 80 07/06/2019      Assessment & Plan:   1. Hypoglycemia due to type 2 diabetes mellitus (HCC)  Recommend 3 meals per day and eat snacks.  Check sugars daily  and if feels poorly  Meds ordered this encounter  Medications  . blood glucose meter kit and supplies KIT    Sig: Check sugars daily as needed. Hypoglycemia e16.2    Dispense:  1 each    Refill:  0    Lancets: 100/3 refills  Glucose strips: 100/3 refills E11.69 diabetes.    Order Specific Question:   Number of strips    Answer:   100    Order Specific Question:   Number of lancets    Answer:   100   Follow-up: Return in about 4 weeks (around 08/22/2020).  An After Visit Summary was printed and given to the patient.  [name], MD [facility] [phone]

## 2020-07-25 ENCOUNTER — Encounter: Payer: Self-pay | Admitting: Family Medicine

## 2020-07-25 ENCOUNTER — Other Ambulatory Visit: Payer: Self-pay

## 2020-07-25 ENCOUNTER — Ambulatory Visit (INDEPENDENT_AMBULATORY_CARE_PROVIDER_SITE_OTHER): Payer: Medicare HMO | Admitting: Family Medicine

## 2020-07-25 VITALS — BP 136/80 | HR 59 | Temp 97.1°F | Ht 65.0 in | Wt 169.0 lb

## 2020-07-25 DIAGNOSIS — E11649 Type 2 diabetes mellitus with hypoglycemia without coma: Secondary | ICD-10-CM | POA: Diagnosis not present

## 2020-07-25 MED ORDER — BLOOD GLUCOSE MONITOR KIT: PACK | 0 refills | Status: DC

## 2020-07-25 MED FILL — STELARA 90 MG/ML SUBCUTANEOUS SYRINGE: SUBCUTANEOUS | 56 days supply | Qty: 1 | Fill #4

## 2020-07-25 NOTE — Patient Instructions (Signed)
Eat 3 meals per day with snacks.  Hypoglycemia Hypoglycemia is when the sugar (glucose) level in your blood is too low. Low blood sugar can happen to people who have diabetes and people who do not have diabetes. Low blood sugar can happen quickly, and it can be an emergency. What are the causes? This condition happens most often in people who have diabetes and may be caused by:  Diabetes medicine.  Not eating enough, or not eating often enough.  Doing more physical activity.  Drinking alcohol on an empty stomach. If you do not have diabetes, hypoglycemia may be caused by:  A tumor in the pancreas.  Not eating enough, or not eating for long periods at a time (fasting).  A very bad infection or illness.  Problems after having weight loss (bariatric) surgery.  Kidney failure or liver failure.  Certain medicines. What increases the risk? This condition is more likely to develop in people who:  Have diabetes and take medicines to lower their blood sugar.  Abuse alcohol.  Have a very bad illness. What are the signs or symptoms? Symptoms depend on whether your low blood sugar is mild, moderate, or very low. Mild  Hunger.  Feeling worried or nervous (anxious).  Sweating and feeling clammy.  Feeling dizzy or light-headed.  Being sleepy or having trouble sleeping.  Feeling like you may vomit (nauseous).  A fast heartbeat.  A headache.  Blurry vision.  Being irritable or grouchy.  Tingling or loss of feeling (numbness) around your mouth, lips, or tongue.  Trouble with moving (coordination). Moderate  Confusion and poor judgment.  Behavior changes.  Weakness.  Uneven heartbeats. Very low Very low blood sugar (severe hypoglycemia) is a medical emergency. It can cause:  Fainting.  Jerky movements that you cannot control (seizure).  Loss of consciousness (coma).  Death. How is this treated? Treating low blood sugar Low blood sugar is often treated  by eating or drinking something sugary right away. The snack should contain 15 grams of a fast-acting carb (carbohydrate). Options include:  4 oz (120 mL) of fruit juice.  4-6 oz (120-150 mL) of regular soda (not diet soda).  8 oz (240 mL) of low-fat milk.  Several pieces of hard candy. Check food labels to find out how many to eat for 15 grams.  1 Tbsp (15 mL) of sugar or honey. Treating low blood sugar if you have diabetes If you can think clearly and swallow safely, follow the 15:15 rule:  Take 15 grams of a fast-acting carb. Talk with your doctor about how much you should take.  Always keep a source of fast-acting carb with you, such as: ? Sugar tablets (glucose pills). Take 4 pills. ? Several pieces of hard candy. Check food labels to see how many pieces to eat for 15 grams. ? 4 oz (120 mL) of fruit juice. ? 4-6 oz (120-150 mL) of regular (not diet) soda. ? 1 Tbsp (15 mL) of honey or sugar.  Check your blood sugar 15 minutes after you take the carb.  If your blood sugar is still at or below 70 mg/dL (3.9 mmol/L), take 15 grams of a carb again.  If your blood sugar does not go above 70 mg/dL (3.9 mmol/L) after 3 tries, get help right away.  After your blood sugar goes back to normal, eat a meal or a snack within 1 hour.   Treating very low blood sugar If your blood sugar is at or below 54 mg/dL (3 mmol/L),  you have very low blood sugar, or severe hypoglycemia. This is an emergency. Get medical help right away. If you have very low blood sugar and you cannot eat or drink, you will need to be given a hormone called glucagon. A family member or friend should learn how to check your blood sugar and how to give you glucagon. Ask your doctor if you need to have an emergency glucagon kit at home. Very low blood sugar may also need to be treated in a hospital. Follow these instructions at home: General instructions  Take over-the-counter and prescription medicines only as told by  your doctor.  Stay aware of your blood sugar as told by your doctor.  If you drink alcohol: ? Limit how much you use to:  0-1 drink a day for nonpregnant women.  0-2 drinks a day for men. ? Be aware of how much alcohol is in your drink. In the [place] one drink equals one 12 oz bottle of beer (355 mL), one 5 oz glass of wine (148 mL), or one 1 oz glass of hard liquor (44 mL).  Keep all follow-up visits as told by your doctor. This is important. If you have diabetes:  Always have a rapid-acting carb (15 grams) option with you to treat low blood sugar.  Follow your diabetes care plan as told by your doctor. Make sure you: ? Know the symptoms of low blood sugar. ? Check your blood sugar as often as told by your doctor. Always check it before and after exercise. ? Always check your blood sugar before you drive. ? Take your medicines as told. ? Follow your meal plan. ? Eat on time. Do not skip meals.  Share your diabetes care plan with: ? Your work or school. ? People you live with.  Carry a card or wear jewelry that says you have diabetes.   Contact a doctor if:  You have trouble keeping your blood sugar in your target range.  You have low blood sugar often. Get help right away if:  You still have symptoms after you eat or drink something that contains 15 grams of fast-acting carb and you cannot get your blood sugar above 70 mg/dL by following the 15:15 rule.  Your blood sugar is at or below 54 mg/dL (3 mmol/L).  You have a seizure.  You faint. These symptoms may be an emergency. Do not wait to see if the symptoms will go away. Get medical help right away. Call your local emergency services (911 in the [place] Do not drive yourself to the hospital. Summary  Hypoglycemia happens when the level of sugar (glucose) in your blood is too low.  Low blood sugar can happen to people who have diabetes and people who do not have diabetes. Low blood sugar can happen quickly, and it can  be an emergency.  Make sure you know the symptoms of low blood sugar and know how to treat it.  Always keep a source of sugar (fast-acting carb) with you to treat low blood sugar. This information is not intended to replace advice given to you by your health care provider. Make sure you discuss any questions you have with your health care provider. Document Revised: 03/24/2019 Document Reviewed: 03/24/2019 Elsevier Patient Education  2021 [employer].

## 2020-07-31 ENCOUNTER — Other Ambulatory Visit: Payer: Self-pay | Admitting: Family Medicine

## 2020-08-02 ENCOUNTER — Other Ambulatory Visit: Payer: Self-pay

## 2020-08-02 MED ORDER — EZETIMIBE 10 MG PO TABS: 10.0000 mg | ORAL_TABLET | Freq: Every day | ORAL | 0 refills | Status: DC

## 2020-08-22 ENCOUNTER — Other Ambulatory Visit: Payer: Self-pay

## 2020-08-22 MED ORDER — EZETIMIBE 10 MG PO TABS: 10.0000 mg | ORAL_TABLET | Freq: Every day | ORAL | 0 refills | Status: DC

## 2020-08-23 ENCOUNTER — Ambulatory Visit: Payer: Medicare HMO | Admitting: Family Medicine

## 2020-08-24 ENCOUNTER — Other Ambulatory Visit: Payer: Self-pay

## 2020-08-24 MED ORDER — EZETIMIBE 10 MG PO TABS: 10.0000 mg | ORAL_TABLET | Freq: Every day | ORAL | 0 refills | Status: DC

## 2020-08-25 ENCOUNTER — Ambulatory Visit (INDEPENDENT_AMBULATORY_CARE_PROVIDER_SITE_OTHER): Payer: Medicare HMO | Admitting: Physician Assistant

## 2020-08-25 ENCOUNTER — Encounter: Payer: Self-pay | Admitting: Physician Assistant

## 2020-08-25 ENCOUNTER — Other Ambulatory Visit: Payer: Self-pay

## 2020-08-25 VITALS — BP 118/68 | HR 55 | Temp 97.1°F | Ht 65.0 in | Wt 168.0 lb

## 2020-08-25 DIAGNOSIS — G609 Hereditary and idiopathic neuropathy, unspecified: Secondary | ICD-10-CM

## 2020-08-25 DIAGNOSIS — E782 Mixed hyperlipidemia: Secondary | ICD-10-CM

## 2020-08-25 DIAGNOSIS — K5080 Crohn's disease of both small and large intestine without complications: Secondary | ICD-10-CM | POA: Diagnosis not present

## 2020-08-25 DIAGNOSIS — E559 Vitamin D deficiency, unspecified: Secondary | ICD-10-CM

## 2020-08-25 DIAGNOSIS — R7303 Prediabetes: Secondary | ICD-10-CM | POA: Diagnosis not present

## 2020-08-25 LAB — POCT UA - MICROALBUMIN: Microalbumin Ur, POC: 30 mg/L

## 2020-08-25 MED ORDER — EZETIMIBE 10 MG PO TABS: 10.0000 mg | ORAL_TABLET | Freq: Every day | ORAL | 1 refills | Status: DC

## 2020-08-25 NOTE — Progress Notes (Signed)
Subjective:  Patient ID: [name], female    DOB: Sep 22, 1965  Age: 55 y.o. MRN: [id number]  Chief Complaint  Patient presents with  . Hyperlipidemia    Started on zetia   . Diabetes    HPI  Mixed hyperlipidemia  Pt presents with hyperlipidemia.  Compliance with treatment has been good The patient is compliant with medications, maintains a low cholesterol diet , follows up as directed , and maintains an exercise regimen . The patient denies experiencing any hypercholesterolemia related symptoms. She is currently on zetia $Remove'10mg'wuwQSZc$  and fish oil  Pt with history of prediabetes - last hgb a1c was 5.3 - she had elevated level at one time but changed diet and exercise and has not had to go on medication - glucose has ranged between 80s fasting and around 130s after eating  Pt with history of Crohns disease - she is currently on Stelara and follows with Dr Gale Journey every 6 months - states she is doing well on that medication  Pt with history of idiopathic neuropathy - has increased gabapentin to 3 tablets at night and states that helps her symptoms  Current Outpatient Medications on File Prior to Visit  Medication Sig Dispense Refill  . Biotin 1000 MCG tablet Take 1,000 mcg by mouth daily.    . blood glucose meter kit and supplies KIT Check sugars daily as needed. Hypoglycemia e16.2 1 each 0  . calcium-vitamin D (OSCAL WITH D) 500-200 MG-UNIT per tablet Take 1 tablet by mouth daily.    . cholecalciferol (VITAMIN D3) 25 MCG (1000 UNIT) tablet Take 1,000 Units by mouth daily.    Marland Kitchen estradiol (ESTRACE) 1 MG tablet TAKE 1 TABLET EVERY DAY 90 tablet 3  . fish oil-omega-3 fatty acids 1000 MG capsule Take 1 g by mouth daily.    . fluticasone (FLONASE) 50 MCG/ACT nasal spray SPRAY 2 SPRAYS INTO EACH NOSTRIL EVERY DAY 16 mL 2  . gabapentin (NEURONTIN) 300 MG capsule Take 1 capsule (300 mg total) by mouth 3 (three) times daily. 270 capsule 1  . Glucosamine-Chondroit-Vit C-Mn (GLUCOSAMINE 1500 COMPLEX PO)  Glucosamine    . loratadine (CLARITIN) 10 MG tablet TAKE 1 TABLET BY MOUTH EVERY DAY 90 tablet 0  . Melatonin 10 MG CAPS Take 10 mg by mouth at bedtime.     . Misc Natural Products (TART CHERRY ADVANCED PO) Take by mouth daily.    . TURMERIC PO Take by mouth in the morning and at bedtime.    Marland Kitchen Ustekinumab (STELARA [place]) Inject into the skin every 8 (eight) weeks.     No current facility-administered medications on file prior to visit.   Past Medical History:  Diagnosis Date  . BMI 26.0-26.9,adult 07/06/2019  . Bradycardia 08/10/2014  . Crohn's disease ([place])   . DDD (degenerative disc disease), lumbar 07/17/2017  . Diabetes mellitus without complication ([place])   . Fatigue 08/10/2014  . Hyperlipidemia   . Hyperlipidemia associated with type 2 diabetes mellitus ([place]) 07/06/2019  . Hypertension   . Hypertension, essential   . Mixed hyperlipidemia   . Palpitations 07/06/2019  . Paresthesia 07/06/2019   Past Surgical History:  Procedure Laterality Date  . ABDOMINAL HYSTERECTOMY N/A 10/20/2012   Procedure: HYSTERECTOMY ABDOMINAL;  Surgeon: [name], MD;  Location: [facility];  Service: Gynecology;  Laterality: N/A;  . ANAL FISSURE REPAIR  2002  . APPENDECTOMY    . SALPINGOOPHORECTOMY Bilateral 10/20/2012   Procedure: SALPINGO OOPHORECTOMY;  Surgeon: [name], MD;  Location: [facility];  Service: Gynecology;  Laterality: Bilateral;  . SMALL INTESTINE SURGERY     reconstruction  . TONSILLECTOMY      Family History  Problem Relation Age of Onset  . Heart Problems Mother   . Diabetes Mother   . Diabetes Father   . Heart Problems Father   . Cancer Maternal Grandmother        lung  . Peptic Ulcer Disease Other    Social History   Socioeconomic History  . Marital status: Widowed    Spouse name: Not on file  . Number of children: 1  . Years of education: Not on file  . Highest education level: Not on file  Occupational History  . Not on file  Tobacco Use  . Smoking status: Former Smoker     Quit date: 2002    Years since quitting: 20.2  . Smokeless tobacco: Never Used  Vaping Use  . Vaping Use: Never used  Substance and Sexual Activity  . Alcohol use: Yes  . Drug use: No  . Sexual activity: Not on file  Other Topics Concern  . Not on file  Social History Narrative   wears sunscreen, brushes and flosses daily, see's dentist bi-annually, has smoke/carbon monoxide detectors, wears a seatbelt and practices gun safety   Social Determinants of Health   Financial Resource Strain: Not on file  Food Insecurity: Not on file  Transportation Needs: No Transportation Needs  . Lack of Transportation (Medical): No  . Lack of Transportation (Non-Medical): No  Physical Activity: Not on file  Stress: Not on file  Social Connections: Not on file    Review of Systems CONSTITUTIONAL: Negative for chills, fatigue, fever, unintentional weight gain and unintentional weight loss.  E/N/T: Negative for ear pain, nasal congestion and sore throat.  CARDIOVASCULAR: Negative for chest pain, dizziness, palpitations and pedal edema.  RESPIRATORY: Negative for recent cough and dyspnea.  GASTROINTESTINAL: see HPI MSK: Negative for arthralgias and myalgias.  INTEGUMENTARY: Negative for rash.  NEUROLOGICAL:see HPI PSYCHIATRIC: Negative for sleep disturbance and to question depression screen.  Negative for depression, negative for anhedonia.       Objective:  BP 118/68   Pulse (!) 55   Temp (!) 97.1 F (36.2 C)   Ht 5\' 5"  (1.651 m)   Wt 168 lb (76.2 kg)   LMP 09/23/2012   SpO2 99%   BMI 27.96 kg/m   BP/Weight 08/25/2020 07/25/2020 06/21/2020  Systolic BP 118 136 128  Diastolic BP 68 80 74  Wt. (Lbs) 168 169 -  BMI 27.96 28.12 -    Physical Exam PHYSICAL EXAM:   VS: BP 118/68   Pulse (!) 55   Temp (!) 97.1 F (36.2 C)   Ht 5\' 5"  (1.651 m)   Wt 168 lb (76.2 kg)   LMP 09/23/2012   SpO2 99%   BMI 27.96 kg/m   GEN: Well nourished, well developed, in no acute distress  Cardiac:  RRR; no murmurs, rubs, or gallops,no edema -  Respiratory:  normal respiratory rate and pattern with no distress - normal breath sounds with no rales, rhonchi, wheezes or rubs GI: normal bowel sounds, no masses or tenderness MS: no deformity or atrophy  Skin: warm and dry, no rash  Neuro:  Alert and Oriented x 3, Strength and sensation are intact - CN II-Xii grossly intact Psych: euthymic mood, appropriate affect and demeanor  Office Visit on 08/25/2020  Component Date Value Ref Range Status  . Microalbumin Ur, POC 08/25/2020 30  mg/L Final    Diabetic Foot Exam - Simple   No data filed      Lab Results  Component Value Date   WBC 6.0 06/06/2020   HGB 13.4 06/06/2020   HCT 40.0 06/06/2020   PLT 203 06/06/2020   GLUCOSE 91 06/06/2020   CHOL 250 (H) 06/06/2020   TRIG 132 06/06/2020   HDL 89 06/06/2020   LDLCALC 138 (H) 06/06/2020   ALT 10 06/06/2020   AST 15 06/06/2020   NA 143 06/06/2020   K 4.6 06/06/2020   CL 105 06/06/2020   CREATININE 0.66 06/06/2020   BUN 15 06/06/2020   CO2 24 06/06/2020   TSH 1.990 06/06/2020   INR 0.86 10/20/2012   HGBA1C 5.3 06/06/2020   MICROALBUR 30 08/25/2020      Assessment & Plan:   1. Prediabetes - POCT UA - Microalbumin Continue to watch diet labwork pending 2. Mixed hyperlipidemia - ezetimibe (ZETIA) 10 MG tablet; Take 1 tablet (10 mg total) by mouth daily.  Dispense: 90 tablet; Refill: 1 Continue to watch diet labwork pending 3. Idiopathic neuropathy Continue current meds as directed 4. Crohn's disease of both small and large intestine without complication ([place])  continue follow up with Dr Gale Journey and continue meds  Meds ordered this encounter  Medications  . ezetimibe (ZETIA) 10 MG tablet    Sig: Take 1 tablet (10 mg total) by mouth daily.    Dispense:  90 tablet    Refill:  1    Orders Placed This Encounter  Procedures  . CBC with Differential/Platelet  . Comprehensive metabolic panel  . TSH  . Lipid panel  .  Hemoglobin A1c  . VITAMIN D 25 Hydroxy (Vit-D Deficiency, Fractures)  . POCT UA - Microalbumin     Follow-up: Return in about 6 months (around 02/24/2021) for chronic fasting follow up with Dr Tobie Poet.  An After Visit Summary was printed and given to the patient.  Yetta Flock [name] [phone]

## 2020-08-26 LAB — HEMOGLOBIN A1C
Est. average glucose Bld gHb Est-mCnc: 114 mg/dL
Hgb A1c MFr Bld: 5.6 % (ref 4.8–5.6)

## 2020-08-26 LAB — CBC WITH DIFFERENTIAL/PLATELET
Basophils Absolute: 0.1 10*3/uL (ref 0.0–0.2)
Basos: 1 %
EOS (ABSOLUTE): 0.1 10*3/uL (ref 0.0–0.4)
Eos: 1 %
Hematocrit: 42.5 % (ref 34.0–46.6)
Hemoglobin: 14 g/dL (ref 11.1–15.9)
Immature Grans (Abs): 0 10*3/uL (ref 0.0–0.1)
Immature Granulocytes: 0 %
Lymphocytes Absolute: 1.8 10*3/uL (ref 0.7–3.1)
Lymphs: 33 %
MCH: 29.7 pg (ref 26.6–33.0)
MCHC: 32.9 g/dL (ref 31.5–35.7)
MCV: 90 fL (ref 79–97)
Monocytes Absolute: 0.4 10*3/uL (ref 0.1–0.9)
Monocytes: 7 %
Neutrophils Absolute: 3.2 10*3/uL (ref 1.4–7.0)
Neutrophils: 58 %
Platelets: 202 10*3/uL (ref 150–450)
RBC: 4.71 x10E6/uL (ref 3.77–5.28)
RDW: 12.7 % (ref 11.7–15.4)
WBC: 5.5 10*3/uL (ref 3.4–10.8)

## 2020-08-26 LAB — COMPREHENSIVE METABOLIC PANEL
ALT: 9 IU/L (ref 0–32)
AST: 14 IU/L (ref 0–40)
Albumin/Globulin Ratio: 1.8 (ref 1.2–2.2)
Albumin: 4.4 g/dL (ref 3.8–4.9)
Alkaline Phosphatase: 60 IU/L (ref 44–121)
BUN/Creatinine Ratio: 23 (ref 9–23)
BUN: 16 mg/dL (ref 6–24)
Bilirubin Total: 0.6 mg/dL (ref 0.0–1.2)
CO2: 25 mmol/L (ref 20–29)
Calcium: 9.9 mg/dL (ref 8.7–10.2)
Chloride: 103 mmol/L (ref 96–106)
Creatinine, Ser: 0.69 mg/dL (ref 0.57–1.00)
Globulin, Total: 2.5 g/dL (ref 1.5–4.5)
Glucose: 93 mg/dL (ref 65–99)
Potassium: 4.7 mmol/L (ref 3.5–5.2)
Sodium: 142 mmol/L (ref 134–144)
Total Protein: 6.9 g/dL (ref 6.0–8.5)
eGFR: 102 mL/min/{1.73_m2} (ref >59)

## 2020-08-26 LAB — LIPID PANEL
Chol/HDL Ratio: 2.5 ratio (ref 0.0–4.4)
Cholesterol, Total: 234 mg/dL — ABNORMAL HIGH (ref 100–199)
HDL: 95 mg/dL (ref >39)
LDL Chol Calc (NIH): 124 mg/dL — ABNORMAL HIGH (ref 0–99)
Triglycerides: 90 mg/dL (ref 0–149)
VLDL Cholesterol Cal: 15 mg/dL (ref 5–40)

## 2020-08-26 LAB — TSH: TSH: 1.33 u[IU]/mL (ref 0.450–4.500)

## 2020-08-26 LAB — VITAMIN D 25 HYDROXY (VIT D DEFICIENCY, FRACTURES): Vit D, 25-Hydroxy: 43.9 ng/mL (ref 30.0–100.0)

## 2020-08-26 LAB — CARDIOVASCULAR RISK ASSESSMENT

## 2020-08-28 ENCOUNTER — Ambulatory Visit (INDEPENDENT_AMBULATORY_CARE_PROVIDER_SITE_OTHER): Payer: Medicare HMO | Admitting: Family Medicine

## 2020-08-28 ENCOUNTER — Other Ambulatory Visit: Payer: Self-pay

## 2020-08-28 VITALS — BP 132/74 | HR 52 | Temp 97.7°F | Resp 16 | Ht 65.5 in | Wt 169.2 lb

## 2020-08-28 DIAGNOSIS — L03114 Cellulitis of left upper limb: Secondary | ICD-10-CM | POA: Diagnosis not present

## 2020-08-28 MED ORDER — CEFTRIAXONE SODIUM 1 G IJ SOLR
1.0000 g | Freq: Once | INTRAMUSCULAR | Status: AC
Start: 2020-08-28 — End: 2020-08-28
  Administered 2020-08-28: 1 g via INTRAMUSCULAR

## 2020-08-28 MED ORDER — SULFAMETHOXAZOLE-TRIMETHOPRIM 800-160 MG PO TABS: 1.0000 | ORAL_TABLET | Freq: Two times a day (BID) | ORAL | 0 refills | Status: DC

## 2020-08-28 NOTE — Progress Notes (Signed)
Acute Office Visit  Subjective:    Patient ID: Brittany [name], female    DOB: 08-Dec-1965, 55 y.o.   MRN: [id number]  Chief Complaint  Patient presents with  . Insect Bite    HPI Patient is in today for skin infection. Pt was at a church picnic and noticed a bite later. Tender and itchy. No fever, but hot to touch.  Past Medical History:  Diagnosis Date  . BMI 26.0-26.9,adult 07/06/2019  . Bradycardia 08/10/2014  . Crohn's disease ([place])   . DDD (degenerative disc disease), lumbar 07/17/2017  . Diabetes mellitus without complication ([place])   . Fatigue 08/10/2014  . Hyperlipidemia   . Hyperlipidemia associated with type 2 diabetes mellitus ([place]) 07/06/2019  . Hypertension   . Hypertension, essential   . Mixed hyperlipidemia   . Palpitations 07/06/2019  . Paresthesia 07/06/2019    Past Surgical History:  Procedure Laterality Date  . ABDOMINAL HYSTERECTOMY N/A 10/20/2012   Procedure: HYSTERECTOMY ABDOMINAL;  Surgeon: [name], MD;  Location: [facility];  Service: Gynecology;  Laterality: N/A;  . ANAL FISSURE REPAIR  2002  . APPENDECTOMY    . SALPINGOOPHORECTOMY Bilateral 10/20/2012   Procedure: SALPINGO OOPHORECTOMY;  Surgeon: [name], MD;  Location: [facility];  Service: Gynecology;  Laterality: Bilateral;  . SMALL INTESTINE SURGERY     reconstruction  . TONSILLECTOMY      Family History  Problem Relation Age of Onset  . Heart Problems Mother   . Diabetes Mother   . Diabetes Father   . Heart Problems Father   . Cancer Maternal Grandmother        lung  . Peptic Ulcer Disease Other     Social History   Socioeconomic History  . Marital status: Widowed    Spouse name: Not on file  . Number of children: 1  . Years of education: Not on file  . Highest education level: Not on file  Occupational History  . Not on file  Tobacco Use  . Smoking status: Former Smoker    Quit date: 2002    Years since quitting: 20.3  . Smokeless tobacco: Never Used  Vaping Use  . Vaping Use:  Never used  Substance and Sexual Activity  . Alcohol use: Yes  . Drug use: No  . Sexual activity: Not on file  Other Topics Concern  . Not on file  Social History Narrative   wears sunscreen, brushes and flosses daily, see's dentist bi-annually, has smoke/carbon monoxide detectors, wears a seatbelt and practices gun safety   Social Determinants of Health   Financial Resource Strain: Not on file  Food Insecurity: Not on file  Transportation Needs: No Transportation Needs  . Lack of Transportation (Medical): No  . Lack of Transportation (Non-Medical): No  Physical Activity: Not on file  Stress: Not on file  Social Connections: Not on file  Intimate Partner Violence: Not on file    Outpatient Medications Prior to Visit  Medication Sig Dispense Refill  . Biotin 1000 MCG tablet Take 1,000 mcg by mouth daily.    . blood glucose meter kit and supplies KIT Check sugars daily as needed. Hypoglycemia e16.2 1 each 0  . calcium-vitamin D (OSCAL WITH D) 500-200 MG-UNIT per tablet Take 1 tablet by mouth daily.    . cholecalciferol (VITAMIN D3) 25 MCG (1000 UNIT) tablet Take 1,000 Units by mouth daily.    Marland Kitchen estradiol (ESTRACE) 1 MG tablet TAKE 1 TABLET EVERY DAY 90 tablet 3  . ezetimibe (  ZETIA) 10 MG tablet Take 1 tablet (10 mg total) by mouth daily. 90 tablet 1  . fish oil-omega-3 fatty acids 1000 MG capsule Take 1 g by mouth daily.    . fluticasone (FLONASE) 50 MCG/ACT nasal spray SPRAY 2 SPRAYS INTO EACH NOSTRIL EVERY DAY 16 mL 2  . gabapentin (NEURONTIN) 300 MG capsule Take 1 capsule (300 mg total) by mouth 3 (three) times daily. 270 capsule 1  . Glucosamine-Chondroit-Vit C-Mn (GLUCOSAMINE 1500 COMPLEX PO) Glucosamine    . loratadine (CLARITIN) 10 MG tablet TAKE 1 TABLET BY MOUTH EVERY DAY 90 tablet 0  . Melatonin 10 MG CAPS Take 10 mg by mouth at bedtime.     . Misc Natural Products (TART CHERRY ADVANCED PO) Take by mouth daily.    . TURMERIC PO Take by mouth in the morning and at  bedtime.    Marland Kitchen Ustekinumab (STELARA [place]) Inject into the skin every 8 (eight) weeks.     No facility-administered medications prior to visit.    Allergies  Allergen Reactions  . Mercaptopurine     Used to treat pt. Crohn's Disease.  Caused Pancreatis 1992.  . Amitriptyline   . Crestor [Rosuvastatin Calcium]     Muscle pain  . Metoprolol Other (See Comments)    Extreme fatigue Extreme fatigue   . Trazodone And Nefazodone   . Zocor [Simvastatin]   . Morphine And Related Itching  . Penicillins Rash    Review of Systems  Constitutional: Negative for chills and fever.       Objective:    Physical Exam Vitals reviewed.  Constitutional:      Appearance: Normal appearance.  Skin:    Findings: Lesion (lesion, with erythem. no pustules.) present.  Neurological:     Mental Status: She is alert.  Psychiatric:        Mood and Affect: Mood normal.     BP 132/74   Pulse (!) 52   Temp 97.7 F (36.5 C)   Resp 16   Ht 5' 5.5" (1.664 m)   Wt 169 lb 3.2 oz (76.7 kg)   LMP 09/23/2012   BMI 27.73 kg/m  Wt Readings from Last 3 Encounters:  08/28/20 169 lb 3.2 oz (76.7 kg)  08/25/20 168 lb (76.2 kg)  07/25/20 169 lb (76.7 kg)    Health Maintenance Due  Topic Date Due  . TETANUS/TDAP  Never done  . OPHTHALMOLOGY EXAM  06/18/2019  . COVID-19 Vaccine (3 - Booster for Pfizer series) 02/24/2020    There are no preventive care reminders to display for this patient.   Lab Results  Component Value Date   TSH 1.330 08/25/2020   Lab Results  Component Value Date   WBC 5.5 08/25/2020   HGB 14.0 08/25/2020   HCT 42.5 08/25/2020   MCV 90 08/25/2020   PLT 202 08/25/2020   Lab Results  Component Value Date   NA 142 08/25/2020   K 4.7 08/25/2020   CO2 25 08/25/2020   GLUCOSE 93 08/25/2020   BUN 16 08/25/2020   CREATININE 0.69 08/25/2020   BILITOT 0.6 08/25/2020   ALKPHOS 60 08/25/2020   AST 14 08/25/2020   ALT 9 08/25/2020   PROT 6.9 08/25/2020   ALBUMIN 4.4  08/25/2020   CALCIUM 9.9 08/25/2020   EGFR 102 08/25/2020   Lab Results  Component Value Date   CHOL 234 (H) 08/25/2020   Lab Results  Component Value Date   HDL 95 08/25/2020   Lab Results  Component  Value Date   LDLCALC 124 (H) 08/25/2020   Lab Results  Component Value Date   TRIG 90 08/25/2020   Lab Results  Component Value Date   CHOLHDL 2.5 08/25/2020   Lab Results  Component Value Date   HGBA1C 5.6 08/25/2020       Assessment & Plan:  1. Cellulitis of left arm - cefTRIAXone (ROCEPHIN) injection 1 g  Bactrim DS I po bid x 10 days  Meds ordered this encounter  Medications  . sulfamethoxazole-trimethoprim (BACTRIM DS) 800-160 MG tablet    Sig: Take 1 tablet by mouth 2 (two) times daily.    Dispense:  10 tablet    Refill:  0  . cefTRIAXone (ROCEPHIN) injection 1 g    Follow-up: No follow-ups on file.  An After Visit Summary was printed and given to the patient.  [name], MD [facility] [phone]

## 2020-09-14 ENCOUNTER — Encounter: Payer: Self-pay | Admitting: Family Medicine

## 2020-09-14 NOTE — Unmapped (Signed)
[facility] Refill Coordination Note    Specialty Medication(s) to be Shipped:   Inflammatory Disorders: Stelara 90mg /ml  Other medication(s) to be shipped: No additional medications requested for fill at this time     Brenda Beard, DOB: 10-30-1965  Phone: [phone] (home)     All above HIPAA information was verified with patient.     Was a Nurse, learning disability used for this call? No    Completed refill call assessment today to schedule patient's medication shipment from the [facility] [phone]).  All relevant notes have been reviewed.     Specialty medication(s) and dose(s) confirmed: Regimen is correct and unchanged.   Changes to medications: [name] reports no changes at this time.  Changes to insurance: No  New side effects reported not previously addressed with a pharmacist or physician: None reported  Questions for the pharmacist: No    Confirmed patient received a Conservation officer, historic buildings and a Surveyor, mining with first shipment. The patient will receive a drug information handout for each medication shipped and additional FDA Medication Guides as required.       DISEASE/MEDICATION-SPECIFIC INFORMATION        For patients on injectable medications: Patient currently has 0 doses left.  Next injection is scheduled for 10/01/2020.    SPECIALTY MEDICATION ADHERENCE     Medication Adherence    Patient reported X missed doses in the last month: 0  Specialty Medication: Stelara 90mg /ml  Patient is on additional specialty medications: No  Patient is on more than two specialty medications: No  Informant: patient  Reliability of informant: reliable  Reasons for non-adherence: no problems identified        Were doses missed due to medication being on hold? No    REFERRAL TO PHARMACIST     Referral to the pharmacist: Not needed    [facility]     Shipping address confirmed in Epic.     Delivery Scheduled: Yes, Expected medication delivery date: 09/21/2020.     Medication will be delivered via UPS to the prescription address in Epic WAM.    Brenda Beard Shared [facility] Specialty Technician

## 2020-09-20 MED FILL — STELARA 90 MG/ML SUBCUTANEOUS SYRINGE: SUBCUTANEOUS | 56 days supply | Qty: 1 | Fill #5

## 2020-09-25 ENCOUNTER — Other Ambulatory Visit: Payer: Self-pay

## 2020-09-25 MED ORDER — ACCU-CHEK GUIDE VI STRP: ORAL_STRIP | 12 refills | Status: DC

## 2020-10-03 ENCOUNTER — Ambulatory Visit: Admit: 2020-10-03 | Discharge: 2020-10-04 | Payer: MEDICARE | Attending: Internal Medicine | Primary: Internal Medicine

## 2020-10-03 DIAGNOSIS — K5090 Crohn's disease, unspecified, without complications: Secondary | ICD-10-CM | POA: Diagnosis not present

## 2020-10-03 DIAGNOSIS — Z7185 Encounter for immunization safety counseling: Secondary | ICD-10-CM | POA: Diagnosis not present

## 2020-10-03 DIAGNOSIS — Z79899 Other long term (current) drug therapy: Secondary | ICD-10-CM | POA: Diagnosis not present

## 2020-10-03 DIAGNOSIS — K50919 Crohn's disease, unspecified, with unspecified complications: Principal | ICD-10-CM

## 2020-10-03 MED ORDER — STELARA 90 MG/ML SUBCUTANEOUS SYRINGE
SUBCUTANEOUS | 5 refills | 56 days | Status: CP
Start: 2020-10-03 — End: 2021-10-03
  Filled 2020-11-21: qty 1, 56d supply, fill #0

## 2020-10-03 NOTE — Unmapped (Signed)
[facility]  CONSULT NOTE - INFLAMMATORY BOWEL DISEASE  10/03/2020    Demographics:  Brenda Beard is a 55 y.o. year old female    Referring physician:   Jules Husbands, MD  [name]  [name]  [place],  [place] [address]          HPI / NOTE :     Today, I saw Brenda Beard for follow up consultation in the [facility] at the request of Dr. Sedalia Muta regarding Crohn's Disease.  Prior records including available clinical notes, endoscopy reports, imaging results and pathology results were reviewed in detail as part of this follow up consultation.     CC: f/u Crohn's     HPI:  Brenda Beard is a 55 y.o. woman with long-standing ileocolonic Crohn's disease as well as some rectal issues who is seen in follow up.    She has been doing well since her last visit. She is recently engaged (to deceased husband's second cousin) and they will be planning a wedding soon. She has been able to get to the beach at [facility] at least once a month so this has been great for her.     Her Crohn's symptoms have been well controlled. She just took her Stelara yesterday - every once in a while, gets a small knot in perianal area - not painful but notices that it's there; smaller than a pea - thinks this occurs when close to being due for her Stelara but has not needed to take antibiotics. She denies any current issues in the perianal area.     Denies any abd pain, N/V. BMs are variable - 0-3 BMs/day depending on what eats; no blood   Cole slaw and broccoli don't agree with her and has to be careful with salads. Has not needed any Miralax to keep BMs moving.     She does have a question about if her dry hair may be related to Stelara use. She has noticed some hair breakage due to her dry hair and her hair dresser recommended starting biotin supplement which has been somewhat helpful but hasn't taken away the issue. She wonders if this could be a menopausal symptom as well.     Had labs 08/25/20 with her PCP.     Had COVID x 2; 2nd round was very mild with sinus symptoms.     Review of Systems: positive as per HPI   Otherwise, the balance of 10 systems is negative.          IBD HISTORY:     Year of disease onset:  1989  Diagnosis:  Crohn's Disease  Age at onset:   61-40 yr old (A2)  Location:  Ileocolonic (L3)  Behavior:  Penetrating (B3)  - per Dr. Erma Heritage notes, I do not have access to these earlier records   Perianal Dz:  Yes    Brief IBD Disease Course:    1989 diagnosis ileal Crohn's disease. Ultimately progressed to ileocolonic disease with perineal involvement with recurrent perirectal abscesses. 2000 ileocolic resection. Prior treatment with 6-MP resulting in pancreatitis, Cellcept. 2002 start Remicade which she remained on for approximately 10 years, she did have arthralgias on Remicade. 09/2011 insurance issues no longer able to get Remicade. 02/2012 enrolled in ustekinumab trial from 2014 - 2018. 06/2016 rectal abscess which spontaneously drained. 03/2017 colon with endoscopic remission. 2019 commercial ustekinumab started. 02/2019 ?perirectal abscess that spontaneous drained, treated with cipro; remains on ustekinumab.  03/2019 COVID positive. 04/2019 perirectal abscess treated with cipro/flagyl. 03/2020 clinical improvement in symptoms with reduced psychosocial stressors. 06/2020 COVID infection (second time, this time sinusitis symptoms). 09/2020 newly engaged, clinical remission on ustekinumab.    *Reports prior history of neuropathy with flagyl  *History of hysterectomy      Endoscopy:      Colon 03/13/17: Patent ileocolonic anastomosis. Diverticulosis in sigmoid colon and descending colon. Nodule in descending colon. Biopsied. Excess tissue in rectum raising question of rectal prolapse.    Colon 03/17/13: Anal canal stenosis. Patent ileocolonic anastomosis. Diverticulosis in left colon. 4 mm polyp in descending colon. Otherwise normal.    Colon 04/30/12: Anal canal stenosis, dilated to 18 mm with Hegar dilator. Aphthous ulceration in TI. Marland Kitchen Moderately congested mucosa in rectosigmoid. Patent ileocolonic anastomosis in ascending colon.     Colon 03/05/12: Anal stricture. Diverticulosis in left colon. Erythematous mucosa in the rectum. Aphthae in rectum. Patent anastomosis.     Colon 08/24/09: Nodule in rectum, biopsied. Diverticulosis left colon, transverse colon. Patent ileocolonic anastomosis.     Colon 03/29/05: Ileocolonic anastomosis in ascending colon. Otherwise normal colon.     Imaging:    Colon 02/28/03: Normal TI. Ileocolonic anastomosis in ascending colon. Normal colon.    Colon 10/30/00: Featureless mucosa in rectum. Ulceration at the ileocolonic anastomosis that was superficial and circumferential. Neoterminal ileum with a few scattered aphthous ulcerations.     Prior IBD medications (type, dose, duration, response):  - 6-MP: pancreatitis  - Remicade: treated for many years until insurance stopped covering, did have arthralgias on this therapy as well  - Cellcept: details not known  - Stelara: [facility] clinical trial as well as commercial product           Past Medical History:   Past medical history:   Past Medical History:   Diagnosis Date   ??? Constipation    ??? Crohn's disease (CMS-HCC)    ??? DDD (degenerative disc disease), lumbar    ??? Diabetes mellitus (CMS-HCC)     Borderline, doesn't take meds and doesn't check sugars.  Diet managed.   ??? H/O hypotension     States occurred post hysterectomy and colon surgery.   ??? Heart murmur    ??? High cholesterol    ??? Hypertension    ??? Incomplete defecation     intermittent   ??? Joint pain    ??? Rectocele    ??? Straining during bowel movements      Past surgical history:   Past Surgical History:   Procedure Laterality Date   ??? ANAL FISSURECTOMY  2007    [place]   ??? APPENDECTOMY      Childhood   ??? BACK SURGERY     ??? COLON SURGERY  1992 and 1999    small intestine-total 20 inches removed   ??? EYE SURGERY  2017    cataract surgery    ??? HEMORRHOID SURGERY 1992   ??? HYSTERECTOMY      Complete   ??? PR ANAL PRESSURE RECORD N/A 05/01/2018    Procedure: ANORECTAL MANOMETRY;  Surgeon: Nurse-Based Giproc;  [facility];  Service: Gastroenterology   ??? PR COLONOSCOPY W/BIOPSY SINGLE/MULTIPLE  03/17/2013    Procedure: COLONOSCOPY, FLEXIBLE, PROXIMAL TO SPLENIC FLEXURE; WITH BIOPSY, SINGLE OR MULTIPLE;  Surgeon: Teodoro Spray, MD;  Location: GI PROCEDURES [facility];  Service: Gastroenterology   ??? PR COLONOSCOPY W/BIOPSY SINGLE/MULTIPLE N/A 03/13/2017    Procedure: COLONOSCOPY, FLEXIBLE, PROXIMAL TO SPLENIC FLEXURE;  WITH BIOPSY, SINGLE OR MULTIPLE;  Surgeon: [name], MD;  [facility];  Service: Gastroenterology   ??? PR LAMINEC/FACETECT/FORAMIN,LUMBAR 1 SEG Bilateral 05/31/2015    Procedure: L4-5 HEMILAMINECTOMY, DISCECTOMY;  Surgeon: Malachy Chamber, MD;  Location: OR [facility];  Service: Orthopedics   ??? TONSILLECTOMY       Family history:   Family History   Problem Relation Age of Onset   ??? Hypertension Mother    ??? Diabetes Mother    ??? Heart disease Mother         CHF   ??? Atrial fibrillation Mother    ??? Atrial fibrillation Father    ??? Diabetes Father      Social history: Widowed, husband died several years ago. Now engaged to husband's second cousin and relationship is going very well. Lives in [place], [place] but [place]  [facility], near [employer]).  Social History     Socioeconomic History   ??? Marital status: Widowed     Spouse name: None   ??? Number of children: None   ??? Years of education: None   ??? Highest education level: None   Tobacco Use   ??? Smoking status: Former Smoker     Packs/day: 1.00     Years: 12.00     Pack years: 12.00     Quit date: 03/17/2001     Years since quitting: 19.5   ??? Smokeless tobacco: Never Used   Substance and Sexual Activity   ??? Alcohol use: Yes     Comment: maybe once a month   ??? Drug use: No             Allergies:     Allergies   Allergen Reactions   ??? Mercaptopurine Other (See Comments)     Other reaction(s): Pancreatitis.  Used to treat pt. Crohn's Disease.  Caused Pancreatis 1992.       Other reaction(s): Other (See Comments)  Used to treat pt. Crohn's Disease.  Caused Pancreatis 1992.   ??? Amitriptyline    ??? Metoprolol Other (See Comments)     Extreme fatigue   ??? Simvastatin    ??? Trazodone Hcl    ??? Morphine Itching     States can take short term.   ??? Penicillins Rash                   Medications:     Current Outpatient Medications   Medication Sig Dispense Refill   ??? biotin 1 mg tablet Take 1,000 mcg by mouth every morning before breakfast.     ??? estradiol (ESTRACE) 1 MG tablet Take 1 mg by mouth nightly.      ??? ezetimibe (ZETIA) 10 mg tablet Take 10 mg by mouth daily.     ??? gabapentin (NEURONTIN) 300 MG capsule Take 900 mg by mouth nightly.     ??? glucosamine-chondroitin 500-400 mg tablet Take 2 tablets by mouth nightly.      ??? omega-3 fatty acids-fish oil 300-1,000 mg cap capsule Take 1 capsule by mouth daily. Last taken 05/26/15     ??? UNABLE TO FIND Take 1,000 mg by mouth two (2) times a day. Med Name: tumeric     ??? UNABLE TO FIND Take 1 capsule by mouth two (2) times a day. Med Name: cherry extract     ??? ustekinumab (STELARA) 90 mg/mL Syrg syringe Inject the contents of 1 syringe (90 mg total) under the skin every  8 weeks. 1 mL 5   ??? FUROSEMIDE ORAL Take 10 mg by mouth daily as needed. (Patient not taking: Reported on 10/03/2020)     ??? hyoscyamine (LEVSIN/SL) 0.125 mg SL tablet Place 1 tablet (0.125 mg total) under the tongue every four (4) hours as needed for cramping. (Patient not taking: Reported on 10/03/2020) 30 tablet 1   ??? polyethylene glycol (CLEARLAX) 17 gram/dose powder Take as directed per instruction sheet from [facility] Provider. (Patient not taking: Reported on 10/03/2020) 238 g 0   ??? polyethylene glycol (CLEARLAX) 17 gram/dose powder Take as directed per instruction sheet from [facility] Provider. (Patient not taking: Reported on 10/03/2020) 119 g 0   ??? simethicone (MYLICON) 125 MG chewable tablet Take as directed per instruction sheet from [facility] Provider. (Patient not taking: Reported on 10/03/2020) 4 tablet 0     No current facility-administered medications for this visit.             Physical Exam:   BP 164/80  - Pulse (!) 47  - Temp 36.8 ??C (98.3 ??F)  - Wt 77.1 kg (170 lb)  - BMI 27.44 kg/m??     GEN: middle aged female sitting in chair in NAD  EYES: EOMI, no scleral icterus  ENT: ATNC, mask in place   PULM: no increased WOB, no audible wheezes   ABD: soft, NTND, +BS  EXT: warm, no peripheral edema  PSYCH: awake and alert, appropriate affect  NEURO: normal gait, strength grossly intact  SKIN: tan, no rashes or lesions appreciated            Labs, Data & Indices:     Lab Review:   Lab Results   Component Value Date    WBC 7.0 02/23/2019    WBC 11.9 (H) 08/21/2010    RBC 4.76 02/23/2019    RBC 4.48 08/21/2010    HGB 13.8 02/23/2019    HGB 13.2 08/21/2010     Lab Results   Component Value Date    AST 18 02/23/2019    AST 19 08/21/2010    ALT 13 02/23/2019    ALT 29 08/21/2010    BUN 15 02/23/2019    Creatinine 0.72 02/23/2019    CO2 26.0 02/23/2019    Albumin 4.2 02/23/2019    Calcium 9.6 02/23/2019     No results found for: TSH         TSH 1.330  ............................................................................................................................................Marland Kitchen          Assessment & Recommendations:     Brenda Beard is a 55 y.o. female with history of ileocolonic Crohn's disease with perineal involvement s/p ileocolonic resection in 2000. She was previously treated with infliximab which was stopped due to insurance issues and has been maintained on ustekinumab since 2014. Her last endoscopy in 2018 confirmed that her Crohn's was in remission. She has continued to do well from a Crohn's standpoint with intermittent knots in the perianal area - based upon her description these sometimes occur a few days before her ustekinumab injection and are not painful/problematic; she has not found them bothersome enough to use antibiotics. I am hesitant to make any changes to her medical therapy today but we can consider escalation to every 6 weeks on her ustekinumab in the future to see if this would help to avoid development of these knots. She did not have any active perianal issues at today's clinic visit to evaluate on exam today.     Reviewed recent labs from PCP in 08/2020 so  no need to repeat today. We did discuss the importance of prevention today including recommendation for influenza vaccine annually as well as COVID booster (she has had COVID x 2) but she declines at this time. Also recommend Shingrix vaccine series but she has checked with her insurance and they will not cover this even though it is indicated for those > 47 years old as well as immunosuppressed patients > 41 years old (she meets both criteria). Recommend annual skin check with Dermatology and will be due for colonoscopy in 2023 for CRC screening.    PLAN:  1. Crohn's disease  - continue ustekinumab 90 mg SQ every 8 weeks  - reviewed recent monitoring labs, no need to repeat today   - colonoscopy due in 2023 for CRC screening  - let me know if worsening symptoms in perineal area; has Rx for cipro/flagyl at home     2. High risk medication monitoring (Stelara)  - Quant gold negative 02/23/19, HBV serologies without evidence of immunity 02/23/19  - discussed need for twice yearly labs, reviewed recent studies which were WNL    3. Prevention  - recommend annual influenza vaccine  - received Prevnar 2019 with PCP per pt, Pneumovax 03/15/13   - had COVID vaccine x 2; recommend booster but declines at this time (also had COVID x 2)  - recommend Shingrix vaccine series, hasn't gotten because insurance does not cover it (had chicken pox as a kid)   - recommend HBV vaccine series given non-immune 02/23/19   - recommend annual skin check with Dermatology d/t history of skin cancer in the past (left leg)  - CRC surveillance - due in 2023 or sooner if worsening symptoms    4. Follow up in 6-9 months or sooner as needed  --------------------------------------------  Mardee Postin, MD  [facility]  Multidisciplinary Inflammatory Bowel Disease Clinic    I personally spent 36 minutes face-to-face and non-face-to-face in the care of this patient, which includes all pre, intra, and post visit time on the date of service.

## 2020-10-03 NOTE — Unmapped (Addendum)
INSTRUCTIONS:     1.  Continue Stelara every 8 weeks  2.  Reviewed recent labs which were normal   3.  Plan for colonoscopy in 2023   4.  Continue Dermatology visits for annual skin check  5.  Keep an eye on perianal area. Let me know if need updated prescriptions for the cipro and flagyl.   6.  If any trouble or symptoms, do not hesitate to call.     Mardee Postin, MD  Division of Gastroenterology & Hepatology  [name] [facility]    APPOINTMENT [facility] FOR GI CLINIC AND GI PROCEDURES:  GI MEDICINE CLINIC: [phone] option 1  GI PROCEDURES: [phone] option 2  *To schedule, reschedule, or cancel your GI appointment, please call [phone]. If you are unable to come to an appointment, please notify us as soon as possible, preferably 24 hours in advance. Doing so may allow other patients with urgent needs to be scheduled in a cancelled appointment slot.  RADIOLOGY - to schedule imaging ordered, please call [phone] option 1    IBD NURSE COORDINATOR CONTACT - Neta Mends, RN BSN  Phone: [phone] (direct line)  Fax: [phone]  * For urgent medical concerns after hours or on weekends and holidays, call [phone] and ask to speak to the GI Fellow on call.  * If you have a GI medical question or GI symptoms and would like to speak to your provider???s healthcare team, please contact Neta Mends (contact information above) OR you can send the GI healthcare team a message through MyChart at [email or web address]    TEST RESULTS  If you have a MyChart account, your new results and a provider message will be sent to you through your MyChart account at [email or web address]. For results that require follow-up, a member of your healthcare team will also contact you directly.    PRESCRIPTION REFILL REQUESTS  To request prescription refills, please contact your pharmacy or send your healthcare team a message through your MyChart account at [email or web address]    RECORD REQUESTS  For questions related to medical records, please call Medical Records Release of Information at [phone]    FINANCIAL COUNSELOR  For billing and other financial questions/needs - please contact Mee Hives at [phone]. If you need to leave a message, please be sure to leave your full name, date of birth or MR#, best call back # and reason for call.    For educational material and resources:  [email or web address]  ============================================

## 2020-10-10 DIAGNOSIS — Z1231 Encounter for screening mammogram for malignant neoplasm of breast: Secondary | ICD-10-CM | POA: Diagnosis not present

## 2020-10-23 ENCOUNTER — Other Ambulatory Visit: Payer: Self-pay | Admitting: Family Medicine

## 2020-10-24 NOTE — Telephone Encounter (Signed)
Notify pt she is due for mammogram --- will refill med for 30 days - get mammogram scheduled

## 2020-11-16 NOTE — Unmapped (Signed)
[facility] Clinical Assessment & Refill Coordination Note    Brenda Beard, DOB: May 27, 1965  Phone: [phone] (home)     All above HIPAA information was verified with patient.     Was a Nurse, learning disability used for this call? No    Specialty Medication(s):   Inflammatory Disorders: Stelara     Current Outpatient Medications   Medication Sig Dispense Refill   ??? biotin 1 mg tablet Take 1,000 mcg by mouth every morning before breakfast.     ??? estradiol (ESTRACE) 1 MG tablet Take 1 mg by mouth nightly.      ??? ezetimibe (ZETIA) 10 mg tablet Take 10 mg by mouth daily.     ??? gabapentin (NEURONTIN) 300 MG capsule Take 900 mg by mouth nightly.     ??? glucosamine-chondroitin 500-400 mg tablet Take 2 tablets by mouth nightly.      ??? omega-3 fatty acids-fish oil 300-1,000 mg cap capsule Take 1 capsule by mouth daily. Last taken 05/26/15     ??? UNABLE TO FIND Take 1,000 mg by mouth two (2) times a day. Med Name: tumeric     ??? UNABLE TO FIND Take 1 capsule by mouth two (2) times a day. Med Name: cherry extract     ??? ustekinumab (STELARA) 90 mg/mL Syrg syringe Inject the contents of 1 syringe (90 mg total) under the skin every 8 weeks. 1 mL 5     No current facility-administered medications for this visit.        Changes to medications: Larah reports no changes at this time.    Allergies   Allergen Reactions   ??? Mercaptopurine Other (See Comments)     Other reaction(s): Pancreatitis.  Used to treat pt. Crohn's Disease.  Caused Pancreatis 1992.       Other reaction(s): Other (See Comments)  Used to treat pt. Crohn's Disease.  Caused Pancreatis 1992.   ??? Amitriptyline    ??? Metoprolol Other (See Comments)     Extreme fatigue   ??? Simvastatin    ??? Trazodone Hcl    ??? Morphine Itching     States can take short term.   ??? Penicillins Rash             Changes to allergies: No    SPECIALTY MEDICATION ADHERENCE         Medication Adherence    Patient reported X missed doses in the last month: 0  Specialty Medication: Stelara Q8W  Patient is on additional specialty medications: No  Informant: patient          Specialty medication(s) dose(s) confirmed: Regimen is correct and unchanged.     Are there any concerns with adherence? No    Adherence counseling provided? Not needed    CLINICAL MANAGEMENT AND INTERVENTION      Clinical Benefit Assessment:    Do you feel the medicine is effective or helping your condition? Yes    Clinical Benefit counseling provided? Progress note from 5/24 shows evidence of clinical benefit    Adverse Effects Assessment:    Are you experiencing any side effects? No    Are you experiencing difficulty administering your medicine? No    Quality of Life Assessment:    Quality of Life    Rheumatology  On a scale of 1 - 10 with 1 representing not at all and 10 representing completely - how has your rheumatologic condition affected your:  Oncology  Dermatology  How many days over the past month did your Crohn  keep you from your normal activities? For example, brushing your teeth or getting up in the morning. 0    Have you discussed this with your provider? Not needed    Acute Infection Status:    Acute infections noted within Epic:  No active infections  Patient reported infection: None    Therapy Appropriateness:    Is therapy appropriate? Yes, therapy is appropriate and should be continued    DISEASE/MEDICATION-SPECIFIC INFORMATION      For patients on injectable medications: Patient currently has 0 doses left.  Next injection is scheduled for 7/17.    PATIENT SPECIFIC NEEDS     - Does the patient have any physical, cognitive, or cultural barriers? No    - Is the patient high risk? No    - Does the patient require a Care Management Plan? No     - Does the patient require physician intervention or other additional services (i.e. nutrition, smoking cessation, social work)? No      SHIPPING     Specialty Medication(s) to be Shipped:   Inflammatory Disorders: Stelara    Other medication(s) to be shipped: No additional medications requested for fill at this time     Changes to insurance: No    Delivery Scheduled: Yes, Expected medication delivery date: 7/13.     Medication will be delivered via UPS to the confirmed prescription address in [facility].    The patient will receive a drug information handout for each medication shipped and additional FDA Medication Guides as required.  Verified that patient has previously received a Conservation officer, historic buildings and a Surveyor, mining.    The patient or caregiver noted above participated in the development of this care plan and knows that they can request review of or adjustments to the care plan at any time.      All of the patient's questions and concerns have been addressed.    Julianne Rice   [facility] Specialty Pharmacist

## 2020-11-21 ENCOUNTER — Other Ambulatory Visit: Payer: Self-pay | Admitting: Family Medicine

## 2020-11-21 MED ORDER — GABAPENTIN 300 MG PO CAPS: 300.0000 mg | ORAL_CAPSULE | Freq: Three times a day (TID) | ORAL | 1 refills | Status: DC

## 2020-11-23 ENCOUNTER — Other Ambulatory Visit: Payer: Self-pay

## 2020-11-23 MED ORDER — GABAPENTIN 300 MG PO CAPS: 300.0000 mg | ORAL_CAPSULE | Freq: Three times a day (TID) | ORAL | 1 refills | Status: DC

## 2021-01-02 ENCOUNTER — Other Ambulatory Visit: Payer: Self-pay

## 2021-01-02 ENCOUNTER — Ambulatory Visit (INDEPENDENT_AMBULATORY_CARE_PROVIDER_SITE_OTHER): Payer: Medicare HMO | Admitting: Family Medicine

## 2021-01-02 ENCOUNTER — Encounter: Payer: Self-pay | Admitting: Family Medicine

## 2021-01-02 VITALS — BP 140/68 | HR 52 | Temp 97.3°F | Resp 14 | Ht 66.0 in | Wt 170.6 lb

## 2021-01-02 DIAGNOSIS — E782 Mixed hyperlipidemia: Secondary | ICD-10-CM

## 2021-01-02 DIAGNOSIS — E538 Deficiency of other specified B group vitamins: Secondary | ICD-10-CM

## 2021-01-02 DIAGNOSIS — K5080 Crohn's disease of both small and large intestine without complications: Secondary | ICD-10-CM | POA: Diagnosis not present

## 2021-01-02 DIAGNOSIS — E1169 Type 2 diabetes mellitus with other specified complication: Secondary | ICD-10-CM

## 2021-01-02 DIAGNOSIS — Z23 Encounter for immunization: Secondary | ICD-10-CM | POA: Diagnosis not present

## 2021-01-02 DIAGNOSIS — E785 Hyperlipidemia, unspecified: Secondary | ICD-10-CM | POA: Diagnosis not present

## 2021-01-02 DIAGNOSIS — E663 Overweight: Secondary | ICD-10-CM

## 2021-01-02 DIAGNOSIS — F5101 Primary insomnia: Secondary | ICD-10-CM

## 2021-01-02 MED ORDER — ZOLPIDEM TARTRATE 5 MG PO TABS: 5.0000 mg | ORAL_TABLET | Freq: Every evening | ORAL | 1 refills | Status: DC | PRN

## 2021-01-02 MED ORDER — PHENTERMINE HCL 15 MG PO CAPS
15.0000 mg | ORAL_CAPSULE | ORAL | 2 refills | Status: DC
Start: 2021-01-02 — End: 2021-04-10

## 2021-01-02 NOTE — Progress Notes (Signed)
Established Patient Office Visit  Subjective:  Patient ID: Brittany [name], female    DOB: Jun 12, 1965  Age: 55 y.o. MRN: [id number]  CC:  Chief Complaint  Patient presents with   Diabetes   Hyperlipidemia   Hypertension    HPI Hyperlipidemia associated with type 2 diabetes mellitus (HCC) She is attempting to eat healthy to control the diabetes. She admits to not exercising but is very active at home. She has been checking her blood sugars occasionally which range from 110-120. She does check her feet daily and keeps scheduled yearly eye exams. She takes Zetia 10 mg daily and Fish Oil 1 g daily. BMI 27.54 Mixed hyperlipidemia She takes Zetia 10 mg daily and Fish Oil 1 g daily and does attempt to eat healthy Crohn's disease of both small and large intestine without complication (HCC) She reports occasional problems and abdominal tenderness with Crohn's but denies any problems during the visit.  Insomnia She reports she is only able to sleep 4-5 hours per night. She is able to fall asleep quickly but has trouble staying asleep. She has been taking Melatonin and Gabapentin at night but reports it is no longer working. She has taken Xanax in the past and reported that has worked.   Past Medical History:  Diagnosis Date   BMI 26.0-26.9,adult 07/06/2019   Bradycardia 08/10/2014   Crohn's disease (HCC)    DDD (degenerative disc disease), lumbar 07/17/2017   Diabetes mellitus without complication (HCC)    Fatigue 08/10/2014   Hyperlipidemia    Hyperlipidemia associated with type 2 diabetes mellitus (HCC) 07/06/2019   Hypertension    Hypertension, essential    Mixed hyperlipidemia    Palpitations 07/06/2019   Paresthesia 07/06/2019    Past Surgical History:  Procedure Laterality Date   ABDOMINAL HYSTERECTOMY N/A 10/20/2012   Procedure: HYSTERECTOMY ABDOMINAL;  Surgeon: Miguel Aschoff, MD;  Location: [facility];  Service: Gynecology;  Laterality: N/A;   ANAL FISSURE REPAIR  2002   APPENDECTOMY      SALPINGOOPHORECTOMY Bilateral 10/20/2012   Procedure: SALPINGO OOPHORECTOMY;  Surgeon: Miguel Aschoff, MD;  Location: [facility];  Service: Gynecology;  Laterality: Bilateral;   SMALL INTESTINE SURGERY     reconstruction   TONSILLECTOMY      Family History  Problem Relation Age of Onset   Heart Problems Mother    Diabetes Mother    Diabetes Father    Heart Problems Father    Cancer Maternal Grandmother        lung   Peptic Ulcer Disease Other     Social History   Socioeconomic History   Marital status: Widowed    Spouse name: Not on file   Number of children: 1   Years of education: Not on file   Highest education level: Not on file  Occupational History   Not on file  Tobacco Use   Smoking status: Former    Types: Cigarettes    Quit date: 2002    Years since quitting: 20.6   Smokeless tobacco: Never  Vaping Use   Vaping Use: Never used  Substance and Sexual Activity   Alcohol use: Yes   Drug use: No   Sexual activity: Not on file  Other Topics Concern   Not on file  Social History Narrative   wears sunscreen, brushes and flosses daily, see's dentist bi-annually, has smoke/carbon monoxide detectors, wears a seatbelt and practices gun safety   Social Determinants of Health   Financial Resource Strain: Not on file  Food Insecurity: Not on file  Transportation Needs: Not on file  Physical Activity: Not on file  Stress: Not on file  Social Connections: Not on file  Intimate Partner Violence: Not on file    Outpatient Medications Prior to Visit  Medication Sig Dispense Refill   Biotin 1000 MCG tablet Take 1,000 mcg by mouth daily.     blood glucose meter kit and supplies KIT Check sugars daily as needed. Hypoglycemia e16.2 1 each 0   calcium-vitamin D (OSCAL WITH D) 500-200 MG-UNIT per tablet Take 1 tablet by mouth daily.     cholecalciferol (VITAMIN D3) 25 MCG (1000 UNIT) tablet Take 1,000 Units by mouth daily.     estradiol (ESTRACE) 1 MG tablet TAKE 1 TABLET  EVERY DAY 30 tablet 0   ezetimibe (ZETIA) 10 MG tablet Take 1 tablet (10 mg total) by mouth daily. 90 tablet 1   fish oil-omega-3 fatty acids 1000 MG capsule Take 1 g by mouth daily.     gabapentin (NEURONTIN) 300 MG capsule Take 1 capsule (300 mg total) by mouth 3 (three) times daily. 270 capsule 1   Glucosamine-Chondroit-Vit C-Mn (GLUCOSAMINE 1500 COMPLEX PO) Glucosamine     glucose blood (ACCU-CHEK GUIDE) test strip Check FBS 3 times daily 100 each 12   Melatonin 10 MG CAPS Take 10 mg by mouth at bedtime.      Misc Natural Products (TART CHERRY ADVANCED PO) Take by mouth daily.     TURMERIC PO Take by mouth in the morning and at bedtime.     Ustekinumab (STELARA [place]) Inject into the skin every 8 (eight) weeks.     fluticasone (FLONASE) 50 MCG/ACT nasal spray SPRAY 2 SPRAYS INTO EACH NOSTRIL EVERY DAY 16 mL 2   loratadine (CLARITIN) 10 MG tablet TAKE 1 TABLET BY MOUTH EVERY DAY 90 tablet 0   sulfamethoxazole-trimethoprim (BACTRIM DS) 800-160 MG tablet Take 1 tablet by mouth 2 (two) times daily. 10 tablet 0   No facility-administered medications prior to visit.    Allergies  Allergen Reactions   Mercaptopurine     Used to treat pt. Crohn's Disease.  Caused Pancreatis 1992.   Amitriptyline    Crestor [Rosuvastatin Calcium]     Muscle pain   Metoprolol Other (See Comments)    Extreme fatigue Extreme fatigue    Trazodone And Nefazodone    Zocor [Simvastatin]    Morphine And Related Itching   Penicillins Rash    ROS Review of Systems  Constitutional:  Positive for fatigue. Negative for chills.  HENT:  Negative for congestion and sore throat.   Eyes:  Negative for visual disturbance.  Respiratory:  Negative for cough and shortness of breath.   Cardiovascular:  Negative for chest pain, palpitations and leg swelling.  Gastrointestinal:  Negative for abdominal pain, constipation, diarrhea, nausea and vomiting.  Endocrine: Negative for cold intolerance, heat intolerance, polyphagia  and polyuria.  Genitourinary:  Negative for difficulty urinating, frequency and urgency.  Musculoskeletal:  Positive for arthralgias, back pain (chronic) and myalgias.  Skin:  Negative for color change.  Neurological:  Negative for dizziness, weakness and numbness.  Psychiatric/Behavioral:  The patient is not nervous/anxious.      Objective:    Physical Exam Vitals reviewed.  Constitutional:      Appearance: Normal appearance.  HENT:     Head: Normocephalic.     Right Ear: Tympanic membrane, ear canal and external ear normal.     Left Ear: Tympanic membrane, ear canal and external ear  normal.     Nose: Nose normal.     Mouth/Throat:     Mouth: Mucous membranes are moist.  Neck:     Vascular: No carotid bruit.  Cardiovascular:     Rate and Rhythm: Normal rate and regular rhythm.     Pulses: Normal pulses.     Heart sounds: Normal heart sounds.  Pulmonary:     Effort: Pulmonary effort is normal.     Breath sounds: Normal breath sounds.  Abdominal:     General: Abdomen is flat. Bowel sounds are normal.     Palpations: Abdomen is soft.  Skin:    General: Skin is warm and dry.  Neurological:     General: No focal deficit present.     Mental Status: She is alert and oriented to person, place, and time. Mental status is at baseline.  Psychiatric:        Mood and Affect: Mood normal.        Behavior: Behavior normal.   Diabetic Foot Exam - Simple   Simple Foot Form Visual Inspection No deformities, no ulcerations, no other skin breakdown bilaterally: Yes Sensation Testing Intact to touch and monofilament testing bilaterally: Yes Pulse Check Posterior Tibialis and Dorsalis pulse intact bilaterally: Yes Comments     BP 140/68   Pulse (!) 52   Temp (!) 97.3 F (36.3 C)   Resp 14   Ht $R'5\' 6"'YO$  (1.676 m)   Wt 170 lb 9.6 oz (77.4 kg)   LMP 09/23/2012   BMI 27.54 kg/m  Wt Readings from Last 3 Encounters:  01/02/21 170 lb 9.6 oz (77.4 kg)  08/28/20 169 lb 3.2 oz (76.7  kg)  08/25/20 168 lb (76.2 kg)     Health Maintenance Due  Topic Date Due   TETANUS/TDAP  Never done   Zoster Vaccines- Shingrix (1 of 2) Never done   Pneumococcal Vaccine 63-92 Years old (3 - PPSV23 or PCV20) 03/15/2018   OPHTHALMOLOGY EXAM  06/18/2019   COVID-19 Vaccine (3 - Pfizer risk series) 09/22/2019   FOOT EXAM  10/25/2020   INFLUENZA VACCINE  12/11/2020    There are no preventive care reminders to display for this patient.  Lab Results  Component Value Date   TSH 1.330 08/25/2020   Lab Results  Component Value Date   WBC 5.5 08/25/2020   HGB 14.0 08/25/2020   HCT 42.5 08/25/2020   MCV 90 08/25/2020   PLT 202 08/25/2020   Lab Results  Component Value Date   NA 142 08/25/2020   K 4.7 08/25/2020   CO2 25 08/25/2020   GLUCOSE 93 08/25/2020   BUN 16 08/25/2020   CREATININE 0.69 08/25/2020   BILITOT 0.6 08/25/2020   ALKPHOS 60 08/25/2020   AST 14 08/25/2020   ALT 9 08/25/2020   PROT 6.9 08/25/2020   ALBUMIN 4.4 08/25/2020   CALCIUM 9.9 08/25/2020   EGFR 102 08/25/2020   Lab Results  Component Value Date   CHOL 234 (H) 08/25/2020   Lab Results  Component Value Date   HDL 95 08/25/2020   Lab Results  Component Value Date   LDLCALC 124 (H) 08/25/2020   Lab Results  Component Value Date   TRIG 90 08/25/2020   Lab Results  Component Value Date   CHOLHDL 2.5 08/25/2020   Lab Results  Component Value Date   HGBA1C 5.6 08/25/2020      Assessment & Plan:  1. Hyperlipidemia associated with type 2 diabetes mellitus ([place]) - Controlled -  CBC with Differential/Platelet - Comprehensive metabolic panel - Hemoglobin A1c - Continue Zetia 10 mg daily - Continue Fish Oil 1 g daily - Continue healthy diet  2. Mixed hyperlipidemia - Not quite at goal - Lipid panel - Continue Zetia 10 mg daily - Continue Fish Oil 1 g daily - Continue healthy diet  3. Crohn's disease of both small and large intestine without complication ([place]) - Well controlled -  Continue current regimen  4. Primary insomnia - New diagnosis - zolpidem (AMBIEN) 5 MG tablet; Take 1 tablet (5 mg total) by mouth at bedtime as needed for sleep.  Dispense: 30 tablet; Refill: 1 - Limit caffeine and activity before bed to establish sleep schedule  5. Needs flu shot - Flu Vaccine MDCK QUAD PF  6. B12 deficiency - B12 and Folate Panel - Methylmalonic acid(mma), rnd urine  7. Overweight - phentermine 15 MG capsule; Take 1 capsule (15 mg total) by mouth every morning.  Dispense: 30 capsule; Refill: 2    Meds ordered this encounter  Medications   zolpidem (AMBIEN) 5 MG tablet    Sig: Take 1 tablet (5 mg total) by mouth at bedtime as needed for sleep.    Dispense:  30 tablet    Refill:  1   phentermine 15 MG capsule    Sig: Take 1 capsule (15 mg total) by mouth every morning.    Dispense:  30 capsule    Refill:  2   I have personally reviewed this encounter including the documentation in this note and have collaborated with Gurney Maxin, NP student. Patient history was reviewed with the patient and the patient was examined. I am certifying that I agree with the content of this note and encounter as supervising physician.   Follow-up: Return in about 3 months (around 04/04/2021), or fasting.    [name], MD

## 2021-01-03 LAB — COMPREHENSIVE METABOLIC PANEL
ALT: 11 IU/L (ref 0–32)
AST: 14 IU/L (ref 0–40)
Albumin/Globulin Ratio: 2.2 (ref 1.2–2.2)
Albumin: 4.6 g/dL (ref 3.8–4.9)
Alkaline Phosphatase: 64 IU/L (ref 44–121)
BUN/Creatinine Ratio: 26 — ABNORMAL HIGH (ref 9–23)
BUN: 19 mg/dL (ref 6–24)
Bilirubin Total: 0.5 mg/dL (ref 0.0–1.2)
CO2: 26 mmol/L (ref 20–29)
Calcium: 9.6 mg/dL (ref 8.7–10.2)
Chloride: 103 mmol/L (ref 96–106)
Creatinine, Ser: 0.74 mg/dL (ref 0.57–1.00)
Globulin, Total: 2.1 g/dL (ref 1.5–4.5)
Glucose: 98 mg/dL (ref 65–99)
Potassium: 4.6 mmol/L (ref 3.5–5.2)
Sodium: 143 mmol/L (ref 134–144)
Total Protein: 6.7 g/dL (ref 6.0–8.5)
eGFR: 94 mL/min/{1.73_m2} (ref >59)

## 2021-01-03 LAB — CBC WITH DIFFERENTIAL/PLATELET
Basophils Absolute: 0.1 10*3/uL (ref 0.0–0.2)
Basos: 1 %
EOS (ABSOLUTE): 0.1 10*3/uL (ref 0.0–0.4)
Eos: 1 %
Hematocrit: 43.4 % (ref 34.0–46.6)
Hemoglobin: 14 g/dL (ref 11.1–15.9)
Immature Grans (Abs): 0 10*3/uL (ref 0.0–0.1)
Immature Granulocytes: 0 %
Lymphocytes Absolute: 1.9 10*3/uL (ref 0.7–3.1)
Lymphs: 34 %
MCH: 29 pg (ref 26.6–33.0)
MCHC: 32.3 g/dL (ref 31.5–35.7)
MCV: 90 fL (ref 79–97)
Monocytes Absolute: 0.4 10*3/uL (ref 0.1–0.9)
Monocytes: 8 %
Neutrophils Absolute: 3 10*3/uL (ref 1.4–7.0)
Neutrophils: 56 %
Platelets: 213 10*3/uL (ref 150–450)
RBC: 4.82 x10E6/uL (ref 3.77–5.28)
RDW: 13.1 % (ref 11.7–15.4)
WBC: 5.5 10*3/uL (ref 3.4–10.8)

## 2021-01-03 LAB — LIPID PANEL
Chol/HDL Ratio: 2.9 ratio (ref 0.0–4.4)
Cholesterol, Total: 232 mg/dL — ABNORMAL HIGH (ref 100–199)
HDL: 81 mg/dL (ref >39)
LDL Chol Calc (NIH): 129 mg/dL — ABNORMAL HIGH (ref 0–99)
Triglycerides: 126 mg/dL (ref 0–149)
VLDL Cholesterol Cal: 22 mg/dL (ref 5–40)

## 2021-01-03 LAB — B12 AND FOLATE PANEL
Folate: 11.5 ng/mL (ref >3.0)
Vitamin B-12: 890 pg/mL (ref 232–1245)

## 2021-01-03 LAB — HEMOGLOBIN A1C
Est. average glucose Bld gHb Est-mCnc: 114 mg/dL
Hgb A1c MFr Bld: 5.6 % (ref 4.8–5.6)

## 2021-01-03 LAB — CARDIOVASCULAR RISK ASSESSMENT

## 2021-01-04 LAB — METHYLMALONIC ACID, SERUM: Methylmalonic Acid: 103 nmol/L (ref 0–378)

## 2021-01-11 NOTE — Unmapped (Signed)
[facility] Refill Coordination Note    Specialty Medication(s) to be Shipped:   Inflammatory Disorders: Stelara    Other medication(s) to be shipped: No additional medications requested for fill at this time     Brenda Beard, DOB: [birth year]  Phone: [phone] (home)       All above HIPAA information was verified with patient.     Was a Nurse, learning disability used for this call? No    Completed refill call assessment today to schedule patient's medication shipment from the [facility] [phone]).  All relevant notes have been reviewed.     Specialty medication(s) and dose(s) confirmed: Regimen is correct and unchanged.   Changes to medications: Sable reports no changes at this time.  Changes to insurance: No  New side effects reported not previously addressed with a pharmacist or physician: None reported  Questions for the pharmacist: No    Confirmed patient received a Conservation officer, historic buildings and a Surveyor, mining with first shipment. The patient will receive a drug information handout for each medication shipped and additional FDA Medication Guides as required.       DISEASE/MEDICATION-SPECIFIC INFORMATION        For patients on injectable medications: Patient currently has 0 doses left.  Next injection is scheduled for 01/22/21.    SPECIALTY MEDICATION ADHERENCE     Medication Adherence    Patient reported X missed doses in the last month: 0  Specialty Medication: STELARA 90 mg/m  Patient is on additional specialty medications: No  Patient is on more than two specialty medications: No  Any gaps in refill history greater than 2 weeks in the last 3 months: no  Demonstrates understanding of importance of adherence: yes  Informant: patient  Reliability of informant: reliable  Confirmed plan for next specialty medication refill: delivery by pharmacy  Refills needed for supportive medications: not needed              Were doses missed due to medication being on hold? No    STELARA 90 mg/mL: 0 days of medicine on hand       REFERRAL TO PHARMACIST     Referral to the pharmacist: Not needed      [facility]     Shipping address confirmed in Epic.     Delivery Scheduled: Yes, Expected medication delivery date: 01/18/21.     Medication will be delivered via UPS to the prescription address in Epic WAM.    [name]   [facility] Specialty Technician

## 2021-01-17 MED FILL — STELARA 90 MG/ML SUBCUTANEOUS SYRINGE: SUBCUTANEOUS | 56 days supply | Qty: 1 | Fill #1

## 2021-01-19 ENCOUNTER — Telehealth: Payer: Self-pay

## 2021-01-19 NOTE — Chronic Care Management (AMB) (Signed)
Chronic Care Management Pharmacy Assistant   Name: Brittany [name]  MRN: [id number] DOB: [birth year]   Reason for Encounter: Disease State/ Hypertension  Recent office visits:  01-02-2021 [name], MD. COMPLETED Bactrim, Claritin and Flonase. START Ambien 5 mg as needed and Phentermine 15 mg daily. BUN/Creatinine ration= 26. Cholesterol= 232, LDL= 129. Flu vaccine given.  08-28-2020 [name], MD. START Bactrim 800-160 mg one tablet twice daily for 5 days. Rocephin injection given.  08-25-2020 Marge Duncans, PA-C. Cholesterol= 234, LDL= 124  07-25-2020 [name], MD. STOP Cefdinir  06-21-2020 [name], Kirsten, MD. Positive covid test. START Cefdinir 300 mg twice daily. START Flonase 2 sprays in both nostrils daily.  Recent consult visits:  10-10-2020 Vanessa Kick, MD (OBGYN). Mammogram completed  10-03-2020 Burnell Blanks, MD (GI). STOP Clearlax, Hyoscyamine and Simethicone. START Lasix 10 mg as needed.  Hospital visits:  None in previous 6 months  Medications: Outpatient Encounter Medications as of 01/19/2021  Medication Sig   Biotin 1000 MCG tablet Take 1,000 mcg by mouth daily.   blood glucose meter kit and supplies KIT Check sugars daily as needed. Hypoglycemia e16.2   calcium-vitamin D (OSCAL WITH D) 500-200 MG-UNIT per tablet Take 1 tablet by mouth daily.   cholecalciferol (VITAMIN D3) 25 MCG (1000 UNIT) tablet Take 1,000 Units by mouth daily.   estradiol (ESTRACE) 1 MG tablet TAKE 1 TABLET EVERY DAY   ezetimibe (ZETIA) 10 MG tablet Take 1 tablet (10 mg total) by mouth daily.   fish oil-omega-3 fatty acids 1000 MG capsule Take 1 g by mouth daily.   gabapentin (NEURONTIN) 300 MG capsule Take 1 capsule (300 mg total) by mouth 3 (three) times daily.   Glucosamine-Chondroit-Vit C-Mn (GLUCOSAMINE 1500 COMPLEX PO) Glucosamine   glucose blood (ACCU-CHEK GUIDE) test strip Check FBS 3 times daily   Melatonin 10 MG CAPS Take 10 mg by mouth at bedtime.    Misc Natural  Products (TART CHERRY ADVANCED PO) Take by mouth daily.   phentermine 15 MG capsule Take 1 capsule (15 mg total) by mouth every morning.   TURMERIC PO Take by mouth in the morning and at bedtime.   Ustekinumab (STELARA [place]) Inject into the skin every 8 (eight) weeks.   zolpidem (AMBIEN) 5 MG tablet Take 1 tablet (5 mg total) by mouth at bedtime as needed for sleep.   No facility-administered encounter medications on file as of 01/19/2021.    Recent Office Vitals: BP Readings from Last 3 Encounters:  01/02/21 140/68  08/28/20 132/74  08/25/20 118/68   Pulse Readings from Last 3 Encounters:  01/02/21 (!) 52  08/28/20 (!) 52  08/25/20 (!) 55    Wt Readings from Last 3 Encounters:  01/02/21 170 lb 9.6 oz (77.4 kg)  08/28/20 169 lb 3.2 oz (76.7 kg)  08/25/20 168 lb (76.2 kg)     Kidney Function Lab Results  Component Value Date/Time   CREATININE 0.74 01/02/2021 08:56 AM   CREATININE 0.69 08/25/2020 10:05 AM   GFRNONAA 99 06/06/2020 09:57 AM   GFRAA 114 06/06/2020 09:57 AM    BMP Latest Ref Rng & Units 01/02/2021 08/25/2020 06/06/2020  Glucose 65 - 99 mg/dL 98 93 91  BUN 6 - 24 mg/dL $Remove'19 16 15  'ZigSrWn$ Creatinine 0.57 - 1.00 mg/dL 0.74 0.69 0.66  BUN/Creat Ratio 9 - 23 26(H) 23 23  Sodium 134 - 144 mmol/L 143 142 143  Potassium 3.5 - 5.2 mmol/L 4.6 4.7 4.6  Chloride 96 - 106 mmol/L 103  103 105  CO2 20 - 29 mmol/L $RemoveB'26 25 24  'zhoMGyfi$ Calcium 8.7 - 10.2 mg/dL 9.6 9.9 9.5     Current antihypertensive regimen:  Patient is controlling with diet and exercise  How often are you checking your Blood Pressure? Patient states she doesn't check blood pressure at home  Caffeine intake: limited Salt intake:limited OTC medications including pseudoephedrine or NSAIDs? None  Any readings above 180/120? No  What recent interventions/DTPs have been made by any provider to improve Blood Pressure control since last CPP Visit:  Discussed benefits of exercise and diet to reduce blood pressure  Any recent  hospitalizations or ED visits since last visit with CPP? No  What diet changes have been made to improve Blood Pressure Control?  Patient states she watches her sal intake and drinks plenty of water.  What exercise is being done to improve your Blood Pressure Control?  Patient states she tries to use her exercise ball as much as possible and stays active.  Adherence Review: Is the patient currently on ACE/ARB medication? No Does the patient have >5 day gap between last estimated fill dates? CPP to review  Care Gaps: Last annual wellness visit? 06-06-2020 Tdap overdue Shingrix overdue Pneumococcal overdue Yearly foot exam overdue RAF= 1.055%  Star Rating Drugs: None  [place] Clinical Pharmacist Assistant [phone]

## 2021-01-26 ENCOUNTER — Other Ambulatory Visit: Payer: Self-pay | Admitting: Physician Assistant

## 2021-01-30 DIAGNOSIS — L578 Other skin changes due to chronic exposure to nonionizing radiation: Secondary | ICD-10-CM | POA: Diagnosis not present

## 2021-01-30 DIAGNOSIS — L570 Actinic keratosis: Secondary | ICD-10-CM | POA: Diagnosis not present

## 2021-01-30 DIAGNOSIS — L821 Other seborrheic keratosis: Secondary | ICD-10-CM | POA: Diagnosis not present

## 2021-02-24 LAB — HM DIABETES EYE EXAM

## 2021-03-01 ENCOUNTER — Ambulatory Visit: Payer: Medicare HMO | Admitting: Family Medicine

## 2021-03-06 NOTE — Unmapped (Signed)
 Reason For Call:  Patient called to discuss Stelara letter from [employer] stating she will no longer be eligible for the program as she has insurance as of 06/04/20.      Assessment:   Called pt back but she did not answer. Left vm to follow directions on the letter from [employer] and that we will also send referral to [facility]    Sent refferal for stelara to [facility]      Follow Up:  - Patient verbalized understanding and agreement with plan       Workup Time:   10 min

## 2021-03-06 NOTE — Unmapped (Incomplete Revision)
Reason For Call:  Patient called to discuss Stelara letter from [employer] stating she will no longer be eligible for the program as she has insurance as of 06/04/20.      Assessment:   Called pt back but she did not answer. Left vm to follow directions on the letter from [employer] and that we will also send referral to [facility]    Sent refferal for stelara to [employer] on 03/07/21     Follow Up:  - Patient verbalized understanding and agreement with plan       Workup Time:   10 min

## 2021-03-07 DIAGNOSIS — H5201 Hypermetropia, right eye: Secondary | ICD-10-CM | POA: Diagnosis not present

## 2021-03-07 NOTE — Unmapped (Signed)
[facility] Refill Coordination Note    Specialty Medication(s) to be Shipped:   Inflammatory Disorders: Stelara    Other medication(s) to be shipped: No additional medications requested for fill at this time     Brenda Beard, DOB: 01-11-1966  Phone: [phone] (home)       All above HIPAA information was verified with patient.     Was a Nurse, learning disability used for this call? No    Completed refill call assessment today to schedule patient's medication shipment from the [facility] [phone]).  All relevant notes have been reviewed.     Specialty medication(s) and dose(s) confirmed: Regimen is correct and unchanged.   Changes to medications: Brenda Beard reports no changes at this time.  Changes to insurance: No  New side effects reported not previously addressed with a pharmacist or physician: None reported  Questions for the pharmacist: No    Confirmed patient received a Conservation officer, historic buildings and a Surveyor, mining with first shipment. The patient will receive a drug information handout for each medication shipped and additional FDA Medication Guides as required.       DISEASE/MEDICATION-SPECIFIC INFORMATION        For patients on injectable medications: Patient currently has 0 doses left.  Next injection is scheduled for 03/18/21.    SPECIALTY MEDICATION ADHERENCE     Medication Adherence    Patient reported X missed doses in the last month: 0  Specialty Medication: STELARA 90 mg/mL  Patient is on additional specialty medications: No  Patient is on more than two specialty medications: No  Any gaps in refill history greater than 2 weeks in the last 3 months: no  Demonstrates understanding of importance of adherence: yes  Informant: patient  Reliability of informant: reliable  Confirmed plan for next specialty medication refill: delivery by pharmacy  Refills needed for supportive medications: not needed              Were doses missed due to medication being on hold? No    STELARA 90 mg/mL : 0 days of medicine on hand        REFERRAL TO PHARMACIST     Referral to the pharmacist: Not needed      [facility]     Shipping address confirmed in Epic.     Delivery Scheduled: Yes, Expected medication delivery date: 03/13/21.     Medication will be delivered via UPS to the prescription address in Epic WAM.    Brenda Beard   [facility] Specialty Technician

## 2021-03-12 MED FILL — STELARA 90 MG/ML SUBCUTANEOUS SYRINGE: SUBCUTANEOUS | 56 days supply | Qty: 1 | Fill #2

## 2021-03-21 NOTE — Addendum Note (Signed)
Addended by: Karle Barr on: 03/21/2021 02:14 PM   Modules accepted: Level of Service

## 2021-04-01 ENCOUNTER — Encounter: Payer: Self-pay | Admitting: Physician Assistant

## 2021-04-01 ENCOUNTER — Emergency Department (HOSPITAL_COMMUNITY)
Admission: EM | Admit: 2021-04-01 | Discharge: 2021-04-01 | Disposition: A | Discharge status: Home / Self Care | Payer: Medicare HMO | Attending: Emergency Medicine | Admitting: Emergency Medicine

## 2021-04-01 ENCOUNTER — Encounter (HOSPITAL_COMMUNITY): Payer: Self-pay | Admitting: Emergency Medicine

## 2021-04-01 ENCOUNTER — Emergency Department (HOSPITAL_COMMUNITY): Payer: Medicare HMO

## 2021-04-01 ENCOUNTER — Other Ambulatory Visit: Payer: Self-pay

## 2021-04-01 DIAGNOSIS — Z79899 Other long term (current) drug therapy: Secondary | ICD-10-CM | POA: Diagnosis not present

## 2021-04-01 DIAGNOSIS — I4891 Unspecified atrial fibrillation: Secondary | ICD-10-CM | POA: Diagnosis not present

## 2021-04-01 DIAGNOSIS — R0789 Other chest pain: Secondary | ICD-10-CM | POA: Diagnosis not present

## 2021-04-01 DIAGNOSIS — R079 Chest pain, unspecified: Secondary | ICD-10-CM | POA: Diagnosis not present

## 2021-04-01 DIAGNOSIS — R5383 Other fatigue: Secondary | ICD-10-CM | POA: Diagnosis not present

## 2021-04-01 DIAGNOSIS — Z87891 Personal history of nicotine dependence: Secondary | ICD-10-CM | POA: Diagnosis not present

## 2021-04-01 DIAGNOSIS — E119 Type 2 diabetes mellitus without complications: Secondary | ICD-10-CM | POA: Insufficient documentation

## 2021-04-01 DIAGNOSIS — I10 Essential (primary) hypertension: Secondary | ICD-10-CM | POA: Insufficient documentation

## 2021-04-01 DIAGNOSIS — R0602 Shortness of breath: Secondary | ICD-10-CM | POA: Insufficient documentation

## 2021-04-01 LAB — BASIC METABOLIC PANEL
Anion gap: 7 (ref 5–15)
BUN: 13 mg/dL (ref 6–20)
CO2: 25 mmol/L (ref 22–32)
Calcium: 9.4 mg/dL (ref 8.9–10.3)
Chloride: 106 mmol/L (ref 98–111)
Creatinine, Ser: 0.77 mg/dL (ref 0.44–1.00)
GFR, Estimated: >60 mL/min (ref >60)
Glucose, Bld: 104 mg/dL — ABNORMAL HIGH (ref 70–99)
Potassium: 4.1 mmol/L (ref 3.5–5.1)
Sodium: 138 mmol/L (ref 135–145)

## 2021-04-01 LAB — CBC
HCT: 48.9 % — ABNORMAL HIGH (ref 36.0–46.0)
Hemoglobin: 15.7 g/dL — ABNORMAL HIGH (ref 12.0–15.0)
MCH: 29.7 pg (ref 26.0–34.0)
MCHC: 32.1 g/dL (ref 30.0–36.0)
MCV: 92.4 fL (ref 80.0–100.0)
Platelets: 212 10*3/uL (ref 150–400)
RBC: 5.29 MIL/uL — ABNORMAL HIGH (ref 3.87–5.11)
RDW: 13.2 % (ref 11.5–15.5)
WBC: 5.4 10*3/uL (ref 4.0–10.5)
nRBC: 0 % (ref 0.0–0.2)

## 2021-04-01 LAB — TSH: TSH: 1.892 u[IU]/mL (ref 0.350–4.500)

## 2021-04-01 LAB — TROPONIN I (HIGH SENSITIVITY)
Troponin I (High Sensitivity): 6 ng/L (ref <18)
Troponin I (High Sensitivity): 6 ng/L (ref <18)

## 2021-04-01 LAB — MAGNESIUM: Magnesium: 2 mg/dL (ref 1.7–2.4)

## 2021-04-01 LAB — BRAIN NATRIURETIC PEPTIDE: B Natriuretic Peptide: 230.1 pg/mL — ABNORMAL HIGH (ref 0.0–100.0)

## 2021-04-01 MED ORDER — METOPROLOL TARTRATE 25 MG PO TABS: 12.5000 mg | ORAL_TABLET | Freq: Two times a day (BID) | ORAL | 0 refills | Status: DC | PRN

## 2021-04-01 NOTE — Progress Notes (Signed)
Dr. Harl Bowie sent request for patient to have f/u with Dr. Curt Bears in 2-3 weeks - I have sent a message to our office's scheduling team requesting a follow-up appointment, and our office will call the patient with this information.

## 2021-04-01 NOTE — ED Provider Notes (Signed)
Emergency Medicine Provider Triage Evaluation Note  GAO MITNICK , a 55 y.o. female  was evaluated in triage.  Patient states that for the past 2 days she has had some chest heaviness all across her chest and pain underneath bilateral axilla.  She also states that her Apple Watch has run out A. fib multiple times in the past couple days.  She has had associated intermittent palpitations.  She also states she has had some shortness of breath episodes and feeling diaphoretic and clammy.  Denies any abdominal pain.  She does have associated nausea.  No vomiting.  Denies any leg swelling.  She is taking estradiol for previous hysterectomy.  Denies history of any blood clot.  No history of previous atrial fibrillation  Review of Systems  Positive: Palpitations, chest tightness, shortness of breath, diaphoresis, nausea Negative: Abdominal pain, vomiting, diarrhea, numbness, weakness  Physical Exam  BP (!) 155/105 (BP Location: Right Arm)   Pulse 93   Resp 17   LMP 09/23/2012   SpO2 97%  Gen:   Awake, no distress  Resp:  Normal effort.  Lungs CTA bilaterally MSK:   Moves extremities without difficulty Other:  Feels clammy on exam.  Heart in regular rate regular rhythm with no murmurs.  No lower extremity leg swelling.  Pulses 2+ bilaterally in all extremities  Medical Decision Making  Medically screening exam initiated at 10:08 AM.  Appropriate orders placed.  Jaqlyn Gruenhagen Spindel was informed that the remainder of the evaluation will be completed by another provider, this initial triage assessment does not replace that evaluation, and the importance of remaining in the ED until their evaluation is complete.    Sheila Oats 04/01/21 1010    Fredia Sorrow, MD 04/01/21 1536

## 2021-04-01 NOTE — Discharge Instructions (Addendum)
Please read and follow all provided instructions.  Your diagnoses today include:  1. Atrial fibrillation, unspecified type ([place])   2. Left chest pressure   3. Other fatigue     Tests performed today include: An EKG of your heart -shows atrial fibrillation A chest x-ray -there is a questionable artifact in the upper abdomen, please follow-up with your doctor to have this rechecked if you have any upper abdominal pain Cardiac enzymes - a blood test for heart muscle damage Blood counts and electrolytes Vital signs. See below for your results today.   Medications prescribed:  Metoprolol: Medication help control palpitations.  Do not take this if heart rate is less than 60.  Take any prescribed medications only as directed.  Follow-up instructions: Please follow-up with your primary care provider as soon as you can for further evaluation of your symptoms.   Return instructions:  SEEK IMMEDIATE MEDICAL ATTENTION IF: You have severe chest pain, especially if the pain is crushing or pressure-like and spreads to the arms, back, neck, or jaw, or if you have sweating, nausea (feeling sick to your stomach), or shortness of breath. THIS IS AN EMERGENCY. Don't wait to see if the pain will go away. Get medical help at once. Call 911 or 0 (operator). DO NOT drive yourself to the hospital.  Your chest pain gets worse and does not go away with rest.  You have an attack of chest pain lasting longer than usual, despite rest and treatment with the medications your caregiver has prescribed.  You wake from sleep with chest pain or shortness of breath. You feel dizzy or faint. You have chest pain not typical of your usual pain for which you originally saw your caregiver.  You have any other emergent concerns regarding your health.  Additional Information: Chest pain comes from many different causes. Your caregiver has diagnosed you as having chest pain that is not specific for one problem, but does not  require admission.  You are at low risk for an acute heart condition or other serious illness.   Your vital signs today were: BP 131/76   Pulse 78   Temp 98.3 F (36.8 C) (Oral)   Resp 13   LMP 09/23/2012   SpO2 100%  If your blood pressure (BP) was elevated above 135/85 this visit, please have this repeated by your doctor within one month. --------------

## 2021-04-01 NOTE — ED Triage Notes (Signed)
Pt reports heaviness across chest and under bilateral axilla x 2 days with SOB, palpitations, feeling clammy, and just not feeling right.  States her watch has alarmed A fib a few times.

## 2021-04-01 NOTE — ED Provider Notes (Signed)
[facility] EMERGENCY DEPARTMENT Provider Note   CSN: [id number] Arrival date & time: 04/01/21  4098     History Chief Complaint  Patient presents with   Chest Pain    Brittany [name] is a 55 y.o. female.  Patient with history of Crohn's disease, high cholesterol, bradycardia -- presents to the emergency department for evaluation of irregular heartbeat, chest pressure, fatigue.  Patient states that Friday night (2 nights ago) she developed some palpitations.  She has these 3 years ago and had a Holter monitor showing skipped beats.  Symptoms persisted over the past 2 days.  Today her Apple Watch told her her heart rate was irregular.  She denies lightheadedness or syncope.  She has a vague left-sided chest tightness with fatigue with activity.  She also feels some skipped beats at times.  No history of A. fib although she does have a family history.  She denies any cough or cold medications, decongestants.  She drinks a cup or 2 of coffee in the morning, otherwise no excessive caffeine use.  She does report being under a lot of stress due to health problems with a family member.  She admits that she does not sleep well at night, typically only for 5 hours.  She probably does not eat and drink as well as she should.  She reports a history of diabetes and hypertension, but she lost weight and no longer requires medication.  No history of thyroid problems.      Past Medical History:  Diagnosis Date   BMI 26.0-26.9,adult 07/06/2019   Bradycardia 08/10/2014   Crohn's disease ([place])    DDD (degenerative disc disease), lumbar 07/17/2017   Diabetes mellitus without complication ([place])    Fatigue 08/10/2014   Hyperlipidemia    Hyperlipidemia associated with type 2 diabetes mellitus ([place]) 07/06/2019   Hypertension    Hypertension, essential    Mixed hyperlipidemia    Palpitations 07/06/2019   Paresthesia 07/06/2019    Patient Active Problem List   Diagnosis Date Noted    Prediabetes 08/25/2020   Idiopathic neuropathy 08/25/2020   Myalgia 10/26/2019   Paresthesia 07/06/2019   Hyperlipidemia associated with type 2 diabetes mellitus ([place]) 07/06/2019   BMI 26.0-26.9,adult 07/06/2019   Palpitations 07/06/2019   Hypertension, essential    Mixed hyperlipidemia    DDD (degenerative disc disease), lumbar 07/17/2017   Other fatigue 08/10/2014   Bradycardia 08/10/2014   Crohn's disease ([place]) 02/24/2013    Past Surgical History:  Procedure Laterality Date   ABDOMINAL HYSTERECTOMY N/A 10/20/2012   Procedure: HYSTERECTOMY ABDOMINAL;  Surgeon: [name], MD;  Location: [facility];  Service: Gynecology;  Laterality: N/A;   ANAL FISSURE REPAIR  2002   APPENDECTOMY     SALPINGOOPHORECTOMY Bilateral 10/20/2012   Procedure: SALPINGO OOPHORECTOMY;  Surgeon: [name], MD;  Location: [facility];  Service: Gynecology;  Laterality: Bilateral;   SMALL INTESTINE SURGERY     reconstruction   TONSILLECTOMY       OB History   No obstetric history on file.     Family History  Problem Relation Age of Onset   Heart Problems Mother    Diabetes Mother    Diabetes Father    Heart Problems Father    Cancer Maternal Grandmother        lung   Peptic Ulcer Disease Other     Social History   Tobacco Use   Smoking status: Former    Types: Cigarettes    Quit date: 2002  Years since quitting: 20.8   Smokeless tobacco: Never  Vaping Use   Vaping Use: Never used  Substance Use Topics   Alcohol use: Yes   Drug use: No    Home Medications Prior to Admission medications   Medication Sig Start Date End Date Taking? Authorizing Provider  Biotin 1000 MCG tablet Take 1,000 mcg by mouth daily.    [provider]  blood glucose meter kit and supplies KIT Check sugars daily as needed. Hypoglycemia e16.2 07/25/20   [name], [name], MD  calcium-vitamin D (OSCAL WITH D) 500-200 MG-UNIT per tablet Take 1 tablet by mouth daily.    [provider]  cholecalciferol  (VITAMIN D3) 25 MCG (1000 UNIT) tablet Take 1,000 Units by mouth daily.    [provider]  estradiol (ESTRACE) 1 MG tablet TAKE 1 TABLET EVERY DAY 01/29/21   Marge Duncans, PA-C  ezetimibe (ZETIA) 10 MG tablet Take 1 tablet (10 mg total) by mouth daily. 08/25/20   Marge Duncans, PA-C  fish oil-omega-3 fatty acids 1000 MG capsule Take 1 g by mouth daily.    [provider]  gabapentin (NEURONTIN) 300 MG capsule Take 1 capsule (300 mg total) by mouth 3 (three) times daily. 11/23/20   CoxElnita Maxwell, MD  Glucosamine-Chondroit-Vit C-Mn (GLUCOSAMINE 1500 COMPLEX PO) Glucosamine    [provider]  glucose blood (ACCU-CHEK GUIDE) test strip Check FBS 3 times daily 09/25/20   [name], Kirsten, MD  Melatonin 10 MG CAPS Take 10 mg by mouth at bedtime.     [provider]  Misc Natural Products (TART CHERRY ADVANCED PO) Take by mouth daily.    [provider]  phentermine 15 MG capsule Take 1 capsule (15 mg total) by mouth every morning. 01/02/21   [name], Kirsten, MD  TURMERIC PO Take by mouth in the morning and at bedtime.    [provider]  Ustekinumab (STELARA [place]) Inject into the skin every 8 (eight) weeks.    [provider]  zolpidem (AMBIEN) 5 MG tablet Take 1 tablet (5 mg total) by mouth at bedtime as needed for sleep. 01/02/21   CoxElnita Maxwell, MD    Allergies    Mercaptopurine, Amitriptyline, Crestor [rosuvastatin calcium], Metoprolol, Trazodone and nefazodone, Zocor [simvastatin], Morphine and related, and Penicillins  Review of Systems   Review of Systems  Constitutional:  Positive for fatigue. Negative for fever.  HENT:  Negative for rhinorrhea and sore throat.   Eyes:  Negative for redness.  Respiratory:  Positive for chest tightness. Negative for cough and shortness of breath.   Gastrointestinal:  Negative for abdominal pain, diarrhea, nausea and vomiting.  Genitourinary:  Negative for dysuria, frequency, hematuria and urgency.   Musculoskeletal:  Negative for myalgias.  Skin:  Negative for rash.  Neurological:  Negative for syncope, light-headedness and headaches.   Physical Exam Updated Vital Signs BP 125/81   Pulse (!) 51   Temp 98.3 F (36.8 C) (Oral)   Resp 16   LMP 09/23/2012   SpO2 100%   Physical Exam Vitals and nursing note reviewed.  Constitutional:      General: She is not in acute distress.    Appearance: She is well-developed. She is not diaphoretic.  HENT:     Head: Normocephalic and atraumatic.     Right Ear: External ear normal.     Left Ear: External ear normal.     Nose: Nose normal.     Mouth/Throat:     Mouth: Mucous membranes are not  dry.  Eyes:     Conjunctiva/sclera: Conjunctivae normal.  Neck:     Vascular: Normal carotid pulses. No JVD.     Trachea: Trachea normal. No tracheal deviation.  Cardiovascular:     Rate and Rhythm: Normal rate. Rhythm irregular.     Pulses: No decreased pulses.          Radial pulses are 2+ on the right side and 2+ on the left side.     Heart sounds: Normal heart sounds, S1 normal and S2 normal. No murmur heard. Pulmonary:     Effort: Pulmonary effort is normal. No respiratory distress.     Breath sounds: No wheezing, rhonchi or rales.  Chest:     Chest wall: No tenderness.  Abdominal:     General: Bowel sounds are normal.     Palpations: Abdomen is soft.     Tenderness: There is no abdominal tenderness. There is no guarding or rebound.  Musculoskeletal:        General: Normal range of motion.     Cervical back: Normal range of motion and neck supple. No muscular tenderness.     Right lower leg: No edema.     Left lower leg: No edema.  Skin:    General: Skin is warm and dry.     Coloration: Skin is not pale.     Findings: No rash.  Neurological:     General: No focal deficit present.     Mental Status: She is alert. Mental status is at baseline.     Motor: No weakness.  Psychiatric:        Mood and Affect: Mood normal.    ED  Results / Procedures / Treatments   Labs (all labs ordered are listed, but only abnormal results are displayed) Labs Reviewed  BASIC METABOLIC PANEL - Abnormal; Notable for the following components:      Result Value   Glucose, Bld 104 (*)    All other components within normal limits  CBC - Abnormal; Notable for the following components:   RBC 5.29 (*)    Hemoglobin 15.7 (*)    HCT 48.9 (*)    All other components within normal limits  BRAIN NATRIURETIC PEPTIDE - Abnormal; Notable for the following components:   B Natriuretic Peptide 230.1 (*)    All other components within normal limits  MAGNESIUM  TSH  TROPONIN I (HIGH SENSITIVITY)  TROPONIN I (HIGH SENSITIVITY)    ED ECG REPORT   Date: 04/01/2021  Rate: 82  Rhythm: atrial fibrillation  QRS Axis: normal  Intervals: normal  ST/T Wave abnormalities: normal  Conduction Disutrbances:none  Narrative Interpretation:   Old EKG Reviewed: changes noted, new atrial fibrillation and faster  I have personally reviewed the EKG tracing and agree with the computerized printout as noted.   Radiology DG Chest 2 View  Result Date: 04/01/2021 CLINICAL DATA:  Chest pain, shortness of breath EXAM: CHEST - 2 VIEW COMPARISON:  07/07/2017 FINDINGS: The heart size and mediastinal contours are within normal limits. Both lungs are clear. The visualized skeletal structures are unremarkable. In the lateral view, faint calcification is seen in the anterior aspect of upper abdomen which is not distinctly visualized in the PA projection. IMPRESSION: There is no evidence of pulmonary edema or focal pneumonia. There is faint ring-like calcification measuring approximally 5 cm in the anterior upper abdomen seen in the lateral projection which is not fully evaluated. If there are any symptoms in the upper abdomen short-term follow-up radiographs  or CT may be considered. Electronically Signed   By: Elmer Picker M.D.   On: 04/01/2021 10:46     Procedures Procedures   Medications Ordered in ED Medications - No data to display  ED Course  I have reviewed the triage vital signs and the nursing notes.  Pertinent labs & imaging results that were available during my care of the patient were reviewed by me and considered in my medical decision making (see chart for details).  Patient seen and examined. Work-up reviewed and is reassuring with 2 negative troponins.  EKG with rate controlled atrial fibrillation.  Discussed case with Dr. Rogene Houston.  Will discuss plan with cardiology as patient does have some mild symptoms.  Vital signs reviewed and are as follows: BP 125/81   Pulse (!) 51   Temp 98.3 F (36.8 C) (Oral)   Resp 16   LMP 09/23/2012   SpO2 100%   Spoke with Dr. Harl Bowie of cardiology. Reviewed lab work-up today.  Plan for discharge to home.  Cardiology recommends as needed metoprolol 12.5 mg tablets 3 times daily.  Otherwise, will have office call patient for an appointment.  CHA2DS2-VASc equals 1 no anticoagulation recommended.  Updated patient and family at bedside.  They are in agreement.  Advised not to take metoprolol if heart rate is below 60 and only when symptomatic.  Patient was counseled to return with severe chest pain, especially if the pain is crushing or pressure-like and spreads to the arms, back, neck, or jaw, or if they have sweating, nausea, or shortness of breath with the pain. They were encouraged to call 911 with these symptoms.   The patient verbalized understanding and agreed.     MDM Rules/Calculators/A&P     CHA2DS2-VASc Score: 1                     Patient new onset atrial fibrillation, rate controlled.  She has chest pressure, fatigue likely related.  Troponin negative x2.  Normal TSH.  Electrolytes are okay.  EKG without other abnormalities.  Discussed with cardiology with plan as above.  They will follow-up closely.  Patient comfortable with discharge home at this time.   Final  Clinical Impression(s) / ED Diagnoses Final diagnoses:  Atrial fibrillation, unspecified type ([place])  Left chest pressure  Other fatigue    Rx / DC Orders ED Discharge Orders          Ordered    metoprolol tartrate (LOPRESSOR) 25 MG tablet  2 times daily PRN        04/01/21 1451             Carlisle Cater, PA-C 04/01/21 1500    Fredia Sorrow, MD 04/01/21 1538

## 2021-04-09 NOTE — Progress Notes (Signed)
Cardiology Office Note Date:  04/09/2021  Patient ID:  Brittany [name], DOB Oct 16, 1965, MRN [id number] PCP:  [name], MD  Cardiologist:  Dr. Radford Pax (2016) Electrophysiologist: Dr. Curt Bears    Chief Complaint: post ER visit  History of Present Illness: Brittany [name] is a 55 y.o. female with history of Chron's disease, HTN, DM, HLD  She comes in today to be seen for dr. Curt Bears, last seen by him March 2021, referred for post COVID palpitations and bradycardia noted by her PMD of 45bpm associated with fatigue. AT her visit with Dr. Curt Bears HR 66 with same degree of fatigue. EKG done by her PMD with SB, normal intervals/narrow QRS, planned to wear a monitor to better evaluate palpitations and bradycardia.  Monitor noted no AFib, very short SVTs, min HR 40early AM hours, no new recommendations or changes made.  She had an ER visit 04/01/21 w/CP, palpitations for the preceding 2-3 days, under increased personal stress, not eating/sleeping well.  She was found in rate controlled afib HS Trop neg x2 In d/w cardiology recommended prn metoprolol, given low risk score, no a/c started Discharged from the ER  K+ 4.1 BUN/Creat 13/0.77 Mag 2.0 BNP 230 (cxr clear) WBC 5.4 H/H 15/48 Plts 212 TSH 1.892 HS Trop 6,6   TODAY She knows exactly when her rhythm changed, was Nov 18th, had gotten very upset about heath news on her Mom (who passed away yesterday) an that evening felt a change in her heart beat. She felt a little heavy perhaps and her HR that is usually 50's was in the 80's-90's, and her watch alerted her to Afib She has not had any CP but did feel a ittle winded, and a sense of a heaviness in her chest, some degree of radiation to b/l axilla. This initially noted with exertion and after a few days her daughter insisted she get checked out. She has been taking the metoprolol BID every day, makes her tired but tolerating otherwise. She is familiar with AFib toi some degree, her  Mom had it, had several cardioversions and struggle with Afib. She since it started while still aware that she doesn't feel as well, no ongoing overt symptoms The awareness of her heart/heavy feeling and axilla are noted mostly now in the evenings when resting, not exertional or positional. Her Mom passed yesterday and these weeks have been quite exhausting emotionally as well, so hard for her to know how much of her symptoms or Afib, BB, and personal fatigue/stress.  She has not had any near syncope or syncope She has no bleeding or hematological history She has remote total hysterectomy     Past Medical History:  Diagnosis Date   BMI 26.0-26.9,adult 07/06/2019   Bradycardia 08/10/2014   Crohn's disease ([place])    DDD (degenerative disc disease), lumbar 07/17/2017   Diabetes mellitus without complication ([place])    Fatigue 08/10/2014   Hyperlipidemia    Hyperlipidemia associated with type 2 diabetes mellitus ([place]) 07/06/2019   Hypertension    Hypertension, essential    Mixed hyperlipidemia    Palpitations 07/06/2019   Paresthesia 07/06/2019    Past Surgical History:  Procedure Laterality Date   ABDOMINAL HYSTERECTOMY N/A 10/20/2012   Procedure: HYSTERECTOMY ABDOMINAL;  Surgeon: [name], MD;  Location: [facility];  Service: Gynecology;  Laterality: N/A;   ANAL FISSURE REPAIR  2002   APPENDECTOMY     SALPINGOOPHORECTOMY Bilateral 10/20/2012   Procedure: SALPINGO OOPHORECTOMY;  Surgeon: [name], MD;  Location: [facility];  Service: Gynecology;  Laterality: Bilateral;   SMALL INTESTINE SURGERY     reconstruction   TONSILLECTOMY      Current Outpatient Medications  Medication Sig Dispense Refill   Biotin 1000 MCG tablet Take 1,000 mcg by mouth daily.     blood glucose meter kit and supplies KIT Check sugars daily as needed. Hypoglycemia e16.2 1 each 0   calcium-vitamin D (OSCAL WITH D) 500-200 MG-UNIT per tablet Take 1 tablet by mouth daily.     cholecalciferol (VITAMIN D3) 25 MCG (1000  UNIT) tablet Take 1,000 Units by mouth daily.     estradiol (ESTRACE) 1 MG tablet TAKE 1 TABLET EVERY DAY 90 tablet 0   ezetimibe (ZETIA) 10 MG tablet Take 1 tablet (10 mg total) by mouth daily. 90 tablet 1   fish oil-omega-3 fatty acids 1000 MG capsule Take 1 g by mouth daily.     gabapentin (NEURONTIN) 300 MG capsule Take 1 capsule (300 mg total) by mouth 3 (three) times daily. 270 capsule 1   Glucosamine-Chondroit-Vit C-Mn (GLUCOSAMINE 1500 COMPLEX PO) Glucosamine     glucose blood (ACCU-CHEK GUIDE) test strip Check FBS 3 times daily 100 each 12   Melatonin 10 MG CAPS Take 10 mg by mouth at bedtime.      metoprolol tartrate (LOPRESSOR) 25 MG tablet Take 0.5 tablets (12.5 mg total) by mouth 2 (two) times daily as needed (Take for palpitations). Do not take if heart rate is 60 or below. 30 tablet 0   Misc Natural Products (TART CHERRY ADVANCED PO) Take by mouth daily.     phentermine 15 MG capsule Take 1 capsule (15 mg total) by mouth every morning. 30 capsule 2   TURMERIC PO Take by mouth in the morning and at bedtime.     Ustekinumab (STELARA [place]) Inject into the skin every 8 (eight) weeks.     zolpidem (AMBIEN) 5 MG tablet Take 1 tablet (5 mg total) by mouth at bedtime as needed for sleep. 30 tablet 1   No current facility-administered medications for this visit.    Allergies:   Mercaptopurine, Amitriptyline, Crestor [rosuvastatin calcium], Metoprolol, Trazodone and nefazodone, Zocor [simvastatin], Morphine and related, and Penicillins   Social History:  The patient  reports that she quit smoking about 20 years ago. She has never used smokeless tobacco. She reports current alcohol use. She reports that she does not use drugs.   Family History:  The patient's family history includes Cancer in her maternal grandmother; Diabetes in her father and mother; Heart Problems in her father and mother; Peptic Ulcer Disease in an other family member.  ROS:  Please see the history of present illness.     All other systems are reviewed and otherwise negative.   PHYSICAL EXAM:  VS:  LMP 09/23/2012  BMI: There is no height or weight on file to calculate BMI. Well nourished, well developed, in no acute distress HEENT: normocephalic, atraumatic Neck: no JVD, carotid bruits or masses Cardiac:  irreg-irreg; no significant murmurs, no rubs, or gallops Lungs:  CTA b/l, no wheezing, rhonchi or rales Abd: soft, nontender MS: no deformity or atrophy Ext: no edema Skin: warm and dry, no rash Neuro:  No gross deficits appreciated Psych: euthymic mood, full affect   EKG:  Done today and reviewed by myself shows  AFib 85bpm, no ischemic looking changes  March 2021 Max 193 bpm 02:16pm, 03/12 Min 40 bpm 05:22am, 03/13 Avg 61 bpm <1% PACs and PVCs Zero atrial fibrillation 29 SVT runs  occurred, the run with the fastest interval lasting 5 beats with a max rate of 193 bpm, the longest lasting 12.3 secs with an avg rate of 145 bpm. Symptoms associated with 4 beat run of SVT as well as sinus rhythm.    09/13/2014: TTE Study Conclusions  - Left ventricle: The cavity size was normal. Wall thickness was    normal. Systolic function was normal. The estimated ejection    fraction was in the range of 60% to 65%. Wall motion was normal;    there were no regional wall motion abnormalities. Left    ventricular diastolic function parameters were normal.  - Left atrium: The atrium was normal in size.  - Tricuspid valve: There was mild regurgitation.  - Pulmonary arteries: PA peak pressure: 32 mm Hg (S).  - Inferior vena cava: The vessel was normal in size. The    respirophasic diameter changes were in the normal range (= 50%),    consistent with normal central venous pressure.   Impressions:  - Essentially normal study - there is mild TR with borderline    elevated RVSP.   Recent Labs: 01/02/2021: ALT 11 04/01/2021: B Natriuretic Peptide 230.1; BUN 13; Creatinine, Ser 0.77; Hemoglobin 15.7; Magnesium  2.0; Platelets 212; Potassium 4.1; Sodium 138; TSH 1.892  01/02/2021: Chol/HDL Ratio 2.9; Cholesterol, Total 232; HDL 81; LDL Chol Calc (NIH) 129; Triglycerides 126   CrCl cannot be calculated (Unknown ideal weight.).   Wt Readings from Last 3 Encounters:  01/02/21 170 lb 9.6 oz (77.4 kg)  08/28/20 169 lb 3.2 oz (76.7 kg)  08/25/20 168 lb (76.2 kg)     Other studies reviewed: Additional studies/records reviewed today include: summarized above  ASSESSMENT AND PLAN:  New onset  AFib Rate controlled CHA2DS2Vasc is one for DM (2 including gender), she has a remote hx of HTN it seems was on lisinopril though with weight loss taken off medicine years ago. (?3)  Symptoms are likely her Afib No classic anginal sounding symptoms, HS Trop were ned  We discussed rational for a/c with a mindset towards rhythm control She is familiar with cardioversion We discussed rational for [facility], she is agreeable though had an initial negative response to Eliquis having heard bad things about that drug.  Will get echo, stress test (discussed with her) and start Xarelto $RemoveBefor'20mg'dWInkIUlpgvI$  daily,discussed importance of not missing any doses See her back in 4 weeks, sooner if needed     Disposition: F/u as above  Current medicines are reviewed at length with the patient today.  The patient did not have any concerns regarding medicines.  Venetia Night, PA-C 04/09/2021 5:40 AM     [facility] [address] [place] [address] [phone] (office)  [phone] (fax)

## 2021-04-10 ENCOUNTER — Encounter: Payer: Self-pay | Admitting: *Deleted

## 2021-04-10 ENCOUNTER — Other Ambulatory Visit: Payer: Self-pay

## 2021-04-10 ENCOUNTER — Ambulatory Visit (INDEPENDENT_AMBULATORY_CARE_PROVIDER_SITE_OTHER): Payer: Medicare HMO | Admitting: Family Medicine

## 2021-04-10 ENCOUNTER — Encounter: Payer: Self-pay | Admitting: Physician Assistant

## 2021-04-10 ENCOUNTER — Ambulatory Visit: Payer: Medicare HMO | Admitting: Physician Assistant

## 2021-04-10 VITALS — BP 100/60 | HR 85 | Ht 65.5 in | Wt 175.8 lb

## 2021-04-10 VITALS — BP 114/68 | HR 68 | Temp 97.3°F | Resp 16 | Ht 66.0 in | Wt 175.0 lb

## 2021-04-10 DIAGNOSIS — E782 Mixed hyperlipidemia: Secondary | ICD-10-CM | POA: Diagnosis not present

## 2021-04-10 DIAGNOSIS — K5080 Crohn's disease of both small and large intestine without complications: Secondary | ICD-10-CM

## 2021-04-10 DIAGNOSIS — R079 Chest pain, unspecified: Secondary | ICD-10-CM

## 2021-04-10 DIAGNOSIS — E785 Hyperlipidemia, unspecified: Secondary | ICD-10-CM | POA: Diagnosis not present

## 2021-04-10 DIAGNOSIS — E1169 Type 2 diabetes mellitus with other specified complication: Secondary | ICD-10-CM

## 2021-04-10 DIAGNOSIS — E538 Deficiency of other specified B group vitamins: Secondary | ICD-10-CM

## 2021-04-10 DIAGNOSIS — I4891 Unspecified atrial fibrillation: Secondary | ICD-10-CM

## 2021-04-10 MED ORDER — RIVAROXABAN 20 MG PO TABS: 20.0000 mg | ORAL_TABLET | Freq: Every day | ORAL | 1 refills | Status: DC

## 2021-04-10 NOTE — Progress Notes (Signed)
Subjective:  Patient ID: Brittany [name], female    DOB: Oct 03, 1965  Age: 55 y.o. MRN: [id number]  Chief Complaint  Patient presents with   Hyperlipidemia   Diabetes    HPI Patient is a 55 year old white female with well-controlled diabetes, hyperlipidemia, and Crohn's disease who presents for routine follow-up.  On Sunday, November 20 she presented to [facility] with mild chest discomfort and shortness of breath and was found to be in rate controlled atrial fibrillation.  She has an appointment with Dr. Curt Bears later today.  She is limiting her caffeine intake.  She is avoiding decongestants.  She is under a significant amount of stress.  Her mother's died yesterday after being diagnosed with leukemia and progressively worsening over the last few weeks.           Current Outpatient Medications on File Prior to Visit  Medication Sig Dispense Refill   Biotin 1000 MCG tablet Take 1,000 mcg by mouth daily.     blood glucose meter kit and supplies KIT Check sugars daily as needed. Hypoglycemia e16.2 1 each 0   calcium-vitamin D (OSCAL WITH D) 500-200 MG-UNIT per tablet Take 1 tablet by mouth daily.     cholecalciferol (VITAMIN D3) 25 MCG (1000 UNIT) tablet Take 1,000 Units by mouth daily.     estradiol (ESTRACE) 1 MG tablet TAKE 1 TABLET EVERY DAY 90 tablet 0   ezetimibe (ZETIA) 10 MG tablet Take 1 tablet (10 mg total) by mouth daily. 90 tablet 1   fish oil-omega-3 fatty acids 1000 MG capsule Take 1 g by mouth daily.     gabapentin (NEURONTIN) 300 MG capsule Take 1 capsule (300 mg total) by mouth 3 (three) times daily. 270 capsule 1   Glucosamine-Chondroit-Vit C-Mn (GLUCOSAMINE 1500 COMPLEX PO) Glucosamine     glucose blood (ACCU-CHEK GUIDE) test strip Check FBS 3 times daily 100 each 12   metoprolol tartrate (LOPRESSOR) 25 MG tablet Take 0.5 tablets (12.5 mg total) by mouth 2 (two) times daily as needed (Take for palpitations). Do not take if heart rate is 60 or below. 30 tablet 0    Misc Natural Products (TART CHERRY ADVANCED PO) Take by mouth daily.     TURMERIC PO Take by mouth in the morning and at bedtime.     Ustekinumab (STELARA [place]) Inject into the skin every 8 (eight) weeks.     zolpidem (AMBIEN) 5 MG tablet Take 1 tablet (5 mg total) by mouth at bedtime as needed for sleep. 30 tablet 1   No current facility-administered medications on file prior to visit.   Past Medical History:  Diagnosis Date   BMI 26.0-26.9,adult 07/06/2019   Bradycardia 08/10/2014   Crohn's disease ([place])    DDD (degenerative disc disease), lumbar 07/17/2017   Diabetes mellitus without complication ([place])    Fatigue 08/10/2014   Hyperlipidemia    Hyperlipidemia associated with type 2 diabetes mellitus ([place]) 07/06/2019   Hypertension    Hypertension, essential    Mixed hyperlipidemia    Palpitations 07/06/2019   Paresthesia 07/06/2019   Past Surgical History:  Procedure Laterality Date   ABDOMINAL HYSTERECTOMY N/A 10/20/2012   Procedure: HYSTERECTOMY ABDOMINAL;  Surgeon: [name], MD;  Location: [facility];  Service: Gynecology;  Laterality: N/A;   ANAL FISSURE REPAIR  2002   APPENDECTOMY     SALPINGOOPHORECTOMY Bilateral 10/20/2012   Procedure: SALPINGO OOPHORECTOMY;  Surgeon: [name], MD;  Location: [facility];  Service: Gynecology;  Laterality: Bilateral;   SMALL INTESTINE  SURGERY     reconstruction   TONSILLECTOMY      Family History  Problem Relation Age of Onset   Heart Problems Mother    Diabetes Mother    Diabetes Father    Heart Problems Father    Cancer Maternal Grandmother        lung   Peptic Ulcer Disease Other    Social History   Socioeconomic History   Marital status: Widowed    Spouse name: Not on file   Number of children: 1   Years of education: Not on file   Highest education level: Not on file  Occupational History   Not on file  Tobacco Use   Smoking status: Former    Types: Cigarettes    Quit date: 2002    Years since quitting: 20.9   Smokeless  tobacco: Never  Vaping Use   Vaping Use: Never used  Substance and Sexual Activity   Alcohol use: Yes   Drug use: No   Sexual activity: Not on file  Other Topics Concern   Not on file  Social History Narrative   wears sunscreen, brushes and flosses daily, see's dentist bi-annually, has smoke/carbon monoxide detectors, wears a seatbelt and practices gun safety   Social Determinants of Health   Financial Resource Strain: Not on file  Food Insecurity: Not on file  Transportation Needs: Not on file  Physical Activity: Not on file  Stress: Not on file  Social Connections: Not on file    Review of Systems  Constitutional:  Positive for fatigue. Negative for chills and fever.  HENT:  Positive for congestion and sore throat. Negative for rhinorrhea.   Respiratory:  Positive for cough. Negative for shortness of breath.   Cardiovascular:  Negative for chest pain.  Gastrointestinal:  Positive for diarrhea. Negative for abdominal pain, constipation, nausea and vomiting.  Genitourinary:  Negative for dysuria and urgency.  Musculoskeletal:  Positive for arthralgias and back pain. Negative for myalgias.  Neurological:  Negative for dizziness, weakness, light-headedness and headaches.  Psychiatric/Behavioral:  Negative for dysphoric mood. The patient is not nervous/anxious.     Objective:  BP 114/68   Pulse 68   Temp (!) 97.3 F (36.3 C)   Resp 16   Ht _0  (1.676 m)   Wt 175 lb (79.4 kg)   LMP 09/23/2012   BMI 28.25 kg/m   BP/Weight 04/10/2021 04/10/2021 78/29/5621  Systolic BP 308 657 846  Diastolic BP 60 68 94  Wt. (Lbs) 175.8 175 -  BMI 28.81 28.25 -    Physical Exam Vitals reviewed.  Constitutional:      Appearance: Normal appearance. She is normal weight.  HENT:     Right Ear: Tympanic membrane, ear canal and external ear normal.     Left Ear: Tympanic membrane, ear canal and external ear normal.     Nose: Nose normal.     Mouth/Throat:     Pharynx: Oropharynx is  clear.  Neck:     Vascular: No carotid bruit.  Cardiovascular:     Rate and Rhythm: Normal rate. Rhythm irregular.     Pulses: Normal pulses.     Heart sounds: Normal heart sounds. No murmur heard. Pulmonary:     Effort: Pulmonary effort is normal. No respiratory distress.     Breath sounds: Normal breath sounds.  Abdominal:     General: Abdomen is flat. Bowel sounds are normal.     Palpations: Abdomen is soft.     Tenderness:  There is no abdominal tenderness.  Neurological:     Mental Status: She is alert and oriented to person, place, and time.  Psychiatric:        Mood and Affect: Mood normal.        Behavior: Behavior normal.    Diabetic Foot Exam - Simple   No data filed      Lab Results  Component Value Date   WBC 5.7 04/10/2021   HGB 14.3 04/10/2021   HCT 42.8 04/10/2021   PLT 195 04/10/2021   GLUCOSE 93 04/10/2021   CHOL 204 (H) 04/10/2021   TRIG 143 04/10/2021   HDL 70 04/10/2021   LDLCALC 109 (H) 04/10/2021   ALT 11 04/10/2021   AST 15 04/10/2021   NA 141 04/10/2021   K 4.6 04/10/2021   CL 104 04/10/2021   CREATININE 0.68 04/10/2021   BUN 12 04/10/2021   CO2 26 04/10/2021   TSH 1.892 04/01/2021   INR 0.86 10/20/2012   HGBA1C 5.6 04/10/2021   MICROALBUR 30 08/25/2020      Assessment & Plan:   Problem List Items Addressed This Visit       Cardiovascular and Mediastinum   New onset atrial fibrillation ([place])    Keep appt with Dr. Curt Bears.       Relevant Orders   CBC with Differential/Platelet (Completed)   Comprehensive metabolic panel (Completed)     Endocrine   Hyperlipidemia associated with type 2 diabetes mellitus ([place]) - Primary    Control: great Recommend check feet daily. Recommend annual eye exams. Medicines: no changes Continue to work on eating a healthy diet and exercise.  Labs drawn today.         Relevant Orders   Hemoglobin A1c (Completed)     Other   Mixed hyperlipidemia    Continue fish oil and zeita.       Relevant Orders   Lipid panel (Completed)   Crohn's disease ([place])    The current medical regimen is effective;  continue present plan and medications. Continue stelara.       B12 deficiency   Relevant Orders   Vitamin B12 (Completed)  .  No orders of the defined types were placed in this encounter.   Orders Placed This Encounter  Procedures   CBC with Differential/Platelet   Comprehensive metabolic panel   Vitamin T86   Lipid panel   Hemoglobin A1c     Follow-up: Return in about 4 months (around 08/08/2021) for chronic fasting.  An After Visit Summary was printed and given to the patient.  [name], MD [facility] [phone]

## 2021-04-10 NOTE — Patient Instructions (Addendum)
Medication Instructions:   START  TAKING XARELTO 20 MG ONCE A DAY   *If you need a refill on your cardiac medications before your next appointment, please call your pharmacy*   Lab Work: NONE ORDERED  TODAY    If you have labs (blood work) drawn today and your tests are completely normal, you will receive your results only by: [place] (if you have MyChart) OR A paper copy in the mail If you have any lab test that is abnormal or we need to change your treatment, we will call you to review the results.   Testing/Procedures: Your physician has requested that you have an echocardiogram. Echocardiography is a painless test that uses sound waves to create images of your heart. It provides your doctor with information about the size and shape of your heart and how well your heart's chambers and valves are working. This procedure takes approximately one hour. There are no restrictions for this procedure.  Your physician has requested that you have en exercise stress myoview. For further information please visit [email or web address]. Please follow instruction sheet, as given.    Follow-Up: At [facility], you and your health needs are our priority.  As part of our continuing mission to provide you with exceptional heart care, we have created designated Provider Care Teams.  These Care Teams include your primary Cardiologist (physician) and Advanced Practice Providers (APPs -  Physician Assistants and Nurse Practitioners) who all work together to provide you with the care you need, when you need it.  We recommend signing up for the patient portal called "MyChart".  Sign up information is provided on this After Visit Summary.  MyChart is used to connect with patients for Virtual Visits (Telemedicine).  Patients are able to view lab/test results, encounter notes, upcoming appointments, etc.  Non-urgent messages can be sent to your provider as well.   To learn more about what you can do with  MyChart, go to [email or web address].    Your next appointment:   4 week(s)  ( CONTACT [facility] FOR EP SCHEDULING ISSUES )   The format for your next appointment:   In Person  Provider:   Allegra Lai, MD{    Other Instructions

## 2021-04-10 NOTE — Patient Instructions (Addendum)
Recommend mucinex or mucinex dm (guaifenacin and dextromethorphan). May use flonase

## 2021-04-10 NOTE — Addendum Note (Signed)
Addended by: Claude Manges on: 04/10/2021 01:38 PM   Modules accepted: Orders

## 2021-04-11 ENCOUNTER — Telehealth (HOSPITAL_COMMUNITY): Payer: Self-pay | Admitting: *Deleted

## 2021-04-11 LAB — HEMOGLOBIN A1C
Est. average glucose Bld gHb Est-mCnc: 114 mg/dL
Hgb A1c MFr Bld: 5.6 % (ref 4.8–5.6)

## 2021-04-11 LAB — COMPREHENSIVE METABOLIC PANEL
ALT: 11 IU/L (ref 0–32)
AST: 15 IU/L (ref 0–40)
Albumin/Globulin Ratio: 1.8 (ref 1.2–2.2)
Albumin: 4.1 g/dL (ref 3.8–4.9)
Alkaline Phosphatase: 58 IU/L (ref 44–121)
BUN/Creatinine Ratio: 18 (ref 9–23)
BUN: 12 mg/dL (ref 6–24)
Bilirubin Total: 0.5 mg/dL (ref 0.0–1.2)
CO2: 26 mmol/L (ref 20–29)
Calcium: 9.5 mg/dL (ref 8.7–10.2)
Chloride: 104 mmol/L (ref 96–106)
Creatinine, Ser: 0.68 mg/dL (ref 0.57–1.00)
Globulin, Total: 2.3 g/dL (ref 1.5–4.5)
Glucose: 93 mg/dL (ref 70–99)
Potassium: 4.6 mmol/L (ref 3.5–5.2)
Sodium: 141 mmol/L (ref 134–144)
Total Protein: 6.4 g/dL (ref 6.0–8.5)
eGFR: 102 mL/min/{1.73_m2} (ref >59)

## 2021-04-11 LAB — CBC WITH DIFFERENTIAL/PLATELET
Basophils Absolute: 0.1 10*3/uL (ref 0.0–0.2)
Basos: 1 %
EOS (ABSOLUTE): 0.1 10*3/uL (ref 0.0–0.4)
Eos: 2 %
Hematocrit: 42.8 % (ref 34.0–46.6)
Hemoglobin: 14.3 g/dL (ref 11.1–15.9)
Immature Grans (Abs): 0 10*3/uL (ref 0.0–0.1)
Immature Granulocytes: 0 %
Lymphocytes Absolute: 1.4 10*3/uL (ref 0.7–3.1)
Lymphs: 25 %
MCH: 29.9 pg (ref 26.6–33.0)
MCHC: 33.4 g/dL (ref 31.5–35.7)
MCV: 89 fL (ref 79–97)
Monocytes Absolute: 0.5 10*3/uL (ref 0.1–0.9)
Monocytes: 9 %
Neutrophils Absolute: 3.6 10*3/uL (ref 1.4–7.0)
Neutrophils: 63 %
Platelets: 195 10*3/uL (ref 150–450)
RBC: 4.79 x10E6/uL (ref 3.77–5.28)
RDW: 12.3 % (ref 11.7–15.4)
WBC: 5.7 10*3/uL (ref 3.4–10.8)

## 2021-04-11 LAB — LIPID PANEL
Chol/HDL Ratio: 2.9 ratio (ref 0.0–4.4)
Cholesterol, Total: 204 mg/dL — ABNORMAL HIGH (ref 100–199)
HDL: 70 mg/dL (ref >39)
LDL Chol Calc (NIH): 109 mg/dL — ABNORMAL HIGH (ref 0–99)
Triglycerides: 143 mg/dL (ref 0–149)
VLDL Cholesterol Cal: 25 mg/dL (ref 5–40)

## 2021-04-11 LAB — VITAMIN B12: Vitamin B-12: 555 pg/mL (ref 232–1245)

## 2021-04-11 NOTE — Addendum Note (Signed)
Addended by: France Ravens on: 04/11/2021 09:27 AM   Modules accepted: Orders

## 2021-04-11 NOTE — Telephone Encounter (Signed)
Patient given detailed instructions per Myocardial Perfusion Study Information Sheet for the test on 04/18/21  Patient notified to arrive 15 minutes early and that it is imperative to arrive on time for appointment to keep from having the test rescheduled.  If you need to cancel or reschedule your appointment, please call the office within 24 hours of your appointment. . Patient verbalized understanding. Antwan Pandya Jacqueline   

## 2021-04-17 DIAGNOSIS — K50919 Crohn's disease, unspecified, with unspecified complications: Principal | ICD-10-CM

## 2021-04-18 ENCOUNTER — Ambulatory Visit (HOSPITAL_COMMUNITY): Payer: Medicare HMO | Attending: Cardiovascular Disease

## 2021-04-18 ENCOUNTER — Other Ambulatory Visit: Payer: Self-pay

## 2021-04-18 DIAGNOSIS — I4891 Unspecified atrial fibrillation: Secondary | ICD-10-CM | POA: Insufficient documentation

## 2021-04-18 LAB — MYOCARDIAL PERFUSION IMAGING
LV dias vol: 80 mL (ref 46–106)
LV sys vol: 40 mL
Nuc Stress EF: 50 %
Peak HR: 94 {beats}/min
Rest HR: 92 {beats}/min
Rest Nuclear Isotope Dose: 10.2 mCi
SDS: 4
SRS: 0
SSS: 4
ST Depression (mm): 0 mm
Stress Nuclear Isotope Dose: 32.3 mCi
TID: 1.21

## 2021-04-18 MED ORDER — TECHNETIUM TC 99M TETROFOSMIN IV KIT
32.3000 | PACK | Freq: Once | INTRAVENOUS | Status: AC | PRN
  Administered 2021-04-18: 32.3 via INTRAVENOUS
  Filled 2021-04-18: qty 33

## 2021-04-18 MED ORDER — TECHNETIUM TC 99M TETROFOSMIN IV KIT
10.2000 | PACK | Freq: Once | INTRAVENOUS | Status: AC | PRN
  Administered 2021-04-18: 10.2 via INTRAVENOUS
  Filled 2021-04-18: qty 11

## 2021-04-18 MED ORDER — REGADENOSON 0.4 MG/5ML IV SOLN
0.4000 mg | Freq: Once | INTRAVENOUS | Status: AC
  Administered 2021-04-18: 0.4 mg via INTRAVENOUS

## 2021-04-23 ENCOUNTER — Encounter: Payer: Self-pay | Admitting: Family Medicine

## 2021-04-23 DIAGNOSIS — I4811 Longstanding persistent atrial fibrillation: Secondary | ICD-10-CM | POA: Insufficient documentation

## 2021-04-23 DIAGNOSIS — E538 Deficiency of other specified B group vitamins: Secondary | ICD-10-CM | POA: Insufficient documentation

## 2021-04-23 DIAGNOSIS — I4891 Unspecified atrial fibrillation: Secondary | ICD-10-CM | POA: Insufficient documentation

## 2021-04-23 DIAGNOSIS — I4819 Other persistent atrial fibrillation: Secondary | ICD-10-CM | POA: Insufficient documentation

## 2021-04-23 NOTE — Assessment & Plan Note (Signed)
Keep appt with Dr. Curt Bears.

## 2021-04-23 NOTE — Assessment & Plan Note (Addendum)
The current medical regimen is effective;  continue present plan and medications. Continue stelara.

## 2021-04-23 NOTE — Assessment & Plan Note (Addendum)
Continue fish oil and zeita.

## 2021-04-23 NOTE — Assessment & Plan Note (Signed)
Control: great Recommend check feet daily. Recommend annual eye exams. Medicines: no changes Continue to work on eating a healthy diet and exercise.  Labs drawn today.

## 2021-04-25 ENCOUNTER — Telehealth: Payer: Self-pay

## 2021-04-25 NOTE — Telephone Encounter (Signed)
Brittany [name] called requesting something for a vaginal infection.  She wanted something called into the pharmacy.  I explained that Dr. Tobie Poet is out of the office and she is going to call her GYN.

## 2021-04-27 NOTE — Unmapped (Signed)
[facility] Clinical Assessment & Refill Coordination Note    [name] is approved for [employer] to assist with financial coverage of Stelara in 2023.     Brenda Beard, DOB: 07/25/1965  Phone: [phone] (home)     All above HIPAA information was verified with patient.     Was a Nurse, learning disability used for this call? No    Specialty Medication(s):   Inflammatory Disorders: Stelara     Current Outpatient Medications   Medication Sig Dispense Refill   ??? biotin 1 mg tablet Take 1,000 mcg by mouth every morning before breakfast.     ??? estradiol (ESTRACE) 1 MG tablet Take 1 mg by mouth nightly.      ??? ezetimibe (ZETIA) 10 mg tablet Take 10 mg by mouth daily.     ??? gabapentin (NEURONTIN) 300 MG capsule Take 900 mg by mouth nightly.     ??? glucosamine-chondroitin 500-400 mg tablet Take 2 tablets by mouth nightly.      ??? metoprolol tartrate (LOPRESSOR) 25 MG tablet Take 12.5 mg by mouth Two (2) times a day.     ??? omega-3 fatty acids-fish oil 300-1,000 mg cap capsule Take 1 capsule by mouth daily. Last taken 05/26/15     ??? rivaroxaban (XARELTO) 20 mg tablet Take 20 mg by mouth daily with evening meal.     ??? UNABLE TO FIND Take 1,000 mg by mouth two (2) times a day. Med Name: tumeric     ??? UNABLE TO FIND Take 1 capsule by mouth two (2) times a day. Med Name: cherry extract     ??? ustekinumab (STELARA) 90 mg/mL Syrg syringe Inject the contents of 1 syringe (90 mg total) under the skin every 8 weeks. 1 mL 5     No current facility-administered medications for this visit.        Changes to medications: [place] reports starting the following medications: metoprolol 12.5mg  BID and Xarelto 20 mg daily    Allergies   Allergen Reactions   ??? Mercaptopurine Other (See Comments)     Other reaction(s): Pancreatitis.  Used to treat pt. Crohn's Disease.  Caused Pancreatis 1992.       Other reaction(s): Other (See Comments)  Used to treat pt. Crohn's Disease.  Caused Pancreatis 1992.   ??? Amitriptyline    ??? Metoprolol Other (See Comments)     Extreme fatigue   ??? Simvastatin    ??? Trazodone Hcl    ??? Morphine Itching     States can take short term.   ??? Penicillins Rash             Changes to allergies: No    SPECIALTY MEDICATION ADHERENCE     Stelara 90 mg/ml: 0 days of medicine on hand     Medication Adherence    Patient reported X missed doses in the last month: 0  Specialty Medication: Stelara 90 mg/mL - every 8 weeks  Patient is on additional specialty medications: No  Informant: patient          Specialty medication(s) dose(s) confirmed: Regimen is correct and unchanged.     Are there any concerns with adherence? No    Adherence counseling provided? Not needed    CLINICAL MANAGEMENT AND INTERVENTION      Clinical Benefit Assessment:    Do you feel the medicine is effective or helping your condition? Yes    Clinical Benefit counseling provided? Not needed    Adverse Effects Assessment:  Are you experiencing any side effects? No    Are you experiencing difficulty administering your medicine? No    Quality of Life Assessment:    Quality of Life    Rheumatology  Oncology  Dermatology  Cystic Fibrosis          How many days over the past month did your crohn's disease  keep you from your normal activities? For example, brushing your teeth or getting up in the morning. 0    Have you discussed this with your provider? Not needed    Acute Infection Status:    Acute infections noted within Epic:  No active infections  Patient reported infection: None    Therapy Appropriateness:    Is therapy appropriate and patient progressing towards therapeutic goals? Yes, therapy is appropriate and should be continued    DISEASE/MEDICATION-SPECIFIC INFORMATION      For patients on injectable medications: Patient currently has 0 doses left.  Next injection is scheduled for 05/13/21.    PATIENT SPECIFIC NEEDS     - Does the patient have any physical, cognitive, or cultural barriers? No    - Is the patient high risk? No    - Does the patient require a Care Management Plan? No     SOCIAL DETERMINANTS OF HEALTH     At the [facility], we have learned that life circumstances - like trouble affording food, housing, utilities, or transportation can affect the health of many of our patients.   That is why we wanted to ask: are you currently experiencing any life circumstances that are negatively impacting your health and/or quality of life? Patient declined to answer    Social Determinants of Health     Food Insecurity: Not on file   Tobacco Use: Medium Risk   ??? Smoking Tobacco Use: Former   ??? Smokeless Tobacco Use: Never   ??? Passive Exposure: Not on file   Transportation Needs: Not on file   Alcohol Use: Not on file   Housing/Utilities: Not on file   Substance Use: Not on file   Financial Resource Strain: Not on file   Physical Activity: Not on file   Health Literacy: Not on file   Stress: Not on file   Intimate Partner Violence: Not on file   Depression: Not on file   Social Connections: Not on file       Would you be willing to receive help with any of the needs that you have identified today? Not applicable       SHIPPING     Specialty Medication(s) to be Shipped:   Inflammatory Disorders: Stelara    Other medication(s) to be shipped: No additional medications requested for fill at this time     Changes to insurance: No    Delivery Scheduled: Yes, Expected medication delivery date: 05/09/21.     Medication will be delivered via UPS to the confirmed prescription address in [facility].    The patient will receive a drug information handout for each medication shipped and additional FDA Medication Guides as required.  Verified that patient has previously received a Conservation officer, historic buildings and a Surveyor, mining.    The patient or caregiver noted above participated in the development of this care plan and knows that they can request review of or adjustments to the care plan at any time.      All of the patient's questions and concerns have been addressed.    [name]  [facility] Specialty Pharmacist

## 2021-05-01 ENCOUNTER — Other Ambulatory Visit: Payer: Self-pay

## 2021-05-01 ENCOUNTER — Ambulatory Visit (HOSPITAL_COMMUNITY): Payer: Medicare HMO | Attending: Cardiology

## 2021-05-01 DIAGNOSIS — I4891 Unspecified atrial fibrillation: Secondary | ICD-10-CM | POA: Diagnosis not present

## 2021-05-01 LAB — ECHOCARDIOGRAM COMPLETE
Area-P 1/2: 3.53 cm2
S' Lateral: 3.3 cm

## 2021-05-04 ENCOUNTER — Telehealth: Payer: Self-pay

## 2021-05-04 MED ORDER — METOPROLOL TARTRATE 25 MG PO TABS: 12.5000 mg | ORAL_TABLET | Freq: Two times a day (BID) | ORAL | 0 refills | Status: DC | PRN

## 2021-05-04 NOTE — Telephone Encounter (Signed)
Received call from patient requesting a refill on Metoprolol.   Chart reviewed.  Rx(s) sent to pharmacy electronically.  Patient notified and voiced understanding.

## 2021-05-08 MED FILL — STELARA 90 MG/ML SUBCUTANEOUS SYRINGE: SUBCUTANEOUS | 56 days supply | Qty: 1 | Fill #3

## 2021-05-15 ENCOUNTER — Encounter: Payer: Self-pay | Admitting: Cardiology

## 2021-05-15 ENCOUNTER — Telehealth: Payer: Self-pay | Admitting: Cardiology

## 2021-05-15 ENCOUNTER — Ambulatory Visit: Payer: Medicare HMO | Admitting: Cardiology

## 2021-05-15 ENCOUNTER — Encounter: Payer: Self-pay | Admitting: *Deleted

## 2021-05-15 ENCOUNTER — Other Ambulatory Visit: Payer: Self-pay

## 2021-05-15 VITALS — BP 146/88 | HR 93 | Ht 65.0 in | Wt 176.0 lb

## 2021-05-15 DIAGNOSIS — E785 Hyperlipidemia, unspecified: Secondary | ICD-10-CM

## 2021-05-15 DIAGNOSIS — I10 Essential (primary) hypertension: Secondary | ICD-10-CM | POA: Diagnosis not present

## 2021-05-15 DIAGNOSIS — R002 Palpitations: Secondary | ICD-10-CM | POA: Diagnosis not present

## 2021-05-15 DIAGNOSIS — R001 Bradycardia, unspecified: Secondary | ICD-10-CM | POA: Diagnosis not present

## 2021-05-15 MED ORDER — METOPROLOL SUCCINATE ER 25 MG PO TB24: 25.0000 mg | ORAL_TABLET | Freq: Every day | ORAL | 3 refills | Status: DC

## 2021-05-15 NOTE — Progress Notes (Signed)
Electrophysiology Office Note   Date:  05/15/2021   ID:  Brittany [name], DOB Mar 27, 1966, MRN [id number]  PCP:  [name], MD  Cardiologist:   Primary Electrophysiologist:  Andray Assefa Meredith Leeds, MD    Chief Complaint: bradycardia   History of Present Illness: Brittany [name] is a 56 y.o. female who is being seen today for the evaluation of bradycardia at the request of [name], [name], MD. Presenting today for electrophysiology evaluation.  She has a history significant for diabetes, hypertension, hyperlipidemia, Crohn's disease.  She presented to cardiology clinic post COVID with palpitations and bradycardia.  She was noted to have heart rates in the 40s associated with fatigue.  She did present to the emergency room 04/01/2021 with chest pain and palpitations.  She was found to be in atrial fibrillation at that time.  She felt that she went into atrial fibrillation November 18, after her mother passed away.  Since being on metoprolol, she had had fatigue.  Today, denies symptoms of palpitations, chest pain,  orthopnea, PND, lower extremity edema, claudication, dizziness, presyncope, syncope, bleeding, or neurologic sequela. The patient is tolerating medications without difficulties.  Unfortunately she has continued to have fatigue and shortness of breath.  She feels poorly in atrial fibrillation and would like it back into normal rhythm.   Past Medical History:  Diagnosis Date   BMI 26.0-26.9,adult 07/06/2019   Bradycardia 08/10/2014   Crohn's disease ([place]    DDD (degenerative disc disease), lumbar 07/17/2017   Diabetes mellitus without complication ([place])    Fatigue 08/10/2014   Hyperlipidemia    Hyperlipidemia associated with type 2 diabetes mellitus ([place]) 07/06/2019   Hypertension    Hypertension, essential    Mixed hyperlipidemia    Palpitations 07/06/2019   Paresthesia 07/06/2019   Past Surgical History:  Procedure Laterality Date   ABDOMINAL HYSTERECTOMY N/A 10/20/2012    Procedure: HYSTERECTOMY ABDOMINAL;  Surgeon: [name], MD;  Location: [facility];  Service: Gynecology;  Laterality: N/A;   ANAL FISSURE REPAIR  2002   APPENDECTOMY     SALPINGOOPHORECTOMY Bilateral 10/20/2012   Procedure: SALPINGO OOPHORECTOMY;  Surgeon: [name], MD;  Location: [facility];  Service: Gynecology;  Laterality: Bilateral;   SMALL INTESTINE SURGERY     reconstruction   TONSILLECTOMY       Current Outpatient Medications  Medication Sig Dispense Refill   Biotin 1000 MCG tablet Take 1,000 mcg by mouth daily.     blood glucose meter kit and supplies KIT Check sugars daily as needed. Hypoglycemia e16.2 1 each 0   calcium-vitamin D (OSCAL WITH D) 500-200 MG-UNIT per tablet Take 1 tablet by mouth daily.     cholecalciferol (VITAMIN D3) 25 MCG (1000 UNIT) tablet Take 1,000 Units by mouth daily.     estradiol (ESTRACE) 1 MG tablet TAKE 1 TABLET EVERY DAY 90 tablet 0   ezetimibe (ZETIA) 10 MG tablet Take 1 tablet (10 mg total) by mouth daily. 90 tablet 1   fish oil-omega-3 fatty acids 1000 MG capsule Take 1 g by mouth daily.     gabapentin (NEURONTIN) 300 MG capsule Take 1 capsule (300 mg total) by mouth 3 (three) times daily. 270 capsule 1   Glucosamine-Chondroit-Vit C-Mn (GLUCOSAMINE 1500 COMPLEX PO) Glucosamine     glucose blood (ACCU-CHEK GUIDE) test strip Check FBS 3 times daily 100 each 12   metoprolol succinate (TOPROL XL) 25 MG 24 hr tablet Take 1 tablet (25 mg total) by mouth daily. 90 tablet 3   rivaroxaban (  XARELTO) 20 MG TABS tablet Take 1 tablet (20 mg total) by mouth daily with supper. 90 tablet 1   TURMERIC PO Take by mouth in the morning and at bedtime.     Ustekinumab (STELARA [place]) Inject into the skin every 8 (eight) weeks.     zolpidem (AMBIEN) 5 MG tablet Take 1 tablet (5 mg total) by mouth at bedtime as needed for sleep. 30 tablet 1   No current facility-administered medications for this visit.    Allergies:   Mercaptopurine, Amitriptyline, Crestor [rosuvastatin  calcium], Metoprolol, Trazodone and nefazodone, Zocor [simvastatin], Morphine and related, and Penicillins   Social History:  The patient  reports that she quit smoking about 21 years ago. Her smoking use included cigarettes. She has never used smokeless tobacco. She reports current alcohol use. She reports that she does not use drugs.   Family History:  The patient's family history includes Cancer in her maternal grandmother; Diabetes in her father and mother; Heart Problems in her father and mother; Peptic Ulcer Disease in an other family member.   ROS:  Please see the history of present illness.   Otherwise, review of systems is positive for none.   All other systems are reviewed and negative.   PHYSICAL EXAM: VS:  BP (!) 146/88    Pulse 93    Ht $R'5\' 5"'Ip$  (1.651 m)    Wt 176 lb (79.8 kg)    LMP 09/23/2012    BMI 29.29 kg/m  , BMI Body mass index is 29.29 kg/m. GEN: Well nourished, well developed, in no acute distress  HEENT: normal  Neck: no JVD, carotid bruits, or masses Cardiac: Irregular; no murmurs, rubs, or gallops,no edema  Respiratory:  clear to auscultation bilaterally, normal work of breathing GI: soft, nontender, nondistended, + BS MS: no deformity or atrophy  Skin: warm and dry Neuro:  Strength and sensation are intact Psych: euthymic mood, full affect  EKG:  EKG is not ordered today. Personal review of the ekg ordered 04/10/21 shows atrial fibrillation, rate 85  Recent Labs: 04/01/2021: B Natriuretic Peptide 230.1; Magnesium 2.0; TSH 1.892 04/10/2021: ALT 11; BUN 12; Creatinine, Ser 0.68; Hemoglobin 14.3; Platelets 195; Potassium 4.6; Sodium 141    Lipid Panel     Component Value Date/Time   CHOL 204 (H) 04/10/2021 0832   TRIG 143 04/10/2021 0832   HDL 70 04/10/2021 0832   CHOLHDL 2.9 04/10/2021 0832   LDLCALC 109 (H) 04/10/2021 0832     Wt Readings from Last 3 Encounters:  05/15/21 176 lb (79.8 kg)  04/18/21 175 lb (79.4 kg)  04/10/21 175 lb 12.8 oz (79.7 kg)       Other studies Reviewed: Additional studies/ records that were reviewed today include: TTE 05/01/21 Review of the above records today demonstrates:   1. Left ventricular ejection fraction, by estimation, is 60 to 65%. The  left ventricle has normal function. The left ventricle has no regional  wall motion abnormalities. There is mild left ventricular hypertrophy of  the inferior segment. Left  ventricular diastolic parameters are indeterminate.   2. Right ventricular systolic function is normal. The right ventricular  size is normal.   3. Left atrial size was mildly dilated.   4. The mitral valve is normal in structure. Mild mitral valve  regurgitation. No evidence of mitral stenosis.   5. The aortic valve is normal in structure. Aortic valve regurgitation is  not visualized. No aortic stenosis is present.   6. The inferior vena cava is  normal in size with greater than 50%  respiratory variability, suggesting right atrial pressure of 3 mmHg.    Myoview 04/18/2021 Low risk stress nuclear study with normal perfusion and normal left ventricular regional and global systolic function.  ASSESSMENT AND PLAN:  1.  Persistent atrial fibrillation: CHA2DS2-VASc of 1.  Currently on Xarelto 20 mg daily.  She is feeling poorly in atrial fibrillation are like to get back into rhythm.  She would prefer to avoid both medications and ablation for now.  Due to that, we Vail Basista plan for cardioversion.  She is having some fatigue and weakness on her metoprolol.  We Shaindy Reader stop metoprolol and start Toprol-XL 25 mg to take at night.  2.  Hypertension: Has been taken off of antihypertensives as she has lost quite a bit of weight.  3.  Hyperlipidemia: Diet and exercise controlled.  Current medicines are reviewed at length with the patient today.   The patient does not have concerns regarding her medicines.  The following changes were made today: Stop metoprolol, start Toprol-XL  Labs/ tests ordered today  include:  No orders of the defined types were placed in this encounter.    Disposition:   FU with Noe Pittsley 3 months  Signed, Annis Lagoy Meredith Leeds, MD  05/15/2021 11:24 AM     [facility] [address] [place] [address] [phone] (office) [phone] (fax)

## 2021-05-15 NOTE — Patient Instructions (Addendum)
Medication Instructions:  Stop metoprolol tartrate  Start metoprolol Succinate 25 mg daily Your physician recommends that you continue on your current medications as directed. Please refer to the Current Medication list given to you today. *If you need a refill on your cardiac medications before your next appointment, please call your pharmacy*  Lab Work: None. If you have labs (blood work) drawn today and your tests are completely normal, you will receive your results only by: [place] (if you have MyChart) OR A paper copy in the mail If you have any lab test that is abnormal or we need to change your treatment, we will call you to review the results.  Testing/Procedures: Your physician has recommended that you have a Cardioversion (DCCV). Electrical Cardioversion uses a jolt of electricity to your heart either through paddles or wired patches attached to your chest. This is a controlled, usually prescheduled, procedure. Defibrillation is done under light anesthesia in the hospital, and you usually go home the day of the procedure. This is done to get your heart back into a normal rhythm. You are not awake for the procedure. Please see the instruction sheet given to you today.   Follow-Up: At [facility], you and your health needs are our priority.  As part of our continuing mission to provide you with exceptional heart care, we have created designated Provider Care Teams.  These Care Teams include your primary Cardiologist (physician) and Advanced Practice Providers (APPs -  Physician Assistants and Nurse Practitioners) who all work together to provide you with the care you need, when you need it.  Your physician wants you to follow-up in: 3 months with  Will Camnitz, MD   We recommend signing up for the patient portal called "MyChart".  Sign up information is provided on this After Visit Summary.  MyChart is used to connect with patients for Virtual Visits (Telemedicine).  Patients  are able to view lab/test results, encounter notes, upcoming appointments, etc.  Non-urgent messages can be sent to your provider as well.   To learn more about what you can do with MyChart, go to [email or web address].    Any Other Special Instructions Will Be Listed Below (If Applicable).

## 2021-05-15 NOTE — Telephone Encounter (Signed)
Sent [employer].  Pt's last 2 heart rates in clinic were 80+.  Advised to take at bedtime as directed.

## 2021-05-15 NOTE — Telephone Encounter (Signed)
Pt c/o medication issue:  1. Name of Medication: metoprolol succinate (TOPROL XL) 25 MG 24 hr tablet  2. How are you currently taking this medication (dosage and times per day)? Take 1 tablet (25 mg total) by mouth daily  3. Are you having a reaction (difficulty breathing--STAT)? no  4. What is your medication issue? Patient calling to say that if her HR drops below 60 is she still supposed to take this medication. Please advise

## 2021-05-21 ENCOUNTER — Other Ambulatory Visit: Payer: Self-pay | Admitting: Family Medicine

## 2021-05-21 DIAGNOSIS — F5101 Primary insomnia: Secondary | ICD-10-CM

## 2021-05-22 ENCOUNTER — Encounter (HOSPITAL_COMMUNITY): Payer: Self-pay | Admitting: Internal Medicine

## 2021-05-22 NOTE — Progress Notes (Signed)
Attempted to obtain medical history via telephone, unable to reach at this time. I left a voicemail to return pre surgical testing department's phone call.  

## 2021-05-27 ENCOUNTER — Other Ambulatory Visit: Payer: Self-pay | Admitting: Cardiology

## 2021-05-28 ENCOUNTER — Telehealth: Payer: Self-pay | Admitting: Cardiology

## 2021-05-28 NOTE — Telephone Encounter (Signed)
Returned pt call. Pt reports fatigue and stuffy nose crept in last Thursday, she took Covid test Saturday which came back positive.   Denies any fever. Aware I will discuss with Dr. Curt Bears and find out how soon we can reschedule her procedure. Patient verbalized understanding and agreeable to plan.

## 2021-05-28 NOTE — Telephone Encounter (Signed)
° °  Pt tested covid+ today and need to r/s her cardioversion scheduled on 05/31/21

## 2021-05-30 ENCOUNTER — Encounter (HOSPITAL_COMMUNITY): Payer: Self-pay | Admitting: Certified Registered Nurse Anesthetist

## 2021-05-31 ENCOUNTER — Ambulatory Visit (HOSPITAL_COMMUNITY): Admission: RE | Admit: 2021-05-31 | Payer: Medicare HMO | Source: Home / Self Care | Admitting: Internal Medicine

## 2021-05-31 DIAGNOSIS — I4891 Unspecified atrial fibrillation: Secondary | ICD-10-CM

## 2021-05-31 SURGERY — CARDIOVERSION
Anesthesia: Monitor Anesthesia Care

## 2021-06-04 ENCOUNTER — Telehealth: Payer: Self-pay

## 2021-06-04 NOTE — Chronic Care Management (AMB) (Signed)
Chronic Care Management Pharmacy Assistant   Name: Brittany [name]  MRN: [id number] DOB: 10-11-1965  Reason for Encounter: Disease State/ Hypertension  Recent office visits:  04-10-2021 Brittany Brome, MD. Cholesterol= 204, LDL= 109. STOP melatonin and phentermine.  Recent consult visits:  05-15-2021 Brittany Haw, MD (Cardiology). START Metoprolol succinate 25 mg daily. STOP cherry tart and metoprolol tartrate 12.5 mg twice daily as needed Do not take if heart rate is 60 or below.  04-10-2021 Brittany Jamaica, PA-C (Cardiology). START xarelto 20 mg daily with supper. Echocardiogram and myocardial perfusion imaging ordered.  03-07-2021 Brittany Ravens, MD (optometry). Unable to view encounter.  Hospital visits:  Medication Reconciliation was completed by comparing discharge summary, patients EMR and Pharmacy list, and upon discussion with patient.  Admitted to the hospital on 04-01-2021 due to Atrial fibrillation Discharge date was 04-01-2021 Discharged from [facility]?Medications Started at [facility] Discharge:?? metoprolol tartrate 12.5 mg twice daily as needed Do not take if heart rate is 60 or below.  Medication Changes at Hospital Discharge: None  Medications Discontinued at Hospital Discharge: None  Medications that remain the same after Hospital Discharge:??  -All other medications will remain the same.    Medications: Outpatient Encounter Medications as of 06/04/2021  Medication Sig   Biotin 1000 MCG tablet Take 1,000 mcg by mouth daily.   blood glucose meter kit and supplies KIT Check sugars daily as needed. Hypoglycemia e16.2   calcium-vitamin D (OSCAL WITH D) 500-200 MG-UNIT per tablet Take 1 tablet by mouth daily.   cholecalciferol (VITAMIN D3) 25 MCG (1000 UNIT) tablet Take 1,000 Units by mouth 3 (three) times a week.   estradiol (ESTRACE) 1 MG tablet TAKE 1 TABLET EVERY DAY   ezetimibe (ZETIA) 10 MG tablet Take 1 tablet (10 mg total) by  mouth daily.   fish oil-omega-3 fatty acids 1000 MG capsule Take 1 g by mouth in the morning and at bedtime.   gabapentin (NEURONTIN) 300 MG capsule Take 1 capsule (300 mg total) by mouth 3 (three) times daily. (Patient taking differently: Take 900 mg by mouth at bedtime.)   Glucosamine-Chondroit-Vit C-Mn (GLUCOSAMINE 1500 COMPLEX PO) Take 2 tablets by mouth at bedtime.   glucose blood (ACCU-CHEK GUIDE) test strip Check FBS 3 times daily   metoprolol succinate (TOPROL XL) 25 MG 24 hr tablet Take 1 tablet (25 mg total) by mouth daily.   rivaroxaban (XARELTO) 20 MG TABS tablet Take 1 tablet (20 mg total) by mouth daily with supper.   TURMERIC PO Take 1 capsule by mouth every evening.   ustekinumab (STELARA) 90 MG/ML SOSY injection Inject 90 mg into the skin every 8 (eight) weeks.   zolpidem (AMBIEN) 5 MG tablet TAKE 1 TABLET BY MOUTH AT BEDTIME AS NEEDED FOR SLEEP. (Patient taking differently: Take 5 mg by mouth at bedtime.)   No facility-administered encounter medications on file as of 06/04/2021.    Recent Office Vitals: BP Readings from Last 3 Encounters:  05/15/21 (!) 146/88  04/10/21 100/60  04/10/21 114/68   Pulse Readings from Last 3 Encounters:  05/15/21 93  04/10/21 85  04/10/21 68    Wt Readings from Last 3 Encounters:  05/15/21 176 lb (79.8 kg)  04/18/21 175 lb (79.4 kg)  04/10/21 175 lb 12.8 oz (79.7 kg)     Kidney Function Lab Results  Component Value Date/Time   CREATININE 0.68 04/10/2021 08:32 AM   CREATININE 0.77 04/01/2021 10:14 AM   GFRNONAA >60 04/01/2021  10:14 AM   GFRAA 114 06/06/2020 09:57 AM    BMP Latest Ref Rng & Units 04/10/2021 04/01/2021 01/02/2021  Glucose 70 - 99 mg/dL 93 104(H) 98  BUN 6 - 24 mg/dL _0 Creatinine 0.57 - 1.00 mg/dL 0.68 0.77 0.74  BUN/Creat Ratio 9 - 23 18 - 26(H)  Sodium 134 - 144 mmol/L 141 138 143  Potassium 3.5 - 5.2 mmol/L 4.6 4.1 4.6  Chloride 96 - 106 mmol/L 104 106 103  CO2 20 - 29 mmol/L _1 Calcium 8.7 -  10.2 mg/dL 9.5 9.4 9.6     Current antihypertensive regimen:  None  How often are you checking your Blood Pressure? Patient states she doesn't check blood pressure at home Caffeine intake: Limited Salt intake:Limited OTC medications including pseudoephedrine or NSAIDs? None  What recent interventions/DTPs have been made by any provider to improve Blood Pressure control since last CPP Visit:  Discussed benefits of exercise and diet to reduce blood pressure  Any recent hospitalizations or ED visits since last visit with CPP? Yes  What diet changes have been made to improve Blood Pressure Control?  Patient states she watches her sodium intake and drinks plenty of water.  What exercise is being done to improve your Blood Pressure Control?  Patient states she tries to use her exercise ball as much as possible and stays active.  Adherence Review: Is the patient currently on ACE/ARB medication? No Does the patient have >5 day gap between last estimated fill dates? CPP to review  NOTES: Scheduled patient a follow up telephone  visit with Brittany [name] CPP. Patient stated she takes metoprolol for Atrial fibrillation.  Care Gaps: Last annual wellness visit? 06-06-2020 Yearly foot exam overdue Tdap overdue Shingrix overdue  Star Rating Drugs: None  [place] Clinical Pharmacist Assistant [phone]

## 2021-06-11 ENCOUNTER — Telehealth: Payer: Self-pay | Admitting: Cardiology

## 2021-06-11 NOTE — Telephone Encounter (Signed)
Patient calling to speak with a nurse about rescheduling her cardioversion.

## 2021-06-12 NOTE — Telephone Encounter (Signed)
Followed up with patient, aware I will be in touch this week to go over instructions for her DCCV next week. Patient verbalized understanding and agreeable to plan.

## 2021-06-12 NOTE — Unmapped (Signed)
Specialty Medication(s): Stelara    Brenda Beard has been dis-enrolled from the [facility] specialty pharmacy services due to a pharmacy change. The patient is now filling at [facility] since they no longer qualify for manufacturer assistance beginning Jan 1st, 2023. [facility] will send the medication directly to patient. .    Additional information provided to the patient: n/a - [name] states she has an appt on 2/27 for a nurse through [facility] to help administer next Stelara injection.    [name]  [facility] Specialty Pharmacist

## 2021-06-14 DIAGNOSIS — N762 Acute vulvitis: Secondary | ICD-10-CM | POA: Diagnosis not present

## 2021-06-14 DIAGNOSIS — Z01419 Encounter for gynecological examination (general) (routine) without abnormal findings: Secondary | ICD-10-CM | POA: Diagnosis not present

## 2021-06-19 ENCOUNTER — Other Ambulatory Visit: Payer: Self-pay

## 2021-06-19 DIAGNOSIS — E1169 Type 2 diabetes mellitus with other specified complication: Secondary | ICD-10-CM

## 2021-06-19 MED ORDER — TRUE METRIX BLOOD GLUCOSE TEST VI STRP: ORAL_STRIP | 12 refills | Status: DC

## 2021-06-19 MED ORDER — TRUEPLUS LANCETS 33G MISC: 1.0000 | Freq: Two times a day (BID) | 5 refills | Status: DC

## 2021-06-19 MED ORDER — TRUE METRIX AIR GLUCOSE METER DEVI: 1.0000 | Freq: Once | 0 refills | Status: AC

## 2021-06-21 ENCOUNTER — Other Ambulatory Visit: Payer: Self-pay

## 2021-06-21 DIAGNOSIS — E1169 Type 2 diabetes mellitus with other specified complication: Secondary | ICD-10-CM

## 2021-06-21 MED ORDER — TRUE METRIX BLOOD GLUCOSE TEST VI STRP: ORAL_STRIP | 12 refills | Status: DC

## 2021-06-21 MED ORDER — TRUEPLUS LANCETS 33G MISC: 1.0000 | Freq: Two times a day (BID) | 5 refills | Status: DC

## 2021-06-21 MED ORDER — BLOOD GLUCOSE MONITOR KIT: PACK | 0 refills | Status: DC

## 2021-06-22 ENCOUNTER — Other Ambulatory Visit: Payer: Self-pay | Admitting: Family Medicine

## 2021-06-22 DIAGNOSIS — F5101 Primary insomnia: Secondary | ICD-10-CM

## 2021-06-26 ENCOUNTER — Other Ambulatory Visit: Payer: Self-pay | Admitting: Cardiology

## 2021-06-28 ENCOUNTER — Ambulatory Visit (INDEPENDENT_AMBULATORY_CARE_PROVIDER_SITE_OTHER): Payer: Medicare HMO | Admitting: Student

## 2021-06-28 ENCOUNTER — Other Ambulatory Visit: Payer: Self-pay

## 2021-06-28 DIAGNOSIS — I4891 Unspecified atrial fibrillation: Secondary | ICD-10-CM

## 2021-06-28 DIAGNOSIS — I10 Essential (primary) hypertension: Secondary | ICD-10-CM | POA: Diagnosis not present

## 2021-06-28 DIAGNOSIS — E785 Hyperlipidemia, unspecified: Secondary | ICD-10-CM

## 2021-06-28 NOTE — Progress Notes (Signed)
Electrophysiology Telephone Visit Note  I connected with  Brittany [name] on 06/28/21 via telephone and verified that I am speaking with the correct person using two identifiers.   I discussed the limitations of evaluation and management by telemedicine. The patient expressed understanding and agreed to proceed.   Date:  06/28/2021   ID:  Brittany [name], DOB [birth year], MRN [id number]  PCP:  [name], MD  Cardiologist:   Primary Electrophysiologist:  Shirley Friar, PA-C    Chief Complaint: Atrial fibrillation   History of Present Illness: Anyi [name] is a 56 y.o. female who is being seen today for the evaluation of bradycardia at the request of [name], [name], MD. Presenting today for electrophysiology evaluation.  She has a history significant for diabetes, hypertension, hyperlipidemia, Crohn's disease.  She presented to cardiology clinic post COVID with palpitations and bradycardia.  She was noted to have heart rates in the 40s associated with fatigue.  She did present to the emergency room 04/01/2021 with chest pain and palpitations.  She was found to be in atrial fibrillation at that time.  She felt that she went into atrial fibrillation November 18, after her mother passed away.  Since being on metoprolol, she had had fatigue.  Today, she denies symptoms of chest pain, orthopnea, PND, edema, dizziness, or syncope. She has continued to have fatigue and SOB with her ongoing AF. She has no questions concerning her upcoming cardioversion. She verbalizes understanding if it is unsuccessful, she may require medications to maintain NSR. She remains uninterested in ablation.   Past Medical History:  Diagnosis Date   BMI 26.0-26.9,adult 07/06/2019   Bradycardia 08/10/2014   Crohn's disease ([place])    DDD (degenerative disc disease), lumbar 07/17/2017   Diabetes mellitus without complication ([place])    Fatigue 08/10/2014   Hyperlipidemia    Hyperlipidemia associated  with type 2 diabetes mellitus ([place]) 07/06/2019   Hypertension    Hypertension, essential    Mixed hyperlipidemia    Palpitations 07/06/2019   Paresthesia 07/06/2019   Past Surgical History:  Procedure Laterality Date   ABDOMINAL HYSTERECTOMY N/A 10/20/2012   Procedure: HYSTERECTOMY ABDOMINAL;  Surgeon: [name], MD;  Location: [facility];  Service: Gynecology;  Laterality: N/A;   ANAL FISSURE REPAIR  2002   APPENDECTOMY     SALPINGOOPHORECTOMY Bilateral 10/20/2012   Procedure: SALPINGO OOPHORECTOMY;  Surgeon: [name], MD;  Location: [facility];  Service: Gynecology;  Laterality: Bilateral;   SMALL INTESTINE SURGERY     reconstruction   TONSILLECTOMY       Current Outpatient Medications  Medication Sig Dispense Refill   Biotin 1000 MCG tablet Take 1,000 mcg by mouth daily.     blood glucose meter kit and supplies KIT Check sugars daily as needed. Hypoglycemia e16.2 1 each 0   calcium-vitamin D (OSCAL WITH D) 500-200 MG-UNIT per tablet Take 1 tablet by mouth daily.     cholecalciferol (VITAMIN D3) 25 MCG (1000 UNIT) tablet Take 1,000 Units by mouth 3 (three) times a week.     estradiol (ESTRACE) 1 MG tablet TAKE 1 TABLET EVERY DAY 90 tablet 0   ezetimibe (ZETIA) 10 MG tablet Take 1 tablet (10 mg total) by mouth daily. 90 tablet 1   fish oil-omega-3 fatty acids 1000 MG capsule Take 1 g by mouth in the morning and at bedtime.     gabapentin (NEURONTIN) 300 MG capsule Take 1 capsule (300 mg total) by mouth 3 (three) times daily. (Patient taking differently:  Take 600 mg by mouth at bedtime.) 270 capsule 1   Glucosamine-Chondroit-Vit C-Mn (GLUCOSAMINE 1500 COMPLEX PO) Take 2 tablets by mouth at bedtime.     glucose blood (TRUE METRIX BLOOD GLUCOSE TEST) test strip Use as instructed 100 each 12   metoprolol succinate (TOPROL XL) 25 MG 24 hr tablet Take 1 tablet (25 mg total) by mouth daily. 90 tablet 3   rivaroxaban (XARELTO) 20 MG TABS tablet Take 1 tablet (20 mg total) by mouth daily with supper.  90 tablet 1   TRUEplus Lancets 33G MISC 1 each by Does not apply route 2 (two) times daily. 100 each 5   TURMERIC PO Take 1 capsule by mouth every evening.     ustekinumab (STELARA) 90 MG/ML SOSY injection Inject 90 mg into the skin every 8 (eight) weeks.     zolpidem (AMBIEN) 5 MG tablet TAKE 1 TABLET BY MOUTH EVERY DAY AT BEDTIME AS NEEDED FOR SLEEP 90 tablet 0   No current facility-administered medications for this visit.    Allergies:   Mercaptopurine, Amitriptyline, Crestor [rosuvastatin calcium], Metoprolol, Trazodone and nefazodone, Zocor [simvastatin], Morphine and related, and Penicillins   Social History:  The patient  reports that she quit smoking about 21 years ago. Her smoking use included cigarettes. She has never used smokeless tobacco. She reports current alcohol use. She reports that she does not use drugs.   Family History:  The patient's family history includes Cancer in her maternal grandmother; Diabetes in her father and mother; Heart Problems in her father and mother; Peptic Ulcer Disease in an other family member.   ROS:  Review of systems complete and found to be negative unless listed in HPI.    PHYSICAL EXAM: Well sounding over phone.  No acute distress or increased work of breathing.  EKG:  EKG is not ordered today. Personal review of the ekg ordered 04/10/21 shows atrial fibrillation, rate 85  Recent Labs: 04/01/2021: B Natriuretic Peptide 230.1; Magnesium 2.0; TSH 1.892 04/10/2021: ALT 11; BUN 12; Creatinine, Ser 0.68; Hemoglobin 14.3; Platelets 195; Potassium 4.6; Sodium 141    Lipid Panel     Component Value Date/Time   CHOL 204 (H) 04/10/2021 0832   TRIG 143 04/10/2021 0832   HDL 70 04/10/2021 0832   CHOLHDL 2.9 04/10/2021 0832   LDLCALC 109 (H) 04/10/2021 0832     Wt Readings from Last 3 Encounters:  05/15/21 176 lb (79.8 kg)  04/18/21 175 lb (79.4 kg)  04/10/21 175 lb 12.8 oz (79.7 kg)      Other studies Reviewed: Additional studies/  records that were reviewed today include: TTE 05/01/21 Review of the above records today demonstrates:   1. Left ventricular ejection fraction, by estimation, is 60 to 65%. The  left ventricle has normal function. The left ventricle has no regional  wall motion abnormalities. There is mild left ventricular hypertrophy of  the inferior segment. Left  ventricular diastolic parameters are indeterminate.   2. Right ventricular systolic function is normal. The right ventricular  size is normal.   3. Left atrial size was mildly dilated.   4. The mitral valve is normal in structure. Mild mitral valve  regurgitation. No evidence of mitral stenosis.   5. The aortic valve is normal in structure. Aortic valve regurgitation is  not visualized. No aortic stenosis is present.   6. The inferior vena cava is normal in size with greater than 50%  respiratory variability, suggesting right atrial pressure of 3 mmHg.  Myoview 04/18/2021 Low risk stress nuclear study with normal perfusion and normal left ventricular regional and global systolic function.  ASSESSMENT AND PLAN:  1.  Persistent atrial fibrillation: CHA2DS2-VASc of 1.   Remains on Xarelto 20 mg daily.   She continues to feel poorly in atrial fibrillation are like to get back into rhythm.   Scheduled for cardioversion next week.  Continue Toprol 25 mg qhs.   2.  HTN Stable on current regimen.   3.  Hyperlipidemia:  Diet and exercise controlled.  Current medicines are reviewed at length with the patient today.    Labs/ tests ordered today include:  No orders of the defined types were placed in this encounter.  Disposition:   FU with Will Camnitz as scheduled post [facility].   Jacalyn Lefevre, PA-C  06/28/2021 9:49 AM     [facility] [address] [place] [address] [phone] (office) [phone] (fax)  10-15 minutes spent in patient encounter.

## 2021-06-28 NOTE — Telephone Encounter (Signed)
Pt aware Oda Kilts, PA will call her today for H&P for upcoming DCCV. Instructions reviewed with patient and sent via mychart. She will have blood work at the hospital prior to DCCV same day. Patient verbalized understanding and agreeable to plan.

## 2021-06-28 NOTE — Telephone Encounter (Signed)
Pt and I spoke week before last about DCCV, scheduled for 2/21. Pt made aware then I would be in touch to go over instructions. Pt agreeable to plan.

## 2021-06-28 NOTE — Telephone Encounter (Signed)
Pt made aware I would discuss video visit for an  H&P w/ the PA today/tomorrow and let her know. Pt agreeable

## 2021-06-28 NOTE — H&P (View-Only) (Signed)
Electrophysiology Telephone Visit Note  I connected with  Brittany [name] on 06/28/21 via telephone and verified that I am speaking with the correct person using two identifiers.   I discussed the limitations of evaluation and management by telemedicine. The patient expressed understanding and agreed to proceed.   Date:  06/28/2021   ID:  Brittany [name], DOB [birth year], MRN [id number]  PCP:  [name], MD  Cardiologist:   Primary Electrophysiologist:  Shirley Friar, PA-C    Chief Complaint: Atrial fibrillation   History of Present Illness: Brittany [name] is a 56 y.o. female who is being seen today for the evaluation of bradycardia at the request of [name], [name], MD. Presenting today for electrophysiology evaluation.  She has a history significant for diabetes, hypertension, hyperlipidemia, Crohn's disease.  She presented to cardiology clinic post COVID with palpitations and bradycardia.  She was noted to have heart rates in the 40s associated with fatigue.  She did present to the emergency room 04/01/2021 with chest pain and palpitations.  She was found to be in atrial fibrillation at that time.  She felt that she went into atrial fibrillation November 18, after her mother passed away.  Since being on metoprolol, she had had fatigue.  Today, she denies symptoms of chest pain, orthopnea, PND, edema, dizziness, or syncope. She has continued to have fatigue and SOB with her ongoing AF. She has no questions concerning her upcoming cardioversion. She verbalizes understanding if it is unsuccessful, she may require medications to maintain NSR. She remains uninterested in ablation.   Past Medical History:  Diagnosis Date   BMI 26.0-26.9,adult 07/06/2019   Bradycardia 08/10/2014   Crohn's disease ([place])    DDD (degenerative disc disease), lumbar 07/17/2017   Diabetes mellitus without complication ([place])    Fatigue 08/10/2014   Hyperlipidemia    Hyperlipidemia associated  with type 2 diabetes mellitus ([place]) 07/06/2019   Hypertension    Hypertension, essential    Mixed hyperlipidemia    Palpitations 07/06/2019   Paresthesia 07/06/2019   Past Surgical History:  Procedure Laterality Date   ABDOMINAL HYSTERECTOMY N/A 10/20/2012   Procedure: HYSTERECTOMY ABDOMINAL;  Surgeon: [name], MD;  Location: [facility];  Service: Gynecology;  Laterality: N/A;   ANAL FISSURE REPAIR  2002   APPENDECTOMY     SALPINGOOPHORECTOMY Bilateral 10/20/2012   Procedure: SALPINGO OOPHORECTOMY;  Surgeon: [name], MD;  Location: [facility];  Service: Gynecology;  Laterality: Bilateral;   SMALL INTESTINE SURGERY     reconstruction   TONSILLECTOMY       Current Outpatient Medications  Medication Sig Dispense Refill   Biotin 1000 MCG tablet Take 1,000 mcg by mouth daily.     blood glucose meter kit and supplies KIT Check sugars daily as needed. Hypoglycemia e16.2 1 each 0   calcium-vitamin D (OSCAL WITH D) 500-200 MG-UNIT per tablet Take 1 tablet by mouth daily.     cholecalciferol (VITAMIN D3) 25 MCG (1000 UNIT) tablet Take 1,000 Units by mouth 3 (three) times a week.     estradiol (ESTRACE) 1 MG tablet TAKE 1 TABLET EVERY DAY 90 tablet 0   ezetimibe (ZETIA) 10 MG tablet Take 1 tablet (10 mg total) by mouth daily. 90 tablet 1   fish oil-omega-3 fatty acids 1000 MG capsule Take 1 g by mouth in the morning and at bedtime.     gabapentin (NEURONTIN) 300 MG capsule Take 1 capsule (300 mg total) by mouth 3 (three) times daily. (Patient taking differently:  Take 600 mg by mouth at bedtime.) 270 capsule 1   Glucosamine-Chondroit-Vit C-Mn (GLUCOSAMINE 1500 COMPLEX PO) Take 2 tablets by mouth at bedtime.     glucose blood (TRUE METRIX BLOOD GLUCOSE TEST) test strip Use as instructed 100 each 12   metoprolol succinate (TOPROL XL) 25 MG 24 hr tablet Take 1 tablet (25 mg total) by mouth daily. 90 tablet 3   rivaroxaban (XARELTO) 20 MG TABS tablet Take 1 tablet (20 mg total) by mouth daily with supper.  90 tablet 1   TRUEplus Lancets 33G MISC 1 each by Does not apply route 2 (two) times daily. 100 each 5   TURMERIC PO Take 1 capsule by mouth every evening.     ustekinumab (STELARA) 90 MG/ML SOSY injection Inject 90 mg into the skin every 8 (eight) weeks.     zolpidem (AMBIEN) 5 MG tablet TAKE 1 TABLET BY MOUTH EVERY DAY AT BEDTIME AS NEEDED FOR SLEEP 90 tablet 0   No current facility-administered medications for this visit.    Allergies:   Mercaptopurine, Amitriptyline, Crestor [rosuvastatin calcium], Metoprolol, Trazodone and nefazodone, Zocor [simvastatin], Morphine and related, and Penicillins   Social History:  The patient  reports that she quit smoking about 21 years ago. Her smoking use included cigarettes. She has never used smokeless tobacco. She reports current alcohol use. She reports that she does not use drugs.   Family History:  The patient's family history includes Cancer in her maternal grandmother; Diabetes in her father and mother; Heart Problems in her father and mother; Peptic Ulcer Disease in an other family member.   ROS:  Review of systems complete and found to be negative unless listed in HPI.    PHYSICAL EXAM: Well sounding over phone.  No acute distress or increased work of breathing.  EKG:  EKG is not ordered today. Personal review of the ekg ordered 04/10/21 shows atrial fibrillation, rate 85  Recent Labs: 04/01/2021: B Natriuretic Peptide 230.1; Magnesium 2.0; TSH 1.892 04/10/2021: ALT 11; BUN 12; Creatinine, Ser 0.68; Hemoglobin 14.3; Platelets 195; Potassium 4.6; Sodium 141    Lipid Panel     Component Value Date/Time   CHOL 204 (H) 04/10/2021 0832   TRIG 143 04/10/2021 0832   HDL 70 04/10/2021 0832   CHOLHDL 2.9 04/10/2021 0832   LDLCALC 109 (H) 04/10/2021 0832     Wt Readings from Last 3 Encounters:  05/15/21 176 lb (79.8 kg)  04/18/21 175 lb (79.4 kg)  04/10/21 175 lb 12.8 oz (79.7 kg)      Other studies Reviewed: Additional studies/  records that were reviewed today include: TTE 05/01/21 Review of the above records today demonstrates:   1. Left ventricular ejection fraction, by estimation, is 60 to 65%. The  left ventricle has normal function. The left ventricle has no regional  wall motion abnormalities. There is mild left ventricular hypertrophy of  the inferior segment. Left  ventricular diastolic parameters are indeterminate.   2. Right ventricular systolic function is normal. The right ventricular  size is normal.   3. Left atrial size was mildly dilated.   4. The mitral valve is normal in structure. Mild mitral valve  regurgitation. No evidence of mitral stenosis.   5. The aortic valve is normal in structure. Aortic valve regurgitation is  not visualized. No aortic stenosis is present.   6. The inferior vena cava is normal in size with greater than 50%  respiratory variability, suggesting right atrial pressure of 3 mmHg.  Myoview 04/18/2021 Low risk stress nuclear study with normal perfusion and normal left ventricular regional and global systolic function.  ASSESSMENT AND PLAN:  1.  Persistent atrial fibrillation: CHA2DS2-VASc of 1.   Remains on Xarelto 20 mg daily.   She continues to feel poorly in atrial fibrillation are like to get back into rhythm.   Scheduled for cardioversion next week.  Continue Toprol 25 mg qhs.   2.  HTN Stable on current regimen.   3.  Hyperlipidemia:  Diet and exercise controlled.  Current medicines are reviewed at length with the patient today.    Labs/ tests ordered today include:  No orders of the defined types were placed in this encounter.  Disposition:   FU with Will Camnitz as scheduled post [facility].   Jacalyn Lefevre, PA-C  06/28/2021 9:49 AM     [facility] [address] [place] [address] [phone] (office) [phone] (fax)  10-15 minutes spent in patient encounter.

## 2021-07-03 ENCOUNTER — Ambulatory Visit (HOSPITAL_COMMUNITY): Payer: Medicare HMO | Admitting: Certified Registered Nurse Anesthetist

## 2021-07-03 ENCOUNTER — Other Ambulatory Visit: Payer: Self-pay

## 2021-07-03 ENCOUNTER — Encounter (HOSPITAL_COMMUNITY)
Admission: RE | Disposition: A | Discharge status: Home / Self Care | Payer: Self-pay | Source: Home / Self Care | Attending: Cardiology

## 2021-07-03 ENCOUNTER — Ambulatory Visit (HOSPITAL_COMMUNITY)
Admission: RE | Admit: 2021-07-03 | Discharge: 2021-07-03 | Disposition: A | Discharge status: Home / Self Care | Payer: Medicare HMO | Attending: Cardiology | Admitting: Cardiology

## 2021-07-03 ENCOUNTER — Ambulatory Visit (HOSPITAL_BASED_OUTPATIENT_CLINIC_OR_DEPARTMENT_OTHER): Payer: Medicare HMO | Admitting: Certified Registered Nurse Anesthetist

## 2021-07-03 ENCOUNTER — Encounter (HOSPITAL_COMMUNITY): Payer: Self-pay | Admitting: Cardiology

## 2021-07-03 DIAGNOSIS — I10 Essential (primary) hypertension: Secondary | ICD-10-CM | POA: Diagnosis not present

## 2021-07-03 DIAGNOSIS — K5090 Crohn's disease, unspecified, without complications: Secondary | ICD-10-CM | POA: Insufficient documentation

## 2021-07-03 DIAGNOSIS — I4891 Unspecified atrial fibrillation: Secondary | ICD-10-CM

## 2021-07-03 DIAGNOSIS — Z7901 Long term (current) use of anticoagulants: Secondary | ICD-10-CM | POA: Diagnosis not present

## 2021-07-03 DIAGNOSIS — Z79899 Other long term (current) drug therapy: Secondary | ICD-10-CM | POA: Insufficient documentation

## 2021-07-03 DIAGNOSIS — Z87891 Personal history of nicotine dependence: Secondary | ICD-10-CM | POA: Insufficient documentation

## 2021-07-03 DIAGNOSIS — E119 Type 2 diabetes mellitus without complications: Secondary | ICD-10-CM | POA: Diagnosis not present

## 2021-07-03 DIAGNOSIS — I4819 Other persistent atrial fibrillation: Secondary | ICD-10-CM | POA: Diagnosis not present

## 2021-07-03 DIAGNOSIS — I4811 Longstanding persistent atrial fibrillation: Secondary | ICD-10-CM

## 2021-07-03 DIAGNOSIS — Z8616 Personal history of COVID-19: Secondary | ICD-10-CM | POA: Diagnosis not present

## 2021-07-03 DIAGNOSIS — E782 Mixed hyperlipidemia: Secondary | ICD-10-CM | POA: Diagnosis not present

## 2021-07-03 HISTORY — PX: CARDIOVERSION: SHX1299

## 2021-07-03 SURGERY — CARDIOVERSION
Anesthesia: General

## 2021-07-03 MED ORDER — LIDOCAINE 2% (20 MG/ML) 5 ML SYRINGE
INTRAMUSCULAR | Status: DC | PRN
  Administered 2021-07-03: 60 mg via INTRAVENOUS

## 2021-07-03 MED ORDER — SODIUM CHLORIDE 0.9 % IV SOLN: INTRAVENOUS | Status: DC

## 2021-07-03 MED ORDER — METOPROLOL SUCCINATE ER 25 MG PO TB24: 12.5000 mg | ORAL_TABLET | Freq: Every day | ORAL | 3 refills | Status: DC

## 2021-07-03 MED ORDER — PROPOFOL 10 MG/ML IV BOLUS
INTRAVENOUS | Status: DC | PRN
  Administered 2021-07-03: 60 mg via INTRAVENOUS

## 2021-07-03 NOTE — CV Procedure (Addendum)
Procedure:   DCCV  Indication:  Symptomatic atrial fibrillation  Procedure Note:  The patient signed informed consent.  They have had had therapeutic anticoagulation with Xarelto greater than 3 weeks.  Anesthesia was administered by Dr. Christella Hartigan and Garrison Columbus, CRNA.  Adequate airway was maintained throughout and vital followed per protocol.  They were cardioverted x 1 with 200J of biphasic synchronized energy.  They converted to NSR.  Will decrease toprol XL dose to 12.5 mg daily as HR in 40s following cardioversion.  There were no apparent complications.  The patient had normal neuro status and respiratory status post procedure with vitals stable as recorded elsewhere.    Follow up:  They will continue on current medical therapy and follow up with cardiology as scheduled.  Oswaldo Milian, MD 07/03/2021 9:21 AM

## 2021-07-03 NOTE — Anesthesia Preprocedure Evaluation (Signed)
Anesthesia Evaluation  Patient identified by MRN, date of birth, ID band Patient awake    Reviewed: Allergy & Precautions, H&P , NPO status , Patient's Chart, lab work & pertinent test results  History of Anesthesia Complications Negative for: history of anesthetic complications  Airway Mallampati: II  TM Distance: >3 FB Neck ROM: full    Dental   Pulmonary neg pulmonary ROS, former smoker,    Pulmonary exam normal        Cardiovascular hypertension, + dysrhythmias Atrial Fibrillation  Rhythm:irregular   Echo 05/01/21: 1. Left ventricular ejection fraction, by estimation, is 60 to 65%. The left ventricle has normal function. The left ventricle has no regional wall motion abnormalities. There is mild left ventricular hypertrophy of the inferior segment. Left ventricular diastolic parameters are indeterminate. 2. Right ventricular systolic function is normal. The right ventricular size is normal. 3. Left atrial size was mildly dilated. 4. The mitral valve is normal in structure. Mild mitral valve regurgitation. No evidence of mitral stenosis. 5. The aortic valve is normal in structure. Aortic valve regurgitation is not visualized. No aortic stenosis is present. 6. The inferior vena cava is normal in size with greater than 50% respiratory variability, suggesting right atrial pressure of 3 mmHg.   Neuro/Psych negative neurological ROS  negative psych ROS   GI/Hepatic Neg liver ROS, Crohn's disease   Endo/Other  diabetes  Renal/GU negative Renal ROS  negative genitourinary   Musculoskeletal   Abdominal   Peds  Hematology   Anesthesia Other Findings   Reproductive/Obstetrics                            Anesthesia Physical Anesthesia Plan  ASA: 3  Anesthesia Plan: General   Post-op Pain Management:    Induction:   PONV Risk Score and Plan:   Airway Management Planned:   Additional  Equipment:   Intra-op Plan:   Post-operative Plan:   Informed Consent: I have reviewed the patients History and Physical, chart, labs and discussed the procedure including the risks, benefits and alternatives for the proposed anesthesia with the patient or authorized representative who has indicated his/her understanding and acceptance.       Plan Discussed with: Anesthesiologist  Anesthesia Plan Comments:         Anesthesia Quick Evaluation

## 2021-07-03 NOTE — Anesthesia Procedure Notes (Signed)
Procedure Name: General with mask airway Date/Time: 07/03/2021 8:57 AM Performed by: Harden Mo, CRNA Pre-anesthesia Checklist: Patient identified, Emergency Drugs available, Suction available and Patient being monitored Patient Re-evaluated:Patient Re-evaluated prior to induction Oxygen Delivery Method: Ambu bag Preoxygenation: Pre-oxygenation with 100% oxygen Induction Type: IV induction Placement Confirmation: positive ETCO2 and breath sounds checked- equal and bilateral Dental Injury: Teeth and Oropharynx as per pre-operative assessment

## 2021-07-03 NOTE — Discharge Instructions (Signed)

## 2021-07-03 NOTE — Anesthesia Postprocedure Evaluation (Signed)
Anesthesia Post Note  Patient: Brittany [name]  Procedure(s) Performed: CARDIOVERSION     Patient location during evaluation: Endoscopy Anesthesia Type: MAC Level of consciousness: awake and alert Pain management: pain level controlled Vital Signs Assessment: post-procedure vital signs reviewed and stable Respiratory status: spontaneous breathing, nonlabored ventilation and respiratory function stable Cardiovascular status: blood pressure returned to baseline and stable Postop Assessment: no apparent nausea or vomiting Anesthetic complications: no   No notable events documented.  Last Vitals:  Vitals:   07/03/21 0925 07/03/21 0935  BP: 112/66 124/63  Pulse: (!) 50 (!) 52  Resp: 14 15  Temp: (!) 36.3 C   SpO2: 96% 97%    Last Pain:  Vitals:   07/03/21 0935  TempSrc:   PainSc: 0-No pain                 Lidia Collum

## 2021-07-03 NOTE — Transfer of Care (Signed)
Immediate Anesthesia Transfer of Care Note  Patient: Brittany [name]  Procedure(s) Performed: CARDIOVERSION  Patient Location: Endoscopy Unit  Anesthesia Type:General  Level of Consciousness: awake and alert   Airway & Oxygen Therapy: Patient Spontanous Breathing  Post-op Assessment: Report given to RN and Post -op Vital signs reviewed and stable  Post vital signs: Reviewed and stable  Last Vitals:  Vitals Value Taken Time  BP 116/62   Temp    Pulse 58   Resp 12   SpO2 97     Last Pain:  Vitals:   07/03/21 0807  TempSrc: Temporal  PainSc: 0-No pain         Complications: No notable events documented.

## 2021-07-03 NOTE — Interval H&P Note (Signed)
History and Physical Interval Note:  07/03/2021 9:00 AM  Brittany [name]  has presented today for surgery, with the diagnosis of AFIB.  The various methods of treatment have been discussed with the patient and family. After consideration of risks, benefits and other options for treatment, the patient has consented to  Procedure(s): CARDIOVERSION (N/A) as a surgical intervention.  The patient's history has been reviewed, patient examined, no change in status, stable for surgery.  I have reviewed the patient's chart and labs.  Questions were answered to the patient's satisfaction.     Donato Heinz

## 2021-07-04 ENCOUNTER — Encounter (HOSPITAL_COMMUNITY): Payer: Self-pay | Admitting: Cardiology

## 2021-07-04 LAB — POCT I-STAT, CHEM 8
BUN: 17 mg/dL (ref 6–20)
Calcium, Ion: 1.21 mmol/L (ref 1.15–1.40)
Chloride: 102 mmol/L (ref 98–111)
Creatinine, Ser: 0.8 mg/dL (ref 0.44–1.00)
Glucose, Bld: 98 mg/dL (ref 70–99)
HCT: 47 % — ABNORMAL HIGH (ref 36.0–46.0)
Hemoglobin: 16 g/dL — ABNORMAL HIGH (ref 12.0–15.0)
Potassium: 3.6 mmol/L (ref 3.5–5.1)
Sodium: 139 mmol/L (ref 135–145)
TCO2: 31 mmol/L (ref 22–32)

## 2021-07-06 ENCOUNTER — Ambulatory Visit (INDEPENDENT_AMBULATORY_CARE_PROVIDER_SITE_OTHER): Payer: Medicare HMO

## 2021-07-06 ENCOUNTER — Other Ambulatory Visit: Payer: Self-pay

## 2021-07-06 DIAGNOSIS — E782 Mixed hyperlipidemia: Secondary | ICD-10-CM

## 2021-07-06 DIAGNOSIS — E11649 Type 2 diabetes mellitus with hypoglycemia without coma: Secondary | ICD-10-CM

## 2021-07-06 DIAGNOSIS — E785 Hyperlipidemia, unspecified: Secondary | ICD-10-CM

## 2021-07-06 DIAGNOSIS — E1169 Type 2 diabetes mellitus with other specified complication: Secondary | ICD-10-CM

## 2021-07-06 DIAGNOSIS — I4891 Unspecified atrial fibrillation: Secondary | ICD-10-CM

## 2021-07-06 NOTE — Patient Instructions (Signed)
Visit Information   Goals Addressed   None    Patient Care Plan: CCM Pharmacy Care Plan     Problem Identified: Disease State Management   Priority: High  Onset Date: 07/06/2021     Long-Range Goal: Brittany [name], AFib   Start Date: 07/06/2021  Expected End Date: 07/06/2022  This Visit's Progress: On track  Priority: High  Note:   Current Barriers:  Does not contact provider office for questions/concerns  Pharmacist Clinical Goal(s):  Patient will contact provider office for questions/concerns as evidenced notation of same in electronic health record through collaboration with PharmD and provider.   Interventions: 1:1 collaboration with [name], Brittany Maxwell, MD regarding development and update of comprehensive plan of care as evidenced by provider attestation and co-signature Inter-disciplinary care team collaboration (see longitudinal plan of care) Comprehensive medication review performed; medication list updated in electronic medical record   Hyperlipidemia: (LDL goal < 70) The 10-year ASCVD risk score (Arnett DK, et al., 2019) is: 4.9%   Values used to calculate the score:     Age: 56 years     Sex: Female     Is Non-Hispanic African American: No     Diabetic: Yes     Tobacco smoker: No     Systolic Blood Pressure: 802 mmHg     Is BP treated: Yes     HDL Cholesterol: 70 mg/dL     Total Cholesterol: 204 mg/dL Lab Results  Component Value Date   CHOL 204 (H) 04/10/2021   CHOL 232 (H) 01/02/2021   CHOL 234 (H) 08/25/2020   Lab Results  Component Value Date   HDL 70 04/10/2021   HDL 81 01/02/2021   HDL 95 08/25/2020   Lab Results  Component Value Date   LDLCALC 109 (H) 04/10/2021   Brittany [name] 129 (H) 01/02/2021   Brittany [name] 124 (H) 08/25/2020   Lab Results  Component Value Date   TRIG 143 04/10/2021   TRIG 126 01/02/2021   TRIG 90 08/25/2020   Lab Results  Component Value Date   CHOLHDL 2.9 04/10/2021   CHOLHDL 2.9 01/02/2021   CHOLHDL 2.5 08/25/2020  No results  found for: LDLDIRECT -Uncontrolled -Current treatment: Ezetimibe 10mg  QD Appropriate, Query effective, Safe, Accessible -Medications previously tried: Statins  -Current dietary patterns: "Tries to eat healthy" -Current exercise habits: N/A -Educated on Cholesterol goals;  -Recommended to continue current medication  Pre-Diabetes (A1c goal <6.5%) Lab Results  Component Value Date   HGBA1C 5.6 04/10/2021   HGBA1C 5.6 01/02/2021   HGBA1C 5.6 08/25/2020   Lab Results  Component Value Date   MICROALBUR 30 08/25/2020   Brittany [name] 109 (H) 04/10/2021   CREATININE 0.80 07/03/2021   Lab Results  Component Value Date   NA 139 07/03/2021   K 3.6 07/03/2021   CREATININE 0.80 07/03/2021   EGFR 102 04/10/2021   GFRNONAA >60 04/01/2021   GLUCOSE 98 07/03/2021   Lab Results  Component Value Date   WBC 5.7 04/10/2021   HGB 16.0 (H) 07/03/2021   HCT 47.0 (H) 07/03/2021   MCV 89 04/10/2021   PLT 195 04/10/2021  -Controlled -Current medications: N/A -Medications previously tried: N/A  -Current home glucose readings fasting glucose: N/A post prandial glucose: N/A -Denies hypoglycemic/hyperglycemic symptoms -Current exercise: N/A -Educated on A1c and blood sugar goals; -Counseled to check feet daily and get yearly eye exams -Recommended to continue current medication  Atrial Fibrillation (Goal: prevent stroke and major bleeding) -Controlled -CHADSVASC: 1 -Current treatment: Rate control:  Metoprolol Succ 25mg  QD  Appropriate, Effective, Safe, Accessible Anticoagulation:  Rivaroxaban $RemoveBefo'20mg'rqTZScxuqWi$  QD Appropriate, Effective, Safe, Accessible -Medications previously tried: Metoprolol Tartrate (Lethargy) -Home BP and HR readings: Didn't have list  -Counseled on increased risk of stroke due to Afib and benefits of anticoagulation for stroke prevention; -Recommended to continue current medication  Patient Goals/Self-Care Activities Patient will:  - take medications as prescribed as evidenced  by patient report and record review  Follow Up Plan: The patient has been provided with contact information for the care management team and has been advised to call with any health related questions or concerns.   CPP F/U August 2023  Brittany [name], Brittany [name].D. - [phone]       [name] was given information about Chronic Care Management services today including:  CCM service includes personalized support from designated clinical staff supervised by her physician, including individualized plan of care and coordination with other care providers 24/7 contact phone numbers for assistance for urgent and routine care needs. Standard insurance, coinsurance, copays and deductibles apply for chronic care management only during months in which we provide at least 20 minutes of these services. Most insurances cover these services at 100%, however patients may be responsible for any copay, coinsurance and/or deductible if applicable. This service may help you avoid the need for more expensive face-to-face services. Only one practitioner may furnish and bill the service in a calendar month. The patient may stop CCM services at any time (effective at the end of the month) by phone call to the office staff.  Patient agreed to services and verbal consent obtained.   The patient verbalized understanding of instructions, educational materials, and care plan provided today and declined offer to receive copy of patient instructions, educational materials, and care plan.  The pharmacy team will reach out to the patient again over the next 60 days.   Brittany [name], Brittany [name]

## 2021-07-06 NOTE — Progress Notes (Signed)
Chronic Care Management Pharmacy Note  07/06/2021 Name:  Normalee Sistare MRN:  [id number] DOB:  1966/03/18  Summary: -Pleasant 56 year old female presents for f/u CCM visit. Patient is feeling stressed, she lost hre mother in November 2022 and this, she feels, caused her to go into Afib. Her husband of 37 years passed in 2018 to Pancreatic Cancer. She has 1 daughter and loves the outdoors, particularly the beach.   Recommendations/Changes made from today's visit: -Stop Tumeric, could cause rhythm issues   Subjective: Tiffini Blacksher is an 56 y.o. year old female who is a primary patient of [name], Kirsten, MD.  The CCM team was consulted for assistance with disease management and care coordination needs.    Engaged with patient by telephone for follow up visit in response to provider referral for pharmacy case management and/or care coordination services.   Consent to Services:  The patient was given the following information about Chronic Care Management services today, agreed to services, and gave verbal consent: 1. CCM service includes personalized support from designated clinical staff supervised by the primary care provider, including individualized plan of care and coordination with other care providers 2. 24/7 contact phone numbers for assistance for urgent and routine care needs. 3. Service will only be billed when office clinical staff spend 20 minutes or more in a month to coordinate care. 4. Only one practitioner may furnish and bill the service in a calendar month. 5.The patient may stop CCM services at any time (effective at the end of the month) by phone call to the office staff. 6. The patient will be responsible for cost sharing (co-pay) of up to 20% of the service fee (after annual deductible is met). Patient agreed to services and consent obtained.  Patient Care Team: [name], MD as PCP - General (Family Medicine) [name], [facility] (Pharmacist)  Recent  office visits:  04-10-2021 [name], MD. Cholesterol= 204, LDL= 109. STOP melatonin and phentermine.   Recent consult visits:  05-15-2021 [name], MD (Cardiology). START Metoprolol succinate 25 mg daily. STOP cherry tart and metoprolol tartrate 12.5 mg twice daily as needed Do not take if heart rate is 60 or below.   04-10-2021 [name], PA-C (Cardiology). START xarelto 20 mg daily with supper. Echocardiogram and myocardial perfusion imaging ordered.   03-07-2021 [name], MD (optometry). Unable to view encounter.   Hospital visits:  Medication Reconciliation was completed by comparing discharge summary, patients EMR and Pharmacy list, and upon discussion with patient.   Admitted to the hospital on 04-01-2021 due to Atrial fibrillation Discharge date was 04-01-2021 Discharged from [place]?Medications Started at [facility] Discharge:?? metoprolol tartrate 12.5 mg twice daily as needed Do not take if heart rate is 60 or below.   Medication Changes at Hospital Discharge: None   Medications Discontinued at Hospital Discharge: None   Medications that remain the same after Hospital Discharge:??  -All other medications will remain the same.     Objective:  Lab Results  Component Value Date   CREATININE 0.80 07/03/2021   BUN 17 07/03/2021   EGFR 102 04/10/2021   GFRNONAA >60 04/01/2021   GFRAA 114 06/06/2020   NA 139 07/03/2021   K 3.6 07/03/2021   CALCIUM 9.5 04/10/2021   CO2 26 04/10/2021   GLUCOSE 98 07/03/2021    Lab Results  Component Value Date/Time   HGBA1C 5.6 04/10/2021 08:32 AM   HGBA1C 5.6 01/02/2021 08:56  AM   MICROALBUR 30 08/25/2020 09:35 AM   MICROALBUR 80 07/06/2019 09:07 AM    Last diabetic Eye exam:  Lab Results  Component Value Date/Time   HMDIABEYEEXA No Retinopathy 06/17/2018 12:00 AM    Last diabetic Foot exam: No results found for: HMDIABFOOTEX   Lab Results  Component Value Date   CHOL 204  (H) 04/10/2021   HDL 70 04/10/2021   LDLCALC 109 (H) 04/10/2021   TRIG 143 04/10/2021   CHOLHDL 2.9 04/10/2021    Hepatic Function Latest Ref Rng & Units 04/10/2021 01/02/2021 08/25/2020  Total Protein 6.0 - 8.5 g/dL 6.4 6.7 6.9  Albumin 3.8 - 4.9 g/dL 4.1 4.6 4.4  AST 0 - 40 IU/L $Remov'15 14 14  'pVTTlw$ ALT 0 - 32 IU/L $Remov'11 11 9  'WuaVTA$ Alk Phosphatase 44 - 121 IU/L 58 64 60  Total Bilirubin 0.0 - 1.2 mg/dL 0.5 0.5 0.6    Lab Results  Component Value Date/Time   TSH 1.892 04/01/2021 10:14 AM   TSH 1.330 08/25/2020 10:05 AM   TSH 1.990 06/06/2020 09:57 AM    CBC Latest Ref Rng & Units 07/03/2021 04/10/2021 04/01/2021  WBC 3.4 - 10.8 x10E3/uL - 5.7 5.4  Hemoglobin 12.0 - 15.0 g/dL 16.0(H) 14.3 15.7(H)  Hematocrit 36.0 - 46.0 % 47.0(H) 42.8 48.9(H)  Platelets 150 - 450 x10E3/uL - 195 212    Lab Results  Component Value Date/Time   VD25OH 43.9 08/25/2020 10:05 AM    Clinical ASCVD: No  The 10-year ASCVD risk score (Arnett DK, et al., 2019) is: 4.9%   Values used to calculate the score:     Age: 79 years     Sex: Female     Is Non-Hispanic African American: No     Diabetic: Yes     Tobacco smoker: No     Systolic Blood Pressure: 277 mmHg     Is BP treated: Yes     HDL Cholesterol: 70 mg/dL     Total Cholesterol: 204 mg/dL    Depression screen [facility] 2/9 08/25/2020 06/10/2020 02/17/2020  Decreased Interest 0 0 0  Down, Depressed, Hopeless 0 0 0  PHQ - 2 Score 0 0 0  Altered sleeping - - -  Tired, decreased energy - - -  Change in appetite - - -  Feeling bad or failure about yourself  - - -  Trouble concentrating - - -  Moving slowly or fidgety/restless - - -  Suicidal thoughts - - -  PHQ-9 Score - - -  Difficult doing work/chores - - -     Other: (CHADS2VASc if Afib, MMRC or CAT for COPD, ACT, DEXA)  Social History   Tobacco Use  Smoking Status Former   Types: Cigarettes   Quit date: 2002   Years since quitting: 21.1  Smokeless Tobacco Never   BP Readings from Last 3 Encounters:   07/03/21 124/63  05/15/21 (!) 146/88  04/10/21 100/60   Pulse Readings from Last 3 Encounters:  07/03/21 (!) 52  05/15/21 93  04/10/21 85   Wt Readings from Last 3 Encounters:  07/03/21 180 lb (81.6 kg)  05/15/21 176 lb (79.8 kg)  04/18/21 175 lb (79.4 kg)   BMI Readings from Last 3 Encounters:  07/03/21 29.05 kg/m  05/15/21 29.29 kg/m  04/18/21 29.12 kg/m    Assessment/Interventions: Review of patient past medical history, allergies, medications, health status, including review of consultants reports, laboratory and other test data, was performed as part of comprehensive evaluation and provision  of chronic care management services.   SDOH:  (Social Determinants of Health) assessments and interventions performed: Yes SDOH Interventions    Flowsheet Row Most Recent Value  SDOH Interventions   Financial Strain Interventions Intervention Not Indicated  Transportation Interventions Intervention Not Indicated      SDOH Screenings   Alcohol Screen: Low Risk    Last Alcohol Screening Score (AUDIT): 1  Depression (PHQ2-9): Low Risk    PHQ-2 Score: 0  Financial Resource Strain: Low Risk    Difficulty of Paying Living Expenses: Not hard at all  Food Insecurity: Not on file  Housing: Not on file  Physical Activity: Not on file  Social Connections: Not on file  Stress: Not on file  Tobacco Use: Medium Risk   Smoking Tobacco Use: Former   Smokeless Tobacco Use: Never   Passive Exposure: Not on file  Transportation Needs: No Transportation Needs   Lack of Transportation (Medical): No   Lack of Transportation (Non-Medical): No    CCM Care Plan  Allergies  Allergen Reactions   Mercaptopurine     Used to treat pt. Crohn's Disease.  Caused Pancreatis 1992.   Amitriptyline     No emotions    Crestor [Rosuvastatin Calcium]     Muscle pain   Metoprolol Other (See Comments)    Extreme fatigue    Trazodone And Nefazodone     Did not work for pt    Zocor  [Simvastatin]     Cramps in legs   Morphine And Related Itching   Penicillins Rash    Medications Reviewed Today     Reviewed by [name], [facility] (Pharmacist) on 07/06/21 at 1439  Med List Status: <None>   Medication Order Taking? Sig Documenting Provider Last Dose Status Informant  Biotin 1000 MCG tablet [id number]  Take 1,000 mcg by mouth daily. [provider]  Active Self  blood glucose meter kit and supplies KIT [id number]  Check sugars daily as needed. Hypoglycemia e16.2 [name], [name], MD  Active Self  calcium-vitamin D (OSCAL WITH D) 500-200 MG-UNIT per tablet [id number]  Take 1 tablet by mouth daily. [provider]  Active Self  cholecalciferol (VITAMIN D3) 25 MCG (1000 UNIT) tablet [id number]  Take 1,000 Units by mouth 3 (three) times a week. [provider]  Active Self  estradiol (ESTRACE) 1 MG tablet [id number]  TAKE 1 TABLET EVERY DAY Marge Duncans, PA-C  Active Self  ezetimibe (ZETIA) 10 MG tablet [id number]  Take 1 tablet (10 mg total) by mouth daily. Marge Duncans, PA-C  Active Self  fish oil-omega-3 fatty acids 1000 MG capsule [id number]  Take 1 g by mouth in the morning and at bedtime. [provider]  Active Self  gabapentin (NEURONTIN) 300 MG capsule [id number]  Take 1 capsule (300 mg total) by mouth 3 (three) times daily.  Patient taking differently: Take 600 mg by mouth at bedtime.   [name], Kirsten, MD  Active Self  Glucosamine-Chondroit-Vit C-Mn (GLUCOSAMINE 1500 COMPLEX PO) [id number]  Take 2 tablets by mouth at bedtime. [provider]  Active Self  glucose blood (TRUE METRIX BLOOD GLUCOSE TEST) test strip [id number]  Use as instructed [name], Kirsten, MD  Active Self  metoprolol succinate (TOPROL XL) 25 MG 24 hr tablet [id number]  Take 0.5 tablets (12.5 mg total) by mouth daily. Donato Heinz, MD  Active   rivaroxaban (XARELTO) 20 MG TABS tablet [id number]  Take 1 tablet (20 mg total) by mouth daily with supper. [name],  PA-C  Active Self  TRUEplus Lancets 33G MISC [id number]  1 each by Does not apply route 2 (two) times daily. Blane Ohara, MD  Active Self  TURMERIC PO [id number] No Take 1 capsule by mouth every evening.  Patient not taking: Reported on 07/06/2021   [provider] Not Taking Consider Medication Status and Discontinue Self  ustekinumab (STELARA) 90 MG/ML SOSY injection [id number]  Inject 90 mg into the skin every 8 (eight) weeks. [provider]  Active Self  zolpidem (AMBIEN) 5 MG tablet [id number]  TAKE 1 TABLET BY MOUTH EVERY DAY AT BEDTIME AS NEEDED FOR SLEEP [name], Kirsten, MD  Active Self            Patient Active Problem List   Diagnosis Date Noted   B12 deficiency 04/23/2021   Persistent atrial fibrillation (HCC) 04/23/2021   Prediabetes 08/25/2020   Idiopathic neuropathy 08/25/2020   Myalgia 10/26/2019   Paresthesia 07/06/2019   Hyperlipidemia associated with type 2 diabetes mellitus (HCC) 07/06/2019   BMI 26.0-26.9,adult 07/06/2019   Palpitations 07/06/2019   Hypertension, essential    Mixed hyperlipidemia    DDD (degenerative disc disease), lumbar 07/17/2017   Other fatigue 08/10/2014   Bradycardia 08/10/2014   Crohn's disease (HCC) 02/24/2013    Immunization History  Administered Date(s) Administered   Influenza Inj Mdck Quad Pf 02/17/2020, 01/02/2021   Influenza,inj,Quad PF,6+ Mos 02/17/2020   Influenza-Unspecified 06/03/2019   PFIZER(Purple Top)SARS-COV-2 Vaccination 07/30/2019, 08/25/2019   Pneumococcal Conjugate-13 11/01/2015   Pneumococcal Polysaccharide-23 03/15/2013    Conditions to be addressed/monitored:  Hyperlipidemia, Diabetes, and Atrial Fibrillation  Care Plan : CCM Pharmacy Care Plan  Updates made by Zettie Pho, RPH since 07/06/2021 12:00 AM     Problem: Disease State Management   Priority: High  Onset Date: 07/06/2021     Long-Range Goal: Lipids,DM, AFib   Start Date: 07/06/2021  Expected End Date: 07/06/2022   This Visit's Progress: On track  Priority: High  Note:   Current Barriers:  Does not contact provider office for questions/concerns  Pharmacist Clinical Goal(s):  Patient will contact provider office for questions/concerns as evidenced notation of same in electronic health record through collaboration with PharmD and provider.   Interventions: 1:1 collaboration with [name], Kirsten, MD regarding development and update of comprehensive plan of care as evidenced by provider attestation and co-signature Inter-disciplinary care team collaboration (see longitudinal plan of care) Comprehensive medication review performed; medication list updated in electronic medical record   Hyperlipidemia: (LDL goal < 70) The 10-year ASCVD risk score (Arnett DK, et al., 2019) is: 4.9%   Values used to calculate the score:     Age: 75 years     Sex: Female     Is Non-Hispanic African American: No     Diabetic: Yes     Tobacco smoker: No     Systolic Blood Pressure: 124 mmHg     Is BP treated: Yes     HDL Cholesterol: 70 mg/dL     Total Cholesterol: 204 mg/dL Lab Results  Component Value Date   CHOL 204 (H) 04/10/2021   CHOL 232 (H) 01/02/2021   CHOL 234 (H) 08/25/2020   Lab Results  Component Value Date   HDL 70 04/10/2021   HDL 81 01/02/2021   HDL 95 08/25/2020   Lab Results  Component Value Date   LDLCALC 109 (H) 04/10/2021   LDLCALC 129 (H) 01/02/2021   LDLCALC 124 (H) 08/25/2020   Lab Results  Component Value Date  TRIG 143 04/10/2021   TRIG 126 01/02/2021   TRIG 90 08/25/2020   Lab Results  Component Value Date   CHOLHDL 2.9 04/10/2021   CHOLHDL 2.9 01/02/2021   CHOLHDL 2.5 08/25/2020  No results found for: LDLDIRECT -Uncontrolled -Current treatment: Ezetimibe 10mg  QD Appropriate, Query effective, Safe, Accessible -Medications previously tried: Statins  -Current dietary patterns: "Tries to eat healthy" -Current exercise habits: N/A -Educated on Cholesterol goals;   -Recommended to continue current medication  Pre-Diabetes (A1c goal <6.5%) Lab Results  Component Value Date   HGBA1C 5.6 04/10/2021   HGBA1C 5.6 01/02/2021   HGBA1C 5.6 08/25/2020   Lab Results  Component Value Date   MICROALBUR 30 08/25/2020   LDLCALC 109 (H) 04/10/2021   CREATININE 0.80 07/03/2021   Lab Results  Component Value Date   NA 139 07/03/2021   K 3.6 07/03/2021   CREATININE 0.80 07/03/2021   EGFR 102 04/10/2021   GFRNONAA >60 04/01/2021   GLUCOSE 98 07/03/2021   Lab Results  Component Value Date   WBC 5.7 04/10/2021   HGB 16.0 (H) 07/03/2021   HCT 47.0 (H) 07/03/2021   MCV 89 04/10/2021   PLT 195 04/10/2021  -Controlled -Current medications: N/A -Medications previously tried: N/A  -Current home glucose readings fasting glucose: N/A post prandial glucose: N/A -Denies hypoglycemic/hyperglycemic symptoms -Current exercise: N/A -Educated on A1c and blood sugar goals; -Counseled to check feet daily and get yearly eye exams -Recommended to continue current medication  Atrial Fibrillation (Goal: prevent stroke and major bleeding) -Controlled -CHADSVASC: 1 -Current treatment: Rate control:  Metoprolol Succ 25mg  QD Appropriate, Effective, Safe, Accessible Anticoagulation:  Rivaroxaban 20mg  QD Appropriate, Effective, Safe, Accessible -Medications previously tried: Metoprolol Tartrate (Lethargy) -Home BP and HR readings: Didn't have list  -Counseled on increased risk of stroke due to Afib and benefits of anticoagulation for stroke prevention; -Recommended to continue current medication  Patient Goals/Self-Care Activities Patient will:  - take medications as prescribed as evidenced by patient report and record review  Follow Up Plan: The patient has been provided with contact information for the care management team and has been advised to call with any health related questions or concerns.   CPP F/U August 2023  [place] Constable, Sherian Rein.D. -  [phone]        Medication Assistance: None required.  Patient affirms current coverage meets needs.  Compliance/Adherence/Medication fill history: Care Gaps: Last annual wellness visit? 06-06-2020 Yearly foot exam overdue Tdap overdue Shingrix overdue   Star Rating Drugs: None  Patient's preferred pharmacy is:  [place], [place] [address] [place] [address] Phone: [phone] Fax: [phone]  [place], [place] [address] Phone: [phone] Fax: [phone]  [id number] [place], [place] [address] [place] [address] Phone: [phone] Fax: [phone]  [place] [address], [place] [address] Phone: [phone] Fax: [phone]  Uses pill box? No -   Pt endorses 100% compliance  Care Plan and Follow Up Patient Decision:  Patient agrees to Care Plan and Follow-up.  Plan: The patient has been provided with contact information for the care management team and has been advised to call with any health related questions or concerns.   CPP F/U August 2023  [place] Constable, Sherian Rein.D. - [phone]

## 2021-07-10 DIAGNOSIS — I4891 Unspecified atrial fibrillation: Secondary | ICD-10-CM

## 2021-07-10 DIAGNOSIS — E782 Mixed hyperlipidemia: Secondary | ICD-10-CM | POA: Diagnosis not present

## 2021-07-10 DIAGNOSIS — E1169 Type 2 diabetes mellitus with other specified complication: Secondary | ICD-10-CM

## 2021-07-10 DIAGNOSIS — E11649 Type 2 diabetes mellitus with hypoglycemia without coma: Secondary | ICD-10-CM

## 2021-07-10 DIAGNOSIS — E785 Hyperlipidemia, unspecified: Secondary | ICD-10-CM

## 2021-07-23 ENCOUNTER — Telehealth: Payer: Self-pay | Admitting: Cardiology

## 2021-07-23 NOTE — Telephone Encounter (Signed)
?  Pt c/o medication issue: ? ?1. Name of Medication:  ? ?metoprolol succinate (TOPROL XL) 25 MG 24 hr tablet ? ?2. How are you currently taking this medication (dosage and times per day)?  ? ?Take 0.5 tablets (12.5 mg total) by mouth daily. ? ?3. Are you having a reaction (difficulty breathing--STAT)?  No ? ?4. What is your medication issue?  She states that her heart rate is running low and she wants to know if she needs to continue on this medication or not.  ?

## 2021-07-23 NOTE — Telephone Encounter (Signed)
Spoke with the patient who reports that her heart rate has been running low and she would like to know if she should continue her metoprolol succinate 12.5 mg daily. She states that she takes it in the evening. Last night her HR was 52 so she did not take it. She states that currently her HR is 62. When she is up and moving it will be in the 29s. At times it will drop into the upper 40s. She does have some dizziness and lightheadedness at times. Doesn't feel as if she is going to pass out. Advised patient to continue to monitor HR and to hold metoprolol if HR is below 60, especially if she is symptomatic. Will make Dr. Curt Bears aware to see if any adjustments need to be made.  ?

## 2021-07-24 NOTE — Telephone Encounter (Signed)
Spoke with pt and pt is willing to try and take Metoprolol every other day 12.5 mg Pt also aware may hold med if needs to Per Dr Curt Bears Will forward to Dr Curt Bears for review ./cy ?

## 2021-07-26 NOTE — Telephone Encounter (Signed)
See 3/13 telephone note for more on this issue.  ?

## 2021-08-08 ENCOUNTER — Other Ambulatory Visit: Payer: Self-pay

## 2021-08-08 ENCOUNTER — Ambulatory Visit (INDEPENDENT_AMBULATORY_CARE_PROVIDER_SITE_OTHER): Payer: Medicare HMO | Admitting: Family Medicine

## 2021-08-08 ENCOUNTER — Encounter: Payer: Self-pay | Admitting: Family Medicine

## 2021-08-08 VITALS — BP 120/64 | HR 84 | Temp 96.5°F | Resp 18 | Ht 65.0 in | Wt 177.0 lb

## 2021-08-08 DIAGNOSIS — I4819 Other persistent atrial fibrillation: Secondary | ICD-10-CM

## 2021-08-08 DIAGNOSIS — E538 Deficiency of other specified B group vitamins: Secondary | ICD-10-CM

## 2021-08-08 DIAGNOSIS — R7303 Prediabetes: Secondary | ICD-10-CM | POA: Diagnosis not present

## 2021-08-08 DIAGNOSIS — Z6829 Body mass index (BMI) 29.0-29.9, adult: Secondary | ICD-10-CM

## 2021-08-08 DIAGNOSIS — I10 Essential (primary) hypertension: Secondary | ICD-10-CM | POA: Diagnosis not present

## 2021-08-08 DIAGNOSIS — K5080 Crohn's disease of both small and large intestine without complications: Secondary | ICD-10-CM | POA: Diagnosis not present

## 2021-08-08 DIAGNOSIS — G609 Hereditary and idiopathic neuropathy, unspecified: Secondary | ICD-10-CM | POA: Diagnosis not present

## 2021-08-08 DIAGNOSIS — E782 Mixed hyperlipidemia: Secondary | ICD-10-CM

## 2021-08-08 MED ORDER — CIPROFLOXACIN HCL 500 MG PO TABS: 500.0000 mg | ORAL_TABLET | Freq: Two times a day (BID) | ORAL | 0 refills | Status: AC

## 2021-08-08 NOTE — Assessment & Plan Note (Signed)
Well controlled.  ?No changes to medicines.  ?Continue to work on eating a healthy diet and exercise.  ?Labs drawn today.  ?

## 2021-08-08 NOTE — Assessment & Plan Note (Addendum)
Start on cipro 500 mg twice daily x 1 week. ?Patient due for Colonoscopy ?Sent referral to GI. ?

## 2021-08-08 NOTE — Assessment & Plan Note (Signed)
Recommend continue to work on eating healthy diet and exercise.  

## 2021-08-08 NOTE — Assessment & Plan Note (Signed)
Continue Gabapentin 300 mg 2 tablets at night ?

## 2021-08-08 NOTE — Progress Notes (Signed)
? ?Subjective:  ?Patient ID: Brittany [name], female    DOB: 1965/07/08  Age: 56 y.o. MRN: [id number] ? ?Chief Complaint  ?Patient presents with  ? Hyperlipidemia  ? Diabetes  ? ?HPI: ?Prediabetes: eats healthy. Exercising. Checking sugars daily. Sugars 95-99 ?Hypoglycemia: 3 times a week at night.   ? ?Crohn's disease: Diarrhea 4-6 times per day. ON stelara 90 mg injection every 8 weeks. Usually would go on cipro. Is due to see Gi for colonoscopy, but needs a new referral.  ? ?Mixed hyperlipidemia: Zetia and fish oil.  ? ?Neuropathy: on gabapentin 300 mg two at night.  ? ?Atrial fibrillation: on xarelto 20 mg once daily. ON no rate control medications. Has toprol xl 25 mg 1/2 pill once daily as needed.  ? ?Insomnia: ambien 5 mg once daily at night. Patient sleeps hard, but talks in her sleep. Has tried to wean off, but awakens in the middle of the night.  ? ?Current Outpatient Medications on File Prior to Visit  ?Medication Sig Dispense Refill  ? Biotin 1000 MCG tablet Take 1,000 mcg by mouth daily.    ? calcium-vitamin D (OSCAL WITH D) 500-200 MG-UNIT per tablet Take 1 tablet by mouth daily.    ? cholecalciferol (VITAMIN D3) 25 MCG (1000 UNIT) tablet Take 1,000 Units by mouth 3 (three) times a week.    ? estradiol (ESTRACE) 1 MG tablet TAKE 1 TABLET EVERY DAY 90 tablet 0  ? ezetimibe (ZETIA) 10 MG tablet Take 1 tablet (10 mg total) by mouth daily. 90 tablet 1  ? fish oil-omega-3 fatty acids 1000 MG capsule Take 1 g by mouth in the morning and at bedtime.    ? gabapentin (NEURONTIN) 300 MG capsule Take 1 capsule (300 mg total) by mouth 3 (three) times daily. (Patient taking differently: Take 600 mg by mouth at bedtime.) 270 capsule 1  ? Glucosamine-Chondroit-Vit C-Mn (GLUCOSAMINE 1500 COMPLEX PO) Take 2 tablets by mouth at bedtime.    ? rivaroxaban (XARELTO) 20 MG TABS tablet Take 1 tablet (20 mg total) by mouth daily with supper. 90 tablet 1  ? TRUEplus Lancets 33G MISC 1 each by Does not apply route 2 (two)  times daily. 100 each 5  ? ustekinumab (STELARA) 90 MG/ML SOSY injection Inject 90 mg into the skin every 8 (eight) weeks.    ? zolpidem (AMBIEN) 5 MG tablet TAKE 1 TABLET BY MOUTH EVERY DAY AT BEDTIME AS NEEDED FOR SLEEP 90 tablet 0  ? blood glucose meter kit and supplies KIT Check sugars daily as needed. Hypoglycemia e16.2 1 each 0  ? glucose blood (TRUE METRIX BLOOD GLUCOSE TEST) test strip Use as instructed (Patient taking differently: 1 each 3 (three) times daily after meals, bedtime and 0200. Use as instructed) 100 each 12  ? metoprolol succinate (TOPROL XL) 25 MG 24 hr tablet Take 0.5 tablets (12.5 mg total) by mouth daily. (Patient not taking: Reported on 08/08/2021) 90 tablet 3  ? ?No current facility-administered medications on file prior to visit.  ? ?Past Medical History:  ?Diagnosis Date  ? BMI 26.0-26.9,adult 07/06/2019  ? Bradycardia 08/10/2014  ? Crohn's disease ([place])   ? DDD (degenerative disc disease), lumbar 07/17/2017  ? Diabetes mellitus without complication ([place])   ? Fatigue 08/10/2014  ? Hyperlipidemia   ? Hyperlipidemia associated with type 2 diabetes mellitus ([place]) 07/06/2019  ? Hypertension   ? Hypertension, essential   ? Mixed hyperlipidemia   ? Palpitations 07/06/2019  ? Paresthesia 07/06/2019  ? ?Past Surgical  History:  ?Procedure Laterality Date  ? ABDOMINAL HYSTERECTOMY N/A 10/20/2012  ? Procedure: HYSTERECTOMY ABDOMINAL;  Surgeon: [name], MD;  Location: [facility];  Service: Gynecology;  Laterality: N/A;  ? ANAL FISSURE REPAIR  2002  ? APPENDECTOMY    ? CARDIOVERSION N/A 07/03/2021  ? Procedure: CARDIOVERSION;  Surgeon: Donato Heinz, MD;  Location: [facility] END[facility]COPY;  Service: Cardiovascular;  Laterality: N/A;  ? SALPINGOOPHORECTOMY Bilateral 10/20/2012  ? Procedure: SALPINGO OOPHORECTOMY;  Surgeon: [name], MD;  Location: [facility];  Service: Gynecology;  Laterality: Bilateral;  ? SMALL INTESTINE SURGERY    ? reconstruction  ? TONSILLECTOMY    ?  ?Family History  ?Problem Relation Age of  Onset  ? Heart Problems Mother   ? Diabetes Mother   ? Diabetes Father   ? Heart Problems Father   ? Cancer Maternal Grandmother   ?     lung  ? Peptic Ulcer Disease Other   ? ?Social History  ? ?Socioeconomic History  ? Marital status: Widowed  ?  Spouse name: Not on file  ? Number of children: 1  ? Years of education: Not on file  ? Highest education level: Not on file  ?Occupational History  ? Not on file  ?Tobacco Use  ? Smoking status: Former  ?  Types: Cigarettes  ?  Quit date: 2002  ?  Years since quitting: 21.2  ? Smokeless tobacco: Never  ?Vaping Use  ? Vaping Use: Never used  ?Substance and Sexual Activity  ? Alcohol use: Yes  ? Drug use: No  ? Sexual activity: Not on file  ?Other Topics Concern  ? Not on file  ?Social History Narrative  ? wears sunscreen, brushes and flosses daily, see's dentist bi-annually, has smoke/carbon monoxide detectors, wears a seatbelt and practices gun safety  ? ?Social Determinants of Health  ? ?Financial Resource Strain: Low Risk   ? Difficulty of Paying Living Expenses: Not hard at all  ?Food Insecurity: Not on file  ?Transportation Needs: No Transportation Needs  ? Lack of Transportation (Medical): No  ? Lack of Transportation (Non-Medical): No  ?Physical Activity: Not on file  ?Stress: Not on file  ?Social Connections: Not on file  ? ? ?Review of Systems  ?Constitutional:  Negative for chills, fatigue and fever.  ?HENT:  Negative for congestion, rhinorrhea and sore throat.   ?Respiratory:  Negative for cough and shortness of breath.   ?Cardiovascular:  Positive for leg swelling. Negative for chest pain and palpitations.  ?Gastrointestinal:  Positive for diarrhea (4-6 times daily). Negative for abdominal pain, constipation, nausea and vomiting.  ?Endocrine: Negative for polydipsia and polyphagia.  ?Genitourinary:  Negative for dysuria and urgency.  ?Musculoskeletal:  Positive for arthralgias (bilateral knee pain and hands) and back pain. Negative for myalgias.   ?Neurological:  Positive for light-headedness (much improved since holding metoprolol). Negative for dizziness, weakness and headaches.  ?Psychiatric/Behavioral:  Negative for dysphoric mood. The patient is not nervous/anxious.   ? ? ?Objective:  ?BP 120/64   Pulse 84   Temp (!) 96.5 ?F (35.8 ?C)   Resp 18   Ht $R'5\' 5"'ir$  (1.651 m)   Wt 177 lb (80.3 kg)   LMP 09/23/2012   BMI 29.45 kg/m?  ? ? ?  08/08/2021  ?  7:59 AM 07/03/2021  ?  9:35 AM 07/03/2021  ?  9:25 AM  ?BP/Weight  ?Systolic BP 768 115 726  ?Diastolic BP 64 63 66  ?Wt. (Lbs) 177    ?BMI 29.45  kg/m2    ? ? ?Physical Exam ?Vitals reviewed.  ?Constitutional:   ?   Appearance: Normal appearance. She is normal weight.  ?Neck:  ?   Vascular: No carotid bruit.  ?Cardiovascular:  ?   Rate and Rhythm: Normal rate and regular rhythm.  ?   Heart sounds: Normal heart sounds.  ?Pulmonary:  ?   Effort: Pulmonary effort is normal. No respiratory distress.  ?   Breath sounds: Normal breath sounds.  ?Abdominal:  ?   General: Abdomen is flat. Bowel sounds are normal.  ?   Palpations: Abdomen is soft.  ?   Tenderness: There is no abdominal tenderness.  ?Neurological:  ?   Mental Status: She is alert and oriented to person, place, and time.  ?Psychiatric:     ?   Mood and Affect: Mood normal.     ?   Behavior: Behavior normal.  ? ? ?Diabetic Foot Exam - Simple   ?Simple Foot Form ?Diabetic Foot exam was performed with the following findings: Yes 08/08/2021  8:51 AM  ?Visual Inspection ?No deformities, no ulcerations, no other skin breakdown bilaterally: Yes ?Sensation Testing ?See comments: Yes ?Pulse Check ?Posterior Tibialis and Dorsalis pulse intact bilaterally: Yes ?Comments ?Decreased sensation. ?  ?  ? ?Lab Results  ?Component Value Date  ? WBC 5.7 04/10/2021  ? HGB 16.0 (H) 07/03/2021  ? HCT 47.0 (H) 07/03/2021  ? PLT 195 04/10/2021  ? GLUCOSE 98 07/03/2021  ? CHOL 204 (H) 04/10/2021  ? TRIG 143 04/10/2021  ? HDL 70 04/10/2021  ? [place] 109 (H) 04/10/2021  ? ALT 11  04/10/2021  ? AST 15 04/10/2021  ? NA 139 07/03/2021  ? K 3.6 07/03/2021  ? CL 102 07/03/2021  ? CREATININE 0.80 07/03/2021  ? BUN 17 07/03/2021  ? CO2 26 04/10/2021  ? TSH 1.892 04/01/2021  ? INR 0.86 10/20/2012  ? HGBA1C 5.6

## 2021-08-08 NOTE — Assessment & Plan Note (Signed)
Labs drawn

## 2021-08-08 NOTE — Assessment & Plan Note (Signed)
Continue current medications.  ?Labs drawn today. ?

## 2021-08-09 ENCOUNTER — Other Ambulatory Visit: Payer: Self-pay | Admitting: Family Medicine

## 2021-08-09 DIAGNOSIS — E782 Mixed hyperlipidemia: Secondary | ICD-10-CM

## 2021-08-09 NOTE — Telephone Encounter (Signed)
Refill sent to pharmacy.   

## 2021-08-10 DIAGNOSIS — K50819 Crohn's disease of both small and large intestine with unspecified complications: Principal | ICD-10-CM

## 2021-08-10 LAB — COMPREHENSIVE METABOLIC PANEL
ALT: 10 IU/L (ref 0–32)
AST: 18 IU/L (ref 0–40)
Albumin/Globulin Ratio: 1.9 (ref 1.2–2.2)
Albumin: 4.5 g/dL (ref 3.8–4.9)
Alkaline Phosphatase: 63 IU/L (ref 44–121)
BUN/Creatinine Ratio: 18 (ref 9–23)
BUN: 12 mg/dL (ref 6–24)
Bilirubin Total: 0.6 mg/dL (ref 0.0–1.2)
CO2: 26 mmol/L (ref 20–29)
Calcium: 9.8 mg/dL (ref 8.7–10.2)
Chloride: 103 mmol/L (ref 96–106)
Creatinine, Ser: 0.68 mg/dL (ref 0.57–1.00)
Globulin, Total: 2.4 g/dL (ref 1.5–4.5)
Glucose: 89 mg/dL (ref 70–99)
Potassium: 4.3 mmol/L (ref 3.5–5.2)
Sodium: 141 mmol/L (ref 134–144)
Total Protein: 6.9 g/dL (ref 6.0–8.5)
eGFR: 102 mL/min/{1.73_m2} (ref >59)

## 2021-08-10 LAB — LIPID PANEL
Chol/HDL Ratio: 2.3 ratio (ref 0.0–4.4)
Cholesterol, Total: 222 mg/dL — ABNORMAL HIGH (ref 100–199)
HDL: 97 mg/dL (ref >39)
LDL Chol Calc (NIH): 106 mg/dL — ABNORMAL HIGH (ref 0–99)
Triglycerides: 114 mg/dL (ref 0–149)
VLDL Cholesterol Cal: 19 mg/dL (ref 5–40)

## 2021-08-10 LAB — CBC WITH DIFFERENTIAL/PLATELET
Basophils Absolute: 0.1 10*3/uL (ref 0.0–0.2)
Basos: 1 %
EOS (ABSOLUTE): 0.1 10*3/uL (ref 0.0–0.4)
Eos: 1 %
Hematocrit: 44.5 % (ref 34.0–46.6)
Hemoglobin: 14.9 g/dL (ref 11.1–15.9)
Immature Grans (Abs): 0 10*3/uL (ref 0.0–0.1)
Immature Granulocytes: 0 %
Lymphocytes Absolute: 1.9 10*3/uL (ref 0.7–3.1)
Lymphs: 30 %
MCH: 30.2 pg (ref 26.6–33.0)
MCHC: 33.5 g/dL (ref 31.5–35.7)
MCV: 90 fL (ref 79–97)
Monocytes Absolute: 0.5 10*3/uL (ref 0.1–0.9)
Monocytes: 8 %
Neutrophils Absolute: 3.7 10*3/uL (ref 1.4–7.0)
Neutrophils: 60 %
Platelets: 216 10*3/uL (ref 150–450)
RBC: 4.93 x10E6/uL (ref 3.77–5.28)
RDW: 13.1 % (ref 11.7–15.4)
WBC: 6.1 10*3/uL (ref 3.4–10.8)

## 2021-08-10 LAB — SEDIMENTATION RATE: Sed Rate: 23 mm/hr (ref 0–40)

## 2021-08-10 LAB — METHYLMALONIC ACID, SERUM: Methylmalonic Acid: 107 nmol/L (ref 0–378)

## 2021-08-10 LAB — HEMOGLOBIN A1C
Est. average glucose Bld gHb Est-mCnc: 111 mg/dL
Hgb A1c MFr Bld: 5.5 % (ref 4.8–5.6)

## 2021-08-10 LAB — VITAMIN B12: Vitamin B-12: 462 pg/mL (ref 232–1245)

## 2021-08-10 LAB — CARDIOVASCULAR RISK ASSESSMENT

## 2021-08-11 NOTE — Progress Notes (Signed)
Blood count normal.  ?Liver function normal.  ?Kidney function normal.  ?Cholesterol: LDL little high. Improving however. Continue to eat healthy. ?HBA1C: good. ?B12, MMA, and sedimentation rate normal.  ?

## 2021-08-28 ENCOUNTER — Other Ambulatory Visit: Payer: Self-pay | Admitting: Family Medicine

## 2021-08-28 ENCOUNTER — Encounter: Payer: Self-pay | Admitting: Cardiology

## 2021-08-28 ENCOUNTER — Other Ambulatory Visit: Payer: Self-pay | Admitting: Physician Assistant

## 2021-08-28 ENCOUNTER — Ambulatory Visit: Payer: Medicare HMO | Admitting: Cardiology

## 2021-08-28 VITALS — BP 134/82 | HR 55 | Ht 65.0 in | Wt 179.0 lb

## 2021-08-28 DIAGNOSIS — I4819 Other persistent atrial fibrillation: Secondary | ICD-10-CM | POA: Diagnosis not present

## 2021-08-28 DIAGNOSIS — E782 Mixed hyperlipidemia: Secondary | ICD-10-CM

## 2021-08-28 NOTE — Patient Instructions (Signed)
Medication Instructions:  ?STOP Xarelto ? ?*If you need a refill on your cardiac medications before your next appointment, please call your pharmacy* ? ? ?Lab Work: ?None ordered ? ? ? ?Testing/Procedures: ?None ordered ? ? ?Follow-Up: ?At [name], you and your health needs are our priority.  As part of our continuing mission to provide you with exceptional heart care, we have created designated Provider Care Teams.  These Care Teams include your primary Cardiologist (physician) and Advanced Practice Providers (APPs -  Physician Assistants and Nurse Practitioners) who all work together to provide you with the care you need, when you need it. ? ? ? ?Your next appointment:   ?6 month(s) ? ?The format for your next appointment:   ?In Person ? ?Provider:   ?Dr. Curt Bears ? ? ? ?Thank you for choosing [facility]!! ? ? ?Trinidad Curet, RN ?[phone] ? ? ?  ?

## 2021-08-28 NOTE — Progress Notes (Signed)
? ?Electrophysiology Office Note ? ? ?Date:  08/28/2021  ? ?ID:  Brittany [name], DOB [birth year], MRN [id number] ? ?PCP:  [name], MD  ?Cardiologist:   ?Primary Electrophysiologist:  Brittany Trolinger Meredith Leeds, MD   ? ?Chief Complaint: bradycardia ?  ?History of Present Illness: ?Brittany [name] is a 56 y.o. female who is being seen today for the evaluation of bradycardia at the request of [name], Brittany Maxwell, MD. Presenting today for electrophysiology evaluation. ? ?She has a history seen for diabetes, hypertension, hyperlipidemia, Crohn's disease.  She presented to cardiology clinic post-COVID with palpitations and bradycardia.  She was noted to have heart rates in the 40s with fatigue.  She presented emergency room 04/01/2021 with chest pain and palpitations and was found to be in atrial fibrillation.  She had not of atrial fibrillation November 18. ? ?Today, denies symptoms of palpitations, chest pain, shortness of breath, orthopnea, PND, lower extremity edema, claudication, dizziness, presyncope, syncope, bleeding, or neurologic sequela. The patient is tolerating medications without difficulties.  Overall she is feeling well.  She is status post cardioversion 07/03/2021.  She has not had any recurrence of her atrial fibrillation.  Unfortunately her mother died and she is moving into her mother's old house over the next few months.  She also is under quite a bit of stress.  She does not want any further therapy for now, but understands that she may need rhythm control eventually. ? ? ?Past Medical History:  ?Diagnosis Date  ? BMI 26.0-26.9,adult 07/06/2019  ? Bradycardia 08/10/2014  ? Crohn's disease ([place])   ? DDD (degenerative disc disease), lumbar 07/17/2017  ? Diabetes mellitus without complication ([place])   ? Fatigue 08/10/2014  ? Hyperlipidemia   ? Hyperlipidemia associated with type 2 diabetes mellitus ([place]) 07/06/2019  ? Hypertension   ? Hypertension, essential   ? Mixed hyperlipidemia   ? Palpitations 07/06/2019  ?  Paresthesia 07/06/2019  ? ?Past Surgical History:  ?Procedure Laterality Date  ? ABDOMINAL HYSTERECTOMY N/A 10/20/2012  ? Procedure: HYSTERECTOMY ABDOMINAL;  Surgeon: [name], MD;  Location: [facility];  Service: Gynecology;  Laterality: N/A;  ? ANAL FISSURE REPAIR  2002  ? APPENDECTOMY    ? CARDIOVERSION N/A 07/03/2021  ? Procedure: CARDIOVERSION;  Surgeon: Donato Heinz, MD;  [facility] END[facility]COPY;  Service: Cardiovascular;  Laterality: N/A;  ? SALPINGOOPHORECTOMY Bilateral 10/20/2012  ? Procedure: SALPINGO OOPHORECTOMY;  Surgeon: [name], MD;  Location: [facility];  Service: Gynecology;  Laterality: Bilateral;  ? SMALL INTESTINE SURGERY    ? reconstruction  ? TONSILLECTOMY    ? ? ? ?Current Outpatient Medications  ?Medication Sig Dispense Refill  ? Biotin 1000 MCG tablet Take 1,000 mcg by mouth daily.    ? blood glucose meter kit and supplies KIT Check sugars daily as needed. Hypoglycemia e16.2 1 each 0  ? calcium-vitamin D (OSCAL WITH D) 500-200 MG-UNIT per tablet Take 1 tablet by mouth daily.    ? cholecalciferol (VITAMIN D3) 25 MCG (1000 UNIT) tablet Take 1,000 Units by mouth 3 (three) times a week.    ? estradiol (ESTRACE) 1 MG tablet TAKE 1 TABLET EVERY DAY 90 tablet 0  ? ezetimibe (ZETIA) 10 MG tablet TAKE 1 TABLET (10 MG TOTAL) BY MOUTH DAILY. 90 tablet 1  ? fish oil-omega-3 fatty acids 1000 MG capsule Take 1 g by mouth in the morning and at bedtime.    ? gabapentin (NEURONTIN) 300 MG capsule Take 1 capsule (300 mg total) by mouth 3 (three) times daily. (  Patient taking differently: Take 600 mg by mouth 3 (three) times daily.) 270 capsule 1  ? Glucosamine-Chondroit-Vit C-Mn (GLUCOSAMINE 1500 COMPLEX PO) Take 2 tablets by mouth at bedtime.    ? glucose blood (TRUE METRIX BLOOD GLUCOSE TEST) test strip Use as instructed (Patient taking differently: 1 each 3 (three) times daily after meals, bedtime and 0200. Use as instructed) 100 each 12  ? TRUEplus Lancets 33G MISC 1 each by Does not apply route 2 (two)  times daily. 100 each 5  ? ustekinumab (STELARA) 90 MG/ML SOSY injection Inject 90 mg into the skin every 8 (eight) weeks.    ? zolpidem (AMBIEN) 5 MG tablet TAKE 1 TABLET BY MOUTH EVERY DAY AT BEDTIME AS NEEDED FOR SLEEP 90 tablet 0  ? ?No current facility-administered medications for this visit.  ? ? ?Allergies:   Mercaptopurine, Amitriptyline, Crestor [rosuvastatin calcium], Metoprolol, Trazodone and nefazodone, Zocor [simvastatin], Morphine and related, and Penicillins  ? ?Social History:  The patient  reports that she quit smoking about 21 years ago. Her smoking use included cigarettes. She has never used smokeless tobacco. She reports current alcohol use. She reports that she does not use drugs.  ? ?Family History:  The patient's family history includes Cancer in her maternal grandmother; Diabetes in her father and mother; Heart Problems in her father and mother; Peptic Ulcer Disease in an other family member.  ? ?ROS:  Please see the history of present illness.   Otherwise, review of systems is positive for none.   All other systems are reviewed and negative.  ? ?PHYSICAL EXAM: ?VS:  BP 134/82   Pulse (!) 55   Ht $R'5\' 5"'dC$  (1.651 m)   Wt 179 lb (81.2 kg)   LMP 09/23/2012   SpO2 99%   BMI 29.79 kg/m?  , BMI Body mass index is 29.79 kg/m?. ?GEN: Well nourished, well developed, in no acute distress  ?HEENT: normal  ?Neck: no JVD, carotid bruits, or masses ?Cardiac: RRR; no murmurs, rubs, or gallops,no edema  ?Respiratory:  clear to auscultation bilaterally, normal work of breathing ?GI: soft, nontender, nondistended, + BS ?MS: no deformity or atrophy  ?Skin: warm and dry ?Neuro:  Strength and sensation are intact ?Psych: euthymic mood, full affect ? ?EKG:  EKG is ordered today. ?Personal review of the ekg ordered shows sinus rhythm, rate 55 ? ?Recent Labs: ?04/01/2021: B Natriuretic Peptide 230.1; Magnesium 2.0; TSH 1.892 ?08/08/2021: ALT 10; BUN 12; Creatinine, Ser 0.68; Hemoglobin 14.9; Platelets 216;  Potassium 4.3; Sodium 141  ? ? ?Lipid Panel  ?   ?Component Value Date/Time  ? CHOL 222 (H) 08/08/2021 0853  ? TRIG 114 08/08/2021 0853  ? HDL 97 08/08/2021 0853  ? CHOLHDL 2.3 08/08/2021 0853  ? [place] 106 (H) 08/08/2021 0853  ? ? ? ?Wt Readings from Last 3 Encounters:  ?08/28/21 179 lb (81.2 kg)  ?08/08/21 177 lb (80.3 kg)  ?07/03/21 180 lb (81.6 kg)  ?  ? ? ?Other studies Reviewed: ?Additional studies/ records that were reviewed today include: TTE 05/01/21 ?Review of the above records today demonstrates:  ? 1. Left ventricular ejection fraction, by estimation, is 60 to 65%. The  ?left ventricle has normal function. The left ventricle has no regional  ?wall motion abnormalities. There is mild left ventricular hypertrophy of  ?the inferior segment. Left  ?ventricular diastolic parameters are indeterminate.  ? 2. Right ventricular systolic function is normal. The right ventricular  ?size is normal.  ? 3. Left atrial size was mildly  dilated.  ? 4. The mitral valve is normal in structure. Mild mitral valve  ?regurgitation. No evidence of mitral stenosis.  ? 5. The aortic valve is normal in structure. Aortic valve regurgitation is  ?not visualized. No aortic stenosis is present.  ? 6. The inferior vena cava is normal in size with greater than 50%  ?respiratory variability, suggesting right atrial pressure of 3 mmHg.  ? ? ?Myoview 04/18/2021 ?Low risk stress nuclear study with normal perfusion and normal left ventricular regional and global systolic function. ? ?ASSESSMENT AND PLAN: ? ?1.  Persistent atrial fibrillation: CHA2DS2-VASc of 1.  Currently on Xarelto 20 mg daily.  Had a cardioversion and has not had any more episodes of atrial fibrillation.  As she is remained in sinus rhythm, Coulson Wehner stop Xarelto today.  She Tidus Upchurch restart it if she goes back into atrial fibrillation. ? ?2.  Hypertension: Has been well controlled since she is lost weight.  On no medications at this time. ? ?3.  Hyperlipidemia: Diet and exercise  controlled ? ?Current medicines are reviewed at length with the patient today.   ?The patient does not have concerns regarding her medicines.  The following changes were made today: stop xarelto ? ?Labs/ tests ord

## 2021-10-17 DIAGNOSIS — Z1231 Encounter for screening mammogram for malignant neoplasm of breast: Secondary | ICD-10-CM | POA: Diagnosis not present

## 2021-10-17 LAB — HM MAMMOGRAPHY

## 2021-10-24 ENCOUNTER — Telehealth: Payer: Self-pay | Admitting: Cardiology

## 2021-10-24 ENCOUNTER — Other Ambulatory Visit: Payer: Self-pay | Admitting: Physician Assistant

## 2021-10-24 DIAGNOSIS — I4819 Other persistent atrial fibrillation: Secondary | ICD-10-CM

## 2021-10-24 NOTE — Telephone Encounter (Signed)
Pt c/o medication issue:  1. Name of Medication: Xarelto  2. How are you currently taking this medication (dosage and times per day)? 1 tablet daily  3. Are you having a reaction (difficulty breathing--STAT)?   4. What is your medication issue? Patient was supposed to go off this medication, but she has been scared to because of her A-Fib, she would like to speak to nurse about that.

## 2021-10-25 ENCOUNTER — Telehealth: Payer: Self-pay

## 2021-10-25 NOTE — Chronic Care Management (AMB) (Signed)
Chronic Care Management Pharmacy Assistant   Name: Brittany [name]  MRN: [id number] DOB: July 20, 1965  Reason for Encounter: Disease State/ Hypertension  Recent office visits:  08-08-2021 [name], MD. Cholesterol= 222, LDL= 106. Referral placed to gastro. START cipro twice daily for 2 weeks.  Recent consult visits:  08-28-2021 [name], MD (Cardiology). Patient reported not taking metoprolol. STOP xarelto. EKG completed  Hospital visits:  None in previous 6 months  Medications: Outpatient Encounter Medications as of 10/25/2021  Medication Sig   Biotin 1000 MCG tablet Take 1,000 mcg by mouth daily.   blood glucose meter kit and supplies KIT Check sugars daily as needed. Hypoglycemia e16.2   calcium-vitamin D (OSCAL WITH D) 500-200 MG-UNIT per tablet Take 1 tablet by mouth daily.   cholecalciferol (VITAMIN D3) 25 MCG (1000 UNIT) tablet Take 1,000 Units by mouth 3 (three) times a week.   estradiol (ESTRACE) 1 MG tablet TAKE 1 TABLET EVERY DAY   ezetimibe (ZETIA) 10 MG tablet TAKE 1 TABLET (10 MG TOTAL) BY MOUTH DAILY.   fish oil-omega-3 fatty acids 1000 MG capsule Take 1 g by mouth in the morning and at bedtime.   gabapentin (NEURONTIN) 300 MG capsule Take 2 capsules (600 mg total) by mouth 3 (three) times daily.   Glucosamine-Chondroit-Vit C-Mn (GLUCOSAMINE 1500 COMPLEX PO) Take 2 tablets by mouth at bedtime.   glucose blood (TRUE METRIX BLOOD GLUCOSE TEST) test strip Use as instructed (Patient taking differently: 1 each 3 (three) times daily after meals, bedtime and 0200. Use as instructed)   TRUEplus Lancets 33G MISC 1 each by Does not apply route 2 (two) times daily.   ustekinumab (STELARA) 90 MG/ML SOSY injection Inject 90 mg into the skin every 8 (eight) weeks.   zolpidem (AMBIEN) 5 MG tablet TAKE 1 TABLET BY MOUTH EVERY DAY AT BEDTIME AS NEEDED FOR SLEEP   No facility-administered encounter medications on file as of 10/25/2021.   Recent Office Vitals: BP  Readings from Last 3 Encounters:  08/28/21 134/82  08/08/21 120/64  07/03/21 124/63   Pulse Readings from Last 3 Encounters:  08/28/21 (!) 55  08/08/21 84  07/03/21 (!) 52    Wt Readings from Last 3 Encounters:  08/28/21 179 lb (81.2 kg)  08/08/21 177 lb (80.3 kg)  07/03/21 180 lb (81.6 kg)     Kidney Function Lab Results  Component Value Date/Time   CREATININE 0.68 08/08/2021 08:53 AM   CREATININE 0.80 07/03/2021 08:12 AM   GFRNONAA >60 04/01/2021 10:14 AM   GFRAA 114 06/06/2020 09:57 AM       Latest Ref Rng & Units 08/08/2021    8:53 AM 07/03/2021    8:12 AM 04/10/2021    8:32 AM  BMP  Glucose 70 - 99 mg/dL 89  98  93   BUN 6 - 24 mg/dL _0 Creatinine 0.57 - 1.00 mg/dL 0.68  0.80  0.68   BUN/Creat Ratio 9 - _1 Sodium 134 - 144 mmol/L 141  139  141   Potassium 3.5 - 5.2 mmol/L 4.3  3.6  4.6   Chloride 96 - 106 mmol/L 103  102  104   CO2 20 - 29 mmol/L 26   26   Calcium 8.7 - 10.2 mg/dL 9.8   9.5     10-25-2021: Contacted patient for hypertension assessment and patient stated she doesn't feel the need to continue with CCM services. Moved patient  to [facility] and updated team. Informed patient to reach out to me for any needs in the future.  Care Gaps: Last annual wellness visit? 06-06-2020  Star Rating Drugs: None  [place] Clinical Pharmacist Assistant [phone]

## 2021-10-25 NOTE — Telephone Encounter (Signed)
° °  Pt is calling back to f/u °

## 2021-10-25 NOTE — Telephone Encounter (Signed)
Pt reports that she has NOT stopped Xarelto as was dicussed in April. Says she is very nervous to consider doing this, and especially right now as she is moving and stressed.   She is fearful of stroke. She does report she is not having afib that she knows of and her watch hasn't told her she is having any. She would like to continue until she follows up in October w/ MD and then go from there.  Aware I will forward to MD and recommendation if ok to continue.  Will send in Rx if MD approves. Patient verbalized understanding and agreeable to plan.

## 2021-10-26 NOTE — Telephone Encounter (Signed)
[name], RN to [name], RN      10/25/21  6:03 PM Hey spoke with her today.  She is going to remain on Xarelto until she sees Brittany [name] in October   :)  [name]  October 24, 2021 [name], RN to [name], RN      10/24/21 11:21 AM Brittany [name] looks like Dr Brittany [name] d/c this pt's Xarelto at last OV 08/28/21, but pt has continued to take.  She is calling wanting to speak to the nurse because she is scared to d/c.  Forwarded to [place] as a refill, but message states she wants to speak to Dr Brittany [name] nurse. Thanks     Xarelto '20mg'$  refill request received. Pt is 56 years old, weight-81.2kg, Crea-0.68 on 08/08/2021, last seen by Dr. [name] on 08/28/2021, Diagnosis-Afib, CrCl-115.73m/min; Dose is appropriate based on dosing criteria. Will send in refill to requested pharmacy. Per above message pt will continue Xarelto until appt with Dr. [name] October. Refill sent at this time

## 2021-11-20 ENCOUNTER — Encounter: Payer: Self-pay | Admitting: Family Medicine

## 2021-11-20 ENCOUNTER — Ambulatory Visit (INDEPENDENT_AMBULATORY_CARE_PROVIDER_SITE_OTHER): Payer: Medicare HMO | Admitting: Family Medicine

## 2021-11-20 VITALS — BP 124/80 | HR 51 | Temp 96.6°F | Ht 65.5 in | Wt 181.0 lb

## 2021-11-20 DIAGNOSIS — L650 Telogen effluvium: Secondary | ICD-10-CM

## 2021-11-20 DIAGNOSIS — E1169 Type 2 diabetes mellitus with other specified complication: Secondary | ICD-10-CM | POA: Diagnosis not present

## 2021-11-20 DIAGNOSIS — R7303 Prediabetes: Secondary | ICD-10-CM | POA: Diagnosis not present

## 2021-11-20 DIAGNOSIS — E538 Deficiency of other specified B group vitamins: Secondary | ICD-10-CM

## 2021-11-20 DIAGNOSIS — E785 Hyperlipidemia, unspecified: Secondary | ICD-10-CM | POA: Diagnosis not present

## 2021-11-20 DIAGNOSIS — K5080 Crohn's disease of both small and large intestine without complications: Secondary | ICD-10-CM

## 2021-11-20 DIAGNOSIS — F5101 Primary insomnia: Secondary | ICD-10-CM

## 2021-11-20 DIAGNOSIS — M791 Myalgia, unspecified site: Secondary | ICD-10-CM | POA: Diagnosis not present

## 2021-11-20 DIAGNOSIS — I10 Essential (primary) hypertension: Secondary | ICD-10-CM

## 2021-11-20 DIAGNOSIS — I4819 Other persistent atrial fibrillation: Secondary | ICD-10-CM | POA: Diagnosis not present

## 2021-11-20 DIAGNOSIS — T466X5A Adverse effect of antihyperlipidemic and antiarteriosclerotic drugs, initial encounter: Secondary | ICD-10-CM

## 2021-11-20 DIAGNOSIS — E11649 Type 2 diabetes mellitus with hypoglycemia without coma: Secondary | ICD-10-CM

## 2021-11-20 DIAGNOSIS — E782 Mixed hyperlipidemia: Secondary | ICD-10-CM

## 2021-11-20 DIAGNOSIS — G609 Hereditary and idiopathic neuropathy, unspecified: Secondary | ICD-10-CM | POA: Diagnosis not present

## 2021-11-20 MED ORDER — GABAPENTIN 300 MG PO CAPS: 600.0000 mg | ORAL_CAPSULE | Freq: Two times a day (BID) | ORAL | 1 refills | Status: DC

## 2021-11-20 NOTE — Patient Instructions (Signed)
Consider finasteride.

## 2021-11-20 NOTE — Progress Notes (Unsigned)
Subjective:  Patient ID: Brittany [name], female    DOB: [birth year]  Age: 56 y.o. MRN: [id number]  Chief Complaint  Patient presents with   Hyperlipidemia   Hypertension   Diabetes    Hyperlipidemia Pertinent negatives include no chest pain, myalgias or shortness of breath.  Hypertension Pertinent negatives include no chest pain, headaches or shortness of breath.   HPI: Prediabetes: eats healthy. Exercising. Checking sugars sporadically. Sugars 90s Hypoglycemia: 3 times a week at night.  Skips meals. Crohn's disease: Not having diarrhea. ON stelara 90 mg injection every 8 weeks. Pt Is due to see Gi for colonoscopy.   Mixed hyperlipidemia: Zetia and fish oil.    Neuropathy: on gabapentin 300 mg two at night.    Atrial fibrillation: on xarelto 20 mg once daily. ON no rate control medications. Has toprol xl 25 mg 1/2 pill once daily as needed.  Insomnia: ambien 5 mg once daily at night. Patient sleeps hard, but talks in her sleep. Has tried to wean off, but awakens in the middle of the night.      Current Outpatient Medications on File Prior to Visit  Medication Sig Dispense Refill   Biotin 1000 MCG tablet Take 1,000 mcg by mouth daily.     blood glucose meter kit and supplies KIT Check sugars daily as needed. Hypoglycemia e16.2 1 each 0   calcium-vitamin D (OSCAL WITH D) 500-200 MG-UNIT per tablet Take 1 tablet by mouth daily.     cholecalciferol (VITAMIN D3) 25 MCG (1000 UNIT) tablet Take 1,000 Units by mouth 3 (three) times a week.     estradiol (ESTRACE) 1 MG tablet TAKE 1 TABLET EVERY DAY 90 tablet 0   ezetimibe (ZETIA) 10 MG tablet TAKE 1 TABLET (10 MG TOTAL) BY MOUTH DAILY. 90 tablet 1   fish oil-omega-3 fatty acids 1000 MG capsule Take 1 g by mouth in the morning and at bedtime.     gabapentin (NEURONTIN) 300 MG capsule Take 2 capsules (600 mg total) by mouth 3 (three) times daily. 360 capsule 1   Glucosamine-Chondroit-Vit C-Mn (GLUCOSAMINE 1500 COMPLEX PO) Take 2  tablets by mouth at bedtime.     glucose blood (TRUE METRIX BLOOD GLUCOSE TEST) test strip Use as instructed (Patient taking differently: 1 each 3 (three) times daily after meals, bedtime and 0200. Use as instructed) 100 each 12   rivaroxaban (XARELTO) 20 MG TABS tablet TAKE 1 TABLET BY MOUTH ONCE DAILY WITH SUPPER 90 tablet 1   TRUEplus Lancets 33G MISC 1 each by Does not apply route 2 (two) times daily. 100 each 5   ustekinumab (STELARA) 90 MG/ML SOSY injection Inject 90 mg into the skin every 8 (eight) weeks.     zolpidem (AMBIEN) 5 MG tablet TAKE 1 TABLET BY MOUTH EVERY DAY AT BEDTIME AS NEEDED FOR SLEEP 90 tablet 0   No current facility-administered medications on file prior to visit.   Past Medical History:  Diagnosis Date   BMI 26.0-26.9,adult 07/06/2019   Bradycardia 08/10/2014   Crohn's disease (HCC)    DDD (degenerative disc disease), lumbar 07/17/2017   Diabetes mellitus without complication (HCC)    Fatigue 08/10/2014   Hyperlipidemia    Hyperlipidemia associated with type 2 diabetes mellitus (HCC) 07/06/2019   Hypertension    Hypertension, essential    Mixed hyperlipidemia    Palpitations 07/06/2019   Paresthesia 07/06/2019   Past Surgical History:  Procedure Laterality Date   ABDOMINAL HYSTERECTOMY N/A 10/20/2012   Procedure: HYSTERECTOMY ABDOMINAL;  Surgeon: Miguel Aschoff, MD;  Location: [facility];  Service: Gynecology;  Laterality: N/A;   ANAL FISSURE REPAIR  2002   APPENDECTOMY     CARDIOVERSION N/A 07/03/2021   Procedure: CARDIOVERSION;  Surgeon: Little Ishikawa, MD;  Location: [facility]COPY;  Service: Cardiovascular;  Laterality: N/A;   SALPINGOOPHORECTOMY Bilateral 10/20/2012   Procedure: SALPINGO OOPHORECTOMY;  Surgeon: Miguel Aschoff, MD;  Location: [facility];  Service: Gynecology;  Laterality: Bilateral;   SMALL INTESTINE SURGERY     reconstruction   TONSILLECTOMY      Family History  Problem Relation Age of Onset   Heart Problems Mother    Diabetes Mother    Diabetes  Father    Heart Problems Father    Cancer Maternal Grandmother        lung   Peptic Ulcer Disease Other    Social History   Socioeconomic History   Marital status: Widowed    Spouse name: Not on file   Number of children: 1   Years of education: Not on file   Highest education level: Not on file  Occupational History   Not on file  Tobacco Use   Smoking status: Former    Types: Cigarettes    Quit date: 2002    Years since quitting: 21.5   Smokeless tobacco: Never  Vaping Use   Vaping Use: Never used  Substance and Sexual Activity   Alcohol use: Yes   Drug use: No   Sexual activity: Not on file  Other Topics Concern   Not on file  Social History Narrative   wears sunscreen, brushes and flosses daily, see's dentist bi-annually, has smoke/carbon monoxide detectors, wears a seatbelt and practices gun safety   Social Determinants of Health   Financial Resource Strain: Low Risk  (07/06/2021)   Overall Financial Resource Strain (CARDIA)    Difficulty of Paying Living Expenses: Not hard at all  Food Insecurity: No Food Insecurity (11/20/2021)   Hunger Vital Sign    Worried About Running Out of Food in the Last Year: Never true    Ran Out of Food in the Last Year: Never true  Transportation Needs: No Transportation Needs (07/06/2021)   PRAPARE - Administrator, Civil Service (Medical): No    Lack of Transportation (Non-Medical): No  Physical Activity: Inactive (11/20/2021)   Exercise Vital Sign    Days of Exercise per Week: 0 days    Minutes of Exercise per Session: 0 min  Stress: No Stress Concern Present (11/20/2021)   [employer] of Occupational Health - Occupational Stress Questionnaire    Feeling of Stress : Not at all  Social Connections: Moderately Integrated (11/20/2021)   Social Connection and Isolation Panel [NHANES]    Frequency of Communication with Friends and Family: More than three times a week    Frequency of Social Gatherings with Friends and  Family: More than three times a week    Attends Religious Services: More than 4 times per year    Active Member of [employer] or Organizations: No    Attends Banker Meetings: Never    Marital Status: Married    Review of Systems  Constitutional:  Negative for chills, fatigue and fever.  HENT:  Negative for congestion, ear pain, rhinorrhea and sore throat.   Respiratory:  Negative for cough and shortness of breath.   Cardiovascular:  Negative for chest pain.  Gastrointestinal:  Negative for abdominal pain, constipation, diarrhea (Crohn's), nausea and vomiting.  Genitourinary:  Negative for dysuria and urgency.  Musculoskeletal:  Positive for arthralgias. Negative for back pain and myalgias.  Neurological:  Negative for dizziness, weakness, light-headedness and headaches.  Psychiatric/Behavioral:  Negative for dysphoric mood. The patient is not nervous/anxious.      Objective:  BP 124/80   Pulse (!) 51   Temp (!) 96.6 F (35.9 C)   Ht 5' 5.5" (1.664 m)   Wt 181 lb (82.1 kg)   LMP 09/23/2012   SpO2 99%   BMI 29.66 kg/m      11/20/2021    7:51 AM 08/28/2021   11:44 AM 08/08/2021    7:59 AM  BP/Weight  Systolic BP 750 518 335  Diastolic BP 80 82 64  Wt. (Lbs) 181 179 177  BMI 29.66 kg/m2 29.79 kg/m2 29.45 kg/m2    Physical Exam  Diabetic Foot Exam - Simple   No data filed      Lab Results  Component Value Date   WBC 6.1 08/08/2021   HGB 14.9 08/08/2021   HCT 44.5 08/08/2021   PLT 216 08/08/2021   GLUCOSE 89 08/08/2021   CHOL 222 (H) 08/08/2021   TRIG 114 08/08/2021   HDL 97 08/08/2021   LDLCALC 106 (H) 08/08/2021   ALT 10 08/08/2021   AST 18 08/08/2021   NA 141 08/08/2021   K 4.3 08/08/2021   CL 103 08/08/2021   CREATININE 0.68 08/08/2021   BUN 12 08/08/2021   CO2 26 08/08/2021   TSH 1.892 04/01/2021   INR 0.86 10/20/2012   HGBA1C 5.5 08/08/2021   MICROALBUR 30 08/25/2020      Assessment & Plan:   Problem List Items Addressed This Visit        Cardiovascular and Mediastinum   Hypertension, essential     Endocrine   Hyperlipidemia associated with type 2 diabetes mellitus ([place])     Nervous and Auditory   Idiopathic neuropathy     Musculoskeletal and Integument   Telogen effluvium     Other   Crohn's disease ([place]) - Primary   Prediabetes   B12 deficiency  . Total time spent on today's visit was greater than 30 minutes, including both face-to-face time and nonface-to-face time personally spent on review of chart (labs and imaging), discussing labs and goals, discussing further work-up, treatment options, referrals to specialist if needed, reviewing outside records of pertinent, answering patient's questions, and coordinating care.  Orders Placed This Encounter  Procedures   HM MAMMOGRAPHY   HM DIABETES EYE EXAM     Follow-up: Return in about 3 months (around 02/20/2022) for chronic fasting.  An After Visit Summary was printed and given to the patient.  [name], MD [facility] [phone]

## 2021-11-21 DIAGNOSIS — F5101 Primary insomnia: Secondary | ICD-10-CM | POA: Insufficient documentation

## 2021-11-21 MED ORDER — FINASTERIDE 1 MG PO TABS: 1.0000 mg | ORAL_TABLET | Freq: Every day | ORAL | 0 refills | Status: DC

## 2021-11-21 NOTE — Assessment & Plan Note (Signed)
Recommend continue to work on eating healthy diet and exercise.  

## 2021-11-21 NOTE — Assessment & Plan Note (Signed)
Management per specialist. Refer to alternative GI as hers is retiring. Continue stelara.

## 2021-11-21 NOTE — Assessment & Plan Note (Signed)
Continue B12 shots monthly.

## 2021-11-21 NOTE — Assessment & Plan Note (Signed)
Well controlled.  No changes to medicines. Continue Zetia and fish oil.  Continue to work on eating a healthy diet and exercise.  Labs drawn today.

## 2021-11-21 NOTE — Assessment & Plan Note (Signed)
Intolerant to statins. 

## 2021-11-21 NOTE — Assessment & Plan Note (Signed)
Increase gabapentin 300 mg one in am and 2 at night.

## 2021-11-21 NOTE — Assessment & Plan Note (Signed)
Well controlled.with a healthy diet and exercise.  Labs drawn today.

## 2021-11-21 NOTE — Assessment & Plan Note (Signed)
Continue ambien 5 mg before bed.

## 2021-11-21 NOTE — Assessment & Plan Note (Signed)
Start finasteride 1 mg once daily.

## 2021-11-21 NOTE — Assessment & Plan Note (Signed)
Continue xarelto 20 mg daily. 

## 2021-11-22 DIAGNOSIS — K5080 Crohn's disease of both small and large intestine without complications: Principal | ICD-10-CM

## 2021-11-22 LAB — COMPREHENSIVE METABOLIC PANEL
ALT: 18 IU/L (ref 0–32)
AST: 22 IU/L (ref 0–40)
Albumin/Globulin Ratio: 1.8 (ref 1.2–2.2)
Albumin: 4.6 g/dL (ref 3.8–4.9)
Alkaline Phosphatase: 63 IU/L (ref 44–121)
BUN/Creatinine Ratio: 24 — ABNORMAL HIGH (ref 9–23)
BUN: 17 mg/dL (ref 6–24)
Bilirubin Total: 0.6 mg/dL (ref 0.0–1.2)
CO2: 23 mmol/L (ref 20–29)
Calcium: 9.5 mg/dL (ref 8.7–10.2)
Chloride: 102 mmol/L (ref 96–106)
Creatinine, Ser: 0.71 mg/dL (ref 0.57–1.00)
Globulin, Total: 2.5 g/dL (ref 1.5–4.5)
Glucose: 92 mg/dL (ref 70–99)
Potassium: 4.3 mmol/L (ref 3.5–5.2)
Sodium: 141 mmol/L (ref 134–144)
Total Protein: 7.1 g/dL (ref 6.0–8.5)
eGFR: 98 mL/min/{1.73_m2} (ref >59)

## 2021-11-22 LAB — LIPID PANEL
Chol/HDL Ratio: 2.8 ratio (ref 0.0–4.4)
Cholesterol, Total: 234 mg/dL — ABNORMAL HIGH (ref 100–199)
HDL: 84 mg/dL (ref >39)
LDL Chol Calc (NIH): 128 mg/dL — ABNORMAL HIGH (ref 0–99)
Triglycerides: 127 mg/dL (ref 0–149)
VLDL Cholesterol Cal: 22 mg/dL (ref 5–40)

## 2021-11-22 LAB — CBC WITH DIFFERENTIAL/PLATELET
Basophils Absolute: 0.1 10*3/uL (ref 0.0–0.2)
Basos: 1 %
EOS (ABSOLUTE): 0.1 10*3/uL (ref 0.0–0.4)
Eos: 1 %
Hematocrit: 44.2 % (ref 34.0–46.6)
Hemoglobin: 14.6 g/dL (ref 11.1–15.9)
Immature Grans (Abs): 0 10*3/uL (ref 0.0–0.1)
Immature Granulocytes: 0 %
Lymphocytes Absolute: 1.8 10*3/uL (ref 0.7–3.1)
Lymphs: 32 %
MCH: 29.6 pg (ref 26.6–33.0)
MCHC: 33 g/dL (ref 31.5–35.7)
MCV: 90 fL (ref 79–97)
Monocytes Absolute: 0.4 10*3/uL (ref 0.1–0.9)
Monocytes: 7 %
Neutrophils Absolute: 3.3 10*3/uL (ref 1.4–7.0)
Neutrophils: 59 %
Platelets: 218 10*3/uL (ref 150–450)
RBC: 4.94 x10E6/uL (ref 3.77–5.28)
RDW: 13.1 % (ref 11.7–15.4)
WBC: 5.6 10*3/uL (ref 3.4–10.8)

## 2021-11-22 LAB — FERRITIN: Ferritin: 152 ng/mL — ABNORMAL HIGH (ref 15–150)

## 2021-11-22 LAB — METHYLMALONIC ACID, SERUM: Methylmalonic Acid: 130 nmol/L (ref 0–378)

## 2021-11-22 LAB — HEMOGLOBIN A1C
Est. average glucose Bld gHb Est-mCnc: 108 mg/dL
Hgb A1c MFr Bld: 5.4 % (ref 4.8–5.6)

## 2021-11-22 LAB — B12 AND FOLATE PANEL
Folate: >20 ng/mL (ref >3.0)
Vitamin B-12: 499 pg/mL (ref 232–1245)

## 2021-11-22 LAB — MICROALBUMIN / CREATININE URINE RATIO
Creatinine, Urine: 174.2 mg/dL
Microalb/Creat Ratio: 3 mg/g creat (ref 0–29)
Microalbumin, Urine: 5.1 ug/mL

## 2021-11-22 LAB — CARDIOVASCULAR RISK ASSESSMENT

## 2021-11-22 NOTE — Progress Notes (Signed)
Add tsh.  Blood count normal.  Liver function normal.  Kidney function normal.  Cholesterol: LDL up from 106 to 128. Marland KitchenRecommend continue zetia 10 mg daily and fish oil. Low fat diet and exercise recommended. HBA1C: 5.4 Not spilling protein in urine.  B12, folate, mma normal. No cause of hair loss found. Start finasteride 1 mg once daily. I sent to mail order.

## 2021-11-26 ENCOUNTER — Telehealth: Payer: Self-pay

## 2021-11-26 ENCOUNTER — Other Ambulatory Visit: Payer: Self-pay

## 2021-11-26 DIAGNOSIS — F5101 Primary insomnia: Secondary | ICD-10-CM

## 2021-11-26 DIAGNOSIS — G609 Hereditary and idiopathic neuropathy, unspecified: Secondary | ICD-10-CM

## 2021-11-26 MED ORDER — ZOLPIDEM TARTRATE 5 MG PO TABS: ORAL_TABLET | ORAL | 0 refills | Status: DC

## 2021-11-26 MED ORDER — GABAPENTIN 300 MG PO CAPS: 600.0000 mg | ORAL_CAPSULE | Freq: Every day | ORAL | 1 refills | Status: DC

## 2021-11-26 NOTE — Telephone Encounter (Signed)
Brittany [name] called requesting a referral to Dr. Darrel Hoover @ [facility].  Her current GI physician has retired and she wants to see Dr. [name] please.

## 2022-01-07 ENCOUNTER — Telehealth: Payer: Medicare HMO

## 2022-01-08 ENCOUNTER — Telehealth: Payer: Medicare HMO

## 2022-01-15 ENCOUNTER — Encounter: Payer: Self-pay | Admitting: Family Medicine

## 2022-01-15 ENCOUNTER — Ambulatory Visit (INDEPENDENT_AMBULATORY_CARE_PROVIDER_SITE_OTHER): Payer: Medicare HMO | Admitting: Family Medicine

## 2022-01-15 VITALS — BP 120/64 | HR 78 | Temp 97.3°F | Resp 16 | Ht 65.5 in | Wt 178.0 lb

## 2022-01-15 DIAGNOSIS — J018 Other acute sinusitis: Secondary | ICD-10-CM

## 2022-01-15 MED ORDER — BENZONATATE 200 MG PO CAPS: 200.0000 mg | ORAL_CAPSULE | Freq: Three times a day (TID) | ORAL | 0 refills | Status: DC | PRN

## 2022-01-15 MED ORDER — AZITHROMYCIN 250 MG PO TABS: ORAL_TABLET | ORAL | 0 refills | Status: DC

## 2022-01-15 NOTE — Progress Notes (Signed)
Acute Office Visit  Subjective:    Patient ID: Brittany [name], female    DOB: 05/29/1965, 55 y.o.   MRN: [id number]  Chief Complaint  Patient presents with   facial pain and pressure   Cough    HPI: Patient is in today for facial pain and pressure.  Symptoms started on Friday.  No fever or chills reports.  Home covid test on Sunday was negative. +  Past Medical History:  Diagnosis Date   BMI 26.0-26.9,adult 07/06/2019   Bradycardia 08/10/2014   Crohn's disease ([place])    DDD (degenerative disc disease), lumbar 07/17/2017   Diabetes mellitus without complication ([place])    Fatigue 08/10/2014   Hyperlipidemia    Hyperlipidemia associated with type 2 diabetes mellitus ([place]) 07/06/2019   Hypertension    Hypertension, essential    Mixed hyperlipidemia    Palpitations 07/06/2019   Paresthesia 07/06/2019    Past Surgical History:  Procedure Laterality Date   ABDOMINAL HYSTERECTOMY N/A 10/20/2012   Procedure: HYSTERECTOMY ABDOMINAL;  Surgeon: [name], MD;  Location: [place];  Service: Gynecology;  Laterality: N/A;   ANAL FISSURE REPAIR  2002   APPENDECTOMY     CARDIOVERSION N/A 07/03/2021   Procedure: CARDIOVERSION;  Surgeon: Donato Heinz, MD;  Location: [facility];  Service: Cardiovascular;  Laterality: N/A;   SALPINGOOPHORECTOMY Bilateral 10/20/2012   Procedure: SALPINGO OOPHORECTOMY;  Surgeon: [name], MD;  Location: [facility];  Service: Gynecology;  Laterality: Bilateral;   SMALL INTESTINE SURGERY     reconstruction   TONSILLECTOMY      Family History  Problem Relation Age of Onset   Heart Problems Mother    Diabetes Mother    Diabetes Father    Heart Problems Father    Cancer Maternal Grandmother        lung   Peptic Ulcer Disease Other     Social History   Socioeconomic History   Marital status: Widowed    Spouse name: Not on file   Number of children: 1   Years of education: Not on file   Highest education level: Not on file  Occupational  History   Not on file  Tobacco Use   Smoking status: Former    Types: Cigarettes    Quit date: 2002    Years since quitting: 21.6   Smokeless tobacco: Never  Vaping Use   Vaping Use: Never used  Substance and Sexual Activity   Alcohol use: Yes   Drug use: No   Sexual activity: Not on file  Other Topics Concern   Not on file  Social History Narrative   wears sunscreen, brushes and flosses daily, see's dentist bi-annually, has smoke/carbon monoxide detectors, wears a seatbelt and practices gun safety   Social Determinants of Health   Financial Resource Strain: Low Risk  (07/06/2021)   Overall Financial Resource Strain (CARDIA)    Difficulty of Paying Living Expenses: Not hard at all  Food Insecurity: No Food Insecurity (11/20/2021)   Hunger Vital Sign    Worried About Running Out of Food in the Last Year: Never true    [place] in the Last Year: Never true  Transportation Needs: No Transportation Needs (07/06/2021)   PRAPARE - Hydrologist (Medical): No    Lack of Transportation (Non-Medical): No  Physical Activity: Inactive (11/20/2021)   Exercise Vital Sign    Days of Exercise per Week: 0 days    Minutes of Exercise per Session:  0 min  Stress: No Stress Concern Present (11/20/2021)   [place]    Feeling of Stress : Not at all  Social Connections: Moderately Integrated (11/20/2021)   Social Connection and Isolation Panel [NHANES]    Frequency of Communication with Friends and Family: More than three times a week    Frequency of Social Gatherings with Friends and Family: More than three times a week    Attends Religious Services: More than 4 times per year    Active Member of [employer] or Organizations: No    Attends Archivist Meetings: Never    Marital Status: Married  Human resources officer Violence: Not At Risk (11/20/2021)   Humiliation, Afraid, Rape, and Kick  questionnaire    Fear of Current or Ex-Partner: No    Emotionally Abused: No    Physically Abused: No    Sexually Abused: No    Outpatient Medications Prior to Visit  Medication Sig Dispense Refill   Biotin 1000 MCG tablet Take 1,000 mcg by mouth daily.     blood glucose meter kit and supplies KIT Check sugars daily as needed. Hypoglycemia e16.2 1 each 0   calcium-vitamin D (OSCAL WITH D) 500-200 MG-UNIT per tablet Take 1 tablet by mouth daily.     cholecalciferol (VITAMIN D3) 25 MCG (1000 UNIT) tablet Take 1,000 Units by mouth 3 (three) times a week.     estradiol (ESTRACE) 1 MG tablet TAKE 1 TABLET EVERY DAY 90 tablet 0   ezetimibe (ZETIA) 10 MG tablet TAKE 1 TABLET (10 MG TOTAL) BY MOUTH DAILY. 90 tablet 1   finasteride (PROPECIA) 1 MG tablet Take 1 tablet (1 mg total) by mouth daily. 90 tablet 0   fish oil-omega-3 fatty acids 1000 MG capsule Take 1 g by mouth in the morning and at bedtime.     gabapentin (NEURONTIN) 300 MG capsule Take 2 capsules (600 mg total) by mouth at bedtime. 180 capsule 1   Glucosamine-Chondroit-Vit C-Mn (GLUCOSAMINE 1500 COMPLEX PO) Take 2 tablets by mouth at bedtime.     glucose blood (TRUE METRIX BLOOD GLUCOSE TEST) test strip Use as instructed (Patient taking differently: 1 each 3 (three) times daily after meals, bedtime and 0200. Use as instructed) 100 each 12   rivaroxaban (XARELTO) 20 MG TABS tablet TAKE 1 TABLET BY MOUTH ONCE DAILY WITH SUPPER 90 tablet 1   TRUEplus Lancets 33G MISC 1 each by Does not apply route 2 (two) times daily. 100 each 5   ustekinumab (STELARA) 90 MG/ML SOSY injection Inject 90 mg into the skin every 8 (eight) weeks.     zolpidem (AMBIEN) 5 MG tablet TAKE 1 TABLET BY MOUTH EVERY DAY AT BEDTIME AS NEEDED FOR SLEEP 90 tablet 0   No facility-administered medications prior to visit.    Allergies  Allergen Reactions   Mercaptopurine     Used to treat pt. Crohn's Disease.  Caused Pancreatis 1992.   Amitriptyline     No emotions     Crestor [Rosuvastatin Calcium]     Muscle pain   Metoprolol Other (See Comments)    Extreme fatigue    Trazodone And Nefazodone     Did not work for pt    Zocor [Simvastatin]     Cramps in legs   Morphine And Related Itching   Penicillins Rash    Review of Systems  Constitutional:  Negative for fatigue.  HENT:  Positive for congestion and postnasal drip. Negative for rhinorrhea  and sore throat.        Facial pain and pressure, teeth hurting   Respiratory:  Positive for cough. Negative for shortness of breath.   Cardiovascular:  Negative for chest pain.  Gastrointestinal:  Negative for abdominal pain, constipation, diarrhea, nausea and vomiting.  Genitourinary:  Negative for dysuria and urgency.  Musculoskeletal:  Negative for back pain and myalgias.  Neurological:  Positive for headaches. Negative for dizziness, weakness and light-headedness.  Psychiatric/Behavioral:  Negative for dysphoric mood. The patient is not nervous/anxious.        Objective:    Physical Exam Vitals reviewed.  Constitutional:      Appearance: Normal appearance.  HENT:     Right Ear: Tympanic membrane, ear canal and external ear normal.     Left Ear: Tympanic membrane, ear canal and external ear normal.     Nose: Congestion present.     Comments: Sinus tenderness     Mouth/Throat:     Pharynx: Oropharynx is clear.  Cardiovascular:     Rate and Rhythm: Normal rate and regular rhythm.     Heart sounds: Normal heart sounds. No murmur heard. Pulmonary:     Effort: Pulmonary effort is normal. No respiratory distress.     Breath sounds: Normal breath sounds.  Lymphadenopathy:     Cervical: No cervical adenopathy.  Neurological:     Mental Status: She is alert and oriented to person, place, and time.  Psychiatric:        Mood and Affect: Mood normal.        Behavior: Behavior normal.     BP 120/64   Pulse 78   Temp (!) 97.3 F (36.3 C)   Resp 16   Ht 5' 5.5" (1.664 m)   Wt 178 lb (80.7  kg)   LMP 09/23/2012   BMI 29.17 kg/m  Wt Readings from Last 3 Encounters:  01/15/22 178 lb (80.7 kg)  11/20/21 181 lb (82.1 kg)  08/28/21 179 lb (81.2 kg)    Health Maintenance Due  Topic Date Due   TETANUS/TDAP  Never done   Zoster Vaccines- Shingrix (1 of 2) Never done   COVID-19 Vaccine (3 - Pfizer risk series) 09/22/2019   INFLUENZA VACCINE  12/11/2021    There are no preventive care reminders to display for this patient.   Lab Results  Component Value Date   TSH 1.892 04/01/2021   Lab Results  Component Value Date   WBC 5.6 11/20/2021   HGB 14.6 11/20/2021   HCT 44.2 11/20/2021   MCV 90 11/20/2021   PLT 218 11/20/2021   Lab Results  Component Value Date   NA 141 11/20/2021   K 4.3 11/20/2021   CO2 23 11/20/2021   GLUCOSE 92 11/20/2021   BUN 17 11/20/2021   CREATININE 0.71 11/20/2021   BILITOT 0.6 11/20/2021   ALKPHOS 63 11/20/2021   AST 22 11/20/2021   ALT 18 11/20/2021   PROT 7.1 11/20/2021   ALBUMIN 4.6 11/20/2021   CALCIUM 9.5 11/20/2021   ANIONGAP 7 04/01/2021   EGFR 98 11/20/2021   Lab Results  Component Value Date   CHOL 234 (H) 11/20/2021   Lab Results  Component Value Date   HDL 84 11/20/2021   Lab Results  Component Value Date   LDLCALC 128 (H) 11/20/2021   Lab Results  Component Value Date   TRIG 127 11/20/2021   Lab Results  Component Value Date   CHOLHDL 2.8 11/20/2021   Lab Results  Component  Value Date   HGBA1C 5.4 11/20/2021       Assessment & Plan:   Problem List Items Addressed This Visit   None Visit Diagnoses     Acute non-recurrent sinusitis of other sinus    -  Primary   Relevant Medications   azithromycin (ZITHROMAX) 250 MG tablet   benzonatate (TESSALON) 200 MG capsule      Meds ordered this encounter  Medications   azithromycin (ZITHROMAX) 250 MG tablet    Sig: 2 DAILY FOR FIRST DAY, THEN DECREASE TO ONE DAILY FOR 4 MORE DAYS.    Dispense:  6 tablet    Refill:  0   benzonatate (TESSALON) 200  MG capsule    Sig: Take 1 capsule (200 mg total) by mouth 3 (three) times daily as needed for cough.    Dispense:  30 capsule    Refill:  0    No orders of the defined types were placed in this encounter.    Follow-up: Return if symptoms worsen or fail to improve.  An After Visit Summary was printed and given to the patient.  [name], MD [facility] [phone]

## 2022-01-30 ENCOUNTER — Other Ambulatory Visit: Payer: Self-pay

## 2022-01-30 ENCOUNTER — Emergency Department (HOSPITAL_COMMUNITY)
Admission: EM | Admit: 2022-01-30 | Discharge: 2022-01-30 | Disposition: A | Discharge status: Home / Self Care | Payer: Medicare HMO | Attending: Emergency Medicine | Admitting: Emergency Medicine

## 2022-01-30 ENCOUNTER — Telehealth: Payer: Medicare HMO

## 2022-01-30 ENCOUNTER — Telehealth: Payer: Self-pay | Admitting: Cardiology

## 2022-01-30 ENCOUNTER — Emergency Department (HOSPITAL_COMMUNITY): Payer: Medicare HMO

## 2022-01-30 DIAGNOSIS — Z79899 Other long term (current) drug therapy: Secondary | ICD-10-CM | POA: Insufficient documentation

## 2022-01-30 DIAGNOSIS — E119 Type 2 diabetes mellitus without complications: Secondary | ICD-10-CM | POA: Diagnosis not present

## 2022-01-30 DIAGNOSIS — Z7901 Long term (current) use of anticoagulants: Secondary | ICD-10-CM | POA: Diagnosis not present

## 2022-01-30 DIAGNOSIS — Z0189 Encounter for other specified special examinations: Secondary | ICD-10-CM

## 2022-01-30 DIAGNOSIS — I480 Paroxysmal atrial fibrillation: Secondary | ICD-10-CM | POA: Insufficient documentation

## 2022-01-30 DIAGNOSIS — R079 Chest pain, unspecified: Secondary | ICD-10-CM | POA: Diagnosis not present

## 2022-01-30 DIAGNOSIS — I10 Essential (primary) hypertension: Secondary | ICD-10-CM | POA: Insufficient documentation

## 2022-01-30 DIAGNOSIS — Z4502 Encounter for adjustment and management of automatic implantable cardiac defibrillator: Secondary | ICD-10-CM | POA: Diagnosis not present

## 2022-01-30 LAB — BASIC METABOLIC PANEL
Anion gap: 7 (ref 5–15)
BUN: 14 mg/dL (ref 6–20)
CO2: 29 mmol/L (ref 22–32)
Calcium: 9.5 mg/dL (ref 8.9–10.3)
Chloride: 103 mmol/L (ref 98–111)
Creatinine, Ser: 0.73 mg/dL (ref 0.44–1.00)
GFR, Estimated: >60 mL/min (ref >60)
Glucose, Bld: 89 mg/dL (ref 70–99)
Potassium: 4.3 mmol/L (ref 3.5–5.1)
Sodium: 139 mmol/L (ref 135–145)

## 2022-01-30 LAB — MAGNESIUM: Magnesium: 2 mg/dL (ref 1.7–2.4)

## 2022-01-30 LAB — CBC
HCT: 48.3 % — ABNORMAL HIGH (ref 36.0–46.0)
Hemoglobin: 15.6 g/dL — ABNORMAL HIGH (ref 12.0–15.0)
MCH: 30.1 pg (ref 26.0–34.0)
MCHC: 32.3 g/dL (ref 30.0–36.0)
MCV: 93.1 fL (ref 80.0–100.0)
Platelets: 229 10*3/uL (ref 150–400)
RBC: 5.19 MIL/uL — ABNORMAL HIGH (ref 3.87–5.11)
RDW: 13.3 % (ref 11.5–15.5)
WBC: 8.3 10*3/uL (ref 4.0–10.5)
nRBC: 0 % (ref 0.0–0.2)

## 2022-01-30 LAB — TROPONIN I (HIGH SENSITIVITY)
Troponin I (High Sensitivity): 6 ng/L (ref <18)
Troponin I (High Sensitivity): 7 ng/L (ref <18)

## 2022-01-30 LAB — TSH: TSH: 1.721 u[IU]/mL (ref 0.350–4.500)

## 2022-01-30 MED ORDER — MIDAZOLAM HCL 2 MG/2ML IJ SOLN
INTRAMUSCULAR | Status: AC | PRN
  Administered 2022-01-30: .5 mg via INTRAVENOUS

## 2022-01-30 MED ORDER — NITROGLYCERIN 0.4 MG SL SUBL: 0.4000 mg | SUBLINGUAL_TABLET | SUBLINGUAL | Status: DC | PRN

## 2022-01-30 MED ORDER — ETOMIDATE 2 MG/ML IV SOLN
10.0000 mg | Freq: Once | INTRAVENOUS | Status: DC | PRN
  Filled 2022-01-30: qty 10

## 2022-01-30 MED ORDER — MIDAZOLAM HCL 2 MG/2ML IJ SOLN
1.0000 mg | Freq: Once | INTRAMUSCULAR | Status: DC | PRN
  Filled 2022-01-30: qty 2

## 2022-01-30 MED ORDER — ETOMIDATE 2 MG/ML IV SOLN
INTRAVENOUS | Status: AC | PRN
  Administered 2022-01-30: 10 mg via INTRAVENOUS

## 2022-01-30 NOTE — ED Provider Notes (Signed)
[facility] EMERGENCY DEPARTMENT Provider Note   CSN: [id number] Arrival date & time: 01/30/22  1708     History  Chief Complaint  Patient presents with   Chest Pain    Brittany [name] is a 56 y.o. female.  Pt is a 56 yo female with a pmhx significant for HTN, Crohn's disease, DM2, HLD, and afib on Xarelto.  Pt was cardioverted out of her afib in February of this year.  She has been in NSR since then.  She has continued to take the Xarelto and denies missing any doses.  Pt felt some palpitations today.  She wears an Apple watch and it told her that she was in afib with HRs into the 160s.  Pt thought it may be anxiety because she has been under a lot of stress.  She just put her house up for sale today and has been cleaning out all of her stuff.  She still feels like she is in afib, so she came to the ED.       Home Medications Prior to Admission medications   Medication Sig Start Date End Date Taking? Authorizing Provider  azithromycin (ZITHROMAX) 250 MG tablet 2 DAILY FOR FIRST DAY, THEN DECREASE TO ONE DAILY FOR 4 MORE DAYS. 01/15/22   [name], [name], MD  benzonatate (TESSALON) 200 MG capsule Take 1 capsule (200 mg total) by mouth 3 (three) times daily as needed for cough. 01/15/22   CoxElnita Maxwell, MD  Biotin 1000 MCG tablet Take 1,000 mcg by mouth daily.    [provider]  blood glucose meter kit and supplies KIT Check sugars daily as needed. Hypoglycemia e16.2 06/21/21   [name], [name], MD  calcium-vitamin D (OSCAL WITH D) 500-200 MG-UNIT per tablet Take 1 tablet by mouth daily.    [provider]  cholecalciferol (VITAMIN D3) 25 MCG (1000 UNIT) tablet Take 1,000 Units by mouth 3 (three) times a week.    [provider]  estradiol (ESTRACE) 1 MG tablet TAKE 1 TABLET EVERY DAY 01/29/21   Marge Duncans, PA-C  ezetimibe (ZETIA) 10 MG tablet TAKE 1 TABLET (10 MG TOTAL) BY MOUTH DAILY. 08/28/21   Marge Duncans, PA-C  finasteride (PROPECIA) 1 MG tablet  Take 1 tablet (1 mg total) by mouth daily. 11/21/21   CoxElnita Maxwell, MD  fish oil-omega-3 fatty acids 1000 MG capsule Take 1 g by mouth in the morning and at bedtime.    [provider]  gabapentin (NEURONTIN) 300 MG capsule Take 2 capsules (600 mg total) by mouth at bedtime. 11/26/21   [name], [name], MD  Glucosamine-Chondroit-Vit C-Mn (GLUCOSAMINE 1500 COMPLEX PO) Take 2 tablets by mouth at bedtime.    [provider]  glucose blood (TRUE METRIX BLOOD GLUCOSE TEST) test strip Use as instructed Patient taking differently: 1 each 3 (three) times daily after meals, bedtime and 0200. Use as instructed 06/21/21   [name], [name], MD  rivaroxaban (XARELTO) 20 MG TABS tablet TAKE 1 TABLET BY MOUTH ONCE DAILY WITH SUPPER 10/26/21   Camnitz, Ocie Doyne, MD  TRUEplus Lancets 33G MISC 1 each by Does not apply route 2 (two) times daily. 06/21/21   [name], [name], MD  ustekinumab (STELARA) 90 MG/ML SOSY injection Inject 90 mg into the skin every 8 (eight) weeks.    [provider]  zolpidem (AMBIEN) 5 MG tablet TAKE 1 TABLET BY MOUTH EVERY DAY AT BEDTIME AS NEEDED FOR SLEEP 11/26/21   [name], MD  Allergies    Mercaptopurine, Amitriptyline, Crestor [rosuvastatin calcium], Metoprolol, Trazodone and nefazodone, Zocor [simvastatin], Morphine and related, and Penicillins    Review of Systems   Review of Systems  Cardiovascular:  Positive for palpitations.  All other systems reviewed and are negative.   Physical Exam Updated Vital Signs BP (!) 168/84 (BP Location: Right Arm)   Pulse (!) 54   Temp 98 F (36.7 C) (Oral)   Resp 16   Ht _0  (1.651 m)   Wt 79.4 kg   LMP 09/23/2012   SpO2 100%   BMI 29.12 kg/m  Physical Exam Vitals and nursing note reviewed.  Constitutional:      Appearance: She is well-developed.  HENT:     Head: Normocephalic and atraumatic.  Eyes:     Extraocular Movements: Extraocular movements intact.     Pupils: Pupils are equal, round, and reactive  to light.  Cardiovascular:     Rate and Rhythm: Normal rate. Rhythm irregular.     Heart sounds: Normal heart sounds.  Pulmonary:     Effort: Pulmonary effort is normal.     Breath sounds: Normal breath sounds.  Abdominal:     General: Bowel sounds are normal.     Palpations: Abdomen is soft.  Musculoskeletal:        General: Normal range of motion.     Cervical back: Normal range of motion and neck supple.  Skin:    General: Skin is warm.     Capillary Refill: Capillary refill takes less than 2 seconds.  Neurological:     General: No focal deficit present.     Mental Status: She is alert and oriented to person, place, and time.  Psychiatric:        Mood and Affect: Mood normal.        Behavior: Behavior normal.     ED Results / Procedures / Treatments   Labs (all labs ordered are listed, but only abnormal results are displayed) Labs Reviewed  CBC - Abnormal; Notable for the following components:      Result Value   RBC 5.19 (*)    Hemoglobin 15.6 (*)    HCT 48.3 (*)    All other components within normal limits  BASIC METABOLIC PANEL  TSH  MAGNESIUM  TROPONIN I (HIGH SENSITIVITY)  TROPONIN I (HIGH SENSITIVITY)    EKG EKG Interpretation  Date/Time:  Wednesday January 30 2022 22:13:29 EDT Ventricular Rate:  57 PR Interval:  180 QRS Duration: 85 QT Interval:  414 QTC Calculation: 404 R Axis:   10 Text Interpretation: Sinus rhythm Low voltage, precordial leads Probable anteroseptal infarct, old pt now in NSR following cardioversion Confirmed by Isla Pence [phone]) on 01/30/2022 10:36:33 PM  Radiology DG Chest 2 View  Result Date: 01/30/2022 CLINICAL DATA:  Chest pain EXAM: CHEST - 2 VIEW COMPARISON:  04/01/2021 FINDINGS: Cardiac and mediastinal contours are within normal limits. No focal pulmonary opacity. No pleural effusion or pneumothorax. No acute osseous abnormality. Ring-like calcification seen on the prior exam in the anterior aspect of the abdomen on  the lateral view is again noted, grossly unchanged, and again is not distinctly visualized in the PA projection. IMPRESSION: No acute cardiopulmonary process. Electronically Signed   By: Merilyn Baba M.D.   On: 01/30/2022 18:15    Procedures .Cardioversion  Date/Time: 01/30/2022 10:28 PM  Performed by: Isla Pence, MD Authorized by: Isla Pence, MD   Consent:    Consent obtained:  Written   Consent given  by:  Patient Pre-procedure details:    Cardioversion basis:  Emergent   Rhythm:  Atrial fibrillation   Electrode placement:  Anterior-posterior Patient sedated: Yes. Refer to sedation procedure documentation for details of sedation.  Attempt one:    Cardioversion mode:  Synchronous   Waveform:  Biphasic   Shock (Joules):  200   Shock outcome:  Conversion to normal sinus rhythm Post-procedure details:    Patient status:  Awake   Patient tolerance of procedure:  Tolerated well, no immediate complications .Sedation  Date/Time: 01/30/2022 10:30 PM  Performed by: Isla Pence, MD Authorized by: Isla Pence, MD   Consent:    Consent obtained:  Written Universal protocol:    Immediately prior to procedure, a time out was called: yes     Patient identity confirmed:  Verbally with patient Indications:    Procedure performed:  Cardioversion   Procedure necessitating sedation performed by:  Physician performing sedation Pre-sedation assessment:    Time since last food or drink:  5   ASA classification: class 3 - patient with severe systemic disease     Mallampati score:  II - soft palate, uvula, fauces visible   Pre-sedation assessments completed and reviewed: airway patency, cardiovascular function, hydration status, mental status, nausea/vomiting, pain level, respiratory function and temperature   Immediate pre-procedure details:    Reassessment: Patient reassessed immediately prior to procedure   Procedure details (see MAR for exact dosages):    Preoxygenation:   Room air   Sedation:  Etomidate and midazolam   Intended level of sedation: deep   Intra-procedure monitoring:  Blood pressure monitoring, cardiac monitor, continuous capnometry, continuous pulse oximetry, frequent LOC assessments and frequent vital sign checks   Intra-procedure events: none     Total Provider sedation time (minutes):  30 Post-procedure details:    Post-sedation assessment completed:  01/30/2022 10:51 PM   Attendance: Constant attendance by certified staff until patient recovered     Recovery: Patient returned to pre-procedure baseline     Patient is stable for discharge or admission: yes     Procedure completion:  Tolerated well, no immediate complications     Medications Ordered in ED Medications  nitroGLYCERIN (NITROSTAT) SL tablet 0.4 mg (has no administration in time range)  midazolam (VERSED) injection 1 mg (has no administration in time range)  etomidate (AMIDATE) injection 10 mg (has no administration in time range)  midazolam (VERSED) injection (0.5 mg Intravenous Given 01/30/22 2209)  etomidate (AMIDATE) injection (10 mg Intravenous Given 01/30/22 2210)    ED Course/ Medical Decision Making/ A&P                           Medical Decision Making Amount and/or Complexity of Data Reviewed Labs: ordered. ECG/medicine tests: ordered.  Risk Prescription drug management.   This patient presents to the ED for concern of afib, this involves an extensive number of treatment options, and is a complaint that carries with it a high risk of complications and morbidity.  The differential diagnosis includes afib, st, svt, vt   Co morbidities that complicate the patient evaluation  HTN, Crohn's disease, DM2, HLD, and afib on Xarelto   Additional history obtained:  Additional history obtained from epic chart review External records from outside source obtained and reviewed including family   Lab Tests:  I Ordered, and personally interpreted labs.  The  pertinent results include:  bmp nl, cbc nl, trop nl; mg 2.0; tsh nl   Imaging  Studies ordered:  I ordered imaging studies including cxr  I independently visualized and interpreted imaging which showed  IMPRESSION:  No acute cardiopulmonary process.   I agree with the radiologist interpretation   Cardiac Monitoring:  The patient was maintained on a cardiac monitor.  I personally viewed and interpreted the cardiac monitored which showed an underlying rhythm of: afib initially, but now in NSR   Medicines ordered and prescription drug management:  I ordered medication including etomidate and versed  for sedation  Reevaluation of the patient after these medicines showed that the patient improved I have reviewed the patients home medicines and have made adjustments as needed    Critical Interventions:  cardioversion   Problem List / ED Course:  Paroxysmal afib:  pt has been compliant with her Xarelto.  She is in agreement to cardiovert.  Cardioversion successful.   Pt is stable for d/c.  Return if worse.  F/u with cards.   Reevaluation:  After the interventions noted above, I reevaluated the patient and found that they have :improved   Social Determinants of Health:  Lives at home   Dispostion:  After consideration of the diagnostic results and the patients response to treatment, I feel that the patent would benefit from discharge with outpatient f/u.          Final Clinical Impression(s) / ED Diagnoses Final diagnoses:  Paroxysmal atrial fibrillation ([place])  Encounter for cardioversion procedure    Rx / DC Orders ED Discharge Orders          Ordered    Amb referral to AFIB Clinic  Status:  Canceled        01/30/22 2117              Isla Pence, MD 01/30/22 2300

## 2022-01-30 NOTE — ED Notes (Signed)
Patient tolerated oral fluids prior to discharge.

## 2022-01-30 NOTE — Telephone Encounter (Signed)
Patient c/o Palpitations:  High priority if patient c/o lightheadedness, shortness of breath, or chest pain  How long have you had palpitations/irregular HR/ Afib? Are you having the symptoms now? Last night; yes  Are you currently experiencing lightheadedness, SOB or CP? Pressure around left chest/arm pit area   Do you have a history of afib (atrial fibrillation) or irregular heart rhythm? Yes   Have you checked your BP or HR? (document readings if available): no   Are you experiencing any other symptoms? Heart racing

## 2022-01-30 NOTE — ED Triage Notes (Signed)
Arrives POV to ED. CP starting last night at rest. Had one episode of sudden diaphoresis. Today complains of CP, N, and some aches under arms. Numbness in tips of fingers- just today. No weakness, no dizziness. Recent stress at home moving to new house. Hx afib- xarelto.

## 2022-01-30 NOTE — Sedation Documentation (Signed)
200j shock administered by Dr Gilford Raid, patient noted to be in sinus rhythm after shock.

## 2022-01-30 NOTE — Telephone Encounter (Signed)
Called pt to discuss. Pt reports she is in ED now. Aware will review ED notes when I return to office Friday or by next week.

## 2022-01-30 NOTE — Progress Notes (Signed)
RT at bedside for cardioversion.

## 2022-01-30 NOTE — ED Provider Triage Note (Signed)
Emergency Medicine Provider Triage Evaluation Note  Brittany [name] , a 56 y.o. female  was evaluated in triage.  Pt complains of heart palpitation. Report heart palpitation since yesterday.  Today having chest pressure, some sob, discomfort in her armpit.  Hx of afib, on xarelto.  No fever, cough, hemoptysis.  Did became sweaty earlier today  Review of Systems  Positive: As above Negative: As above  Physical Exam  BP (!) 142/90 (BP Location: Left Arm)   Pulse 76   Temp 97.8 F (36.6 C) (Oral)   Resp 16   Ht 5' 5"  (1.651 m)   Wt 79.4 kg   LMP 09/23/2012   SpO2 100%   BMI 29.12 kg/m  Gen:   Awake, no distress   Resp:  Normal effort  MSK:   Moves extremities without difficulty  Other:    Medical Decision Making  Medically screening exam initiated at 5:19 PM.  Appropriate orders placed.  Brittany [name] was informed that the remainder of the evaluation will be completed by another provider, this initial triage assessment does not replace that evaluation, and the importance of remaining in the ED until their evaluation is complete.     Domenic Moras, PA-C 01/30/22 1723

## 2022-02-05 ENCOUNTER — Ambulatory Visit (HOSPITAL_COMMUNITY)
Admission: RE | Admit: 2022-02-05 | Discharge: 2022-02-05 | Disposition: A | Discharge status: Home / Self Care | Payer: Medicare HMO | Source: Ambulatory Visit | Attending: Physician Assistant | Admitting: Physician Assistant

## 2022-02-05 ENCOUNTER — Encounter (HOSPITAL_COMMUNITY): Payer: Self-pay | Admitting: Physician Assistant

## 2022-02-05 VITALS — BP 144/80 | HR 59 | Ht 65.0 in | Wt 179.8 lb

## 2022-02-05 DIAGNOSIS — E785 Hyperlipidemia, unspecified: Secondary | ICD-10-CM | POA: Insufficient documentation

## 2022-02-05 DIAGNOSIS — E119 Type 2 diabetes mellitus without complications: Secondary | ICD-10-CM | POA: Insufficient documentation

## 2022-02-05 DIAGNOSIS — Z7901 Long term (current) use of anticoagulants: Secondary | ICD-10-CM | POA: Diagnosis not present

## 2022-02-05 DIAGNOSIS — I4819 Other persistent atrial fibrillation: Secondary | ICD-10-CM | POA: Insufficient documentation

## 2022-02-05 DIAGNOSIS — I10 Essential (primary) hypertension: Secondary | ICD-10-CM | POA: Diagnosis not present

## 2022-02-05 DIAGNOSIS — K5090 Crohn's disease, unspecified, without complications: Secondary | ICD-10-CM | POA: Insufficient documentation

## 2022-02-05 NOTE — Progress Notes (Signed)
Primary Care Physician: [name], MD Primary Cardiologist: Dr Radford Pax (remotely) Primary Electrophysiologist: Dr Curt Bears Referring Physician: [facility]   Brittany [name] is a 56 y.o. female with a history of DM, HTN, HLD, Crohn's disease, atrial fibrillation who presents for follow up in the [facility].  She did present to the emergency room 04/01/2021 with chest pain and palpitations.  She was found to be in atrial fibrillation at that time.  She felt that she went into atrial fibrillation 11/18, after her mother passed away. She underwent DCCV on 07/03/21. Patient is on Xarelto for a CHADS2VASC score of 2. She presented to the ED 01/30/22 with palpitations. Her smart watch alerted her that she was in afib. She underwent repeat DCCV at that time.  On follow up today, patient remains in [place]. She admits she was under a lot of stress when she went into afib again. There were no other triggers that she could identify. She does admit to having very brief palpitations infrequently.   Today, she denies symptoms of chest pain, shortness of breath, orthopnea, PND, lower extremity edema, dizziness, presyncope, syncope, daytime somnolence, bleeding, or neurologic sequela. The patient is tolerating medications without difficulties and is otherwise without complaint today.    Atrial Fibrillation Risk Factors:  she does not have symptoms or diagnosis of sleep apnea. she does not have a history of rheumatic fever. she does not have a history of alcohol use. The patient does have a history of early familial atrial fibrillation or other arrhythmias. Mother and father had afib.   she has a BMI of Body mass index is 29.92 kg/m.Marland Kitchen Filed Weights   02/05/22 1108  Weight: 81.6 kg    Family History  Problem Relation Age of Onset   Heart Problems Mother    Diabetes Mother    Diabetes Father    Heart Problems Father    Cancer Maternal Grandmother        lung    Peptic Ulcer Disease Other      Atrial Fibrillation Management history:  Previous antiarrhythmic drugs: none Previous cardioversions: 07/03/21, 01/30/22 Previous ablations: none CHADS2VASC score: 2 Anticoagulation history: Xarelto    Past Medical History:  Diagnosis Date   BMI 26.0-26.9,adult 07/06/2019   Bradycardia 08/10/2014   Crohn's disease ([place])    DDD (degenerative disc disease), lumbar 07/17/2017   Diabetes mellitus without complication ([place])    Fatigue 08/10/2014   Hyperlipidemia    Hyperlipidemia associated with type 2 diabetes mellitus ([place]) 07/06/2019   Hypertension    Hypertension, essential    Mixed hyperlipidemia    Palpitations 07/06/2019   Paresthesia 07/06/2019   Past Surgical History:  Procedure Laterality Date   ABDOMINAL HYSTERECTOMY N/A 10/20/2012   Procedure: HYSTERECTOMY ABDOMINAL;  Surgeon: [name], MD;  Location: [facility];  Service: Gynecology;  Laterality: N/A;   ANAL FISSURE REPAIR  2002   APPENDECTOMY     CARDIOVERSION N/A 07/03/2021   Procedure: CARDIOVERSION;  Surgeon: Donato Heinz, MD;  Location: [facility];  Service: Cardiovascular;  Laterality: N/A;   SALPINGOOPHORECTOMY Bilateral 10/20/2012   Procedure: SALPINGO OOPHORECTOMY;  Surgeon: [name], MD;  Location: [facility];  Service: Gynecology;  Laterality: Bilateral;   SMALL INTESTINE SURGERY     reconstruction   TONSILLECTOMY      Current Outpatient Medications  Medication Sig Dispense Refill   Biotin 1000 MCG tablet Take 1,000 mcg by mouth daily.     calcium-vitamin D (OSCAL WITH D)  500-200 MG-UNIT per tablet Take 1 tablet by mouth daily.     estradiol (ESTRACE) 1 MG tablet TAKE 1 TABLET EVERY DAY 90 tablet 0   ezetimibe (ZETIA) 10 MG tablet TAKE 1 TABLET (10 MG TOTAL) BY MOUTH DAILY. 90 tablet 1   finasteride (PROPECIA) 1 MG tablet Take 1 tablet (1 mg total) by mouth daily. 90 tablet 0   fish oil-omega-3 fatty acids 1000 MG capsule Take 1 g by mouth in the morning and at bedtime.      gabapentin (NEURONTIN) 300 MG capsule Take 2 capsules (600 mg total) by mouth at bedtime. 180 capsule 1   Glucosamine-Chondroit-Vit C-Mn (GLUCOSAMINE 1500 COMPLEX PO) Take 2 tablets by mouth at bedtime.     rivaroxaban (XARELTO) 20 MG TABS tablet TAKE 1 TABLET BY MOUTH ONCE DAILY WITH SUPPER 90 tablet 1   ustekinumab (STELARA) 90 MG/ML SOSY injection Inject 90 mg into the skin every 8 (eight) weeks.     zolpidem (AMBIEN) 5 MG tablet TAKE 1 TABLET BY MOUTH EVERY DAY AT BEDTIME AS NEEDED FOR SLEEP 90 tablet 0   No current facility-administered medications for this encounter.    Allergies  Allergen Reactions   Mercaptopurine     Used to treat pt. Crohn's Disease.  Caused Pancreatis 1992.   Amitriptyline     No emotions    Crestor [Rosuvastatin Calcium]     Muscle pain   Metoprolol Other (See Comments)    Extreme fatigue    Trazodone And Nefazodone     Did not work for pt    Zocor [Simvastatin]     Cramps in legs   Morphine And Related Itching   Penicillins Rash    Social History   Socioeconomic History   Marital status: Widowed    Spouse name: Not on file   Number of children: 1   Years of education: Not on file   Highest education level: Not on file  Occupational History   Not on file  Tobacco Use   Smoking status: Former    Types: Cigarettes    Quit date: 2002    Years since quitting: 21.7   Smokeless tobacco: Never   Tobacco comments:    Former smoker 02/05/22  Vaping Use   Vaping Use: Never used  Substance and Sexual Activity   Alcohol use: Yes    Alcohol/week: 1.0 standard drink of alcohol    Types: 1 Standard drinks or equivalent per week    Comment: 1 drink per month 02/05/22   Drug use: No   Sexual activity: Not on file  Other Topics Concern   Not on file  Social History Narrative   wears sunscreen, brushes and flosses daily, see's dentist bi-annually, has smoke/carbon monoxide detectors, wears a seatbelt and practices gun safety   Social  Determinants of Health   Financial Resource Strain: Low Risk  (07/06/2021)   Overall Financial Resource Strain (CARDIA)    Difficulty of Paying Living Expenses: Not hard at all  Food Insecurity: No Food Insecurity (11/20/2021)   Hunger Vital Sign    Worried About Running Out of Food in the Last Year: Never true    [place] in the Last Year: Never true  Transportation Needs: No Transportation Needs (07/06/2021)   PRAPARE - Hydrologist (Medical): No    Lack of Transportation (Non-Medical): No  Physical Activity: Inactive (11/20/2021)   Exercise Vital Sign    Days of Exercise per Week: 0  days    Minutes of Exercise per Session: 0 min  Stress: No Stress Concern Present (11/20/2021)   [place]    Feeling of Stress : Not at all  Social Connections: Moderately Integrated (11/20/2021)   Social Connection and Isolation Panel [NHANES]    Frequency of Communication with Friends and Family: More than three times a week    Frequency of Social Gatherings with Friends and Family: More than three times a week    Attends Religious Services: More than 4 times per year    Active Member of [employer] or Organizations: No    Attends Archivist Meetings: Never    Marital Status: Married  Human resources officer Violence: Not At Risk (11/20/2021)   Humiliation, Afraid, Rape, and Kick questionnaire    Fear of Current or Ex-Partner: No    Emotionally Abused: No    Physically Abused: No    Sexually Abused: No     ROS- All systems are reviewed and negative except as per the HPI above.  Physical Exam: Vitals:   02/05/22 1108  BP: (!) 144/80  Pulse: (!) 59  Weight: 81.6 kg  Height: 5' 5"  (1.651 m)    GEN- The patient is a well appearing female, alert and oriented x 3 today.   Head- normocephalic, atraumatic Eyes-  Sclera clear, conjunctiva pink Ears- hearing intact Oropharynx- clear Neck- supple   Lungs- Clear to ausculation bilaterally, normal work of breathing Heart- Regular rate and rhythm, no murmurs, rubs or gallops  GI- soft, NT, ND, + BS Extremities- no clubbing, cyanosis, or edema MS- no significant deformity or atrophy Skin- no rash or lesion Psych- euthymic mood, full affect Neuro- strength and sensation are intact  Wt Readings from Last 3 Encounters:  02/05/22 81.6 kg  01/30/22 79.4 kg  01/15/22 80.7 kg    EKG today demonstrates  SB Vent. rate 59 BPM PR interval 164 ms QRS duration 78 ms QT/QTcB 428/423 ms  Echo 05/01/21 demonstrated   1. Left ventricular ejection fraction, by estimation, is 60 to 65%. The  left ventricle has normal function. The left ventricle has no regional  wall motion abnormalities. There is mild left ventricular hypertrophy of  the inferior segment. Left ventricular diastolic parameters are indeterminate.   2. Right ventricular systolic function is normal. The right ventricular  size is normal.   3. Left atrial size was mildly dilated.   4. The mitral valve is normal in structure. Mild mitral valve  regurgitation. No evidence of mitral stenosis.   5. The aortic valve is normal in structure. Aortic valve regurgitation is  not visualized. No aortic stenosis is present.   6. The inferior vena cava is normal in size with greater than 50%  respiratory variability, suggesting right atrial pressure of 3 mmHg.   Epic records are reviewed at length today  CHA2DS2-VASc Score = 2  The patient's score is based upon: CHF History: 0 HTN History: 0 (in patient history but controlled off medication) Diabetes History: 1 Stroke History: 0 Vascular Disease History: 0 Age Score: 0 Gender Score: 1       ASSESSMENT AND PLAN: 1. Persistent Atrial Fibrillation (ICD10:  I48.19) The patient's CHA2DS2-VASc score is 2, indicating a 2.2% annual risk of stroke.   S/p DCCV on 01/30/22 We discussed rhythm control options including AAD (dilt+flecainide,  Multaq, dofetilide) vs ablation. We also discussed using PRN diltiazem. She would like to continue her present medication for now and  discuss her options when she sees Dr Curt Bears on 10/12. Continue Xarelto 20 mg daily  2. HTN Per history in chart, controlled off medication.    Follow up with Dr Curt Bears as scheduled.    [facility] [address] [place], [place] [address] [phone] 02/05/2022 11:23 AM

## 2022-02-06 NOTE — Telephone Encounter (Signed)
Followed up with pt as discussed. Pt reports she is doing/feeling better. She is already scheduled to see Camnitz on 10/17 to further discuss treatment plan. She appreciates my follow up call.

## 2022-02-11 ENCOUNTER — Telehealth: Payer: Self-pay

## 2022-02-11 NOTE — Telephone Encounter (Signed)
    Patient  visit on  9/20 at [facility] Have you been able to follow up with your primary care physician?yes  The patient was or was not able to obtain any needed medicine or equipment.yes  Are there diet recommendations that you are having difficulty following?na  Patient expresses understanding of discharge instructions and education provided has no other needs at this time. yes    [place], Care Management  [phone] [address], [place], [place] [address] Phone: [phone] Email: [email or web address]

## 2022-02-18 DIAGNOSIS — K50919 Crohn's disease, unspecified, with unspecified complications: Secondary | ICD-10-CM | POA: Diagnosis not present

## 2022-02-19 ENCOUNTER — Ambulatory Visit: Admit: 2022-02-19 | Discharge: 2022-02-20 | Payer: MEDICARE | Attending: Gastroenterology | Primary: Gastroenterology

## 2022-02-19 DIAGNOSIS — Z79899 Other long term (current) drug therapy: Secondary | ICD-10-CM | POA: Diagnosis not present

## 2022-02-19 DIAGNOSIS — K50919 Crohn's disease, unspecified, with unspecified complications: Secondary | ICD-10-CM | POA: Diagnosis not present

## 2022-02-19 DIAGNOSIS — K5080 Crohn's disease of both small and large intestine without complications: Secondary | ICD-10-CM | POA: Diagnosis not present

## 2022-02-19 DIAGNOSIS — K50812 Crohn's disease of both small and large intestine with intestinal obstruction: Secondary | ICD-10-CM | POA: Diagnosis not present

## 2022-02-20 NOTE — Progress Notes (Signed)
Subjective:  Patient ID: Brittany [name], female    DOB: June 15, 1965  Age: 56 y.o. MRN: [id number]  Chief Complaint  Patient presents with   Hypertension   Atrial Fibrillation   Hyperlipidemia    HPI Afib: Taking Xarelto 20 mg daily. Patient was seen in the ED on 01/30/2022 and had cardioversion and returned to [facility]. Discharged home. Continued on xarelto 20 mg once daily. Symptoms were flutters and diaphoresis/chills. CMP, CBC, Troponin, magnesium, tsh normal. Patient followed up with Atrial fibrillation clinic. Scheduled to see Dr. Curt Bears next week.    Hyperlipidemia: Takes Zetia 10 mg daily, Fish oil 1000 mg  in the morning and at bedtime.  Hair loss: Finasteride 1 mg daily. Helping some.   Insomnia: Zolpidem 5 mg at bedtime.  Crohn's disease: Stelara 90 mg/ml injection every 8 weeks.  Chronic back pain with some sciatica. On gabapentin 300 mg 2 before bed.   Current Outpatient Medications on File Prior to Visit  Medication Sig Dispense Refill   calcium-vitamin D (OSCAL WITH D) 500-200 MG-UNIT per tablet Take 1 tablet by mouth daily.     estradiol (ESTRACE) 1 MG tablet TAKE 1 TABLET EVERY DAY 90 tablet 0   ezetimibe (ZETIA) 10 MG tablet TAKE 1 TABLET (10 MG TOTAL) BY MOUTH DAILY. 90 tablet 1   finasteride (PROPECIA) 1 MG tablet Take 1 tablet (1 mg total) by mouth daily. 90 tablet 0   fish oil-omega-3 fatty acids 1000 MG capsule Take 1 g by mouth in the morning and at bedtime.     gabapentin (NEURONTIN) 300 MG capsule Take 2 capsules (600 mg total) by mouth at bedtime. 180 capsule 1   Glucosamine-Chondroit-Vit C-Mn (GLUCOSAMINE 1500 COMPLEX PO) Take 2 tablets by mouth at bedtime.     rivaroxaban (XARELTO) 20 MG TABS tablet TAKE 1 TABLET BY MOUTH ONCE DAILY WITH SUPPER 90 tablet 1   ustekinumab (STELARA) 90 MG/ML SOSY injection Inject 90 mg into the skin every 8 (eight) weeks.     No current facility-administered medications on file prior to visit.   Past Medical History:   Diagnosis Date   BMI 26.0-26.9,adult 07/06/2019   Bradycardia 08/10/2014   Crohn's disease ([place])    DDD (degenerative disc disease), lumbar 07/17/2017   Diabetes mellitus without complication ([place])    Fatigue 08/10/2014   Hyperlipidemia    Hyperlipidemia associated with type 2 diabetes mellitus ([place]) 07/06/2019   Hypertension    Hypertension, essential    Mixed hyperlipidemia    Palpitations 07/06/2019   Paresthesia 07/06/2019   Past Surgical History:  Procedure Laterality Date   ABDOMINAL HYSTERECTOMY N/A 10/20/2012   Procedure: HYSTERECTOMY ABDOMINAL;  Surgeon: [name], MD;  Location: [facility];  Service: Gynecology;  Laterality: N/A;   ANAL FISSURE REPAIR  2002   APPENDECTOMY     CARDIOVERSION N/A 07/03/2021   Procedure: CARDIOVERSION;  Surgeon: Donato Heinz, MD;  Location: [facility];  Service: Cardiovascular;  Laterality: N/A;   SALPINGOOPHORECTOMY Bilateral 10/20/2012   Procedure: SALPINGO OOPHORECTOMY;  Surgeon: [name], MD;  Location: [facility];  Service: Gynecology;  Laterality: Bilateral;   SMALL INTESTINE SURGERY     reconstruction   TONSILLECTOMY      Family History  Problem Relation Age of Onset   Heart Problems Mother    Diabetes Mother    Diabetes Father    Heart Problems Father    Cancer Maternal Grandmother        lung   Peptic Ulcer Disease Other  Social History   Socioeconomic History   Marital status: Widowed    Spouse name: Not on file   Number of children: 1   Years of education: Not on file   Highest education level: Not on file  Occupational History   Not on file  Tobacco Use   Smoking status: Former    Types: Cigarettes    Quit date: 2002    Years since quitting: 21.8   Smokeless tobacco: Never   Tobacco comments:    Former smoker 02/05/22  Vaping Use   Vaping Use: Never used  Substance and Sexual Activity   Alcohol use: Yes    Alcohol/week: 1.0 standard drink of alcohol    Types: 1 Standard drinks or equivalent per week     Comment: 1 drink per month 02/05/22   Drug use: No   Sexual activity: Not on file  Other Topics Concern   Not on file  Social History Narrative   wears sunscreen, brushes and flosses daily, see's dentist bi-annually, has smoke/carbon monoxide detectors, wears a seatbelt and practices gun safety   Social Determinants of Health   Financial Resource Strain: Low Risk  (07/06/2021)   Overall Financial Resource Strain (CARDIA)    Difficulty of Paying Living Expenses: Not hard at all  Food Insecurity: No Food Insecurity (11/20/2021)   Hunger Vital Sign    Worried About Running Out of Food in the Last Year: Never true    [place] in the Last Year: Never true  Transportation Needs: No Transportation Needs (07/06/2021)   PRAPARE - Hydrologist (Medical): No    Lack of Transportation (Non-Medical): No  Physical Activity: Inactive (11/20/2021)   Exercise Vital Sign    Days of Exercise per Week: 0 days    Minutes of Exercise per Session: 0 min  Stress: No Stress Concern Present (11/20/2021)   [place]    Feeling of Stress : Not at all  Social Connections: Moderately Integrated (11/20/2021)   Social Connection and Isolation Panel [NHANES]    Frequency of Communication with Friends and Family: More than three times a week    Frequency of Social Gatherings with Friends and Family: More than three times a week    Attends Religious Services: More than 4 times per year    Active Member of [employer] or Organizations: No    Attends Archivist Meetings: Never    Marital Status: Married    Review of Systems  Constitutional:  Negative for chills, fatigue and fever.  HENT:  Negative for congestion, ear pain and sore throat.   Respiratory:  Negative for cough and shortness of breath.   Cardiovascular:  Negative for chest pain and palpitations.  Gastrointestinal:  Negative for abdominal pain,  constipation, diarrhea, nausea and vomiting.  Endocrine: Negative for polydipsia, polyphagia and polyuria.  Genitourinary:  Negative for difficulty urinating and dysuria.  Musculoskeletal:  Negative for arthralgias, back pain and myalgias.  Skin:  Negative for rash.  Neurological:  Negative for headaches.  Psychiatric/Behavioral:  Negative for dysphoric mood. The patient is not nervous/anxious.      Objective:  BP 124/70   Pulse 60   Temp 98 F (36.7 C)   Resp 14   Ht 5' 5"  (1.651 m)   Wt 181 lb (82.1 kg)   LMP 09/23/2012   BMI 30.12 kg/m      02/26/2022   11:18 AM 02/21/2022  8:12 AM 02/05/2022   11:08 AM  BP/Weight  Systolic BP 505 697 948  Diastolic BP 76 70 80  Wt. (Lbs) 179.8 181 179.8  BMI 29.92 kg/m2 30.12 kg/m2 29.92 kg/m2    Physical Exam Vitals reviewed.  Constitutional:      Appearance: Normal appearance. She is normal weight.  Neck:     Vascular: No carotid bruit.  Cardiovascular:     Rate and Rhythm: Normal rate and regular rhythm.     Heart sounds: Normal heart sounds.  Pulmonary:     Effort: Pulmonary effort is normal. No respiratory distress.     Breath sounds: Normal breath sounds.  Abdominal:     General: Abdomen is flat. Bowel sounds are normal.     Palpations: Abdomen is soft.     Tenderness: There is no abdominal tenderness.  Neurological:     Mental Status: She is alert and oriented to person, place, and time.  Psychiatric:        Mood and Affect: Mood normal.        Behavior: Behavior normal.     Diabetic Foot Exam - Simple   No data filed      Lab Results  Component Value Date   WBC 5.1 02/21/2022   HGB 13.5 02/21/2022   HCT 41.7 02/21/2022   PLT 191 02/21/2022   GLUCOSE 89 02/21/2022   CHOL 212 (H) 02/21/2022   TRIG 121 02/21/2022   HDL 83 02/21/2022   LDLCALC 108 (H) 02/21/2022   ALT 13 02/21/2022   AST 17 02/21/2022   NA 141 02/21/2022   K 4.3 02/21/2022   CL 103 02/21/2022   CREATININE 0.73 02/21/2022   BUN  13 02/21/2022   CO2 25 02/21/2022   TSH 1.721 01/30/2022   INR 0.86 10/20/2012   HGBA1C 5.4 02/21/2022   MICROALBUR 30 08/25/2020      Assessment & Plan:   Problem List Items Addressed This Visit       Cardiovascular and Mediastinum   Hypertension, essential - Primary   Persistent atrial fibrillation (HCC)    The current medical regimen is effective;  continue present plan and medications.         Nervous and Auditory   Idiopathic neuropathy    The current medical regimen is effective;  continue present plan and medications.         Musculoskeletal and Integument   Drug-induced myopathy    Intolerant to statins.       Telogen effluvium    The current medical regimen is fairly effective;  continue present plan and medications.         Other   Mixed hyperlipidemia    Recommend continue to work on eating healthy diet and exercise. The current medical regimen is effective;  continue present plan and medications. Check labs.        Relevant Orders   Lipid panel (Completed)   Crohn's disease ([place])    The current medical regimen is effective;  continue present plan and medications. Management per specialist.        Relevant Orders   C-reactive protein (Completed)   Iron, TIBC and Ferritin Panel (Completed)   VITAMIN D 25 Hydroxy (Vit-D Deficiency, Fractures) (Completed)   Prediabetes    Recommend continue to work on eating healthy diet and exercise.       Relevant Orders   Hemoglobin A1c (Completed)   B12 deficiency    The current medical regimen is effective;  continue present  plan and medications.       Relevant Orders   Vitamin B12 (Completed)   Primary insomnia    The current medical regimen is effective;  continue present plan and medications.       Relevant Medications   zolpidem (AMBIEN) 5 MG tablet   Other Visit Diagnoses     Encounter for immunization       Relevant Orders   Flu Vaccine MDCK QUAD PF (Completed)     .  Meds  ordered this encounter  Medications   zolpidem (AMBIEN) 5 MG tablet    Sig: TAKE 1 TABLET BY MOUTH EVERY DAY AT BEDTIME AS NEEDED FOR SLEEP    Dispense:  90 tablet    Refill:  0    Not to exceed 5 additional fills before 11/17/2021    Orders Placed This Encounter  Procedures   Flu Vaccine MDCK QUAD PF   Comprehensive metabolic panel   Hemoglobin A1c   Lipid panel   CBC with Differential/Platelet   Vitamin B12   C-reactive protein   Iron, TIBC and Ferritin Panel   VITAMIN D 25 Hydroxy (Vit-D Deficiency, Fractures)   Cardiovascular Risk Assessment     Follow-up: Return in about 4 months (around 06/24/2022).  An After Visit Summary was printed and given to the patient.  [name], MD [facility] [phone]

## 2022-02-21 ENCOUNTER — Ambulatory Visit (INDEPENDENT_AMBULATORY_CARE_PROVIDER_SITE_OTHER): Payer: Medicare HMO | Admitting: Family Medicine

## 2022-02-21 ENCOUNTER — Encounter: Payer: Self-pay | Admitting: Family Medicine

## 2022-02-21 VITALS — BP 124/70 | HR 60 | Temp 98.0°F | Resp 14 | Ht 65.0 in | Wt 181.0 lb

## 2022-02-21 DIAGNOSIS — F5101 Primary insomnia: Secondary | ICD-10-CM

## 2022-02-21 DIAGNOSIS — I10 Essential (primary) hypertension: Secondary | ICD-10-CM

## 2022-02-21 DIAGNOSIS — I480 Paroxysmal atrial fibrillation: Secondary | ICD-10-CM

## 2022-02-21 DIAGNOSIS — L650 Telogen effluvium: Secondary | ICD-10-CM

## 2022-02-21 DIAGNOSIS — Z23 Encounter for immunization: Secondary | ICD-10-CM | POA: Diagnosis not present

## 2022-02-21 DIAGNOSIS — G720 Drug-induced myopathy: Secondary | ICD-10-CM

## 2022-02-21 DIAGNOSIS — I4819 Other persistent atrial fibrillation: Secondary | ICD-10-CM

## 2022-02-21 DIAGNOSIS — G609 Hereditary and idiopathic neuropathy, unspecified: Secondary | ICD-10-CM | POA: Diagnosis not present

## 2022-02-21 DIAGNOSIS — E782 Mixed hyperlipidemia: Secondary | ICD-10-CM | POA: Diagnosis not present

## 2022-02-21 DIAGNOSIS — K5080 Crohn's disease of both small and large intestine without complications: Secondary | ICD-10-CM

## 2022-02-21 DIAGNOSIS — E538 Deficiency of other specified B group vitamins: Secondary | ICD-10-CM

## 2022-02-21 DIAGNOSIS — R7303 Prediabetes: Secondary | ICD-10-CM | POA: Diagnosis not present

## 2022-02-21 MED ORDER — ZOLPIDEM TARTRATE 5 MG PO TABS: ORAL_TABLET | ORAL | 0 refills | Status: DC

## 2022-02-22 LAB — COMPREHENSIVE METABOLIC PANEL
ALT: 13 IU/L (ref 0–32)
AST: 17 IU/L (ref 0–40)
Albumin/Globulin Ratio: 1.8 (ref 1.2–2.2)
Albumin: 4.2 g/dL (ref 3.8–4.9)
Alkaline Phosphatase: 61 IU/L (ref 44–121)
BUN/Creatinine Ratio: 18 (ref 9–23)
BUN: 13 mg/dL (ref 6–24)
Bilirubin Total: 0.5 mg/dL (ref 0.0–1.2)
CO2: 25 mmol/L (ref 20–29)
Calcium: 9.4 mg/dL (ref 8.7–10.2)
Chloride: 103 mmol/L (ref 96–106)
Creatinine, Ser: 0.73 mg/dL (ref 0.57–1.00)
Globulin, Total: 2.3 g/dL (ref 1.5–4.5)
Glucose: 89 mg/dL (ref 70–99)
Potassium: 4.3 mmol/L (ref 3.5–5.2)
Sodium: 141 mmol/L (ref 134–144)
Total Protein: 6.5 g/dL (ref 6.0–8.5)
eGFR: 95 mL/min/{1.73_m2} (ref >59)

## 2022-02-22 LAB — IRON,TIBC AND FERRITIN PANEL
Ferritin: 130 ng/mL (ref 15–150)
Iron Saturation: 29 % (ref 15–55)
Iron: 87 ug/dL (ref 27–159)
Total Iron Binding Capacity: 298 ug/dL (ref 250–450)
UIBC: 211 ug/dL (ref 131–425)

## 2022-02-22 LAB — CBC WITH DIFFERENTIAL/PLATELET
Basophils Absolute: 0.1 10*3/uL (ref 0.0–0.2)
Basos: 1 %
EOS (ABSOLUTE): 0.1 10*3/uL (ref 0.0–0.4)
Eos: 2 %
Hematocrit: 41.7 % (ref 34.0–46.6)
Hemoglobin: 13.5 g/dL (ref 11.1–15.9)
Immature Grans (Abs): 0 10*3/uL (ref 0.0–0.1)
Immature Granulocytes: 0 %
Lymphocytes Absolute: 1.6 10*3/uL (ref 0.7–3.1)
Lymphs: 32 %
MCH: 29.4 pg (ref 26.6–33.0)
MCHC: 32.4 g/dL (ref 31.5–35.7)
MCV: 91 fL (ref 79–97)
Monocytes Absolute: 0.4 10*3/uL (ref 0.1–0.9)
Monocytes: 8 %
Neutrophils Absolute: 2.9 10*3/uL (ref 1.4–7.0)
Neutrophils: 57 %
Platelets: 191 10*3/uL (ref 150–450)
RBC: 4.59 x10E6/uL (ref 3.77–5.28)
RDW: 12.5 % (ref 11.7–15.4)
WBC: 5.1 10*3/uL (ref 3.4–10.8)

## 2022-02-22 LAB — LIPID PANEL
Chol/HDL Ratio: 2.6 ratio (ref 0.0–4.4)
Cholesterol, Total: 212 mg/dL — ABNORMAL HIGH (ref 100–199)
HDL: 83 mg/dL (ref >39)
LDL Chol Calc (NIH): 108 mg/dL — ABNORMAL HIGH (ref 0–99)
Triglycerides: 121 mg/dL (ref 0–149)
VLDL Cholesterol Cal: 21 mg/dL (ref 5–40)

## 2022-02-22 LAB — HEMOGLOBIN A1C
Est. average glucose Bld gHb Est-mCnc: 108 mg/dL
Hgb A1c MFr Bld: 5.4 % (ref 4.8–5.6)

## 2022-02-22 LAB — VITAMIN D 25 HYDROXY (VIT D DEFICIENCY, FRACTURES): Vit D, 25-Hydroxy: 35.1 ng/mL (ref 30.0–100.0)

## 2022-02-22 LAB — C-REACTIVE PROTEIN: CRP: 4 mg/L (ref 0–10)

## 2022-02-22 LAB — VITAMIN B12: Vitamin B-12: 357 pg/mL (ref 232–1245)

## 2022-02-22 LAB — CARDIOVASCULAR RISK ASSESSMENT

## 2022-02-26 ENCOUNTER — Ambulatory Visit: Payer: Medicare HMO | Attending: Cardiology | Admitting: Cardiology

## 2022-02-26 ENCOUNTER — Encounter: Payer: Self-pay | Admitting: Cardiology

## 2022-02-26 VITALS — BP 124/76 | HR 50 | Ht 65.0 in | Wt 179.8 lb

## 2022-02-26 DIAGNOSIS — I4819 Other persistent atrial fibrillation: Secondary | ICD-10-CM | POA: Diagnosis not present

## 2022-02-26 NOTE — Addendum Note (Signed)
Addended by: Drue Novel I on: 02/26/2022 07:28 PM   Modules accepted: Orders

## 2022-02-26 NOTE — Progress Notes (Signed)
Electrophysiology Office Note   Date:  02/26/2022   ID:  Brittany [name], DOB 1966-01-06, MRN [id number]  PCP:  [name], MD  Cardiologist:   Primary Electrophysiologist:  Copelyn Widmer Meredith Leeds, MD    Chief Complaint: bradycardia   History of Present Illness: Brittany [name] is a 56 y.o. female who is being seen today for the evaluation of bradycardia at the request of [name], [name], MD. Presenting today for electrophysiology evaluation.  He has a history significant for diabetes, hypertension, hyperlipidemia, Crohn's disease.  She presented to cardiology clinic post COVID with palpitations and bradycardia.  She was found to have heart rates in the 40s with fatigue.  She then presented to the emergency room with palpitations 04/01/2021 and was found to be in atrial fibrillation.  She had a cardioversion 07/03/2021..  Today, denies symptoms of palpitations, chest pain, shortness of breath, orthopnea, PND, lower extremity edema, claudication, dizziness, presyncope, syncope, bleeding, or neurologic sequela. The patient is tolerating medications without difficulties.  Since being seen she has overall done well.  She did have 1 episode of atrial fibrillation requiring emergency room visit and cardioversion.  She felt weak, fatigued, sweaty during that episode.  She does have intermittent palpitations that occur mainly when she tries to get up and move around quickly.  She is under quite a bit of stress that she is in the process of selling her house and moving.  She feels that her stress is affecting her atrial fibrillation.   Past Medical History:  Diagnosis Date   BMI 26.0-26.9,adult 07/06/2019   Bradycardia 08/10/2014   Crohn's disease ()    DDD (degenerative disc disease), lumbar 07/17/2017   Diabetes mellitus without complication ([place])    Fatigue 08/10/2014   Hyperlipidemia    Hyperlipidemia associated with type 2 diabetes mellitus ([place]) 07/06/2019   Hypertension     Hypertension, essential    Mixed hyperlipidemia    Palpitations 07/06/2019   Paresthesia 07/06/2019   Past Surgical History:  Procedure Laterality Date   ABDOMINAL HYSTERECTOMY N/A 10/20/2012   Procedure: HYSTERECTOMY ABDOMINAL;  Surgeon: [name], MD;  Location: [facility];  Service: Gynecology;  Laterality: N/A;   ANAL FISSURE REPAIR  2002   APPENDECTOMY     CARDIOVERSION N/A 07/03/2021   Procedure: CARDIOVERSION;  Surgeon: Donato Heinz, MD;  Location: [facility];  Service: Cardiovascular;  Laterality: N/A;   SALPINGOOPHORECTOMY Bilateral 10/20/2012   Procedure: SALPINGO OOPHORECTOMY;  Surgeon: [name], MD;  Location: [facility];  Service: Gynecology;  Laterality: Bilateral;   SMALL INTESTINE SURGERY     reconstruction   TONSILLECTOMY       Current Outpatient Medications  Medication Sig Dispense Refill   calcium-vitamin D (OSCAL WITH D) 500-200 MG-UNIT per tablet Take 1 tablet by mouth daily.     estradiol (ESTRACE) 1 MG tablet TAKE 1 TABLET EVERY DAY 90 tablet 0   ezetimibe (ZETIA) 10 MG tablet TAKE 1 TABLET (10 MG TOTAL) BY MOUTH DAILY. 90 tablet 1   finasteride (PROPECIA) 1 MG tablet Take 1 tablet (1 mg total) by mouth daily. 90 tablet 0   fish oil-omega-3 fatty acids 1000 MG capsule Take 1 g by mouth in the morning and at bedtime.     gabapentin (NEURONTIN) 300 MG capsule Take 2 capsules (600 mg total) by mouth at bedtime. 180 capsule 1   Glucosamine-Chondroit-Vit C-Mn (GLUCOSAMINE 1500 COMPLEX PO) Take 2 tablets by mouth at bedtime.     rivaroxaban (XARELTO) 20 MG TABS tablet  TAKE 1 TABLET BY MOUTH ONCE DAILY WITH SUPPER 90 tablet 1   ustekinumab (STELARA) 90 MG/ML SOSY injection Inject 90 mg into the skin every 8 (eight) weeks.     zolpidem (AMBIEN) 5 MG tablet TAKE 1 TABLET BY MOUTH EVERY DAY AT BEDTIME AS NEEDED FOR SLEEP 90 tablet 0   No current facility-administered medications for this visit.    Allergies:   Mercaptopurine, Amitriptyline, Crestor [rosuvastatin  calcium], Metoprolol, Trazodone and nefazodone, Zocor [simvastatin], Morphine and related, and Penicillins   Social History:  The patient  reports that she quit smoking about 21 years ago. Her smoking use included cigarettes. She has never used smokeless tobacco. She reports current alcohol use of about 1.0 standard drink of alcohol per week. She reports that she does not use drugs.   Family History:  The patient's family history includes Cancer in her maternal grandmother; Diabetes in her father and mother; Heart Problems in her father and mother; Peptic Ulcer Disease in an other family member.   ROS:  Please see the history of present illness.   Otherwise, review of systems is positive for none.   All other systems are reviewed and negative.   PHYSICAL EXAM: VS:  BP 124/76   Pulse (!) 50   Ht 5' 5"  (1.651 m)   Wt 179 lb 12.8 oz (81.6 kg)   LMP 09/23/2012   SpO2 97%   BMI 29.92 kg/m  , BMI Body mass index is 29.92 kg/m. GEN: Well nourished, well developed, in no acute distress  HEENT: normal  Neck: no JVD, carotid bruits, or masses Cardiac: RRR; no murmurs, rubs, or gallops,no edema  Respiratory:  clear to auscultation bilaterally, normal work of breathing GI: soft, nontender, nondistended, + BS MS: no deformity or atrophy  Skin: warm and dry Neuro:  Strength and sensation are intact Psych: euthymic mood, full affect  EKG:  EKG is ordered today. Personal review of the ekg ordered shows sinus rhythm, rate 50  Recent Labs: 04/01/2021: B Natriuretic Peptide 230.1 01/30/2022: Magnesium 2.0; TSH 1.721 02/21/2022: ALT 13; BUN 13; Creatinine, Ser 0.73; Hemoglobin 13.5; Platelets 191; Potassium 4.3; Sodium 141    Lipid Panel     Component Value Date/Time   CHOL 212 (H) 02/21/2022 1130   TRIG 121 02/21/2022 1130   HDL 83 02/21/2022 1130   CHOLHDL 2.6 02/21/2022 1130   LDLCALC 108 (H) 02/21/2022 1130     Wt Readings from Last 3 Encounters:  02/26/22 179 lb 12.8 oz (81.6 kg)   02/21/22 181 lb (82.1 kg)  02/05/22 179 lb 12.8 oz (81.6 kg)      Other studies Reviewed: Additional studies/ records that were reviewed today include: TTE 05/01/21 Review of the above records today demonstrates:   1. Left ventricular ejection fraction, by estimation, is 60 to 65%. The  left ventricle has normal function. The left ventricle has no regional  wall motion abnormalities. There is mild left ventricular hypertrophy of  the inferior segment. Left  ventricular diastolic parameters are indeterminate.   2. Right ventricular systolic function is normal. The right ventricular  size is normal.   3. Left atrial size was mildly dilated.   4. The mitral valve is normal in structure. Mild mitral valve  regurgitation. No evidence of mitral stenosis.   5. The aortic valve is normal in structure. Aortic valve regurgitation is  not visualized. No aortic stenosis is present.   6. The inferior vena cava is normal in size with greater than 50%  respiratory variability, suggesting right atrial pressure of 3 mmHg.    Myoview 04/18/2021 Low risk stress nuclear study with normal perfusion and normal left ventricular regional and global systolic function.  ASSESSMENT AND PLAN:  1.  Persistent atrial fibrillation: CHA2DS2-VASc of 1.  Currently on Xarelto 20 mg daily.  She has had 1 further episode of atrial fibrillation requiring cardioversion approximately month ago.  Despite that, she is overall happy with how she has been feeling and prefer to avoid medications or ablation for now.  She Ettamae Barkett let us know if she changes her mind.  2.  Hypertension: Has been well controlled since losing weight.  On no medications at this time.  3.  Hyperlipidemia: Diet and exercise controlled.   Current medicines are reviewed at length with the patient today.   The patient does not have concerns regarding her medicines.  The following changes were made today: None  Labs/ tests ordered today include:  No  orders of the defined types were placed in this encounter.    Disposition:   FU with Emma Birchler 6 months  Signed, Albirta Rhinehart Meredith Leeds, MD  02/26/2022 11:31 AM     [facility] [address] [place] [address] [phone] (office) [phone] (fax)

## 2022-02-27 ENCOUNTER — Ambulatory Visit: Payer: Medicare HMO | Admitting: Cardiology

## 2022-03-01 ENCOUNTER — Ambulatory Visit: Payer: Medicare HMO | Admitting: Cardiology

## 2022-03-02 NOTE — Assessment & Plan Note (Signed)
Recommend continue to work on eating healthy diet and exercise.  

## 2022-03-02 NOTE — Assessment & Plan Note (Signed)
The current medical regimen is effective;  continue present plan and medications.  

## 2022-03-02 NOTE — Assessment & Plan Note (Signed)
The current medical regimen is fairly effective;  continue present plan and medications.

## 2022-03-02 NOTE — Assessment & Plan Note (Signed)
Intolerant to statins. 

## 2022-03-02 NOTE — Assessment & Plan Note (Signed)
The current medical regimen is effective;  continue present plan and medications. Management per specialist.

## 2022-03-02 NOTE — Assessment & Plan Note (Signed)
Recommend continue to work on eating healthy diet and exercise. The current medical regimen is effective;  continue present plan and medications. Check labs.

## 2022-03-04 ENCOUNTER — Other Ambulatory Visit: Payer: Self-pay

## 2022-03-04 DIAGNOSIS — L650 Telogen effluvium: Secondary | ICD-10-CM

## 2022-03-04 MED ORDER — FINASTERIDE 1 MG PO TABS: 1.0000 mg | ORAL_TABLET | Freq: Every day | ORAL | 0 refills | Status: DC

## 2022-03-06 DIAGNOSIS — H26492 Other secondary cataract, left eye: Secondary | ICD-10-CM | POA: Diagnosis not present

## 2022-03-06 DIAGNOSIS — D3131 Benign neoplasm of right choroid: Secondary | ICD-10-CM | POA: Diagnosis not present

## 2022-03-06 DIAGNOSIS — H04123 Dry eye syndrome of bilateral lacrimal glands: Secondary | ICD-10-CM | POA: Diagnosis not present

## 2022-03-06 DIAGNOSIS — H26491 Other secondary cataract, right eye: Secondary | ICD-10-CM | POA: Diagnosis not present

## 2022-03-06 DIAGNOSIS — Z961 Presence of intraocular lens: Secondary | ICD-10-CM | POA: Diagnosis not present

## 2022-03-06 DIAGNOSIS — H5201 Hypermetropia, right eye: Secondary | ICD-10-CM | POA: Diagnosis not present

## 2022-03-12 DIAGNOSIS — L578 Other skin changes due to chronic exposure to nonionizing radiation: Secondary | ICD-10-CM | POA: Diagnosis not present

## 2022-03-12 DIAGNOSIS — L570 Actinic keratosis: Secondary | ICD-10-CM | POA: Diagnosis not present

## 2022-03-12 DIAGNOSIS — L821 Other seborrheic keratosis: Secondary | ICD-10-CM | POA: Diagnosis not present

## 2022-03-14 DIAGNOSIS — M4326 Fusion of spine, lumbar region: Secondary | ICD-10-CM | POA: Diagnosis not present

## 2022-03-15 ENCOUNTER — Other Ambulatory Visit: Payer: Self-pay

## 2022-03-15 DIAGNOSIS — F5101 Primary insomnia: Secondary | ICD-10-CM

## 2022-03-15 MED ORDER — ZOLPIDEM TARTRATE 5 MG PO TABS: ORAL_TABLET | ORAL | 0 refills | Status: DC

## 2022-03-18 ENCOUNTER — Other Ambulatory Visit: Payer: Self-pay | Admitting: Physician Assistant

## 2022-03-29 MED ORDER — BISACODYL 5 MG TABLET,DELAYED RELEASE
ORAL_TABLET | Freq: Once | ORAL | 0 refills | 1 days | Status: CP
Start: 2022-03-29 — End: 2022-03-29
  Filled 2022-04-16: qty 119, 1d supply, fill #0
  Filled 2022-04-16: qty 238, 1d supply, fill #0

## 2022-03-29 MED ORDER — POLYETHYLENE GLYCOL 3350 17 GRAM/DOSE ORAL POWDER
ORAL | 0 refills | 0.00000 days | Status: CP
Start: 2022-03-29 — End: ?

## 2022-03-29 MED ORDER — SIMETHICONE 125 MG CHEWABLE TABLET
ORAL_TABLET | ORAL | 0 refills | 1 days | Status: CP
Start: 2022-03-29 — End: ?
  Filled 2022-04-16: qty 4, 1d supply, fill #0

## 2022-04-16 DIAGNOSIS — K50919 Crohn's disease, unspecified, with unspecified complications: Secondary | ICD-10-CM | POA: Diagnosis not present

## 2022-04-16 MED FILL — BISACODYL 5 MG TABLET,DELAYED RELEASE: ORAL | 1 days supply | Qty: 2 | Fill #0

## 2022-04-20 ENCOUNTER — Other Ambulatory Visit: Payer: Self-pay | Admitting: Cardiology

## 2022-04-20 DIAGNOSIS — I4819 Other persistent atrial fibrillation: Secondary | ICD-10-CM

## 2022-04-22 NOTE — Telephone Encounter (Signed)
Prescription refill request for Xarelto received.  Indication:afib Last office visit:10/23 Weight:81.6  kg Age:56 Scr:0.7 CrCl:112.85  ml/min  Prescription refilled

## 2022-04-26 ENCOUNTER — Telehealth: Payer: Self-pay | Admitting: Cardiology

## 2022-04-26 NOTE — Telephone Encounter (Signed)
Called and spoke with the patient. Received the clearance late in the day but was able to talk with her and Pharm D was able to address the clearance for Xarelto.   Was able to call and verify that patient can achieve > 4 METS activity without concerning cardiac symptoms.  The patient is doing well from a cardiac perspective. Therefore, based on ACC/AHA guidelines, the patient would be at acceptable risk for the planned procedure without further cardiovascular testing.   Per office protocol, patient can hold Xarelto for 2 days prior to procedure.   Patient will not need bridging with Lovenox (enoxaparin) around procedure.  Emmaline Life, NP-C  04/26/2022, 5:04 PM [address] Office [phone] Fax [phone]

## 2022-04-26 NOTE — Telephone Encounter (Signed)
   Pre-operative Risk Assessment    Patient Name: Brittany [name]  DOB: 11-12-1965 MRN: [id number]      Request for Surgical Clearance    Procedure:   Colonoscopy  Date of Surgery:  Clearance 04/30/22                                 Surgeon:  Dr. Shelton Silvas Surgeon's Group or Practice Name:   Phone number:   Fax number:  [phone]   Type of Clearance Requested:   - Pharmacy:  Hold Rivaroxaban (Xarelto)     Type of Anesthesia:   Propofol   Additional requests/questions:   Caller stated the patient's Xarelto will need to be held and will need emergency clearance to have this medication stopped.  Signed, Heloise Beecham   04/26/2022, 3:51 PM

## 2022-04-26 NOTE — Telephone Encounter (Signed)
Patient with diagnosis of atrial fibrillation on Xarelto for anticoagulation.    Procedure: colonoscopy Date of procedure: 04/30/22   CHA2DS2-VASc Score = 2   This indicates a 2.2% annual risk of stroke. The patient's score is based upon: CHF History: 0 HTN History: 1 Diabetes History: 0 Stroke History: 0 Vascular Disease History: 0 Age Score: 0 Gender Score: 1       CrCl 108 Platelet count 191  Per office protocol, patient can hold Xarelto for 2 days prior to procedure.   Patient will not need bridging with Lovenox (enoxaparin) around procedure.  **This guidance is not considered finalized until pre-operative APP has relayed final recommendations.**

## 2022-04-30 ENCOUNTER — Encounter: Admit: 2022-04-30 | Discharge: 2022-04-30 | Payer: MEDICARE | Attending: Anesthesiology | Primary: Anesthesiology

## 2022-04-30 ENCOUNTER — Ambulatory Visit: Admit: 2022-04-30 | Discharge: 2022-04-30 | Payer: MEDICARE

## 2022-04-30 DIAGNOSIS — K50812 Crohn's disease of both small and large intestine with intestinal obstruction: Secondary | ICD-10-CM | POA: Diagnosis not present

## 2022-04-30 DIAGNOSIS — R011 Cardiac murmur, unspecified: Secondary | ICD-10-CM | POA: Diagnosis not present

## 2022-04-30 DIAGNOSIS — M4726 Other spondylosis with radiculopathy, lumbar region: Secondary | ICD-10-CM | POA: Diagnosis not present

## 2022-04-30 DIAGNOSIS — R7303 Prediabetes: Secondary | ICD-10-CM | POA: Diagnosis not present

## 2022-04-30 DIAGNOSIS — Z86010 Personal history of colonic polyps: Secondary | ICD-10-CM | POA: Diagnosis not present

## 2022-04-30 DIAGNOSIS — K5090 Crohn's disease, unspecified, without complications: Secondary | ICD-10-CM | POA: Diagnosis not present

## 2022-04-30 DIAGNOSIS — K5730 Diverticulosis of large intestine without perforation or abscess without bleeding: Secondary | ICD-10-CM | POA: Diagnosis not present

## 2022-04-30 DIAGNOSIS — Z980 Intestinal bypass and anastomosis status: Secondary | ICD-10-CM | POA: Diagnosis not present

## 2022-04-30 DIAGNOSIS — E7800 Pure hypercholesterolemia, unspecified: Secondary | ICD-10-CM | POA: Diagnosis not present

## 2022-04-30 DIAGNOSIS — I10 Essential (primary) hypertension: Secondary | ICD-10-CM | POA: Diagnosis not present

## 2022-04-30 DIAGNOSIS — Z79899 Other long term (current) drug therapy: Secondary | ICD-10-CM | POA: Diagnosis not present

## 2022-04-30 DIAGNOSIS — Z1211 Encounter for screening for malignant neoplasm of colon: Secondary | ICD-10-CM | POA: Diagnosis not present

## 2022-04-30 LAB — HM COLONOSCOPY

## 2022-05-07 ENCOUNTER — Other Ambulatory Visit: Payer: Self-pay | Admitting: Physician Assistant

## 2022-05-07 DIAGNOSIS — E782 Mixed hyperlipidemia: Secondary | ICD-10-CM

## 2022-05-20 ENCOUNTER — Other Ambulatory Visit: Payer: Self-pay | Admitting: Family Medicine

## 2022-05-20 DIAGNOSIS — L650 Telogen effluvium: Secondary | ICD-10-CM

## 2022-05-21 DIAGNOSIS — H02831 Dermatochalasis of right upper eyelid: Secondary | ICD-10-CM | POA: Diagnosis not present

## 2022-05-21 DIAGNOSIS — H26491 Other secondary cataract, right eye: Secondary | ICD-10-CM | POA: Diagnosis not present

## 2022-05-21 DIAGNOSIS — H26492 Other secondary cataract, left eye: Secondary | ICD-10-CM | POA: Diagnosis not present

## 2022-05-21 DIAGNOSIS — H26493 Other secondary cataract, bilateral: Secondary | ICD-10-CM | POA: Diagnosis not present

## 2022-05-21 DIAGNOSIS — D3131 Benign neoplasm of right choroid: Secondary | ICD-10-CM | POA: Diagnosis not present

## 2022-05-21 DIAGNOSIS — H18413 Arcus senilis, bilateral: Secondary | ICD-10-CM | POA: Diagnosis not present

## 2022-05-21 DIAGNOSIS — Z961 Presence of intraocular lens: Secondary | ICD-10-CM | POA: Diagnosis not present

## 2022-05-21 DIAGNOSIS — H04123 Dry eye syndrome of bilateral lacrimal glands: Secondary | ICD-10-CM | POA: Diagnosis not present

## 2022-05-22 ENCOUNTER — Other Ambulatory Visit: Payer: Self-pay | Admitting: Family Medicine

## 2022-05-22 MED ORDER — FINASTERIDE 5 MG PO TABS: 5.0000 mg | ORAL_TABLET | Freq: Every day | ORAL | 1 refills | Status: DC

## 2022-05-31 HISTORY — PX: EYE SURGERY: SHX253

## 2022-06-07 ENCOUNTER — Encounter: Payer: Self-pay | Admitting: Family Medicine

## 2022-06-07 ENCOUNTER — Ambulatory Visit (INDEPENDENT_AMBULATORY_CARE_PROVIDER_SITE_OTHER): Payer: Medicare HMO | Admitting: Family Medicine

## 2022-06-07 VITALS — BP 124/72 | HR 72 | Temp 97.1°F | Resp 14 | Ht 65.0 in | Wt 178.0 lb

## 2022-06-07 DIAGNOSIS — F5101 Primary insomnia: Secondary | ICD-10-CM | POA: Diagnosis not present

## 2022-06-07 DIAGNOSIS — E782 Mixed hyperlipidemia: Secondary | ICD-10-CM | POA: Diagnosis not present

## 2022-06-07 DIAGNOSIS — L650 Telogen effluvium: Secondary | ICD-10-CM

## 2022-06-07 DIAGNOSIS — R7303 Prediabetes: Secondary | ICD-10-CM | POA: Diagnosis not present

## 2022-06-07 DIAGNOSIS — D6869 Other thrombophilia: Secondary | ICD-10-CM

## 2022-06-07 DIAGNOSIS — R10811 Right upper quadrant abdominal tenderness: Secondary | ICD-10-CM | POA: Diagnosis not present

## 2022-06-07 DIAGNOSIS — K5080 Crohn's disease of both small and large intestine without complications: Secondary | ICD-10-CM

## 2022-06-07 DIAGNOSIS — G720 Drug-induced myopathy: Secondary | ICD-10-CM | POA: Diagnosis not present

## 2022-06-07 DIAGNOSIS — I4819 Other persistent atrial fibrillation: Secondary | ICD-10-CM

## 2022-06-07 DIAGNOSIS — Z6829 Body mass index (BMI) 29.0-29.9, adult: Secondary | ICD-10-CM

## 2022-06-07 NOTE — Progress Notes (Unsigned)
Subjective:  Patient ID: Brittany [name], female    DOB: January 27, 1966  Age: 57 y.o. MRN: [id number]  Chief Complaint  Patient presents with   Hypertension   Hyperlipidemia   Diabetes    HPI Afib: Taking Xarelto 20 mg daily.   Hyperlipidemia: Takes Zetia 10 mg daily, Fish oil 1000 mg  in the morning and at bedtime.  Hair loss: Finasteride 1 mg daily.   Insomnia: Zolpidem 5 mg at bedtime.  Crohn's disease: Stelara 90 mg/ml injection every 8 weeks.  Chronic back pain with some sciatica. On gabapentin 300 mg 2 before bed.          11/20/2021    7:54 AM 08/25/2020    9:05 AM 06/10/2020    2:34 PM 02/17/2020    8:25 AM 02/17/2020    8:12 AM  Depression screen PHQ 2/9  Decreased Interest 0 0 0 0 0  Down, Depressed, Hopeless 0 0 0 0 0  PHQ - 2 Score 0 0 0 0 0         04/01/2021   10:04 AM 07/03/2021    8:13 AM 08/08/2021    8:06 AM 11/20/2021    7:54 AM 01/30/2022    5:19 PM  Fall Risk  Falls in the past year?   0 0   Was there an injury with Fall?   0 0   Fall Risk Category Calculator   0 0   Fall Risk Category (Retired)   Low Low   (RETIRED) Patient Fall Risk Level Low fall risk Low fall risk  Low fall risk Low fall risk  Patient at Risk for Falls Due to    No Fall Risks   Fall risk Follow up   Falls evaluation completed Falls evaluation completed       Review of Systems  Constitutional:  Negative for chills, fatigue and fever.  HENT:  Negative for congestion, rhinorrhea and sore throat.   Respiratory:  Negative for cough and shortness of breath.   Cardiovascular:  Negative for chest pain.  Gastrointestinal:  Negative for abdominal pain, constipation, diarrhea, nausea and vomiting.  Genitourinary:  Negative for dysuria and urgency.  Musculoskeletal:  Positive for arthralgias, back pain and myalgias.  Neurological:  Negative for dizziness, weakness, light-headedness and headaches.  Psychiatric/Behavioral:  Negative for dysphoric mood. The patient is not  nervous/anxious.     Current Outpatient Medications on File Prior to Visit  Medication Sig Dispense Refill   calcium-vitamin D (OSCAL WITH D) 500-200 MG-UNIT per tablet Take 1 tablet by mouth daily.     estradiol (ESTRACE) 1 MG tablet TAKE 1 TABLET EVERY DAY 90 tablet 3   ezetimibe (ZETIA) 10 MG tablet TAKE 1 TABLET (10 MG TOTAL) BY MOUTH DAILY. 90 tablet 0   finasteride (PROSCAR) 5 MG tablet Take 1 tablet (5 mg total) by mouth daily. 90 tablet 1   fish oil-omega-3 fatty acids 1000 MG capsule Take 1 g by mouth in the morning and at bedtime.     gabapentin (NEURONTIN) 300 MG capsule Take 2 capsules (600 mg total) by mouth at bedtime. 180 capsule 1   Glucosamine-Chondroit-Vit C-Mn (GLUCOSAMINE 1500 COMPLEX PO) Take 2 tablets by mouth at bedtime.     rivaroxaban (XARELTO) 20 MG TABS tablet TAKE 1 TABLET BY MOUTH ONCE DAILY WITH SUPPER 90 tablet 1   ustekinumab (STELARA) 90 MG/ML SOSY injection Inject 90 mg into the skin every 8 (eight) weeks.     zolpidem (AMBIEN) 5 MG tablet  TAKE 1 TABLET BY MOUTH EVERY DAY AT BEDTIME AS NEEDED FOR SLEEP 90 tablet 0   No current facility-administered medications on file prior to visit.   Past Medical History:  Diagnosis Date   BMI 26.0-26.9,adult 07/06/2019   Bradycardia 08/10/2014   Crohn's disease ([place])    DDD (degenerative disc disease), lumbar 07/17/2017   Diabetes mellitus without complication ([place])    Fatigue 08/10/2014   Hyperlipidemia    Hyperlipidemia associated with type 2 diabetes mellitus ([place]) 07/06/2019   Hypertension    Hypertension, essential    Mixed hyperlipidemia    Palpitations 07/06/2019   Paresthesia 07/06/2019   Past Surgical History:  Procedure Laterality Date   ABDOMINAL HYSTERECTOMY N/A 10/20/2012   Procedure: HYSTERECTOMY ABDOMINAL;  Surgeon: [name], MD;  Location: [facility];  Service: Gynecology;  Laterality: N/A;   ANAL FISSURE REPAIR  2002   APPENDECTOMY     CARDIOVERSION N/A 07/03/2021   Procedure: CARDIOVERSION;  Surgeon:  Donato Heinz, MD;  Location: [facility];  Service: Cardiovascular;  Laterality: N/A;   SALPINGOOPHORECTOMY Bilateral 10/20/2012   Procedure: SALPINGO OOPHORECTOMY;  Surgeon: [name], MD;  Location: [facility];  Service: Gynecology;  Laterality: Bilateral;   SMALL INTESTINE SURGERY     reconstruction   TONSILLECTOMY      Family History  Problem Relation Age of Onset   Heart Problems Mother    Diabetes Mother    Diabetes Father    Heart Problems Father    Cancer Maternal Grandmother        lung   Peptic Ulcer Disease Other    Social History   Socioeconomic History   Marital status: Widowed    Spouse name: Not on file   Number of children: 1   Years of education: Not on file   Highest education level: Not on file  Occupational History   Not on file  Tobacco Use   Smoking status: Former    Types: Cigarettes    Quit date: 2002    Years since quitting: 22.0   Smokeless tobacco: Never   Tobacco comments:    Former smoker 02/05/22  Vaping Use   Vaping Use: Never used  Substance and Sexual Activity   Alcohol use: Yes    Alcohol/week: 1.0 standard drink of alcohol    Types: 1 Standard drinks or equivalent per week    Comment: 1 drink per month 02/05/22   Drug use: No   Sexual activity: Not on file  Other Topics Concern   Not on file  Social History Narrative   wears sunscreen, brushes and flosses daily, see's dentist bi-annually, has smoke/carbon monoxide detectors, wears a seatbelt and practices gun safety   Social Determinants of Health   Financial Resource Strain: Low Risk  (07/06/2021)   Overall Financial Resource Strain (CARDIA)    Difficulty of Paying Living Expenses: Not hard at all  Food Insecurity: No Food Insecurity (11/20/2021)   Hunger Vital Sign    Worried About Running Out of Food in the Last Year: Never true    [place] in the Last Year: Never true  Transportation Needs: No Transportation Needs (07/06/2021)   PRAPARE - Armed forces logistics/support/administrative officer (Medical): No    Lack of Transportation (Non-Medical): No  Physical Activity: Inactive (11/20/2021)   Exercise Vital Sign    Days of Exercise per Week: 0 days    Minutes of Exercise per Session: 0 min  Stress: No Stress Concern Present (11/20/2021)  [employer] - Occupational Stress Questionnaire    Feeling of Stress : Not at all  Social Connections: Moderately Integrated (11/20/2021)   Social Connection and Isolation Panel [NHANES]    Frequency of Communication with Friends and Family: More than three times a week    Frequency of Social Gatherings with Friends and Family: More than three times a week    Attends Religious Services: More than 4 times per year    Active Member of [employer] or Organizations: No    Attends Music therapist: Never    Marital Status: Married    Objective:  BP 124/72   Pulse 72   Temp (!) 97.1 F (36.2 C)   Resp 14   Ht '5\' 5"'$  (1.651 m)   Wt 178 lb (80.7 kg)   LMP 09/23/2012   BMI 29.62 kg/m      06/07/2022   11:18 AM 02/26/2022   11:18 AM 02/21/2022    8:12 AM  BP/Weight  Systolic BP 353 299 242  Diastolic BP 72 76 70  Wt. (Lbs) 178 179.8 181  BMI 29.62 kg/m2 29.92 kg/m2 30.12 kg/m2    Physical Exam  Diabetic Foot Exam - Simple   No data filed      Lab Results  Component Value Date   WBC 5.1 02/21/2022   HGB 13.5 02/21/2022   HCT 41.7 02/21/2022   PLT 191 02/21/2022   GLUCOSE 89 02/21/2022   CHOL 212 (H) 02/21/2022   TRIG 121 02/21/2022   HDL 83 02/21/2022   LDLCALC 108 (H) 02/21/2022   ALT 13 02/21/2022   AST 17 02/21/2022   NA 141 02/21/2022   K 4.3 02/21/2022   CL 103 02/21/2022   CREATININE 0.73 02/21/2022   BUN 13 02/21/2022   CO2 25 02/21/2022   TSH 1.721 01/30/2022   INR 0.86 10/20/2012   HGBA1C 5.4 02/21/2022   MICROALBUR 30 08/25/2020      Assessment & Plan:    Persistent atrial fibrillation (HCC)  Drug-induced myopathy  Prediabetes -      Hemoglobin A1c -     Microalbumin / creatinine urine ratio  Mixed hyperlipidemia -     CBC with Differential/Platelet -     Comprehensive metabolic panel -     Lipid panel  [place] effluvium     No orders of the defined types were placed in this encounter.   Orders Placed This Encounter  Procedures   CBC with Differential/Platelet   Comprehensive metabolic panel   Lipid panel   Hemoglobin A1c   Microalbumin / creatinine urine ratio     Follow-up: No follow-ups on file.  An After Visit Summary was printed and given to the patient.  [name], MD [name] [phone]

## 2022-06-07 NOTE — Progress Notes (Unsigned)
Subjective:  Patient ID: Brittany [name], female    DOB: 04-15-1966  Age: 57 y.o. MRN: [id number]  No chief complaint on file.   HPI Afib: Taking Xarelto 20 mg daily.  Hyperlipidemia: Takes Zetia 10 mg daily, Fish oil 1000 mg  in the morning and at bedtime.  Hair loss: Finasteride 1 mg daily. Helping some.   Insomnia: Zolpidem 5 mg at bedtime.  Crohn's disease: Stelara 90 mg/ml injection every 8 weeks.  Chronic back pain with some sciatica. On gabapentin 300 mg 2 before bed.   Complaining of abdominal ruq radiating to back. Came on when she was taking her prep for her colonoscopy. Since then, Is happening 3 hours after eating. Nausea. No vomiting.  No dysuria/hematuria.      11/20/2021    7:54 AM 08/25/2020    9:05 AM 06/10/2020    2:34 PM 02/17/2020    8:25 AM 02/17/2020    8:12 AM  Depression screen PHQ 2/9  Decreased Interest 0 0 0 0 0  Down, Depressed, Hopeless 0 0 0 0 0  PHQ - 2 Score 0 0 0 0 0         04/01/2021   10:04 AM 07/03/2021    8:13 AM 08/08/2021    8:06 AM 11/20/2021    7:54 AM 01/30/2022    5:19 PM  Fall Risk  Falls in the past year?   0 0   Was there an injury with Fall?   0 0   Fall Risk Category Calculator   0 0   Fall Risk Category (Retired)   Low Low   (RETIRED) Patient Fall Risk Level Low fall risk Low fall risk  Low fall risk Low fall risk  Patient at Risk for Falls Due to    No Fall Risks   Fall risk Follow up   Falls evaluation completed Falls evaluation completed       Review of Systems  Constitutional:  Negative for chills, fatigue and fever.  HENT:  Negative for congestion, ear pain and sore throat.   Respiratory:  Negative for cough and shortness of breath.   Cardiovascular:  Negative for chest pain.  Gastrointestinal:  Positive for abdominal pain and nausea. Negative for constipation, diarrhea and vomiting.  Genitourinary:  Negative for dysuria and urgency.  Musculoskeletal:  Negative for arthralgias and myalgias.  Skin:   Negative for rash.  Neurological:  Negative for dizziness and headaches.  Psychiatric/Behavioral:  Negative for dysphoric mood. The patient is not nervous/anxious.     Current Outpatient Medications on File Prior to Visit  Medication Sig Dispense Refill   calcium-vitamin D (OSCAL WITH D) 500-200 MG-UNIT per tablet Take 1 tablet by mouth daily.     estradiol (ESTRACE) 1 MG tablet TAKE 1 TABLET EVERY DAY 90 tablet 3   ezetimibe (ZETIA) 10 MG tablet TAKE 1 TABLET (10 MG TOTAL) BY MOUTH DAILY. 90 tablet 0   finasteride (PROSCAR) 5 MG tablet Take 1 tablet (5 mg total) by mouth daily. 90 tablet 1   fish oil-omega-3 fatty acids 1000 MG capsule Take 1 g by mouth in the morning and at bedtime.     gabapentin (NEURONTIN) 300 MG capsule Take 2 capsules (600 mg total) by mouth at bedtime. 180 capsule 1   Glucosamine-Chondroit-Vit C-Mn (GLUCOSAMINE 1500 COMPLEX PO) Take 2 tablets by mouth at bedtime.     rivaroxaban (XARELTO) 20 MG TABS tablet TAKE 1 TABLET BY MOUTH ONCE DAILY WITH SUPPER 90 tablet 1  ustekinumab (STELARA) 90 MG/ML SOSY injection Inject 90 mg into the skin every 8 (eight) weeks.     zolpidem (AMBIEN) 5 MG tablet TAKE 1 TABLET BY MOUTH EVERY DAY AT BEDTIME AS NEEDED FOR SLEEP 90 tablet 0   No current facility-administered medications on file prior to visit.   Past Medical History:  Diagnosis Date   BMI 26.0-26.9,adult 07/06/2019   Bradycardia 08/10/2014   Crohn's disease ([place])    DDD (degenerative disc disease), lumbar 07/17/2017   Diabetes mellitus without complication ([place])    Fatigue 08/10/2014   Hyperlipidemia    Hyperlipidemia associated with type 2 diabetes mellitus ([place]) 07/06/2019   Hypertension    Hypertension, essential    Mixed hyperlipidemia    Palpitations 07/06/2019   Paresthesia 07/06/2019   Past Surgical History:  Procedure Laterality Date   ABDOMINAL HYSTERECTOMY N/A 10/20/2012   Procedure: HYSTERECTOMY ABDOMINAL;  Surgeon: [name], MD;  Location: [facility];   Service: Gynecology;  Laterality: N/A;   ANAL FISSURE REPAIR  2002   APPENDECTOMY     CARDIOVERSION N/A 07/03/2021   Procedure: CARDIOVERSION;  Surgeon: Donato Heinz, MD;  Location: [facility];  Service: Cardiovascular;  Laterality: N/A;   SALPINGOOPHORECTOMY Bilateral 10/20/2012   Procedure: SALPINGO OOPHORECTOMY;  Surgeon: [name], MD;  Location: [facility];  Service: Gynecology;  Laterality: Bilateral;   SMALL INTESTINE SURGERY     reconstruction   TONSILLECTOMY      Family History  Problem Relation Age of Onset   Heart Problems Mother    Diabetes Mother    Diabetes Father    Heart Problems Father    Cancer Maternal Grandmother        lung   Peptic Ulcer Disease Other    Social History   Socioeconomic History   Marital status: Widowed    Spouse name: Not on file   Number of children: 1   Years of education: Not on file   Highest education level: Not on file  Occupational History   Not on file  Tobacco Use   Smoking status: Former    Types: Cigarettes    Quit date: 2002    Years since quitting: 22.0   Smokeless tobacco: Never   Tobacco comments:    Former smoker 02/05/22  Vaping Use   Vaping Use: Never used  Substance and Sexual Activity   Alcohol use: Yes    Alcohol/week: 1.0 standard drink of alcohol    Types: 1 Standard drinks or equivalent per week    Comment: 1 drink per month 02/05/22   Drug use: No   Sexual activity: Not on file  Other Topics Concern   Not on file  Social History Narrative   wears sunscreen, brushes and flosses daily, see's dentist bi-annually, has smoke/carbon monoxide detectors, wears a seatbelt and practices gun safety   Social Determinants of Health   Financial Resource Strain: Low Risk  (07/06/2021)   Overall Financial Resource Strain (CARDIA)    Difficulty of Paying Living Expenses: Not hard at all  Food Insecurity: No Food Insecurity (11/20/2021)   Hunger Vital Sign    Worried About Running Out of Food in the Last  Year: Never true    [place] in the Last Year: Never true  Transportation Needs: No Transportation Needs (07/06/2021)   PRAPARE - Hydrologist (Medical): No    Lack of Transportation (Non-Medical): No  Physical Activity: Inactive (11/20/2021)   Exercise Vital Sign  Days of Exercise per Week: 0 days    Minutes of Exercise per Session: 0 min  Stress: No Stress Concern Present (11/20/2021)   [place]    Feeling of Stress : Not at all  Social Connections: Moderately Integrated (11/20/2021)   Social Connection and Isolation Panel [NHANES]    Frequency of Communication with Friends and Family: More than three times a week    Frequency of Social Gatherings with Friends and Family: More than three times a week    Attends Religious Services: More than 4 times per year    Active Member of Clubs or Organizations: No    Attends Archivist Meetings: Never    Marital Status: Married    Objective:  LMP 09/23/2012      02/26/2022   11:18 AM 02/21/2022    8:12 AM 02/05/2022   11:08 AM  BP/Weight  Systolic BP 161 096 045  Diastolic BP 76 70 80  Wt. (Lbs) 179.8 181 179.8  BMI 29.92 kg/m2 30.12 kg/m2 29.92 kg/m2    Physical Exam  Diabetic Foot Exam - Simple   No data filed      Lab Results  Component Value Date   WBC 5.1 02/21/2022   HGB 13.5 02/21/2022   HCT 41.7 02/21/2022   PLT 191 02/21/2022   GLUCOSE 89 02/21/2022   CHOL 212 (H) 02/21/2022   TRIG 121 02/21/2022   HDL 83 02/21/2022   LDLCALC 108 (H) 02/21/2022   ALT 13 02/21/2022   AST 17 02/21/2022   NA 141 02/21/2022   K 4.3 02/21/2022   CL 103 02/21/2022   CREATININE 0.73 02/21/2022   BUN 13 02/21/2022   CO2 25 02/21/2022   TSH 1.721 01/30/2022   INR 0.86 10/20/2012   HGBA1C 5.4 02/21/2022   MICROALBUR 30 08/25/2020      Assessment & Plan:    There are no diagnoses linked to this encounter.   No  orders of the defined types were placed in this encounter.   No orders of the defined types were placed in this encounter.    Follow-up: No follow-ups on file.  An After Visit Summary was printed and given to the patient.  [name], MD [facility] [phone]

## 2022-06-08 NOTE — Assessment & Plan Note (Signed)
Intolerant to statins. 

## 2022-06-08 NOTE — Assessment & Plan Note (Signed)
Well controlled.  No changes to medicines. Takes Zetia 10 mg daily, Fish oil 1000 mg in the morning and at bedtime.  Continue to work on eating a healthy diet and exercise.  Labs drawn today.

## 2022-06-08 NOTE — Assessment & Plan Note (Signed)
Start finasteride 5 mg daily.

## 2022-06-08 NOTE — Assessment & Plan Note (Signed)
Hemoglobin A1c 5.4%, 3 month avg of blood sugars, is in prediabetic range.  In order to prevent progression to diabetes, recommend low carb diet and regular exercise

## 2022-06-08 NOTE — Assessment & Plan Note (Signed)
Management per specialist. 

## 2022-06-09 DIAGNOSIS — D6869 Other thrombophilia: Secondary | ICD-10-CM | POA: Insufficient documentation

## 2022-06-09 DIAGNOSIS — R10811 Right upper quadrant abdominal tenderness: Secondary | ICD-10-CM | POA: Insufficient documentation

## 2022-06-09 NOTE — Assessment & Plan Note (Signed)
Recommend continue to work on eating healthy diet and exercise.  

## 2022-06-09 NOTE — Assessment & Plan Note (Addendum)
GB diet.  Check RUQ [place]. Order biliary scan if [place] normal.

## 2022-06-09 NOTE — Assessment & Plan Note (Signed)
The current medical regimen is effective;  continue present plan and medications. Continue zolpidem 5 mg one before bed.

## 2022-06-09 NOTE — Assessment & Plan Note (Signed)
Continue xarelto 20 mg daily. Needed for atrial fibrillation.

## 2022-06-09 NOTE — Assessment & Plan Note (Signed)
Well controlled.  The current medical regimen is effective;  continue present plan and medications. Management per specialist.

## 2022-06-10 LAB — LIPID PANEL
Chol/HDL Ratio: 2.7 ratio (ref 0.0–4.4)
Cholesterol, Total: 234 mg/dL — ABNORMAL HIGH (ref 100–199)
HDL: 86 mg/dL (ref >39)
LDL Chol Calc (NIH): 122 mg/dL — ABNORMAL HIGH (ref 0–99)
Triglycerides: 151 mg/dL — ABNORMAL HIGH (ref 0–149)
VLDL Cholesterol Cal: 26 mg/dL (ref 5–40)

## 2022-06-10 LAB — CARDIOVASCULAR RISK ASSESSMENT

## 2022-06-10 LAB — CBC WITH DIFFERENTIAL/PLATELET
Basophils Absolute: 0.1 10*3/uL (ref 0.0–0.2)
Basos: 1 %
EOS (ABSOLUTE): 0 10*3/uL (ref 0.0–0.4)
Eos: 0 %
Hematocrit: 45.6 % (ref 34.0–46.6)
Hemoglobin: 15.1 g/dL (ref 11.1–15.9)
Immature Grans (Abs): 0 10*3/uL (ref 0.0–0.1)
Immature Granulocytes: 0 %
Lymphocytes Absolute: 1.6 10*3/uL (ref 0.7–3.1)
Lymphs: 26 %
MCH: 29.1 pg (ref 26.6–33.0)
MCHC: 33.1 g/dL (ref 31.5–35.7)
MCV: 88 fL (ref 79–97)
Monocytes Absolute: 0.3 10*3/uL (ref 0.1–0.9)
Monocytes: 5 %
Neutrophils Absolute: 4.2 10*3/uL (ref 1.4–7.0)
Neutrophils: 68 %
Platelets: 206 10*3/uL (ref 150–450)
RBC: 5.19 x10E6/uL (ref 3.77–5.28)
RDW: 12.3 % (ref 11.7–15.4)
WBC: 6.3 10*3/uL (ref 3.4–10.8)

## 2022-06-10 LAB — COMPREHENSIVE METABOLIC PANEL
ALT: 14 IU/L (ref 0–32)
AST: 16 IU/L (ref 0–40)
Albumin/Globulin Ratio: 1.8 (ref 1.2–2.2)
Albumin: 4.4 g/dL (ref 3.8–4.9)
Alkaline Phosphatase: 58 IU/L (ref 44–121)
BUN/Creatinine Ratio: 21 (ref 9–23)
BUN: 16 mg/dL (ref 6–24)
Bilirubin Total: 0.5 mg/dL (ref 0.0–1.2)
CO2: 25 mmol/L (ref 20–29)
Calcium: 9.6 mg/dL (ref 8.7–10.2)
Chloride: 101 mmol/L (ref 96–106)
Creatinine, Ser: 0.78 mg/dL (ref 0.57–1.00)
Globulin, Total: 2.4 g/dL (ref 1.5–4.5)
Glucose: 95 mg/dL (ref 70–99)
Potassium: 4.4 mmol/L (ref 3.5–5.2)
Sodium: 140 mmol/L (ref 134–144)
Total Protein: 6.8 g/dL (ref 6.0–8.5)
eGFR: 88 mL/min/{1.73_m2} (ref >59)

## 2022-06-10 LAB — MICROALBUMIN / CREATININE URINE RATIO
Creatinine, Urine: 177.4 mg/dL
Microalb/Creat Ratio: 3 mg/g creat (ref 0–29)
Microalbumin, Urine: 6.1 ug/mL

## 2022-06-10 LAB — HEMOGLOBIN A1C
Est. average glucose Bld gHb Est-mCnc: 117 mg/dL
Hgb A1c MFr Bld: 5.7 % — ABNORMAL HIGH (ref 4.8–5.6)

## 2022-06-10 NOTE — Progress Notes (Signed)
Blood count normal.  Liver function normal.  Kidney function normal.  Cholesterol: Total cholesterol 234.  Recommend less than 200.  LDL too high.  Recommend less than 70. HBA1C: 5.7.  Slightly worse than last time at 5.4. Not spilling any protein in her urine

## 2022-06-11 DIAGNOSIS — K50919 Crohn's disease, unspecified, with unspecified complications: Secondary | ICD-10-CM | POA: Diagnosis not present

## 2022-06-13 ENCOUNTER — Ambulatory Visit (INDEPENDENT_AMBULATORY_CARE_PROVIDER_SITE_OTHER): Payer: Medicare HMO

## 2022-06-13 DIAGNOSIS — Z0000 Encounter for general adult medical examination without abnormal findings: Secondary | ICD-10-CM

## 2022-06-13 NOTE — Progress Notes (Signed)
Subjective:   Brittany [name] is a 57 y.o. female who presents for Medicare Annual (Subsequent) preventive examination. I connected with  [name] on 06/13/22 by a audio enabled telemedicine application and verified that I am speaking with the correct person using two identifiers.  Patient Location: Home  Provider Location: Office/Clinic  I discussed the limitations of evaluation and management by telemedicine. The patient expressed understanding and agreed to proceed.  Cardiac Risk Factors include: diabetes mellitus;hypertension     Objective:    There were no vitals filed for this visit. There is no height or weight on file to calculate BMI.     01/30/2022    5:28 PM 07/03/2021    8:06 AM 06/10/2020    2:33 PM 07/29/2017   10:13 AM 10/20/2012    4:35 PM 10/15/2012   12:11 PM  Advanced Directives  Does Patient Have a Medical Advance Directive? No No No No Patient does not have advance directive Patient does not have advance directive  Would patient like information on creating a medical advance directive?  No - Patient declined  No - Patient declined    Pre-existing out of facility DNR order (yellow form or pink MOST form)     No     Current Medications (verified) Outpatient Encounter Medications as of 06/13/2022  Medication Sig   calcium-vitamin D (OSCAL WITH D) 500-200 MG-UNIT per tablet Take 1 tablet by mouth daily.   estradiol (ESTRACE) 1 MG tablet TAKE 1 TABLET EVERY DAY   ezetimibe (ZETIA) 10 MG tablet TAKE 1 TABLET (10 MG TOTAL) BY MOUTH DAILY.   finasteride (PROSCAR) 5 MG tablet Take 1 tablet (5 mg total) by mouth daily.   fish oil-omega-3 fatty acids 1000 MG capsule Take 1 g by mouth in the morning and at bedtime.   gabapentin (NEURONTIN) 300 MG capsule Take 2 capsules (600 mg total) by mouth at bedtime.   Glucosamine-Chondroit-Vit C-Mn (GLUCOSAMINE 1500 COMPLEX PO) Take 2 tablets by mouth at bedtime.   rivaroxaban (XARELTO) 20 MG TABS tablet TAKE 1  TABLET BY MOUTH ONCE DAILY WITH SUPPER   ustekinumab (STELARA) 90 MG/ML SOSY injection Inject 90 mg into the skin every 8 (eight) weeks.   zolpidem (AMBIEN) 5 MG tablet TAKE 1 TABLET BY MOUTH EVERY DAY AT BEDTIME AS NEEDED FOR SLEEP   No facility-administered encounter medications on file as of 06/13/2022.    Allergies (verified) Mercaptopurine, Amitriptyline, Crestor [rosuvastatin calcium], Metoprolol, Trazodone and nefazodone, Zocor [simvastatin], Morphine and related, and Penicillins   History: Past Medical History:  Diagnosis Date   BMI 26.0-26.9,adult 07/06/2019   Bradycardia 08/10/2014   Crohn's disease ([place])    DDD (degenerative disc disease), lumbar 07/17/2017   Diabetes mellitus without complication ([place])    Fatigue 08/10/2014   Hyperlipidemia    Hyperlipidemia associated with type 2 diabetes mellitus ([place]) 07/06/2019   Hypertension    Hypertension, essential    Mixed hyperlipidemia    Palpitations 07/06/2019   Paresthesia 07/06/2019   Past Surgical History:  Procedure Laterality Date   ABDOMINAL HYSTERECTOMY N/A 10/20/2012   Procedure: HYSTERECTOMY ABDOMINAL;  Surgeon: [name], MD;  Location: [facility];  Service: Gynecology;  Laterality: N/A;   ANAL FISSURE REPAIR  05/13/2000   APPENDECTOMY     CARDIOVERSION N/A 07/03/2021   Procedure: CARDIOVERSION;  Surgeon: Donato Heinz, MD;  Location: [facility];  Service: Cardiovascular;  Laterality: N/A;   CATARACT EXTRACTION Bilateral    EYE SURGERY  05/31/2022   removed  film   SALPINGOOPHORECTOMY Bilateral 10/20/2012   Procedure: SALPINGO OOPHORECTOMY;  Surgeon: [name], MD;  Location: [facility];  Service: Gynecology;  Laterality: Bilateral;   SMALL INTESTINE SURGERY     reconstruction   TONSILLECTOMY     Family History  Problem Relation Age of Onset   Heart Problems Mother    Diabetes Mother    Diabetes Father    Heart Problems Father    Cancer Maternal Grandmother        lung   Peptic Ulcer Disease Other     Social History   Socioeconomic History   Marital status: Widowed    Spouse name: Not on file   Number of children: 1   Years of education: Not on file   Highest education level: Not on file  Occupational History   Not on file  Tobacco Use   Smoking status: Former    Types: Cigarettes    Quit date: 2002    Years since quitting: 22.0   Smokeless tobacco: Never   Tobacco comments:    Former smoker 02/05/22  Vaping Use   Vaping Use: Never used  Substance and Sexual Activity   Alcohol use: Yes    Alcohol/week: 1.0 standard drink of alcohol    Types: 1 Standard drinks or equivalent per week    Comment: 1 drink per month 02/05/22   Drug use: No   Sexual activity: Not on file  Other Topics Concern   Not on file  Social History Narrative   wears sunscreen, brushes and flosses daily, see's dentist bi-annually, has smoke/carbon monoxide detectors, wears a seatbelt and practices gun safety   Social Determinants of Health   Financial Resource Strain: Low Risk  (06/12/2022)   Overall Financial Resource Strain (CARDIA)    Difficulty of Paying Living Expenses: Not very hard  Food Insecurity: No Food Insecurity (06/12/2022)   Hunger Vital Sign    Worried About Running Out of Food in the Last Year: Never true    Ran Out of Food in the Last Year: Never true  Transportation Needs: No Transportation Needs (06/12/2022)   PRAPARE - Hydrologist (Medical): No    Lack of Transportation (Non-Medical): No  Physical Activity: Insufficiently Active (06/12/2022)   Exercise Vital Sign    Days of Exercise per Week: 2 days    Minutes of Exercise per Session: 20 min  Stress: No Stress Concern Present (06/12/2022)   [place]    Feeling of Stress : Only a little  Social Connections: Moderately Integrated (06/12/2022)   Social Connection and Isolation Panel [NHANES]    Frequency of Communication with  Friends and Family: More than three times a week    Frequency of Social Gatherings with Friends and Family: More than three times a week    Attends Religious Services: More than 4 times per year    Active Member of [employer] or Organizations: Yes    Attends Archivist Meetings: 1 to 4 times per year    Marital Status: Widowed    Tobacco Counseling Counseling given: Not Answered Tobacco comments: Former smoker 02/05/22   Clinical Intake:  Pre-visit preparation completed: Yes Pain : No/denies pain   BMI - recorded: 29.62 Nutritional Status: BMI 25 -29 Overweight Nutritional Risks: None Diabetes: Yes (most recent A1C 5.7) How often do you need to have someone help you when you read instructions, pamphlets, or other written materials from your  doctor or pharmacy?: 2 - Rarely Interpreter Needed?: No    Activities of Daily Living    06/12/2022    2:44 PM 11/20/2021    7:54 AM  In your present state of health, do you have any difficulty performing the following activities:  Hearing? 0 0  Vision? 0 0  Difficulty concentrating or making decisions? 0 0  Walking or climbing stairs? 1 0  Dressing or bathing? 0 0  Doing errands, shopping? 0 0  Preparing Food and eating ? N   Using the Toilet? N   In the past six months, have you accidently leaked urine? N   Do you have problems with loss of bowel control? Y   Managing your Medications? N   Managing your Finances? N   Housekeeping or managing your Housekeeping? N     Patient Care Team: CoxElnita Maxwell, MD as PCP - General (Family Medicine) [name], MD as PCP - Electrophysiology (Cardiology) [name], [facility] (Pharmacist)  Indicate any recent Medical Services you may have received from other than [facility] providers in the past year (date may be approximate). [facility]    Assessment:   This is a routine wellness examination for [place].  Hearing/Vision screen No results found.  Dietary issues and exercise  activities discussed: Current Exercise Habits: Home exercise routine, Type of exercise: walking;strength training/weights, Time (Minutes): 30, Frequency (Times/Week): 5, Weekly Exercise (Minutes/Week): 150, Intensity: Mild, Exercise limited by: None identified  Depression Screen    06/13/2022    1:37 PM 11/20/2021    7:54 AM 08/25/2020    9:05 AM 06/10/2020    2:34 PM 02/17/2020    8:25 AM 02/17/2020    8:12 AM 09/15/2019    9:32 AM  PHQ 2/9 Scores  PHQ - 2 Score 0 0 0 0 0 0 0    Fall Risk    06/12/2022    2:44 PM 11/20/2021    7:54 AM 08/08/2021    8:06 AM 08/25/2020    9:05 AM  Fall Risk   Falls in the past year? 0 0 0 0  Number falls in past yr: 0 0 0 0  Injury with Fall? 0 0 0 0  Risk for fall due to : No Fall Risks No Fall Risks  No Fall Risks  Follow up Falls evaluation completed;Education provided Falls evaluation completed Falls evaluation completed Falls evaluation completed    [place]:  Home free of loose throw rugs in walkways, pet beds, electrical cords, etc? Yes  Adequate lighting in your home to reduce risk of falls? Yes   ASSISTIVE DEVICES UTILIZED TO PREVENT FALLS:  Life alert? No  Use of a cane, walker or w/c? No   Cognitive Function:        06/13/2022    1:41 PM  6CIT Screen  What Year? 0 points  What month? 0 points  What time? 0 points  Count back from 20 0 points  Months in reverse 0 points  Repeat phrase 0 points  Total Score 0 points    Immunizations Immunization History  Administered Date(s) Administered   Influenza Inj Mdck Quad Pf 02/17/2020, 01/02/2021, 02/21/2022   Influenza,inj,Quad PF,6+ Mos 02/17/2020   Influenza-Unspecified 06/03/2019, 02/17/2020   PFIZER(Purple Top)SARS-COV-2 Vaccination 07/30/2019, 08/25/2019   Pneumococcal Conjugate-13 11/01/2015   Pneumococcal Polysaccharide-23 03/15/2013    TDAP status: Due, Education has been provided regarding the importance of this vaccine. Advised may  receive this vaccine at local  pharmacy or Health Dept. Aware to provide a copy of the vaccination record if obtained from local pharmacy or Health Dept. Verbalized acceptance and understanding.  Flu Vaccine status: Up to date  Pneumococcal vaccine status: Up to date  Covid-19 vaccine status: Declined, Education has been provided regarding the importance of this vaccine but patient still declined. Advised may receive this vaccine at local pharmacy or Health Dept.or vaccine clinic. Aware to provide a copy of the vaccination record if obtained from local pharmacy or Health Dept. Verbalized acceptance and understanding.  Qualifies for Shingles Vaccine? Yes   Zostavax completed No   Shingrix Completed?: No.    Education has been provided regarding the importance of this vaccine. Patient has been advised to call insurance company to determine out of pocket expense if they have not yet received this vaccine. Advised may also receive vaccine at local pharmacy or Health Dept. Verbalized acceptance and understanding.  Screening Tests Health Maintenance  Topic Date Due   DTaP/Tdap/Td (1 - Tdap) Never done   Zoster Vaccines- Shingrix (1 of 2) Never done   COVID-19 Vaccine (3 - Pfizer risk series) 09/22/2019   Medicare Annual Wellness (AWV)  06/06/2021   OPHTHALMOLOGY EXAM  02/24/2022   FOOT EXAM  08/09/2022   HEMOGLOBIN A1C  12/06/2022   Diabetic kidney evaluation - eGFR measurement  06/08/2023   Diabetic kidney evaluation - Urine ACR  06/08/2023   MAMMOGRAM  10/18/2023   COLONOSCOPY (Pts 45-16yr Insurance coverage will need to be confirmed)  05/01/2027   INFLUENZA VACCINE  Completed   Hepatitis C Screening  Completed   HPV VACCINES  Aged Out   HIV Screening  Discontinued    Health Maintenance  Health Maintenance Due  Topic Date Due   DTaP/Tdap/Td (1 - Tdap) Never done   Zoster Vaccines- Shingrix (1 of 2) Never done   COVID-19 Vaccine (3 - Pfizer risk series) 09/22/2019   Medicare Annual  Wellness (AWV)  06/06/2021   OPHTHALMOLOGY EXAM  02/24/2022    Colorectal cancer screening: Type of screening: Colonoscopy. Completed 04/2022. Repeat every 5 years  Mammogram status: Completed 10/2021. Repeat every year  Lung Cancer Screening: (Low Dose CT Chest recommended if Age 59-80years, 30 pack-year currently smoking OR have quit w/in 15years.) does not qualify.   Lung Cancer Screening Referral: N/A  Additional Screening:  Hepatitis C Screening: does qualify; Completed 2020  Vision Screening: Recommended annual ophthalmology exams for early detection of glaucoma and other disorders of the eye. Is the patient up to date with their annual eye exam?  Yes  Who is the provider or what is the name of the office in which the patient attends annual eye exams? [facility]: Recommended annual dental exams for proper oral hygiene  Community Resource Referral / Chronic Care Management: CRR required this visit?  No   CCM required this visit?  No      Plan:     I have personally reviewed and noted the following in the patient's chart:   Medical and social history Use of alcohol, tobacco or illicit drugs  Current medications and supplements including opioid prescriptions. Patient is not currently taking opioid prescriptions. Functional ability and status Nutritional status Physical activity Advanced directives List of other physicians Hospitalizations, surgeries, and ER visits in previous 12 months Vitals Screenings to include cognitive, depression, and falls Referrals and appointments  In addition, I have reviewed and discussed with patient certain preventive protocols, quality metrics, and best practice recommendations. A  written personalized care plan for preventive services as well as general preventive health recommendations were provided to patient.     Erie Noe, LPN   09/13/2990

## 2022-06-13 NOTE — Patient Instructions (Signed)
Brittany [name] , Thank you for taking time to come for your Medicare Wellness Visit. I appreciate your ongoing commitment to your health goals. Please review the following plan we discussed and let me know if I can assist you in the future.   Screening recommendations/referrals: Colonoscopy: up-to-date Mammogram: Due in June Recommended yearly ophthalmology/optometry visit for glaucoma screening and checkup Recommended yearly dental visit for hygiene and checkup  Vaccinations: Influenza vaccine: up-to-date Pneumococcal vaccine: up-to-date Tdap vaccine: Due - you can get this at the pharmacy Shingles vaccine: Due - you can get this at the pharmacy    Advanced directives: Please bring a copy for your medical record    Preventive Care 40-64 Years, Female Preventive care refers to lifestyle choices and visits with your health care provider that can promote health and wellness. What does preventive care include? A yearly physical exam. This is also called an annual well check. Dental exams once or twice a year. Routine eye exams. Ask your health care provider how often you should have your eyes checked. Personal lifestyle choices, including: Daily care of your teeth and gums. Regular physical activity. Eating a healthy diet. Avoiding tobacco and drug use. Limiting alcohol use. Practicing safe sex. Taking low-dose aspirin daily starting at age 73. Taking vitamin and mineral supplements as recommended by your health care provider. What happens during an annual well check? The services and screenings done by your health care provider during your annual well check will depend on your age, overall health, lifestyle risk factors, and family history of disease. Counseling  Your health care provider may ask you questions about your: Alcohol use. Tobacco use. Drug use. Emotional well-being. Home and relationship well-being. Sexual activity. Eating habits. Work and work Statistician. Method  of birth control. Menstrual cycle. Pregnancy history. Screening  You may have the following tests or measurements: Height, weight, and BMI. Blood pressure. Lipid and cholesterol levels. These may be checked every 5 years, or more frequently if you are over 47 years old. Skin check. Lung cancer screening. You may have this screening every year starting at age 24 if you have a 30-pack-year history of smoking and currently smoke or have quit within the past 15 years. Fecal occult blood test (FOBT) of the stool. You may have this test every year starting at age 83. Flexible sigmoidoscopy or colonoscopy. You may have a sigmoidoscopy every 5 years or a colonoscopy every 10 years starting at age 65. Hepatitis C blood test. Hepatitis B blood test. Sexually transmitted disease (STD) testing. Diabetes screening. This is done by checking your blood sugar (glucose) after you have not eaten for a while (fasting). You may have this done every 1-3 years. Mammogram. This may be done every 1-2 years. Talk to your health care provider about when you should start having regular mammograms. This may depend on whether you have a family history of breast cancer. BRCA-related cancer screening. This may be done if you have a family history of breast, ovarian, tubal, or peritoneal cancers. Pelvic exam and Pap test. This may be done every 3 years starting at age 56. Starting at age 62, this may be done every 5 years if you have a Pap test in combination with an HPV test. Bone density scan. This is done to screen for osteoporosis. You may have this scan if you are at high risk for osteoporosis. Discuss your test results, treatment options, and if necessary, the need for more tests with your health care provider. Vaccines  Your  health care provider may recommend certain vaccines, such as: Influenza vaccine. This is recommended every year. Tetanus, diphtheria, and acellular pertussis (Tdap, Td) vaccine. You may need a Td  booster every 10 years. Zoster vaccine. You may need this after age 22. Pneumococcal 13-valent conjugate (PCV13) vaccine. You may need this if you have certain conditions and were not previously vaccinated. Pneumococcal polysaccharide (PPSV23) vaccine. You may need one or two doses if you smoke cigarettes or if you have certain conditions. Talk to your health care provider about which screenings and vaccines you need and how often you need them. This information is not intended to replace advice given to you by your health care provider. Make sure you discuss any questions you have with your health care provider. Document Released: 05/26/2015 Document Revised: 01/17/2016 Document Reviewed: 02/28/2015 Elsevier Interactive Patient Education  2017 [place] Prevention in the Home Falls can cause injuries. They can happen to people of all ages. There are many things you can do to make your home safe and to help prevent falls. What can I do on the outside of my home? Regularly fix the edges of walkways and driveways and fix any cracks. Remove anything that might make you trip as you walk through a door, such as a raised step or threshold. Trim any bushes or trees on the path to your home. Use bright outdoor lighting. Clear any walking paths of anything that might make someone trip, such as rocks or tools. Regularly check to see if handrails are loose or broken. Make sure that both sides of any steps have handrails. Any raised decks and porches should have guardrails on the edges. Have any leaves, snow, or ice cleared regularly. Use sand or salt on walking paths during winter. Clean up any spills in your garage right away. This includes oil or grease spills. What can I do in the bathroom? Use night lights. Install grab bars by the toilet and in the tub and shower. Do not use towel bars as grab bars. Use non-skid mats or decals in the tub or shower. If you need to sit down in the  shower, use a plastic, non-slip stool. Keep the floor dry. Clean up any water that spills on the floor as soon as it happens. Remove soap buildup in the tub or shower regularly. Attach bath mats securely with double-sided non-slip rug tape. Do not have throw rugs and other things on the floor that can make you trip. What can I do in the bedroom? Use night lights. Make sure that you have a light by your bed that is easy to reach. Do not use any sheets or blankets that are too big for your bed. They should not hang down onto the floor. Have a firm chair that has side arms. You can use this for support while you get dressed. Do not have throw rugs and other things on the floor that can make you trip. What can I do in the kitchen? Clean up any spills right away. Avoid walking on wet floors. Keep items that you use a lot in easy-to-reach places. If you need to reach something above you, use a strong step stool that has a grab bar. Keep electrical cords out of the way. Do not use floor polish or wax that makes floors slippery. If you must use wax, use non-skid floor wax. Do not have throw rugs and other things on the floor that can make you trip. What can  I do with my stairs? Do not leave any items on the stairs. Make sure that there are handrails on both sides of the stairs and use them. Fix handrails that are broken or loose. Make sure that handrails are as long as the stairways. Check any carpeting to make sure that it is firmly attached to the stairs. Fix any carpet that is loose or worn. Avoid having throw rugs at the top or bottom of the stairs. If you do have throw rugs, attach them to the floor with carpet tape. Make sure that you have a light switch at the top of the stairs and the bottom of the stairs. If you do not have them, ask someone to add them for you. What else can I do to help prevent falls? Wear shoes that: Do not have high heels. Have rubber bottoms. Are comfortable and  fit you well. Are closed at the toe. Do not wear sandals. If you use a stepladder: Make sure that it is fully opened. Do not climb a closed stepladder. Make sure that both sides of the stepladder are locked into place. Ask someone to hold it for you, if possible. Clearly mark and make sure that you can see: Any grab bars or handrails. First and last steps. Where the edge of each step is. Use tools that help you move around (mobility aids) if they are needed. These include: Canes. Walkers. Scooters. Crutches. Turn on the lights when you go into a dark area. Replace any light bulbs as soon as they burn out. Set up your furniture so you have a clear path. Avoid moving your furniture around. If any of your floors are uneven, fix them. If there are any pets around you, be aware of where they are. Review your medicines with your doctor. Some medicines can make you feel dizzy. This can increase your chance of falling. Ask your doctor what other things that you can do to help prevent falls. This information is not intended to replace advice given to you by your health care provider. Make sure you discuss any questions you have with your health care provider. Document Released: 02/23/2009 Document Revised: 10/05/2015 Document Reviewed: 06/03/2014 Elsevier Interactive Patient Education  2017 [employer].

## 2022-06-14 DIAGNOSIS — R1011 Right upper quadrant pain: Secondary | ICD-10-CM | POA: Diagnosis not present

## 2022-06-14 DIAGNOSIS — K8020 Calculus of gallbladder without cholecystitis without obstruction: Secondary | ICD-10-CM | POA: Diagnosis not present

## 2022-06-14 DIAGNOSIS — K8080 Other cholelithiasis without obstruction: Secondary | ICD-10-CM | POA: Diagnosis not present

## 2022-06-17 ENCOUNTER — Telehealth: Payer: Self-pay

## 2022-06-17 DIAGNOSIS — K8010 Calculus of gallbladder with chronic cholecystitis without obstruction: Secondary | ICD-10-CM | POA: Insufficient documentation

## 2022-06-17 NOTE — Telephone Encounter (Signed)
   Pre-operative Risk Assessment    Patient Name: Brittany [name]  DOB: 1966/01/16 MRN: [id number]      Request for Surgical Clearance    Procedure:   Cholecystectomy   Date of Surgery:  Clearance 06/21/22                                 Surgeon:  Dr Melynda Ripple Surgeon's Group or Practice Name:  [facility] Phone number:  [phone] Fax number:  [phone]   Type of Clearance Requested:   - Pharmacy:  Hold Rivaroxaban (Xarelto)     Type of Anesthesia:  General    Additional requests/questions:   N/A  Toma Deiters   06/17/2022, 4:56 PM

## 2022-06-18 NOTE — Telephone Encounter (Signed)
Patient with diagnosis of A Fib on Xarelto for anticoagulation.    Procedure: Cholecystectomy  Date of procedure: 06/21/22   CHA2DS2-VASc Score = 3  This indicates a 3.2% annual risk of stroke. The patient's score is based upon: CHF History: 0 HTN History: 1 Diabetes History: 1 Stroke History: 0 Vascular Disease History: 0 Age Score: 0 Gender Score: 1    CrCl 100 mL/min Platelet count 206K   Per office protocol, patient can hold Xarelto for 2 days prior to procedure.    **This guidance is not considered finalized until pre-operative APP has relayed final recommendations.**

## 2022-06-18 NOTE — Telephone Encounter (Signed)
   Patient Name: Brittany [name]  DOB: 1966-04-26 MRN: [id number]  Primary Cardiologist: None  Clinical pharmacists have reviewed the patient's past medical history, labs, and current medications as part of preoperative protocol coverage. The following recommendations have been made:  Patient with diagnosis of A Fib on Xarelto for anticoagulation.     Procedure: Cholecystectomy  Date of procedure: 06/21/22     CHA2DS2-VASc Score = 3  This indicates a 3.2% annual risk of stroke. The patient's score is based upon: CHF History: 0 HTN History: 1 Diabetes History: 1 Stroke History: 0 Vascular Disease History: 0 Age Score: 0 Gender Score: 1     CrCl 100 mL/min Platelet count 206K     Per office protocol, patient can hold Xarelto for 2 days prior to procedure. Please resume Xarelto as soon as possible postprocedure, at the discretion of the surgeon.    I will route this recommendation to the requesting party via Epic fax function and remove from pre-op pool.  Please call with questions.  Lenna Sciara, NP 06/18/2022, 11:18 AM

## 2022-06-21 DIAGNOSIS — Z7901 Long term (current) use of anticoagulants: Secondary | ICD-10-CM | POA: Diagnosis not present

## 2022-06-21 DIAGNOSIS — K8010 Calculus of gallbladder with chronic cholecystitis without obstruction: Secondary | ICD-10-CM | POA: Diagnosis not present

## 2022-06-21 DIAGNOSIS — K819 Cholecystitis, unspecified: Secondary | ICD-10-CM | POA: Diagnosis not present

## 2022-06-21 DIAGNOSIS — I10 Essential (primary) hypertension: Secondary | ICD-10-CM | POA: Diagnosis not present

## 2022-06-21 DIAGNOSIS — E119 Type 2 diabetes mellitus without complications: Secondary | ICD-10-CM | POA: Diagnosis not present

## 2022-06-25 ENCOUNTER — Ambulatory Visit: Payer: Medicare HMO | Admitting: Family Medicine

## 2022-07-11 DIAGNOSIS — H26491 Other secondary cataract, right eye: Secondary | ICD-10-CM | POA: Diagnosis not present

## 2022-07-29 ENCOUNTER — Other Ambulatory Visit: Payer: Self-pay | Admitting: Family Medicine

## 2022-07-29 DIAGNOSIS — G609 Hereditary and idiopathic neuropathy, unspecified: Secondary | ICD-10-CM

## 2022-08-08 DIAGNOSIS — K50919 Crohn's disease, unspecified, with unspecified complications: Secondary | ICD-10-CM | POA: Diagnosis not present

## 2022-08-13 ENCOUNTER — Emergency Department (HOSPITAL_COMMUNITY)
Admission: EM | Admit: 2022-08-13 | Discharge: 2022-08-14 | Disposition: A | Discharge status: Home / Self Care | Payer: Medicare HMO | Attending: Emergency Medicine | Admitting: Emergency Medicine

## 2022-08-13 ENCOUNTER — Ambulatory Visit (INDEPENDENT_AMBULATORY_CARE_PROVIDER_SITE_OTHER): Payer: Medicare HMO | Admitting: Family Medicine

## 2022-08-13 ENCOUNTER — Emergency Department (HOSPITAL_COMMUNITY): Payer: Medicare HMO

## 2022-08-13 ENCOUNTER — Other Ambulatory Visit: Payer: Self-pay

## 2022-08-13 VITALS — BP 140/80 | HR 96 | Temp 98.7°F | Resp 14 | Ht 65.5 in | Wt 178.0 lb

## 2022-08-13 DIAGNOSIS — I480 Paroxysmal atrial fibrillation: Secondary | ICD-10-CM

## 2022-08-13 DIAGNOSIS — T63301A Toxic effect of unspecified spider venom, accidental (unintentional), initial encounter: Secondary | ICD-10-CM | POA: Diagnosis not present

## 2022-08-13 DIAGNOSIS — I4811 Longstanding persistent atrial fibrillation: Secondary | ICD-10-CM | POA: Diagnosis not present

## 2022-08-13 DIAGNOSIS — Z7901 Long term (current) use of anticoagulants: Secondary | ICD-10-CM | POA: Diagnosis not present

## 2022-08-13 DIAGNOSIS — R0602 Shortness of breath: Secondary | ICD-10-CM | POA: Diagnosis not present

## 2022-08-13 DIAGNOSIS — Z4502 Encounter for adjustment and management of automatic implantable cardiac defibrillator: Secondary | ICD-10-CM | POA: Insufficient documentation

## 2022-08-13 DIAGNOSIS — R002 Palpitations: Secondary | ICD-10-CM | POA: Diagnosis present

## 2022-08-13 DIAGNOSIS — Z0189 Encounter for other specified special examinations: Secondary | ICD-10-CM

## 2022-08-13 LAB — CBC
HCT: 47 % — ABNORMAL HIGH (ref 36.0–46.0)
Hemoglobin: 14.7 g/dL (ref 12.0–15.0)
MCH: 28.9 pg (ref 26.0–34.0)
MCHC: 31.3 g/dL (ref 30.0–36.0)
MCV: 92.5 fL (ref 80.0–100.0)
Platelets: 222 10*3/uL (ref 150–400)
RBC: 5.08 MIL/uL (ref 3.87–5.11)
RDW: 13.7 % (ref 11.5–15.5)
WBC: 7 10*3/uL (ref 4.0–10.5)
nRBC: 0 % (ref 0.0–0.2)

## 2022-08-13 LAB — BASIC METABOLIC PANEL
Anion gap: 10 (ref 5–15)
BUN: 13 mg/dL (ref 6–20)
CO2: 27 mmol/L (ref 22–32)
Calcium: 9.1 mg/dL (ref 8.9–10.3)
Chloride: 101 mmol/L (ref 98–111)
Creatinine, Ser: 0.76 mg/dL (ref 0.44–1.00)
GFR, Estimated: >60 mL/min (ref >60)
Glucose, Bld: 87 mg/dL (ref 70–99)
Potassium: 4.6 mmol/L (ref 3.5–5.1)
Sodium: 138 mmol/L (ref 135–145)

## 2022-08-13 LAB — TROPONIN I (HIGH SENSITIVITY): Troponin I (High Sensitivity): 8 ng/L (ref <18)

## 2022-08-13 MED ORDER — ETOMIDATE 2 MG/ML IV SOLN
10.0000 mg | INTRAVENOUS | Status: AC
  Administered 2022-08-14: 10 mg via INTRAVENOUS
  Filled 2022-08-13: qty 10

## 2022-08-13 MED ORDER — DOXYCYCLINE HYCLATE 100 MG PO TABS: 100.0000 mg | ORAL_TABLET | Freq: Two times a day (BID) | ORAL | 0 refills | Status: DC

## 2022-08-13 MED ORDER — TRIAMCINOLONE ACETONIDE 40 MG/ML IJ SUSP
80.0000 mg | Freq: Once | INTRAMUSCULAR | Status: AC
  Administered 2022-08-13: 80 mg via INTRAMUSCULAR

## 2022-08-13 NOTE — Sedation Documentation (Signed)
Sedation delay d/t provider not able to respond to bedside at this time d/t being called to an emergency

## 2022-08-13 NOTE — ED Provider Notes (Signed)
[facility] Provider Note   CSN: [id number] Arrival date & time: 08/13/22  1743     History {Add pertinent medical, surgical, social history, OB history to HPI:1} Chief Complaint  Patient presents with   Shortness of Breath   Atrial Fibrillation    Brittany [name] is a 57 y.o. female.  Usual gets sob This morning started feeling af Talked to cards>ED for  Xarelto since 2022 Over the weekend did drink etoh socially - 3-4 drins No caffeine use       Home Medications Prior to Admission medications   Medication Sig Start Date End Date Taking? Authorizing Provider  calcium-vitamin D (OSCAL WITH D) 500-200 MG-UNIT per tablet Take 1 tablet by mouth daily.    [provider]  doxycycline (VIBRA-TABS) 100 MG tablet Take 1 tablet (100 mg total) by mouth 2 (two) times daily. 08/13/22   [name], [name], MD  estradiol (ESTRACE) 1 MG tablet TAKE 1 TABLET EVERY DAY 03/18/22   [name], Kirsten, MD  ezetimibe (ZETIA) 10 MG tablet TAKE 1 TABLET (10 MG TOTAL) BY MOUTH DAILY. 05/07/22   Marge Duncans, PA-C  finasteride (PROSCAR) 5 MG tablet Take 1 tablet (5 mg total) by mouth daily. 05/22/22   CoxElnita Maxwell, MD  fish oil-omega-3 fatty acids 1000 MG capsule Take 1 g by mouth in the morning and at bedtime.    [provider]  gabapentin (NEURONTIN) 300 MG capsule TAKE 2 CAPSULES AT BEDTIME 07/29/22   [name], Kirsten, MD  Glucosamine-Chondroit-Vit C-Mn (GLUCOSAMINE 1500 COMPLEX PO) Take 2 tablets by mouth at bedtime.    [provider]  rivaroxaban (XARELTO) 20 MG TABS tablet TAKE 1 TABLET BY MOUTH ONCE DAILY WITH SUPPER 04/22/22   Camnitz, Ocie Doyne, MD  ustekinumab (STELARA) 90 MG/ML SOSY injection Inject 90 mg into the skin every 8 (eight) weeks.    [provider]  zolpidem (AMBIEN) 5 MG tablet TAKE 1 TABLET BY MOUTH EVERY DAY AT BEDTIME AS NEEDED FOR SLEEP 03/15/22   [name], [name], MD      Allergies    Mercaptopurine,  Amitriptyline, Crestor [rosuvastatin calcium], Metoprolol, Trazodone and nefazodone, Zocor [simvastatin], Morphine and related, and Penicillins    Review of Systems   Review of Systems  Physical Exam Updated Vital Signs BP 119/76 (BP Location: Right Arm)   Pulse 94   Temp 98 F (36.7 C)   Resp 18   LMP 09/23/2012   SpO2 100%  Physical Exam  ED Results / Procedures / Treatments   Labs (all labs ordered are listed, but only abnormal results are displayed) Labs Reviewed  CBC - Abnormal; Notable for the following components:      Result Value   HCT 47.0 (*)    All other components within normal limits  BASIC METABOLIC PANEL  TROPONIN I (HIGH SENSITIVITY)  TROPONIN I (HIGH SENSITIVITY)    EKG EKG Interpretation  Date/Time:  Tuesday August 13 2022 17:58:48 EDT Ventricular Rate:  82 PR Interval:    QRS Duration: 70 QT Interval:  358 QTC Calculation: 418 R Axis:   60 Text Interpretation: Atrial fibrillation Abnormal ECG When compared with ECG of 05-Feb-2022 11:11, PREVIOUS ECG IS PRESENT Confirmed by Margaretmary Eddy [phone]) on 08/13/2022 8:56:48 PM  Radiology DG Chest 2 View  Result Date: 08/13/2022 CLINICAL DATA:  Shortness of breath. EXAM: CHEST - 2 VIEW COMPARISON:  01/30/2022 FINDINGS: The cardiac silhouette, mediastinal and hilar contours are within normal limits and stable. The lungs  are clear of an acute process. No infiltrates, edema or effusions. No pulmonary lesions. No pneumothorax. The bony thorax is intact. IMPRESSION: No acute cardiopulmonary findings. Electronically Signed   By: Marijo Sanes M.D.   On: 08/13/2022 18:38    Procedures Procedures  {Document cardiac monitor, telemetry assessment procedure when appropriate:1}  Medications Ordered in ED Medications - No data to display  ED Course/ Medical Decision Making/ A&P   {   Click here for ABCD2, HEART and other calculatorsREFRESH Note before signing :1}                          Medical Decision  Making Amount and/or Complexity of Data Reviewed Labs: ordered. Radiology: ordered.   ***  {Document critical care time when appropriate:1} {Document review of labs and clinical decision tools ie heart score, Chads2Vasc2 etc:1}  {Document your independent review of radiology images, and any outside records:1} {Document your discussion with family members, caretakers, and with consultants:1} {Document social determinants of health affecting pt's care:1} {Document your decision making why or why not admission, treatments were needed:1} Final Clinical Impression(s) / ED Diagnoses Final diagnoses:  None    Rx / DC Orders ED Discharge Orders     None

## 2022-08-13 NOTE — Progress Notes (Signed)
Acute Office Visit  Subjective:    Patient ID: Brittany [name], female    DOB: [birth year], 57 y.o.   MRN: [id number]  Chief Complaint  Patient presents with   Insect Bite    HPI: Patient is in today for rash left forearm.  The area is red and puffy.  She started cipro which she had on hand Sunday pm.  She has used ice with some relief.  She also has noticed her heart rate is irregular today.  She does report some mild shortness of breath.  History of paroxysmal atrial fibrillation. Has had cardioversion x 2 in the past.   Past Medical History:  Diagnosis Date   BMI 26.0-26.9,adult 07/06/2019   Bradycardia 08/10/2014   Crohn's disease    DDD (degenerative disc disease), lumbar 07/17/2017   Diabetes mellitus without complication    Fatigue 08/10/2014   Hyperlipidemia    Hyperlipidemia associated with type 2 diabetes mellitus 07/06/2019   Hypertension    Hypertension, essential    Mixed hyperlipidemia    Palpitations 07/06/2019   Paresthesia 07/06/2019    Past Surgical History:  Procedure Laterality Date   ABDOMINAL HYSTERECTOMY N/A 10/20/2012   Procedure: HYSTERECTOMY ABDOMINAL;  Surgeon: Miguel Aschoff, MD;  Location: [facility];  Service: Gynecology;  Laterality: N/A;   ANAL FISSURE REPAIR  05/13/2000   APPENDECTOMY     CARDIOVERSION N/A 07/03/2021   Procedure: CARDIOVERSION;  Surgeon: Little Ishikawa, MD;  Location: [facility] ENDOSCOPY;  Service: Cardiovascular;  Laterality: N/A;   CATARACT EXTRACTION Bilateral    EYE SURGERY  05/31/2022   removed film   SALPINGOOPHORECTOMY Bilateral 10/20/2012   Procedure: SALPINGO OOPHORECTOMY;  Surgeon: Miguel Aschoff, MD;  Location: [facility];  Service: Gynecology;  Laterality: Bilateral;   SMALL INTESTINE SURGERY     reconstruction   TONSILLECTOMY      Family History  Problem Relation Age of Onset   Heart Problems Mother    Diabetes Mother    Diabetes Father    Heart Problems Father    Cancer Maternal Grandmother        lung   Peptic  Ulcer Disease Other     Social History   Socioeconomic History   Marital status: Widowed    Spouse name: Not on file   Number of children: 1   Years of education: Not on file   Highest education level: Not on file  Occupational History   Not on file  Tobacco Use   Smoking status: Former    Types: Cigarettes    Quit date: 2002    Years since quitting: 22.2   Smokeless tobacco: Never   Tobacco comments:    Former smoker 02/05/22  Vaping Use   Vaping Use: Never used  Substance and Sexual Activity   Alcohol use: Yes    Alcohol/week: 1.0 standard drink of alcohol    Types: 1 Standard drinks or equivalent per week    Comment: 1 drink per month 02/05/22   Drug use: No   Sexual activity: Not on file  Other Topics Concern   Not on file  Social History Narrative   wears sunscreen, brushes and flosses daily, see's dentist bi-annually, has smoke/carbon monoxide detectors, wears a seatbelt and practices gun safety   Social Determinants of Health   Financial Resource Strain: Low Risk  (06/12/2022)   Overall Financial Resource Strain (CARDIA)    Difficulty of Paying Living Expenses: Not very hard  Food Insecurity: No Food Insecurity (06/12/2022)   Hunger  Vital Sign    Worried About Programme researcher, broadcasting/film/video in the Last Year: Never true    Ran Out of Food in the Last Year: Never true  Transportation Needs: No Transportation Needs (06/12/2022)   PRAPARE - Administrator, Civil Service (Medical): No    Lack of Transportation (Non-Medical): No  Physical Activity: Insufficiently Active (06/12/2022)   Exercise Vital Sign    Days of Exercise per Week: 2 days    Minutes of Exercise per Session: 20 min  Stress: No Stress Concern Present (06/12/2022)   [employer] of Occupational Health - Occupational Stress Questionnaire    Feeling of Stress : Only a little  Social Connections: Moderately Integrated (06/12/2022)   Social Connection and Isolation Panel [NHANES]    Frequency of  Communication with Friends and Family: More than three times a week    Frequency of Social Gatherings with Friends and Family: More than three times a week    Attends Religious Services: More than 4 times per year    Active Member of [employer] or Organizations: Yes    Attends Banker Meetings: 1 to 4 times per year    Marital Status: Widowed  Intimate Partner Violence: Not At Risk (06/13/2022)   Humiliation, Afraid, Rape, and Kick questionnaire    Fear of Current or Ex-Partner: No    Emotionally Abused: No    Physically Abused: No    Sexually Abused: No    Outpatient Medications Prior to Visit  Medication Sig Dispense Refill   calcium-vitamin D (OSCAL WITH D) 500-200 MG-UNIT per tablet Take 1 tablet by mouth daily.     estradiol (ESTRACE) 1 MG tablet TAKE 1 TABLET EVERY DAY 90 tablet 3   ezetimibe (ZETIA) 10 MG tablet TAKE 1 TABLET (10 MG TOTAL) BY MOUTH DAILY. 90 tablet 0   finasteride (PROSCAR) 5 MG tablet Take 1 tablet (5 mg total) by mouth daily. 90 tablet 1   fish oil-omega-3 fatty acids 1000 MG capsule Take 1 g by mouth in the morning and at bedtime.     gabapentin (NEURONTIN) 300 MG capsule TAKE 2 CAPSULES AT BEDTIME 180 capsule 3   Glucosamine-Chondroit-Vit C-Mn (GLUCOSAMINE 1500 COMPLEX PO) Take 2 tablets by mouth at bedtime.     rivaroxaban (XARELTO) 20 MG TABS tablet TAKE 1 TABLET BY MOUTH ONCE DAILY WITH SUPPER 90 tablet 1   ustekinumab (STELARA) 90 MG/ML SOSY injection Inject 90 mg into the skin every 8 (eight) weeks.     zolpidem (AMBIEN) 5 MG tablet TAKE 1 TABLET BY MOUTH EVERY DAY AT BEDTIME AS NEEDED FOR SLEEP 90 tablet 0   No facility-administered medications prior to visit.    Allergies  Allergen Reactions   Mercaptopurine     Used to treat pt. Crohn's Disease.  Caused Pancreatis 1992. Other reaction(s): Other (See Comments) Caused Pancreatis 1992   Amitriptyline     No emotions  Other reaction(s): Other (See Comments) Felt poorly while taking    Crestor [Rosuvastatin Calcium]     Muscle pain   Metoprolol Other (See Comments)    Extreme fatigue    Trazodone And Nefazodone     Did not work for pt    Zocor [Simvastatin]     Cramps in legs   Morphine And Related Itching   Penicillins Rash    Review of Systems  Constitutional:  Positive for fatigue. Negative for chills and fever.  HENT:  Negative for congestion, rhinorrhea and sore throat.  Respiratory:  Positive for shortness of breath. Negative for cough.   Cardiovascular:  Positive for palpitations. Negative for chest pain.  Gastrointestinal:  Positive for diarrhea. Negative for abdominal pain, constipation, nausea and vomiting.  Genitourinary:  Negative for dysuria and urgency.  Musculoskeletal:  Negative for back pain and myalgias.  Skin:  Positive for rash (red rash left forearm).  Neurological:  Negative for dizziness, weakness, light-headedness and headaches.  Psychiatric/Behavioral:  Negative for dysphoric mood. The patient is not nervous/anxious.        Objective:        08/17/2022    1:48 PM 08/14/2022    1:15 AM 08/14/2022   12:45 AM  Vitals with BMI  Height 5' 5.5"    Weight 178 lbs    BMI 29.16    Systolic 140 160 194  Diastolic 80 77 67  Pulse 96 56 48    No data found.   Physical Exam Vitals reviewed.  Constitutional:      Appearance: Normal appearance.  Cardiovascular:     Rate and Rhythm: Normal rate. Rhythm irregular.     Heart sounds: Normal heart sounds.  Pulmonary:     Effort: Pulmonary effort is normal.     Breath sounds: Normal breath sounds.  Skin:    Findings: Lesion (2 red lesions on left arm. central area open. not hives.) present.  Neurological:     Mental Status: She is alert.     Health Maintenance Due  Topic Date Due   DTaP/Tdap/Td (1 - Tdap) Never done   Zoster Vaccines- Shingrix (1 of 2) Never done   COVID-19 Vaccine (3 - Pfizer risk series) 09/22/2019   OPHTHALMOLOGY EXAM  02/24/2022   FOOT EXAM  08/09/2022     There are no preventive care reminders to display for this patient.   Lab Results  Component Value Date   TSH 1.721 01/30/2022   Lab Results  Component Value Date   WBC 7.0 08/13/2022   HGB 14.7 08/13/2022   HCT 47.0 (H) 08/13/2022   MCV 92.5 08/13/2022   PLT 222 08/13/2022   Lab Results  Component Value Date   NA 138 08/13/2022   K 4.6 08/13/2022   CO2 27 08/13/2022   GLUCOSE 87 08/13/2022   BUN 13 08/13/2022   CREATININE 0.76 08/13/2022   BILITOT 0.5 06/07/2022   ALKPHOS 58 06/07/2022   AST 16 06/07/2022   ALT 14 06/07/2022   PROT 6.8 06/07/2022   ALBUMIN 4.4 06/07/2022   CALCIUM 9.1 08/13/2022   ANIONGAP 10 08/13/2022   EGFR 88 06/07/2022   Lab Results  Component Value Date   CHOL 234 (H) 06/07/2022   Lab Results  Component Value Date   HDL 86 06/07/2022   Lab Results  Component Value Date   LDLCALC 122 (H) 06/07/2022   Lab Results  Component Value Date   TRIG 151 (H) 06/07/2022   Lab Results  Component Value Date   CHOLHDL 2.7 06/07/2022   Lab Results  Component Value Date   HGBA1C 5.7 (H) 06/07/2022       Assessment & Plan:  Longstanding persistent atrial fibrillation Assessment & Plan: Sent to ED for cardioversion after I discussed with cardiology.  Orders: -     EKG 12-Lead  Spider bite wound, accidental or unintentional, initial encounter Assessment & Plan: Start on doxycycline.  Kenalog shot given.   Orders: -     Doxycycline Hyclate; Take 1 tablet (100 mg total) by mouth 2 (two) times  daily.  Dispense: 14 tablet; Refill: 0 -     Triamcinolone Acetonide     Meds ordered this encounter  Medications   doxycycline (VIBRA-TABS) 100 MG tablet    Sig: Take 1 tablet (100 mg total) by mouth 2 (two) times daily.    Dispense:  14 tablet    Refill:  0   triamcinolone acetonide (KENALOG-40) injection 80 mg    Orders Placed This Encounter  Procedures   EKG 12-Lead     Follow-up: No follow-ups on file.  An After Visit  Summary was printed and given to the patient.  Blane Ohara, MD [facility] [phone]

## 2022-08-13 NOTE — ED Provider Triage Note (Signed)
Emergency Medicine Provider Triage Evaluation Note  [name] , a 57 y.o. female  was evaluated in triage.  Pt complains of palpitations starting this AM w/associated headache and SOB. Feels like she is in afib. Has been compliant with xarelto.  Review of Systems  Positive: palpitations Negative: unilateral swelling of legs  Physical Exam  BP 137/82 (BP Location: Right Arm)   Pulse (!) 57   Temp 98.2 F (36.8 C) (Oral)   Resp 16   LMP 09/23/2012   SpO2 98%  Gen:   Awake, no distress   Resp:  Normal effort  MSK:   Moves extremities without difficulty   Medical Decision Making  Medically screening exam initiated at 7:07 PM.  Appropriate orders placed.  Lelon Frohlich Bartko was informed that the remainder of the evaluation will be completed by another provider, this initial triage assessment does not replace that evaluation, and the importance of remaining in the ED until their evaluation is complete.    Osvaldo Shipper, [place] 08/13/22 1909

## 2022-08-13 NOTE — ED Triage Notes (Signed)
Pt reports palpitations, DOE, fatigue, dizziness, and headache since this morning, a similar feeling to when she has gone into afib before. Smart watch told her she was in afib. Went to PCP who called her cardiologist who advised to come to the ED. EKG afib rate in 80s. On Xarelto.

## 2022-08-13 NOTE — ED Notes (Signed)
Pt assisted with a bedpan 

## 2022-08-14 MED ORDER — HYDRALAZINE HCL 10 MG PO TABS
10.0000 mg | ORAL_TABLET | Freq: Once | ORAL | Status: AC
  Administered 2022-08-14: 10 mg via ORAL
  Filled 2022-08-14: qty 1

## 2022-08-14 MED ORDER — HYDRALAZINE HCL 25 MG PO TABS: 25.0000 mg | ORAL_TABLET | Freq: Once | ORAL | Status: DC

## 2022-08-14 NOTE — Discharge Instructions (Signed)
You were seen for your atrial fibrillation in the emergency department.  You had an electrical cardioversion performed.  At home, please continue your Xarelto.    Follow-up with your primary doctor in 2-3 days regarding your visit.  Please also have your blood pressure rechecked to see if you need to be started on blood pressure medication.  Return immediately to the emergency department if you experience any of the following: Chest pain, shortness of breath, palpitations, fainting, or any other concerning symptoms.    Thank you for visiting our Emergency Department. It was a pleasure taking care of you today.

## 2022-08-14 NOTE — Sedation Documentation (Signed)
PT WAKING UP/ PT IS TEARFUL BUT OTHERWISE VERY RESPONSIVE AND ANSWERING QUESTIONS APPROPRIATELY.

## 2022-08-14 NOTE — Sedation Documentation (Addendum)
SHOCK X1 BY DR. PATERSON

## 2022-08-14 NOTE — ED Notes (Signed)
Pt not apnea SPO at 98

## 2022-08-14 NOTE — Sedation Documentation (Signed)
Family updated as to patient's status.

## 2022-08-17 ENCOUNTER — Encounter: Payer: Self-pay | Admitting: Family Medicine

## 2022-08-17 DIAGNOSIS — T63301A Toxic effect of unspecified spider venom, accidental (unintentional), initial encounter: Secondary | ICD-10-CM | POA: Insufficient documentation

## 2022-08-17 NOTE — Assessment & Plan Note (Signed)
Sent to ED for cardioversion after I discussed with cardiology.

## 2022-08-17 NOTE — Assessment & Plan Note (Signed)
Start on doxycycline.  Kenalog shot given.

## 2022-08-19 ENCOUNTER — Telehealth: Payer: Self-pay

## 2022-08-19 NOTE — Telephone Encounter (Signed)
        Patient  visited [facility] on 08/14/2022  for shortness of breath, atrial fibrillation.   Telephone encounter attempt :  1st  A HIPAA compliant voice message was left requesting a return call.  Instructed patient to call back at [phone].   Beuna Bolding [facility]  [facility] Population Health Community Resource Care Guide   [email or web address]  ?? [phone]   Website: [email or web address]  [email or web address]

## 2022-08-20 ENCOUNTER — Telehealth: Payer: Self-pay

## 2022-08-20 NOTE — Telephone Encounter (Signed)
     Patient  visit on 08/14/2022  at [facility] was for shortness of breath.  Have you been able to follow up with your primary care physician? Patient is has appointment with her Cardiologist.  The patient was or was not able to obtain any needed medicine or equipment. No medication prescribed.  Are there diet recommendations that you are having difficulty following? No  Patient expresses understanding of discharge instructions and education provided has no other needs at this time. Yes   Mataeo Ingwersen [facility]  [facility] Population Health Community Resource Care Guide   [email or web address]  ?? [phone]   Website: [email or web address]  [email or web address]

## 2022-08-27 ENCOUNTER — Encounter: Payer: Self-pay | Admitting: Cardiology

## 2022-08-27 ENCOUNTER — Ambulatory Visit: Payer: Medicare HMO | Attending: Cardiology | Admitting: Cardiology

## 2022-08-27 VITALS — BP 130/84 | HR 48 | Ht 65.5 in | Wt 179.0 lb

## 2022-08-27 DIAGNOSIS — I10 Essential (primary) hypertension: Secondary | ICD-10-CM | POA: Diagnosis not present

## 2022-08-27 DIAGNOSIS — I480 Paroxysmal atrial fibrillation: Secondary | ICD-10-CM

## 2022-08-27 NOTE — Patient Instructions (Signed)
Medication Instructions:  Your physician recommends that you continue on your current medications as directed. Please refer to the Current Medication list given to you today.  *If you need a refill on your cardiac medications before your next appointment, please call your pharmacy*   Lab Work: Pre procedure labs -- see procedure instruction letter:  BMP & CBC You will be able to go by the [facility] office for this.    If you have labs (blood work) drawn today and your tests are completely normal, you will receive your results only by: MyChart Message (if you have MyChart) OR A paper copy in the mail If you have any lab test that is abnormal or we need to change your treatment, we will call you to review the results.   Testing/Procedures: Your physician has requested that you have cardiac CT within 7 days PRIOR to your ablation. Cardiac computed tomography (CT) is a painless test that uses an x-ray machine to take clear, detailed pictures of your heart.  Please follow instruction below located under "other instructions". You will get a call from our office to schedule the date for this test.  Your physician has recommended that you have an ablation. Catheter ablation is a medical procedure used to treat some cardiac arrhythmias (irregular heartbeats). During catheter ablation, a long, thin, flexible tube is put into a blood vessel in your groin (upper thigh), or neck. This tube is called an ablation catheter. It is then guided to your heart through the blood vessel. Radio frequency waves destroy small areas of heart tissue where abnormal heartbeats may cause an arrhythmia to start. Please follow instruction letter given to you today.  You will be scheduled for 01/09/23.  We will call you at a later date to go over instructions, but will also sent them via mychart.    Follow-Up: At [facility], you and your health needs are our priority.  As part of our continuing mission to provide you with  exceptional heart care, we have created designated Provider Care Teams.  These Care Teams include your primary Cardiologist (physician) and Advanced Practice Providers (APPs -  Physician Assistants and Nurse Practitioners) who all work together to provide you with the care you need, when you need it.  Your next appointment:   1 month(s) after your ablation  The format for your next appointment:   In Person  Provider:   AFib clinic   Thank you for choosing [facility]!!   Dory Horn, RN [phone]    Other Instructions   Cardiac Ablation Cardiac ablation is a procedure to destroy (ablate) some heart tissue that is sending bad signals. These bad signals cause problems in heart rhythm. The heart has many areas that make these signals. If there are problems in these areas, they can make the heart beat in a way that is not normal. Destroying some tissues can help make the heart rhythm normal. Tell your doctor about: Any allergies you have. All medicines you are taking. These include vitamins, herbs, eye drops, creams, and over-the-counter medicines. Any problems you or family members have had with medicines that make you fall asleep (anesthetics). Any blood disorders you have. Any surgeries you have had. Any medical conditions you have, such as kidney failure. Whether you are pregnant or may be pregnant. What are the risks? This is a safe procedure. But problems may occur, including: Infection. Bruising and bleeding. Bleeding into the chest. Stroke or blood clots. Damage to nearby areas of your  body. Allergies to medicines or dyes. The need for a pacemaker if the normal system is damaged. Failure of the procedure to treat the problem. What happens before the procedure? Medicines Ask your doctor about: Changing or stopping your normal medicines. This is important. Taking aspirin and ibuprofen. Do not take these medicines unless your doctor tells you to take  them. Taking other medicines, vitamins, herbs, and supplements. General instructions Follow instructions from your doctor about what you cannot eat or drink. Plan to have someone take you home from the hospital or clinic. If you will be going home right after the procedure, plan to have someone with you for 24 hours. Ask your doctor what steps will be taken to prevent infection. What happens during the procedure?  An IV tube will be put into one of your veins. You will be given a medicine to help you relax. The skin on your neck or groin will be numbed. A cut (incision) will be made in your neck or groin. A needle will be put through your cut and into a large vein. A tube (catheter) will be put into the needle. The tube will be moved to your heart. Dye may be put through the tube. This helps your doctor see your heart. Small devices (electrodes) on the tube will send out signals. A type of energy will be used to destroy some heart tissue. The tube will be taken out. Pressure will be held on your cut. This helps stop bleeding. A bandage will be put over your cut. The exact procedure may vary among doctors and hospitals. What happens after the procedure? You will be watched until you leave the hospital or clinic. This includes checking your heart rate, breathing rate, oxygen, and blood pressure. Your cut will be watched for bleeding. You will need to lie still for a few hours. Do not drive for 24 hours or as long as your doctor tells you. Summary Cardiac ablation is a procedure to destroy some heart tissue. This is done to treat heart rhythm problems. Tell your doctor about any medical conditions you may have. Tell him or her about all medicines you are taking to treat them. This is a safe procedure. But problems may occur. These include infection, bruising, bleeding, and damage to nearby areas of your body. Follow what your doctor tells you about food and drink. You may also be told to  change or stop some of your medicines. After the procedure, do not drive for 24 hours or as long as your doctor tells you. This information is not intended to replace advice given to you by your health care provider. Make sure you discuss any questions you have with your health care provider. Document Revised: 07/20/2021 Document Reviewed: 04/01/2019 Elsevier Patient Education  2023 [employer].

## 2022-08-27 NOTE — Progress Notes (Signed)
**Note Brittany-Identified via Obfuscation** Electrophysiology Office Note   Date:  08/27/2022   ID:  Brittany [name], DOB 12-29-1965, MRN [id number]  PCP:  Blane Ohara, MD  Cardiologist:   Primary Electrophysiologist:  Anaia Frith Jorja Loa, MD    Chief Complaint: bradycardia   History of Present Illness: Brittany [name] is a 57 y.o. female who is being seen today for the evaluation of bradycardia at the request of [name], Fritzi Mandes, MD. Presenting today for electrophysiology evaluation.  She has a history significant for diabetes, hypertension, hyperlipidemia, Crohn's disease.  She presented to cardiology clinic post COVID with palpitations and bradycardia.  She was had heart rates in the 40s with fatigue.  She then presented to emergency room 04/01/2021 with atrial fibrillation.  She had a cardioversion 07/03/2021.  She presented to the emergency room for 4-24 and atrial fibrillation.  She was having significant shortness of breath and fatigue.  She had a cardioversion.  Post cardioversion, she felt well and was discharged from the emergency room.  Today, denies symptoms of palpitations, chest pain, shortness of breath, orthopnea, PND, lower extremity edema, claudication, dizziness, presyncope, syncope, bleeding, or neurologic sequela. The patient is tolerating medications without difficulties.  She feels well now.  When she was in atrial fibrillation she felt weak, fatigued, with palpitations.  She also had shortness of breath.  After she got cardioverted, her weakness and fatigue continued for a few days and then she started to feel better.  She would like a rhythm control strategy.   Past Medical History:  Diagnosis Date   BMI 26.0-26.9,adult 07/06/2019   Bradycardia 08/10/2014   Crohn's disease    DDD (degenerative disc disease), lumbar 07/17/2017   Diabetes mellitus without complication    Fatigue 08/10/2014   Hyperlipidemia    Hyperlipidemia associated with type 2 diabetes mellitus 07/06/2019   Hypertension     Hypertension, essential    Mixed hyperlipidemia    Palpitations 07/06/2019   Paresthesia 07/06/2019   Past Surgical History:  Procedure Laterality Date   ABDOMINAL HYSTERECTOMY N/A 10/20/2012   Procedure: HYSTERECTOMY ABDOMINAL;  Surgeon: Miguel Aschoff, MD;  Location: [facility];  Service: Gynecology;  Laterality: N/A;   ANAL FISSURE REPAIR  05/13/2000   APPENDECTOMY     CARDIOVERSION N/A 07/03/2021   Procedure: CARDIOVERSION;  Surgeon: Little Ishikawa, MD;  Location: [facility];  Service: Cardiovascular;  Laterality: N/A;   CATARACT EXTRACTION Bilateral    EYE SURGERY  05/31/2022   removed film   SALPINGOOPHORECTOMY Bilateral 10/20/2012   Procedure: SALPINGO OOPHORECTOMY;  Surgeon: Miguel Aschoff, MD;  Location: [facility];  Service: Gynecology;  Laterality: Bilateral;   SMALL INTESTINE SURGERY     reconstruction   TONSILLECTOMY       Current Outpatient Medications  Medication Sig Dispense Refill   calcium-vitamin D (OSCAL WITH D) 500-200 MG-UNIT per tablet Take 1 tablet by mouth daily.     estradiol (ESTRACE) 1 MG tablet TAKE 1 TABLET EVERY DAY 90 tablet 3   ezetimibe (ZETIA) 10 MG tablet TAKE 1 TABLET (10 MG TOTAL) BY MOUTH DAILY. 90 tablet 0   finasteride (PROSCAR) 5 MG tablet Take 1 tablet (5 mg total) by mouth daily. 90 tablet 1   fish oil-omega-3 fatty acids 1000 MG capsule Take 1 g by mouth in the morning and at bedtime.     gabapentin (NEURONTIN) 300 MG capsule TAKE 2 CAPSULES AT BEDTIME 180 capsule 3   Glucosamine-Chondroit-Vit C-Mn (GLUCOSAMINE 1500 COMPLEX PO) Take 2 tablets by mouth at bedtime.  rivaroxaban (XARELTO) 20 MG TABS tablet TAKE 1 TABLET BY MOUTH ONCE DAILY WITH SUPPER 90 tablet 1   ustekinumab (STELARA) 90 MG/ML SOSY injection Inject 90 mg into the skin every 8 (eight) weeks.     zolpidem (AMBIEN) 5 MG tablet TAKE 1 TABLET BY MOUTH EVERY DAY AT BEDTIME AS NEEDED FOR SLEEP 90 tablet 0   doxycycline (VIBRA-TABS) 100 MG tablet Take 1 tablet (100 mg total) by  mouth 2 (two) times daily. 14 tablet 0   No current facility-administered medications for this visit.    Allergies:   Mercaptopurine, Amitriptyline, Crestor [rosuvastatin calcium], Metoprolol, Trazodone and nefazodone, Zocor [simvastatin], Morphine and related, and Penicillins   Social History:  The patient  reports that she quit smoking about 22 years ago. Her smoking use included cigarettes. She has never used smokeless tobacco. She reports current alcohol use of about 1.0 standard drink of alcohol per week. She reports that she does not use drugs.   Family History:  The patient's family history includes Cancer in her maternal grandmother; Diabetes in her father and mother; Heart Problems in her father and mother; Peptic Ulcer Disease in an other family member.   ROS:  Please see the history of present illness.   Otherwise, review of systems is positive for none.   All other systems are reviewed and negative.   PHYSICAL EXAM: VS:  BP 130/84   Pulse (!) 48   Ht 5' 5.5" (1.664 m)   Wt 179 lb (81.2 kg)   LMP 09/23/2012   SpO2 99%   BMI 29.33 kg/m  , BMI Body mass index is 29.33 kg/m. GEN: Well nourished, well developed, in no acute distress  HEENT: normal  Neck: no JVD, carotid bruits, or masses Cardiac: RRR; no murmurs, rubs, or gallops,no edema  Respiratory:  clear to auscultation bilaterally, normal work of breathing GI: soft, nontender, nondistended, + BS MS: no deformity or atrophy  Skin: warm and dry Neuro:  Strength and sensation are intact Psych: euthymic mood, full affect  EKG:  EKG is not ordered today. Personal review of the ekg ordered 08/13/22 shows atrial fibrillation   Recent Labs: 01/30/2022: Magnesium 2.0; TSH 1.721 06/07/2022: ALT 14 08/13/2022: BUN 13; Creatinine, Ser 0.76; Hemoglobin 14.7; Platelets 222; Potassium 4.6; Sodium 138    Lipid Panel     Component Value Date/Time   CHOL 234 (H) 06/07/2022 1213   TRIG 151 (H) 06/07/2022 1213   HDL 86 06/07/2022  1213   CHOLHDL 2.7 06/07/2022 1213   LDLCALC 122 (H) 06/07/2022 1213     Wt Readings from Last 3 Encounters:  08/27/22 179 lb (81.2 kg)  08/13/22 178 lb (80.7 kg)  08/17/22 178 lb (80.7 kg)      Other studies Reviewed: Additional studies/ records that were reviewed today include: TTE 05/01/21 Review of the above records today demonstrates:   1. Left ventricular ejection fraction, by estimation, is 60 to 65%. The  left ventricle has normal function. The left ventricle has no regional  wall motion abnormalities. There is mild left ventricular hypertrophy of  the inferior segment. Left  ventricular diastolic parameters are indeterminate.   2. Right ventricular systolic function is normal. The right ventricular  size is normal.   3. Left atrial size was mildly dilated.   4. The mitral valve is normal in structure. Mild mitral valve  regurgitation. No evidence of mitral stenosis.   5. The aortic valve is normal in structure. Aortic valve regurgitation is  not visualized.  No aortic stenosis is present.   6. The inferior vena cava is normal in size with greater than 50%  respiratory variability, suggesting right atrial pressure of 3 mmHg.    Myoview 04/18/2021 Low risk stress nuclear study with normal perfusion and normal left ventricular regional and global systolic function.  ASSESSMENT AND PLAN:  1.  Paroxysmal atrial fibrillation: CHA2DS2-VASc of 1.  Currently on Xarelto.  She is unfortunate continue to have episodes of atrial fibrillation.  She would prefer a rhythm control strategy.  She would like to avoid long-term medications.  Nava Song plan for ablation.  Risk and benefits were discussed.  She understands the risks and is agreed to the procedure.  Risk, benefits, and alternatives to EP study and radiofrequency ablation for afib were also discussed in detail today. These risks include but are not limited to stroke, bleeding, vascular damage, tamponade, perforation, damage to the  esophagus, lungs, and other structures, pulmonary vein stenosis, worsening renal function, and death. The patient understands these risk and wishes to proceed.  We Manoah Deckard therefore proceed with catheter ablation at the next available time.  Carto, ICE, anesthesia are requested for the procedure.  Ileene Allie also obtain CT PV protocol prior to the procedure to exclude LAA thrombus and further evaluate atrial anatomy.   2.  Hypertension: Currently well-controlled  3.  Hyperlipidemia: Diet and exercise controlled  Current medicines are reviewed at length with the patient today.   The patient does not have concerns regarding her medicines.  The following changes were made today: None  Labs/ tests ordered today include:  No orders of the defined types were placed in this encounter.    Disposition:   FU 6 months  Signed, Winner Valeriano Jorja Loa, MD  08/27/2022 11:53 AM     [facility] [address] [place] [address] [phone] (office) [phone] (fax)

## 2022-09-18 ENCOUNTER — Emergency Department (HOSPITAL_COMMUNITY): Payer: Medicare HMO

## 2022-09-18 ENCOUNTER — Emergency Department (HOSPITAL_COMMUNITY)
Admission: EM | Admit: 2022-09-18 | Discharge: 2022-09-18 | Disposition: A | Discharge status: Home / Self Care | Payer: Medicare HMO | Attending: Emergency Medicine | Admitting: Emergency Medicine

## 2022-09-18 ENCOUNTER — Encounter (HOSPITAL_COMMUNITY): Payer: Self-pay | Admitting: Pharmacy Technician

## 2022-09-18 ENCOUNTER — Other Ambulatory Visit: Payer: Self-pay

## 2022-09-18 DIAGNOSIS — Z0189 Encounter for other specified special examinations: Secondary | ICD-10-CM | POA: Diagnosis not present

## 2022-09-18 DIAGNOSIS — I10 Essential (primary) hypertension: Secondary | ICD-10-CM | POA: Insufficient documentation

## 2022-09-18 DIAGNOSIS — R0602 Shortness of breath: Secondary | ICD-10-CM | POA: Diagnosis not present

## 2022-09-18 DIAGNOSIS — E119 Type 2 diabetes mellitus without complications: Secondary | ICD-10-CM | POA: Insufficient documentation

## 2022-09-18 DIAGNOSIS — Z7901 Long term (current) use of anticoagulants: Secondary | ICD-10-CM | POA: Insufficient documentation

## 2022-09-18 DIAGNOSIS — I480 Paroxysmal atrial fibrillation: Secondary | ICD-10-CM | POA: Insufficient documentation

## 2022-09-18 DIAGNOSIS — R002 Palpitations: Secondary | ICD-10-CM | POA: Diagnosis not present

## 2022-09-18 LAB — BRAIN NATRIURETIC PEPTIDE: B Natriuretic Peptide: 315.9 pg/mL — ABNORMAL HIGH (ref 0.0–100.0)

## 2022-09-18 LAB — BASIC METABOLIC PANEL
Anion gap: 10 (ref 5–15)
BUN: 13 mg/dL (ref 6–20)
CO2: 25 mmol/L (ref 22–32)
Calcium: 9.1 mg/dL (ref 8.9–10.3)
Chloride: 104 mmol/L (ref 98–111)
Creatinine, Ser: 0.86 mg/dL (ref 0.44–1.00)
GFR, Estimated: >60 mL/min (ref >60)
Glucose, Bld: 166 mg/dL — ABNORMAL HIGH (ref 70–99)
Potassium: 4.1 mmol/L (ref 3.5–5.1)
Sodium: 139 mmol/L (ref 135–145)

## 2022-09-18 LAB — CBC
HCT: 47.3 % — ABNORMAL HIGH (ref 36.0–46.0)
Hemoglobin: 14.9 g/dL (ref 12.0–15.0)
MCH: 29.2 pg (ref 26.0–34.0)
MCHC: 31.5 g/dL (ref 30.0–36.0)
MCV: 92.7 fL (ref 80.0–100.0)
Platelets: 192 10*3/uL (ref 150–400)
RBC: 5.1 MIL/uL (ref 3.87–5.11)
RDW: 14.1 % (ref 11.5–15.5)
WBC: 5.6 10*3/uL (ref 4.0–10.5)
nRBC: 0 % (ref 0.0–0.2)

## 2022-09-18 LAB — TROPONIN I (HIGH SENSITIVITY)
Troponin I (High Sensitivity): 6 ng/L (ref <18)
Troponin I (High Sensitivity): 5 ng/L (ref <18)

## 2022-09-18 LAB — TSH: TSH: 1.101 u[IU]/mL (ref 0.350–4.500)

## 2022-09-18 MED ORDER — ETOMIDATE 2 MG/ML IV SOLN
8.0000 mg | Freq: Once | INTRAVENOUS | Status: AC
  Administered 2022-09-18: 8 mg via INTRAVENOUS

## 2022-09-18 MED ORDER — LACTATED RINGERS IV BOLUS
1000.0000 mL | Freq: Once | INTRAVENOUS | Status: AC
  Administered 2022-09-18: 1000 mL via INTRAVENOUS

## 2022-09-18 MED ORDER — ONDANSETRON HCL 4 MG/2ML IJ SOLN
4.0000 mg | Freq: Once | INTRAMUSCULAR | Status: AC
  Administered 2022-09-18: 4 mg via INTRAVENOUS

## 2022-09-18 MED ORDER — ETOMIDATE 2 MG/ML IV SOLN
INTRAVENOUS | Status: AC | PRN
  Administered 2022-09-18: 8 mg via INTRAVENOUS

## 2022-09-18 MED ORDER — LACTATED RINGERS IV SOLN: INTRAVENOUS | Status: DC

## 2022-09-18 MED ORDER — SODIUM CHLORIDE 0.9 % IV BOLUS: 1000.0000 mL | Freq: Once | INTRAVENOUS | Status: DC

## 2022-09-18 NOTE — ED Triage Notes (Signed)
Pt here POV with reports of feeling palpitations last night while laying in bed. This morning pt continues to feel palpitation. Hx of afib, currently on xarelto.

## 2022-09-18 NOTE — ED Notes (Signed)
Pt ambulatory to and from restroom with steady gait 

## 2022-09-18 NOTE — ED Notes (Signed)
MD Dixon notified of SB after cardioversion

## 2022-09-18 NOTE — ED Provider Notes (Signed)
Pending consultation from cardiology for recommendations. Pending ablation in 12/2022. Compliant with xarelto, not on meds for rate control.  Physical Exam  BP (!) 144/74   Pulse 61   Temp 97.9 F (36.6 C)   Resp 12   LMP 09/23/2012   SpO2 100%   Physical Exam Vitals and nursing note reviewed.  Constitutional:      General: She is not in acute distress.    Appearance: She is well-developed.  HENT:     Head: Normocephalic and atraumatic.  Eyes:     Conjunctiva/sclera: Conjunctivae normal.  Cardiovascular:     Rate and Rhythm: Normal rate and regular rhythm.     Heart sounds: No murmur heard. Pulmonary:     Effort: Pulmonary effort is normal. No respiratory distress.     Breath sounds: Normal breath sounds.  Abdominal:     Palpations: Abdomen is soft.     Tenderness: There is no abdominal tenderness.  Musculoskeletal:        General: No swelling.     Cervical back: Neck supple.  Skin:    General: Skin is warm and dry.     Capillary Refill: Capillary refill takes less than 2 seconds.  Neurological:     Mental Status: She is alert.  Psychiatric:        Mood and Affect: Mood normal.     Procedures  .Cardioversion  Date/Time: 09/18/2022 6:25 PM  Performed by: Pete Pelt, PA Authorized by: Pete Pelt, PA   Consent:    Consent obtained:  Verbal and written   Consent given by:  Patient   Risks discussed:  Cutaneous burn, death, induced arrhythmia and pain   Alternatives discussed:  Referral Pre-procedure details:    Rhythm:  Atrial fibrillation   Electrode placement:  Anterior-posterior Patient sedated: Yes. Refer to sedation procedure documentation for details of sedation.  Attempt one:    Cardioversion mode:  Synchronous   Waveform:  Monophasic   Shock (Joules):  150   Shock outcome:  Conversion to normal sinus rhythm Post-procedure details:    Patient status:  Awake   Patient tolerance of procedure:  Tolerated well, no immediate complications   ED  Course / MDM   Clinical Course as of 09/18/22 1506  Wed Sep 18, 2022  1344 DG Chest 2 View [KS]    Clinical Course User Index [KS] Tennis Ship   Medical Decision Making Amount and/or Complexity of Data Reviewed Labs: ordered. Radiology: ordered.   Spoke with Dr. Elberta Fortis, he recommends either cardioversion in the ER, versus follow-up in A-fib clinic.  Discussed with patient, she would prefer cardioversion in the ER, we discussed risk associated with this as well as benefits, she voiced understanding is willing to undergo the procedure.  Informed consent signed, performed cardioversion with attending Dr. Durwin Nora, patient tolerated well, mild bradycardia afterwards, however this appears to be baseline for her and occurred after her last cardioversion.  She initially felt a little bit shaken afterwards, so we gave her some more fluids, and that she requested to go home after feeling much better.  She was monitored for at least 45 minutes afterwards and provided with information for moderate sedation, information, moderate sedation performed by Dr. Durwin Nora.      Pete Pelt, [place] 09/18/22 2258    Gloris Manchester, MD 09/19/22 Moses Manners

## 2022-09-18 NOTE — ED Provider Notes (Signed)
[place] EMERGENCY DEPARTMENT AT [facility] Provider Note   CSN: [id number] Arrival date & time: 09/18/22  1208     History  Chief Complaint  Patient presents with   Palpitations    Priyana Tienda is a 57 y.o. female.  The history is provided by the patient and medical records. No language interpreter was used.  Palpitations   Patient is a 57 year old female with history of paroxysmal atrial fibrillation on xarelto, HTN, hyperlipidemia, and diabetes presenting with complaints of palpitations and feeling her heart skip beats. She states palpitations began yesterday and have been ongoing since. Her apple watch reported atrial fibrillation this morning. She reports fatigue, nausea, HA, and some shortness of breath with exertion. She was recently in ED 08/13/2022 for atrial fibrillation and underwent cardioversion. She reports her symptoms today are similar to when she is in atrial fibrillation. She sees Dr. Elberta Fortis and has an ablation scheduled for August 2024. Reports medication compliance and has not missed of dose of her xarelto. Denies any chest pain, abdominal pain.    Home Medications Prior to Admission medications   Medication Sig Start Date End Date Taking? Authorizing Provider  calcium-vitamin D (OSCAL WITH D) 500-200 MG-UNIT per tablet Take 1 tablet by mouth daily.    [provider]  doxycycline (VIBRA-TABS) 100 MG tablet Take 1 tablet (100 mg total) by mouth 2 (two) times daily. 08/13/22   [name], Fritzi Mandes, MD  estradiol (ESTRACE) 1 MG tablet TAKE 1 TABLET EVERY DAY 03/18/22   [name], Kirsten, MD  ezetimibe (ZETIA) 10 MG tablet TAKE 1 TABLET (10 MG TOTAL) BY MOUTH DAILY. 05/07/22   Marianne Sofia, PA-C  finasteride (PROSCAR) 5 MG tablet Take 1 tablet (5 mg total) by mouth daily. 05/22/22   CoxFritzi Mandes, MD  fish oil-omega-3 fatty acids 1000 MG capsule Take 1 g by mouth in the morning and at bedtime.    [provider]  gabapentin (NEURONTIN) 300 MG  capsule TAKE 2 CAPSULES AT BEDTIME 07/29/22   [name], Kirsten, MD  Glucosamine-Chondroit-Vit C-Mn (GLUCOSAMINE 1500 COMPLEX PO) Take 2 tablets by mouth at bedtime.    [provider]  rivaroxaban (XARELTO) 20 MG TABS tablet TAKE 1 TABLET BY MOUTH ONCE DAILY WITH SUPPER 04/22/22   Camnitz, Andree Coss, MD  ustekinumab (STELARA) 90 MG/ML SOSY injection Inject 90 mg into the skin every 8 (eight) weeks.    [provider]  zolpidem (AMBIEN) 5 MG tablet TAKE 1 TABLET BY MOUTH EVERY DAY AT BEDTIME AS NEEDED FOR SLEEP 03/15/22   [name], Kirsten, MD      Allergies    Mercaptopurine, Amitriptyline, Crestor [rosuvastatin calcium], Metoprolol, Trazodone and nefazodone, Zocor [simvastatin], Morphine and related, and Penicillins    Review of Systems   Review of Systems  Cardiovascular:  Positive for palpitations.  All other systems reviewed and are negative.   Physical Exam Updated Vital Signs BP 132/76   Pulse 80   Temp 97.9 F (36.6 C)   Resp 16   LMP 09/23/2012   SpO2 99%  Physical Exam Vitals and nursing note reviewed.  Constitutional:      General: She is not in acute distress.    Appearance: She is well-developed.  HENT:     Head: Atraumatic.  Eyes:     Conjunctiva/sclera: Conjunctivae normal.  Neck:     Comments: No thyromegaly Cardiovascular:     Rate and Rhythm: Rhythm irregular.  Pulmonary:     Effort: Pulmonary effort is normal.  Breath sounds: Normal breath sounds. No wheezing, rhonchi or rales.  Abdominal:     Palpations: Abdomen is soft.     Tenderness: There is no abdominal tenderness.  Musculoskeletal:        General: No swelling.     Cervical back: Neck supple.     Right lower leg: No edema.     Left lower leg: No edema.  Skin:    Findings: No rash.  Neurological:     Mental Status: She is alert and oriented to person, place, and time.  Psychiatric:        Mood and Affect: Mood normal.     ED Results / Procedures / Treatments   Labs (all  labs ordered are listed, but only abnormal results are displayed) Labs Reviewed  BASIC METABOLIC PANEL - Abnormal; Notable for the following components:      Result Value   Glucose, Bld 166 (*)    All other components within normal limits  CBC - Abnormal; Notable for the following components:   HCT 47.3 (*)    All other components within normal limits  BRAIN NATRIURETIC PEPTIDE - Abnormal; Notable for the following components:   B Natriuretic Peptide 315.9 (*)    All other components within normal limits  TSH  TROPONIN I (HIGH SENSITIVITY)  TROPONIN I (HIGH SENSITIVITY)    EKG EKG Interpretation  Date/Time:  Wednesday Sep 18 2022 12:15:06 EDT Ventricular Rate:  88 PR Interval:    QRS Duration: 70 QT Interval:  364 QTC Calculation: 440 R Axis:   47 Text Interpretation: Atrial fibrillation Possible Anterior infarct , age undetermined Abnormal ECG When compared with ECG of 14-Aug-2022 00:07, PREVIOUS ECG IS PRESENT Confirmed by Eber Hong ([id number]) on 09/18/2022 2:10:58 PM  Radiology DG Chest 2 View  Result Date: 09/18/2022 CLINICAL DATA:  Palpitations EXAM: CHEST - 2 VIEW COMPARISON:  08/13/2022 FINDINGS: The heart size and mediastinal contours are within normal limits. Both lungs are clear. The visualized skeletal structures are unremarkable. IMPRESSION: Negative chest. Electronically Signed   By: Tiburcio Pea M.D.   On: 09/18/2022 12:51    Procedures Procedures    Medications Ordered in ED Medications  ondansetron (ZOFRAN) injection 4 mg (4 mg Intravenous Given 09/18/22 1818)  etomidate (AMIDATE) injection 8 mg (8 mg Intravenous Given 09/18/22 1819)  lactated ringers bolus 1,000 mL (0 mLs Intravenous Stopped 09/18/22 1939)  etomidate (AMIDATE) injection (8 mg Intravenous Given 09/18/22 1819)    ED Course/ Medical Decision Making/ A&P Clinical Course as of 09/18/22 1350  Wed Sep 18, 2022  1344 DG Chest 2 View [KS]    Clinical Course User Index [KS] Tennis Ship                             Medical Decision Making Amount and/or Complexity of Data Reviewed Labs: ordered. Radiology: ordered.   BP 132/76   Pulse 80   Temp 97.9 F (36.6 C)   Resp 16   LMP 09/23/2012   SpO2 99%   2:05 PM Patient is a 57 year old female with history of paroxysmal atrial fibrillation on xarelto, HTN, hyperlipidemia, and diabetes presenting with complaints of palpitations and feeling her heart skip beats. She states palpitations began yesterday and have been ongoing since. Her apple watch reported atrial fibrillation this morning. She reports fatigue, nausea, HA, and some shortness of breath with exertion. She was recently in ED 08/13/2022 for atrial  fibrillation and underwent cardioversion. She reports her symptoms today are similar to when she is in atrial fibrillation. She sees Dr. Elberta Fortis and has an ablation scheduled for August 2024. Reports medication compliance and has not missed of dose of her xarelto. Denies any chest pain, abdominal pain.  On exam this is a well-appearing female laying bed appears to be in no acute discomfort.  She has no evidence of thyromegaly.  Heart with regular rate and rhythm, lungs are clear to auscultation bilaterally abdomen is soft nontender no peripheral edema noted.  Vital signs reviewed and overall reassuring.  EKG shows evidence of A-fib, rate controlled.  Pt sign out to oncoming provider while awaits Cardiology consultation to determine if pt needs cardioversion or outpt f/u        Final Clinical Impression(s) / ED Diagnoses Final diagnoses:  Paroxysmal atrial fibrillation (HCC)  Encounter for cardioversion procedure    Rx / DC Orders ED Discharge Orders     None         Fayrene Helper, PA-C 09/19/22 1610    Eber Hong, MD 09/23/22 1011

## 2022-09-18 NOTE — Discharge Instructions (Addendum)
Please follow-up with your cardiologist, return to the ER if you have palpitations, shortness of breath, chest pain.  Today you had a cardioversion so for the next 24 hours, please do not make any big decisions, as you were sedated, this may reduce cause increased fatigue.  Continue taking your Xarelto, and do not miss any doses.

## 2022-09-19 ENCOUNTER — Telehealth: Payer: Self-pay | Admitting: Cardiology

## 2022-09-19 NOTE — ED Provider Notes (Signed)
.  Sedation  Date/Time: 09/19/2022 12:22 AM  Performed by: Gloris Manchester, MD Authorized by: Gloris Manchester, MD   Consent:    Consent obtained:  Verbal and written   Consent given by:  Patient   Risks discussed:  Nausea, vomiting, respiratory compromise necessitating ventilatory assistance and intubation and inadequate sedation Universal protocol:    Immediately prior to procedure, a time out was called: yes     Patient identity confirmed:  Arm band and verbally with patient Indications:    Procedure performed:  Cardioversion   Procedure necessitating sedation performed by:  Different physician Pre-sedation assessment:    Time since last food or drink:  7 hours   ASA classification: class 3 - patient with severe systemic disease     Mouth opening:  2 finger widths   Thyromental distance:  3 finger widths   Mallampati score:  II - soft palate, uvula, fauces visible   Neck mobility: normal     Pre-sedation assessments completed and reviewed: airway patency, cardiovascular function, hydration status, mental status, nausea/vomiting, pain level and respiratory function     Pre-sedation assessment completed:  09/18/2022 6:30 PM Immediate pre-procedure details:    Reassessment: Patient reassessed immediately prior to procedure     Reviewed: vital signs     Verified: bag valve mask available, emergency equipment available, intubation equipment available, IV patency confirmed and oxygen available   Procedure details (see MAR for exact dosages):    Preoxygenation:  Nasal cannula   Sedation:  Etomidate   Intended level of sedation: moderate (conscious sedation)   Intra-procedure monitoring:  Blood pressure monitoring, cardiac monitor, continuous capnometry, continuous pulse oximetry, frequent LOC assessments and frequent vital sign checks   Intra-procedure events: none     Total Provider sedation time (minutes):  11 Post-procedure details:    Post-sedation assessment completed:  09/18/2022 7:35 PM    Attendance: Constant attendance by certified staff until patient recovered     Recovery: Patient returned to pre-procedure baseline     Post-sedation assessments completed and reviewed: airway patency, cardiovascular function, hydration status, mental status, nausea/vomiting, pain level and respiratory function     Patient is stable for discharge or admission: yes     Procedure completion:  Tolerated well, no immediate complications     Gloris Manchester, MD 09/19/22 1610

## 2022-09-19 NOTE — Telephone Encounter (Signed)
Pt was just lying in bed wtaching TV when episode occurred night before last. She is still tired/fatigued.  Aware office will call to arrange follow up with Dr. Elberta Fortis or EP APP in one month for post ED/DCCV follow up. Patient verbalized understanding and agreeable to plan.

## 2022-09-19 NOTE — Telephone Encounter (Signed)
Patient called stating she had a cardioversion yesterday in the emergency room.  They advised her to give [place] a call to find out if Dr. Elberta Fortis would want to see her prior to ablation that is scheduled for 8/29.  Please advise.

## 2022-09-20 NOTE — Telephone Encounter (Signed)
Pt reports she if feeling better than she was this morning. She awoke dizzy w/ low HRs, 43, 52, 53. BP 137/63, 139/62, 144/51. She is not sure of the cause of this occurrence this morning. She has been under stress lately and understands this may be triggering her afib. Although she does not think she has been in afib since we spoke Wednesday. She will continue monitoring for now and will call office back if reoccurs/worsens. She will let us know next week how she is doing. Patient verbalized understanding and agreeable to plan.    Will forward to EP scheduler to move up June OV to see EP APP/Camnitz within next several weeks if possible.

## 2022-09-25 ENCOUNTER — Telehealth: Payer: Self-pay | Admitting: *Deleted

## 2022-09-25 NOTE — Telephone Encounter (Signed)
Transition Care Management Unsuccessful Follow-up Telephone Call  Date of discharge and from where:  [facility] 09/18/2022  Attempts:  1st Attempt  Reason for unsuccessful TCM follow-up call:  Left voice message

## 2022-09-27 ENCOUNTER — Telehealth: Payer: Self-pay | Admitting: *Deleted

## 2022-09-27 NOTE — Telephone Encounter (Signed)
Transition Care Management Follow-up Telephone Call Date of discharge and from where: [facility] 09/18/2022 How have you been since you were released from the hospital? 9days  Any questions or concerns? No  Items Reviewed: Did the pt receive and understand the discharge instructions provided? Yes  Medications obtained and verified? Yes  Other? No  Any new allergies since your discharge? No  Dietary orders reviewed? No Do you have support at home? Yes     PCP Hospital f/u appt confirmed? Has an appt with cardio dr at the end of may   Are transportation arrangements needed? No  If their condition worsens, is the pt aware to call PCP or go to the Emergency Dept.? No Was the patient provided with contact information for the PCP's office or ED? Yes Was to pt encouraged to call back with questions or concerns? Yes

## 2022-09-30 DIAGNOSIS — K50919 Crohn's disease, unspecified, with unspecified complications: Secondary | ICD-10-CM | POA: Diagnosis not present

## 2022-10-10 ENCOUNTER — Encounter: Payer: Self-pay | Admitting: Family Medicine

## 2022-10-10 ENCOUNTER — Ambulatory Visit (INDEPENDENT_AMBULATORY_CARE_PROVIDER_SITE_OTHER): Payer: Medicare HMO | Admitting: Family Medicine

## 2022-10-10 VITALS — BP 128/60 | HR 60 | Temp 96.9°F | Resp 14 | Ht 65.5 in | Wt 176.2 lb

## 2022-10-10 DIAGNOSIS — Z6828 Body mass index (BMI) 28.0-28.9, adult: Secondary | ICD-10-CM

## 2022-10-10 DIAGNOSIS — I4811 Longstanding persistent atrial fibrillation: Secondary | ICD-10-CM | POA: Diagnosis not present

## 2022-10-10 DIAGNOSIS — E1169 Type 2 diabetes mellitus with other specified complication: Secondary | ICD-10-CM

## 2022-10-10 DIAGNOSIS — E782 Mixed hyperlipidemia: Secondary | ICD-10-CM

## 2022-10-10 DIAGNOSIS — K5080 Crohn's disease of both small and large intestine without complications: Secondary | ICD-10-CM

## 2022-10-10 DIAGNOSIS — E785 Hyperlipidemia, unspecified: Secondary | ICD-10-CM | POA: Diagnosis not present

## 2022-10-10 NOTE — Assessment & Plan Note (Signed)
Followed by GI. Taking stelara injection every 8 weeks.

## 2022-10-10 NOTE — Assessment & Plan Note (Signed)
Continue xarelto 20 mg daily. Followed by Cardiology.

## 2022-10-10 NOTE — Progress Notes (Signed)
Subjective:  Patient ID: Brittany [name], female    DOB: 1966/02/29  Age: 57 y.o. MRN: [id number]  Chief Complaint  Patient presents with   Medical Management of Chronic Issues    HPI    Afib: Taking Xarelto 20 mg daily.  Hyperlipidemia: Takes Zetia 10 mg daily, Fish oil 1000 mg  in the morning and at bedtime.  Hair loss: Finasteride 1 mg daily. Helping some.   Insomnia: xanax 0.5 mg at hs as needed.    Crohn's disease: Stelara 90 mg/ml injection every 8 weeks.  Chronic back pain with some sciatica. On gabapentin 300 mg 2 before bed.      10/10/2022    9:48 AM 08/13/2022    4:03 PM 06/13/2022    1:37 PM 11/20/2021    7:54 AM 08/25/2020    9:05 AM  Depression screen PHQ 2/9  Decreased Interest 0 0 0 0 0  Down, Depressed, Hopeless 0 0 0 0 0  PHQ - 2 Score 0 0 0 0 0  Altered sleeping 1      Tired, decreased energy 3      Change in appetite 0      Feeling bad or failure about yourself  0      Trouble concentrating 0      Moving slowly or fidgety/restless 0      Suicidal thoughts 0      PHQ-9 Score 4      Difficult doing work/chores Not difficult at all           10/10/2022    9:49 AM  GAD 7 : Generalized Anxiety Score  Nervous, Anxious, on Edge 2  Control/stop worrying 1  Worry too much - different things 1  Trouble relaxing 1  Restless 0  Easily annoyed or irritable 1  Afraid - awful might happen 0  Total GAD 7 Score 6  Anxiety Difficulty Not difficult at all      Patient Care Team: Blane Ohara, MD as PCP - General (Family Medicine) Regan Lemming, MD as PCP - Electrophysiology (Cardiology) Zettie Pho, [facility] (Pharmacist)   Review of Systems  Constitutional:  Positive for fatigue. Negative for chills and fever.  HENT:  Negative for congestion, rhinorrhea and sore throat.   Respiratory:  Negative for cough and shortness of breath.   Cardiovascular:  Positive for palpitations. Negative for chest pain.  Gastrointestinal:  Positive for  diarrhea. Negative for abdominal pain, constipation, nausea and vomiting.  Genitourinary:  Negative for dysuria and urgency.  Musculoskeletal:  Positive for back pain. Negative for myalgias.  Neurological:  Positive for dizziness. Negative for weakness, light-headedness and headaches.  Psychiatric/Behavioral:  Negative for dysphoric mood. The patient is not nervous/anxious.     Current Outpatient Medications on File Prior to Visit  Medication Sig Dispense Refill   ALPRAZolam (XANAX) 0.5 MG tablet Take 0.5 mg by mouth at bedtime as needed for anxiety.     calcium-vitamin D (OSCAL WITH D) 500-200 MG-UNIT per tablet Take 1 tablet by mouth daily.     estradiol (ESTRACE) 1 MG tablet TAKE 1 TABLET EVERY DAY 90 tablet 3   ezetimibe (ZETIA) 10 MG tablet TAKE 1 TABLET (10 MG TOTAL) BY MOUTH DAILY. 90 tablet 0   finasteride (PROSCAR) 5 MG tablet Take 1 tablet (5 mg total) by mouth daily. 90 tablet 1   fish oil-omega-3 fatty acids 1000 MG capsule Take 1 g by mouth in the morning and at bedtime.  gabapentin (NEURONTIN) 300 MG capsule TAKE 2 CAPSULES AT BEDTIME 180 capsule 3   Glucosamine-Chondroit-Vit C-Mn (GLUCOSAMINE 1500 COMPLEX PO) Take 2 tablets by mouth at bedtime.     rivaroxaban (XARELTO) 20 MG TABS tablet TAKE 1 TABLET BY MOUTH ONCE DAILY WITH SUPPER 90 tablet 1   ustekinumab (STELARA) 90 MG/ML SOSY injection Inject 90 mg into the skin every 8 (eight) weeks.     No current facility-administered medications on file prior to visit.   Past Medical History:  Diagnosis Date   BMI 26.0-26.9,adult 07/06/2019   Bradycardia 08/10/2014   Crohn's disease (HCC)    DDD (degenerative disc disease), lumbar 07/17/2017   Diabetes mellitus without complication (HCC)    Fatigue 08/10/2014   Hyperlipidemia    Hyperlipidemia associated with type 2 diabetes mellitus (HCC) 07/06/2019   Hypertension    Hypertension, essential    Mixed hyperlipidemia    Palpitations 07/06/2019   Paresthesia 07/06/2019   Past  Surgical History:  Procedure Laterality Date   ABDOMINAL HYSTERECTOMY N/A 10/20/2012   Procedure: HYSTERECTOMY ABDOMINAL;  Surgeon: Miguel Aschoff, MD;  Location: [facility];  Service: Gynecology;  Laterality: N/A;   ANAL FISSURE REPAIR  05/13/2000   APPENDECTOMY     CARDIOVERSION N/A 07/03/2021   Procedure: CARDIOVERSION;  Surgeon: Little Ishikawa, MD;  Location: [facility] ENDOSCOPY;  Service: Cardiovascular;  Laterality: N/A;   CATARACT EXTRACTION Bilateral    EYE SURGERY  05/31/2022   removed film   SALPINGOOPHORECTOMY Bilateral 10/20/2012   Procedure: SALPINGO OOPHORECTOMY;  Surgeon: Miguel Aschoff, MD;  Location: [facility] ORS;  Service: Gynecology;  Laterality: Bilateral;   SMALL INTESTINE SURGERY     reconstruction   TONSILLECTOMY      Family History  Problem Relation Age of Onset   Heart Problems Mother    Diabetes Mother    Diabetes Father    Heart Problems Father    Cancer Maternal Grandmother        lung   Peptic Ulcer Disease Other    Social History   Socioeconomic History   Marital status: Widowed    Spouse name: Not on file   Number of children: 1   Years of education: Not on file   Highest education level: Not on file  Occupational History   Not on file  Tobacco Use   Smoking status: Former    Types: Cigarettes    Quit date: 2002    Years since quitting: 22.4   Smokeless tobacco: Never   Tobacco comments:    Former smoker 02/05/22  Vaping Use   Vaping Use: Never used  Substance and Sexual Activity   Alcohol use: Yes    Alcohol/week: 1.0 standard drink of alcohol    Types: 1 Standard drinks or equivalent per week    Comment: 1 drink per month 02/05/22   Drug use: No   Sexual activity: Not on file  Other Topics Concern   Not on file  Social History Narrative   wears sunscreen, brushes and flosses daily, see's dentist bi-annually, has smoke/carbon monoxide detectors, wears a seatbelt and practices gun safety   Social Determinants of Health   Financial Resource  Strain: Low Risk  (06/12/2022)   Overall Financial Resource Strain (CARDIA)    Difficulty of Paying Living Expenses: Not very hard  Food Insecurity: No Food Insecurity (06/12/2022)   Hunger Vital Sign    Worried About Running Out of Food in the Last Year: Never true    Ran Out of Food in the  Last Year: Never true  Transportation Needs: No Transportation Needs (06/12/2022)   PRAPARE - Administrator, Civil Service (Medical): No    Lack of Transportation (Non-Medical): No  Physical Activity: Insufficiently Active (06/12/2022)   Exercise Vital Sign    Days of Exercise per Week: 2 days    Minutes of Exercise per Session: 20 min  Stress: No Stress Concern Present (06/12/2022)   [employer] of Occupational Health - Occupational Stress Questionnaire    Feeling of Stress : Only a little  Social Connections: Moderately Integrated (06/12/2022)   Social Connection and Isolation Panel [NHANES]    Frequency of Communication with Friends and Family: More than three times a week    Frequency of Social Gatherings with Friends and Family: More than three times a week    Attends Religious Services: More than 4 times per year    Active Member of [employer] or Organizations: Yes    Attends Banker Meetings: 1 to 4 times per year    Marital Status: Widowed    Objective:  BP 128/60   Pulse 60   Temp (!) 96.9 F (36.1 C)   Resp 14   Ht 5' 5.5" (1.664 m)   Wt 176 lb 3.2 oz (79.9 kg)   LMP 09/23/2012   BMI 28.88 kg/m      10/11/2022    8:35 AM 10/10/2022    9:44 AM 09/18/2022    7:41 PM  BP/Weight  Systolic BP 142 128 140  Diastolic BP 84 60 70  Wt. (Lbs) 179 176.2   BMI 29.33 kg/m2 28.88 kg/m2     Physical Exam Vitals reviewed.  Constitutional:      Appearance: Normal appearance. She is normal weight.  Neck:     Vascular: No carotid bruit.  Cardiovascular:     Rate and Rhythm: Normal rate and regular rhythm.     Heart sounds: Normal heart sounds.  Pulmonary:      Effort: Pulmonary effort is normal. No respiratory distress.     Breath sounds: Normal breath sounds.  Abdominal:     General: Abdomen is flat. Bowel sounds are normal.     Palpations: Abdomen is soft.     Tenderness: There is no abdominal tenderness.  Neurological:     Mental Status: She is alert and oriented to person, place, and time.  Psychiatric:        Mood and Affect: Mood normal.        Behavior: Behavior normal.     Diabetic Foot Exam - Simple   Simple Foot Form Diabetic Foot exam was performed with the following findings: Yes 10/10/2022 10:10 AM  Visual Inspection No deformities, no ulcerations, no other skin breakdown bilaterally: Yes Sensation Testing Intact to touch and monofilament testing bilaterally: Yes Pulse Check Posterior Tibialis and Dorsalis pulse intact bilaterally: Yes Comments      Lab Results  Component Value Date   WBC 5.6 09/18/2022   HGB 14.9 09/18/2022   HCT 47.3 (H) 09/18/2022   PLT 192 09/18/2022   GLUCOSE 166 (H) 09/18/2022   CHOL 204 (H) 10/10/2022   TRIG 100 10/10/2022   HDL 94 10/10/2022   LDLCALC 93 10/10/2022   ALT 14 06/07/2022   AST 16 06/07/2022   NA 139 09/18/2022   K 4.1 09/18/2022   CL 104 09/18/2022   CREATININE 0.86 09/18/2022   BUN 13 09/18/2022   CO2 25 09/18/2022   TSH 2.500 10/10/2022   INR 0.86 10/20/2012  HGBA1C 5.6 10/10/2022   MICROALBUR 30 08/25/2020      Assessment & Plan:    Mixed hyperlipidemia Assessment & Plan: Continue zetia 10 mg daily. Fish Oil 1000 mg twice daily.     Crohn's disease of both small and large intestine without complication (HCC) Assessment & Plan: Followed by GI. Taking stelara injection every 8 weeks.    Longstanding persistent atrial fibrillation (HCC) Assessment & Plan: Continue xarelto 20 mg daily. Followed by Cardiology.    Orders: -     TSH  Hyperlipidemia associated with type 2 diabetes mellitus (HCC) Assessment & Plan: Control: great Recommend check  feet daily. Recommend annual eye exams. Medicines: no medicines Continue to work on eating a healthy diet and exercise.  Labs drawn today.     Orders: -     Hemoglobin A1c -     Lipid panel  BMI 28.0-28.9,adult Assessment & Plan: Recommend continue to work on eating healthy diet and exercise.    Other orders -     Cardiovascular Risk Assessment     No orders of the defined types were placed in this encounter.   Orders Placed This Encounter  Procedures   Hemoglobin A1c   Lipid panel   TSH   Cardiovascular Risk Assessment     Follow-up: Return in about 6 months (around 04/12/2023) for chronic follow up, fasting.   I,Carolyn M Morrison,acting as a Neurosurgeon for Blane Ohara, MD.,have documented all relevant documentation on the behalf of Blane Ohara, MD,as directed by  Blane Ohara, MD while in the presence of Blane Ohara, MD.   Clayborn Bigness I Leal-Borjas,acting as a scribe for Blane Ohara, MD.,have documented all relevant documentation on the behalf of Blane Ohara, MD,as directed by  Blane Ohara, MD while in the presence of Blane Ohara, MD.    An After Visit Summary was printed and given to the patient.  I attest that I have reviewed this visit and agree with the plan scribed by my staff.   Blane Ohara, MD [facility] [phone]

## 2022-10-10 NOTE — Patient Instructions (Signed)
Tdap and Shingles vaccine to be done at the pharmacy.

## 2022-10-10 NOTE — Assessment & Plan Note (Signed)
Continue zetia 10 mg daily. Fish Oil 1000 mg twice daily.

## 2022-10-11 ENCOUNTER — Encounter: Payer: Self-pay | Admitting: Family Medicine

## 2022-10-11 ENCOUNTER — Encounter: Payer: Self-pay | Admitting: Cardiology

## 2022-10-11 ENCOUNTER — Ambulatory Visit: Payer: Medicare HMO | Attending: Cardiology | Admitting: Cardiology

## 2022-10-11 VITALS — BP 142/84 | HR 51 | Ht 65.5 in | Wt 179.0 lb

## 2022-10-11 DIAGNOSIS — I10 Essential (primary) hypertension: Secondary | ICD-10-CM

## 2022-10-11 DIAGNOSIS — I480 Paroxysmal atrial fibrillation: Secondary | ICD-10-CM

## 2022-10-11 LAB — LIPID PANEL
Chol/HDL Ratio: 2.2 ratio (ref 0.0–4.4)
Cholesterol, Total: 204 mg/dL — ABNORMAL HIGH (ref 100–199)
HDL: 94 mg/dL (ref >39)
LDL Chol Calc (NIH): 93 mg/dL (ref 0–99)
Triglycerides: 100 mg/dL (ref 0–149)
VLDL Cholesterol Cal: 17 mg/dL (ref 5–40)

## 2022-10-11 LAB — TSH: TSH: 2.5 u[IU]/mL (ref 0.450–4.500)

## 2022-10-11 LAB — HEMOGLOBIN A1C
Est. average glucose Bld gHb Est-mCnc: 114 mg/dL
Hgb A1c MFr Bld: 5.6 % (ref 4.8–5.6)

## 2022-10-11 NOTE — Assessment & Plan Note (Addendum)
Control: great Recommend check feet daily. Recommend annual eye exams. Medicines: no medicines Continue to work on eating a healthy diet and exercise.  Labs drawn today.

## 2022-10-11 NOTE — Patient Instructions (Signed)
You are scheduled for an AFib ablaiton 01/09/23.    The EP procedure scheduler, April, will be in touch to go over instructions.

## 2022-10-11 NOTE — Progress Notes (Signed)
Electrophysiology Office Note   Date:  10/11/2022   ID:  Brittany [name] [place], DOB 01-17-1966, MRN [id number]  PCP:  Blane Ohara, MD  Cardiologist:   Primary Electrophysiologist:  Nikolus Marczak Jorja Loa, MD    Chief Complaint: bradycardia   History of Present Illness: Brittany [name] is a 57 y.o. female who is being seen today for the evaluation of bradycardia at the request of [name], Fritzi Mandes, MD. Presenting today for electrophysiology evaluation.  She has a history seen for diabetes, hypertension, hyperlipidemia, Crohn's disease.  She presented to cardiology clinic post-COVID with palpitations and bradycardia.  She was found to have heart rates in the 40s with fatigue.  She then presented to the emergency room with atrial fibrillation.  She is post cardioversion 07/03/2022.  She again presented to the emergency room 09/04/2022 and atrial fibrillation.  She was having shortness of breath and fatigue.  She is post cardioversion.  Today, denies symptoms of palpitations, chest pain, shortness of breath, orthopnea, PND, lower extremity edema, claudication, dizziness, presyncope, syncope, bleeding, or neurologic sequela. The patient is tolerating medications without difficulties.  She had a couple episodes of atrial fibrillation in April requiring ER visits and cardioversion.  Since then, she has done well.  She is under quite a bit of stress.  She has been taking Xanax at night which has helped her sleep.   Past Medical History:  Diagnosis Date   BMI 26.0-26.9,adult 07/06/2019   Bradycardia 08/10/2014   Crohn's disease (HCC)    DDD (degenerative disc disease), lumbar 07/17/2017   Diabetes mellitus without complication (HCC)    Fatigue 08/10/2014   Hyperlipidemia    Hyperlipidemia associated with type 2 diabetes mellitus (HCC) 07/06/2019   Hypertension    Hypertension, essential    Mixed hyperlipidemia    Palpitations 07/06/2019   Paresthesia 07/06/2019   Past Surgical History:  Procedure  Laterality Date   ABDOMINAL HYSTERECTOMY N/A 10/20/2012   Procedure: HYSTERECTOMY ABDOMINAL;  Surgeon: Miguel Aschoff, MD;  Location: [facility];  Service: Gynecology;  Laterality: N/A;   ANAL FISSURE REPAIR  05/13/2000   APPENDECTOMY     CARDIOVERSION N/A 07/03/2021   Procedure: CARDIOVERSION;  Surgeon: Little Ishikawa, MD;  Location: [facility];  Service: Cardiovascular;  Laterality: N/A;   CATARACT EXTRACTION Bilateral    EYE SURGERY  05/31/2022   removed film   SALPINGOOPHORECTOMY Bilateral 10/20/2012   Procedure: SALPINGO OOPHORECTOMY;  Surgeon: Miguel Aschoff, MD;  Location: [facility];  Service: Gynecology;  Laterality: Bilateral;   SMALL INTESTINE SURGERY     reconstruction   TONSILLECTOMY       Current Outpatient Medications  Medication Sig Dispense Refill   ALPRAZolam (XANAX) 0.5 MG tablet Take 0.5 mg by mouth at bedtime as needed for anxiety.     calcium-vitamin D (OSCAL WITH D) 500-200 MG-UNIT per tablet Take 1 tablet by mouth daily.     estradiol (ESTRACE) 1 MG tablet TAKE 1 TABLET EVERY DAY 90 tablet 3   ezetimibe (ZETIA) 10 MG tablet TAKE 1 TABLET (10 MG TOTAL) BY MOUTH DAILY. 90 tablet 0   finasteride (PROSCAR) 5 MG tablet Take 1 tablet (5 mg total) by mouth daily. 90 tablet 1   fish oil-omega-3 fatty acids 1000 MG capsule Take 1 g by mouth in the morning and at bedtime.     gabapentin (NEURONTIN) 300 MG capsule TAKE 2 CAPSULES AT BEDTIME 180 capsule 3   Glucosamine-Chondroit-Vit C-Mn (GLUCOSAMINE 1500 COMPLEX PO) Take 2 tablets by mouth at bedtime.  rivaroxaban (XARELTO) 20 MG TABS tablet TAKE 1 TABLET BY MOUTH ONCE DAILY WITH SUPPER 90 tablet 1   ustekinumab (STELARA) 90 MG/ML SOSY injection Inject 90 mg into the skin every 8 (eight) weeks.     No current facility-administered medications for this visit.    Allergies:   Mercaptopurine, Amitriptyline, Crestor [rosuvastatin calcium], Metoprolol, Trazodone and nefazodone, Zocor [simvastatin], Morphine and codeine, and  Penicillins   Social History:  The patient  reports that she quit smoking about 22 years ago. Her smoking use included cigarettes. She has never used smokeless tobacco. She reports current alcohol use of about 1.0 standard drink of alcohol per week. She reports that she does not use drugs.   Family History:  The patient's family history includes Cancer in her maternal grandmother; Diabetes in her father and mother; Heart Problems in her father and mother; Peptic Ulcer Disease in an other family member.   ROS:  Please see the history of present illness.   Otherwise, review of systems is positive for none.   All other systems are reviewed and negative.   PHYSICAL EXAM: VS:  BP (!) 142/84   Pulse (!) 51   Ht 5' 5.5" (1.664 m)   Wt 179 lb (81.2 kg)   LMP 09/23/2012   SpO2 99%   BMI 29.33 kg/m  , BMI Body mass index is 29.33 kg/m. GEN: Well nourished, well developed, in no acute distress  HEENT: normal  Neck: no JVD, carotid bruits, or masses Cardiac: RRR; no murmurs, rubs, or gallops,no edema  Respiratory:  clear to auscultation bilaterally, normal work of breathing GI: soft, nontender, nondistended, + BS MS: no deformity or atrophy  Skin: warm and dry Neuro:  Strength and sensation are intact Psych: euthymic mood, full affect  EKG:  EKG is ordered today. Personal review of the ekg ordered shows sinus rhythm   Recent Labs: 01/30/2022: Magnesium 2.0 06/07/2022: ALT 14 09/18/2022: B Natriuretic Peptide 315.9; BUN 13; Creatinine, Ser 0.86; Hemoglobin 14.9; Platelets 192; Potassium 4.1; Sodium 139 10/10/2022: TSH 2.500    Lipid Panel     Component Value Date/Time   CHOL 204 (H) 10/10/2022 1010   TRIG 100 10/10/2022 1010   HDL 94 10/10/2022 1010   CHOLHDL 2.2 10/10/2022 1010   LDLCALC 93 10/10/2022 1010     Wt Readings from Last 3 Encounters:  10/11/22 179 lb (81.2 kg)  10/10/22 176 lb 3.2 oz (79.9 kg)  09/18/22 176 lb 5.9 oz (80 kg)      Other studies Reviewed: Additional  studies/ records that were reviewed today include: TTE 05/01/21 Review of the above records today demonstrates:   1. Left ventricular ejection fraction, by estimation, is 60 to 65%. The  left ventricle has normal function. The left ventricle has no regional  wall motion abnormalities. There is mild left ventricular hypertrophy of  the inferior segment. Left  ventricular diastolic parameters are indeterminate.   2. Right ventricular systolic function is normal. The right ventricular  size is normal.   3. Left atrial size was mildly dilated.   4. The mitral valve is normal in structure. Mild mitral valve  regurgitation. No evidence of mitral stenosis.   5. The aortic valve is normal in structure. Aortic valve regurgitation is  not visualized. No aortic stenosis is present.   6. The inferior vena cava is normal in size with greater than 50%  respiratory variability, suggesting right atrial pressure of 3 mmHg.    Myoview 04/18/2021 Low risk stress nuclear  study with normal perfusion and normal left ventricular regional and global systolic function.  ASSESSMENT AND PLAN:  1.  Paroxysmal atrial fibrillation: CHA2DS2-VASc of 1.  Currently on Xarelto.  Has plans for ablation 01/09/2023.  In April, she had more frequent episodes of atrial fibrillation.  Fortunately she has done well since then without any further episodes.  If she does wish a bridge to ablation, would start amiodarone.  2.  Hypertension: Mildly elevated today but usually well-controlled.  No changes.  3.  Hyperlipidemia: Diet and exercise encouraged  Current medicines are reviewed at length with the patient today.   The patient does not have concerns regarding her medicines.  The following changes were made today: None  Labs/ tests ordered today include:  Orders Placed This Encounter  Procedures   EKG 12-Lead     Disposition:   FU 6 months  Signed, Cadynce Garrette Jorja Loa, MD  10/11/2022 9:25 AM     [facility] [address] [place] [address] [phone] (office) [phone] (fax)

## 2022-10-11 NOTE — Assessment & Plan Note (Signed)
Recommend continue to work on eating healthy diet and exercise.  

## 2022-10-15 ENCOUNTER — Other Ambulatory Visit: Payer: Self-pay | Admitting: Cardiology

## 2022-10-15 DIAGNOSIS — I4819 Other persistent atrial fibrillation: Secondary | ICD-10-CM

## 2022-10-15 NOTE — Telephone Encounter (Signed)
Prescription refill request for Xarelto received.  Indication: afib  Last office visit: Camnitz, 10/11/2022 Weight: 81.2 kg  Age: 57 yo  Scr: 0.86, 09/18/2022 CrCl: 90 ml/min   Refill sent.

## 2022-10-16 ENCOUNTER — Other Ambulatory Visit: Payer: Self-pay | Admitting: Family Medicine

## 2022-10-16 DIAGNOSIS — E782 Mixed hyperlipidemia: Secondary | ICD-10-CM

## 2022-10-17 ENCOUNTER — Other Ambulatory Visit: Payer: Self-pay

## 2022-10-17 MED ORDER — ALPRAZOLAM 0.5 MG PO TABS: 0.5000 mg | ORAL_TABLET | Freq: Every evening | ORAL | 5 refills | Status: DC | PRN

## 2022-10-17 NOTE — Telephone Encounter (Signed)
Patient seen in office 5/30 - she forgot to request refill for Xanax 0.5 mg.  She states that she takes this to sleep instead of Ambien.  Please send to [facility] in [place].

## 2022-10-23 ENCOUNTER — Other Ambulatory Visit: Payer: Self-pay | Admitting: Family Medicine

## 2022-10-25 ENCOUNTER — Ambulatory Visit: Payer: Medicare HMO | Admitting: Student

## 2022-10-29 DIAGNOSIS — Z1231 Encounter for screening mammogram for malignant neoplasm of breast: Secondary | ICD-10-CM | POA: Diagnosis not present

## 2022-11-01 ENCOUNTER — Telehealth: Payer: Self-pay | Admitting: Cardiology

## 2022-11-01 ENCOUNTER — Other Ambulatory Visit: Payer: Self-pay

## 2022-11-01 ENCOUNTER — Emergency Department (HOSPITAL_COMMUNITY): Payer: Medicare HMO

## 2022-11-01 ENCOUNTER — Emergency Department (HOSPITAL_COMMUNITY)
Admission: EM | Admit: 2022-11-01 | Discharge: 2022-11-01 | Disposition: A | Discharge status: Home / Self Care | Payer: Medicare HMO | Attending: Emergency Medicine | Admitting: Emergency Medicine

## 2022-11-01 ENCOUNTER — Encounter (HOSPITAL_COMMUNITY): Payer: Self-pay

## 2022-11-01 DIAGNOSIS — R0602 Shortness of breath: Secondary | ICD-10-CM | POA: Insufficient documentation

## 2022-11-01 DIAGNOSIS — I10 Essential (primary) hypertension: Secondary | ICD-10-CM | POA: Diagnosis not present

## 2022-11-01 DIAGNOSIS — Z87891 Personal history of nicotine dependence: Secondary | ICD-10-CM | POA: Diagnosis not present

## 2022-11-01 DIAGNOSIS — E119 Type 2 diabetes mellitus without complications: Secondary | ICD-10-CM | POA: Insufficient documentation

## 2022-11-01 LAB — CBC
HCT: 47.5 % — ABNORMAL HIGH (ref 36.0–46.0)
Hemoglobin: 15 g/dL (ref 12.0–15.0)
MCH: 29.2 pg (ref 26.0–34.0)
MCHC: 31.6 g/dL (ref 30.0–36.0)
MCV: 92.4 fL (ref 80.0–100.0)
Platelets: 206 10*3/uL (ref 150–400)
RBC: 5.14 MIL/uL — ABNORMAL HIGH (ref 3.87–5.11)
RDW: 14.3 % (ref 11.5–15.5)
WBC: 7.3 10*3/uL (ref 4.0–10.5)
nRBC: 0 % (ref 0.0–0.2)

## 2022-11-01 LAB — BASIC METABOLIC PANEL
Anion gap: 10 (ref 5–15)
BUN: 12 mg/dL (ref 6–20)
CO2: 29 mmol/L (ref 22–32)
Calcium: 9.4 mg/dL (ref 8.9–10.3)
Chloride: 102 mmol/L (ref 98–111)
Creatinine, Ser: 0.82 mg/dL (ref 0.44–1.00)
GFR, Estimated: >60 mL/min (ref >60)
Glucose, Bld: 82 mg/dL (ref 70–99)
Potassium: 4.5 mmol/L (ref 3.5–5.1)
Sodium: 141 mmol/L (ref 135–145)

## 2022-11-01 LAB — TROPONIN I (HIGH SENSITIVITY)
Troponin I (High Sensitivity): 6 ng/L (ref <18)
Troponin I (High Sensitivity): 7 ng/L (ref <18)

## 2022-11-01 NOTE — ED Triage Notes (Signed)
Pt arrived POV. Pt has hx of afib requiring cardioversion monthly. Pt having fatigue, bilateral arm heaviness, shob, malaise, labile emotions. Pt's daughter reports pt had lost all color to her face this morning. Pt and family anxious d/t strong cardiac hx in the family.

## 2022-11-01 NOTE — Telephone Encounter (Signed)
Spoke to pt. Reports she is currently in ED. States they found a 1 AVB on EKG and this really concerned her, but they educated her to ease her concern that it is not life threatening. I told her same thing as well. Aware will be on the look out for EKG (not released yet) and lab results to ensure Camnitz sees them as well. Not sure that any follow up is needed, but will await MD review of ED visit.

## 2022-11-01 NOTE — ED Provider Notes (Signed)
[place] EMERGENCY DEPARTMENT AT [facility] Provider Note   HPI: Brittany [name] is a 57 year old female with a past medical history as below including A-fib on anticoagulation presenting today with worsening shortness of breath.  She reports she felt a funny sensation in her chest today and feels when she has had this in the past she feels like she is in atrial fibrillation.  She denies any chest pain.  She denies any worsening lower extremity swelling.  She denies any lightheadedness, visual changes, nausea, vomiting, abdominal pain.  Due to persistence of her symptoms she presented for further evaluation.  She denies any fevers, chills, cough.  She has not had any recent surgeries, history of PE/DVT, has been compliant with her Eliquis, no hemoptysis, no cancer history, no hormonal medication use.  Past Medical History:  Diagnosis Date   BMI 26.0-26.9,adult 07/06/2019   Bradycardia 08/10/2014   Crohn's disease (HCC)    DDD (degenerative disc disease), lumbar 07/17/2017   Diabetes mellitus without complication (HCC)    Fatigue 08/10/2014   Hyperlipidemia    Hyperlipidemia associated with type 2 diabetes mellitus (HCC) 07/06/2019   Hypertension    Hypertension, essential    Mixed hyperlipidemia    Palpitations 07/06/2019   Paresthesia 07/06/2019    Past Surgical History:  Procedure Laterality Date   ABDOMINAL HYSTERECTOMY N/A 10/20/2012   Procedure: HYSTERECTOMY ABDOMINAL;  Surgeon: Miguel Aschoff, MD;  Location: [facility];  Service: Gynecology;  Laterality: N/A;   ANAL FISSURE REPAIR  05/13/2000   APPENDECTOMY     CARDIOVERSION N/A 07/03/2021   Procedure: CARDIOVERSION;  Surgeon: Little Ishikawa, MD;  Location: [facility] ENDOSCOPY;  Service: Cardiovascular;  Laterality: N/A;   CATARACT EXTRACTION Bilateral    EYE SURGERY  05/31/2022   removed film   SALPINGOOPHORECTOMY Bilateral 10/20/2012   Procedure: SALPINGO OOPHORECTOMY;  Surgeon: Miguel Aschoff, MD;  Location: [facility];   Service: Gynecology;  Laterality: Bilateral;   SMALL INTESTINE SURGERY     reconstruction   TONSILLECTOMY       Social History   Tobacco Use   Smoking status: Former    Types: Cigarettes    Quit date: 2002    Years since quitting: 22.4   Smokeless tobacco: Never   Tobacco comments:    Former smoker 02/05/22  Vaping Use   Vaping Use: Never used  Substance Use Topics   Alcohol use: Yes    Alcohol/week: 1.0 standard drink of alcohol    Types: 1 Standard drinks or equivalent per week    Comment: 1 drink per month 02/05/22   Drug use: No      Review of Systems  A complete ROS was performed with pertinent positives/negatives noted in the HPI.   Vitals:   11/01/22 1757 11/01/22 1815  BP:  136/62  Pulse:  (!) 102  Resp:  11  Temp: 97.6 F (36.4 C)   SpO2:  99%    Physical Exam Vitals and nursing note reviewed.  Constitutional:      General: She is not in acute distress.    Appearance: She is well-developed. She is not ill-appearing.  Cardiovascular:     Rate and Rhythm: Normal rate and regular rhythm.     Pulses: Normal pulses.          Radial pulses are 2+ on the right side and 2+ on the left side.       Dorsalis pedis pulses are 2+ on the right side and 2+ on the  left side.     Heart sounds: Normal heart sounds. No murmur heard.    No friction rub. No gallop.  Pulmonary:     Effort: Pulmonary effort is normal. No respiratory distress.     Breath sounds: Normal breath sounds. No stridor. No wheezing, rhonchi or rales.  Abdominal:     General: Abdomen is flat. There is no distension.     Palpations: Abdomen is soft.     Tenderness: There is no abdominal tenderness. There is no guarding or rebound.  Musculoskeletal:        General: No swelling.  Skin:    General: Skin is warm and dry.     Capillary Refill: Capillary refill takes less than 2 seconds.  Neurological:     Mental Status: She is alert.     Procedures  MDM:  Imaging/radiology results:  DG  Chest 2 View  Result Date: 11/01/2022 CLINICAL DATA:  Shortness of breath EXAM: CHEST - 2 VIEW COMPARISON:  09/18/2022 FINDINGS: Cardiac size is within normal limits. There are no signs of pulmonary edema or focal pulmonary consolidation. Increase in AP diameter of chest may be due to patient's body habitus. There is no pleural effusion or pneumothorax. Surgical clips are seen in gallbladder fossa. IMPRESSION: No active cardiopulmonary disease. Electronically Signed   By: Ernie Avena M.D.   On: 11/01/2022 14:17      EKG results: Initial EKG shows atrial tachycardia/flutter without RVR.  No evidence of STEMI or ischemic equivalent.  Repeat EKG shows sinus bradycardia.  No evidence of STEMI or ischemic equivalent.    Lab results:  Results for orders placed or performed during the hospital encounter of 11/01/22 (from the past 24 hour(s))  Basic metabolic panel     Status: None   Collection Time: 11/01/22  1:49 PM  Result Value Ref Range   Sodium 141 135 - 145 mmol/L   Potassium 4.5 3.5 - 5.1 mmol/L   Chloride 102 98 - 111 mmol/L   CO2 29 22 - 32 mmol/L   Glucose, Bld 82 70 - 99 mg/dL   BUN 12 6 - 20 mg/dL   Creatinine, Ser 9.56 0.44 - 1.00 mg/dL   Calcium 9.4 8.9 - 21.3 mg/dL   GFR, Estimated >08 >65 mL/min   Anion gap 10 5 - 15  CBC     Status: Abnormal   Collection Time: 11/01/22  1:49 PM  Result Value Ref Range   WBC 7.3 4.0 - 10.5 K/uL   RBC 5.14 (H) 3.87 - 5.11 MIL/uL   Hemoglobin 15.0 12.0 - 15.0 g/dL   HCT 78.4 (H) 69.6 - 29.5 %   MCV 92.4 80.0 - 100.0 fL   MCH 29.2 26.0 - 34.0 pg   MCHC 31.6 30.0 - 36.0 g/dL   RDW 28.4 13.2 - 44.0 %   Platelets 206 150 - 400 K/uL   nRBC 0.0 0.0 - 0.2 %  Troponin I (High Sensitivity)     Status: None   Collection Time: 11/01/22  1:49 PM  Result Value Ref Range   Troponin I (High Sensitivity) 6 <18 ng/L  Troponin I (High Sensitivity)     Status: None   Collection Time: 11/01/22  3:59 PM  Result Value Ref Range   Troponin I  (High Sensitivity) 7 <18 ng/L   Medical decision making: -Vital signs stable. Patient afebrile, hemodynamically stable, and non-toxic appearing. -Patient's presentation is most consistent with acute complicated illness / injury requiring diagnostic workup.. De Blanch Stigall  is a 57 y.o. female presenting to the emergency department with shortness of breath.  -Per chart review, patient called her cardiology office today and informed them that she is here.  She follows with Dr. Elberta Fortis who reportedly we will be reviewing her EKG from the ed -Initial troponin 6. Second troponin 7. Delta troponin 1. EKG I obtained reveals no anatomical ischemia representing STEMI or ischemic equivalent. Patient was appropriately risk stratified with HEART score of 4.  Given reassuring workup and lack of chest pain/shortness of breath at present do not suspect ACS at this time. CXR unremarkable for focal airspace disease, patient is afebrile, no cough, do not suspect Pneumonia. CXR without evidence of Pneumothorax. No concerns for Pericardial Tamponade on EKG and in light of patients hemodynamic stability doubt this pathology. No pain related to supine or prone positions and given EKG doubt Pericarditis. Unlikely myocarditis, does not fit clinical picture, no EKG findings to support. Unlikely Pulmonary Embolism as well's score, low probability and patient is anticoagulated. Therefore will not obtain CTA Chest or D-dimer. No lower extremity edema, CXR without pulmonary edema, patient saturating appropriate on room air, doubt CHF exacerbation.  Patient has close cardiology follow-up therefore I believe she is appropriate for outpatient management. -I have discussed the results, diagnosis, treatment plan, follow up recommendations, and return precautions with the patient. They verbalized understanding.  No further questions at the time of discharge.     Medical Decision Making Amount and/or Complexity of Data  Reviewed Labs: ordered. Decision-making details documented in ED Course. Radiology: ordered. ECG/medicine tests: ordered and independent interpretation performed. Decision-making details documented in ED Course.     The plan for this patient was discussed with Dr. Lockie Mola, who voiced agreement and who oversaw evaluation and treatment of this patient.  Marta Lamas, MD Emergency Medicine, PGY-3  Note: Dragon medical dictation software was used in the creation of this note.   Clinical Impression:  1. Shortness of breath          Chase Caller, MD 11/01/22 1939    Virgina Norfolk, DO 11/01/22 2019

## 2022-11-01 NOTE — Telephone Encounter (Signed)
Patient c/o Palpitations:  High priority if patient c/o lightheadedness, shortness of breath, or chest pain  How long have you had palpitations/irregular HR/ Afib? Are you having the symptoms now? Out of rhythm since this morning   Are you currently experiencing lightheadedness, SOB or CP? A little bit of SOB   Do you have a history of afib (atrial fibrillation) or irregular heart rhythm? Yes   Have you checked your BP or HR? (document readings if available): 79 HR   Are you experiencing any other symptoms? Fatigue and winded

## 2022-11-01 NOTE — Telephone Encounter (Signed)
Returned call and spoke with patient. She states she woke up this morning and "felt funny" in her chest. She had a cup of coffee (which she has done every morning before).  Reports she feels winded with activity and fatigued despite sleeping well last night.  Patient states her daughter is taking her to [facility] ED right now for evaluation.  She asked about possibly moving her ablation up, and states she hasn't heard anything about having an echocardiogram done that is needed prior to ablation (may be referring to TEE?)  Will forward to Dr. Elberta Fortis and his nurse Roanna Raider.

## 2022-11-01 NOTE — ED Provider Notes (Signed)
Patient here with concerns for palpitations.  Unremarkable vitals upon arrival.  Looks like her initial EKG showed rate controlled atrial tachycardia/flutter.  She is on anticoagulation.  She does have ablation next month.  She is not having any chest pain or shortness of breath and on my evaluation she feels like heart rate is back in normal rhythm which on repeat EKG does show sinus bradycardia.  No medications due to low heart rate when she is in a normal rhythm.  Lab work collected and shows no evidence of anemia, electrolyte abnormality, kidney injury.  Troponin negative x 2.  At this time she is asymptomatic.  Discharged with cardiology follow-up.  This chart was dictated using voice recognition software.  Despite best efforts to proofread,  errors can occur which can change the documentation meaning.    Virgina Norfolk, DO 11/01/22 1648

## 2022-11-01 NOTE — Discharge Instructions (Addendum)
**Note Brittany-Identified via Obfuscation** Brittany [name]:  Thank you for allowing [place] to take care of you today.  We hope you begin feeling better soon.  To-Do: Please follow-up with cardiology. Please return to the Emergency Department or call 911 if you experience chest pain, shortness of breath, severe pain, severe fever, altered mental status, or have any reason to think that you need emergency medical care.  Thank you again.  Hope you feel better soon.  Department of Emergency Medicine [facility]

## 2022-11-04 NOTE — Telephone Encounter (Signed)
Made aware of MD review/recommendation. She will continue monitoring things at home and will reach back out if she feels she needs to be seen prior to August ablation. She appreciates my call.

## 2022-11-04 NOTE — Telephone Encounter (Signed)
Pt calling in following her ED visit. She wants to know if she should f/u sooner or not.

## 2022-11-18 ENCOUNTER — Telehealth: Payer: Self-pay

## 2022-11-18 DIAGNOSIS — I480 Paroxysmal atrial fibrillation: Secondary | ICD-10-CM

## 2022-11-18 NOTE — Telephone Encounter (Signed)
Pt is scheduled on 01/09/23 for her Afib Ablation with Dr. Elberta Fortis.  She will have labs drawn at the [place] office between 8/12-13...  She is aware that someone will contact her to schedule her CT Scan.  I will mail her instruction letters via pt's request.

## 2022-11-25 DIAGNOSIS — K50919 Crohn's disease, unspecified, with unspecified complications: Secondary | ICD-10-CM | POA: Diagnosis not present

## 2022-12-04 ENCOUNTER — Other Ambulatory Visit: Payer: Self-pay | Admitting: Family Medicine

## 2022-12-04 DIAGNOSIS — E1169 Type 2 diabetes mellitus with other specified complication: Secondary | ICD-10-CM

## 2022-12-23 ENCOUNTER — Other Ambulatory Visit: Payer: Self-pay

## 2022-12-23 DIAGNOSIS — I480 Paroxysmal atrial fibrillation: Secondary | ICD-10-CM | POA: Diagnosis not present

## 2022-12-31 ENCOUNTER — Ambulatory Visit (HOSPITAL_COMMUNITY)
Admission: RE | Admit: 2022-12-31 | Discharge: 2022-12-31 | Disposition: A | Discharge status: Home / Self Care | Payer: Medicare HMO | Source: Ambulatory Visit | Attending: Cardiology | Admitting: Cardiology

## 2022-12-31 DIAGNOSIS — I480 Paroxysmal atrial fibrillation: Secondary | ICD-10-CM | POA: Insufficient documentation

## 2022-12-31 DIAGNOSIS — I517 Cardiomegaly: Secondary | ICD-10-CM | POA: Diagnosis not present

## 2022-12-31 DIAGNOSIS — Z4682 Encounter for fitting and adjustment of non-vascular catheter: Secondary | ICD-10-CM | POA: Diagnosis not present

## 2022-12-31 MED ORDER — IOHEXOL 350 MG/ML SOLN
100.0000 mL | Freq: Once | INTRAVENOUS | Status: AC | PRN
  Administered 2022-12-31: 100 mL via INTRAVENOUS

## 2023-01-01 ENCOUNTER — Other Ambulatory Visit: Payer: Self-pay

## 2023-01-01 DIAGNOSIS — E782 Mixed hyperlipidemia: Secondary | ICD-10-CM

## 2023-01-02 ENCOUNTER — Other Ambulatory Visit: Payer: Medicare HMO

## 2023-01-02 ENCOUNTER — Telehealth: Payer: Self-pay | Admitting: Cardiology

## 2023-01-02 DIAGNOSIS — E782 Mixed hyperlipidemia: Secondary | ICD-10-CM

## 2023-01-02 NOTE — Telephone Encounter (Signed)
New Message:       Patient says she is scheduled for an Ablation next week. She said she would like for Dr Elberta Fortis or his nurse to please call her. She says she need to ask some questions.

## 2023-01-02 NOTE — Telephone Encounter (Signed)
Pt is really nervous about upcoming ablation next week. She saw her mom go though "5 ablations" and just does not want to go through it as well. We spoke for about 15 minutes and discussed procedure. After discussion, pt still anxious, but would like to proceed. Encouraged her to call back with any further questions/concerns. Patient verbalized understanding and agreeable to plan.

## 2023-01-03 LAB — LIPID PANEL
Chol/HDL Ratio: 2.6 ratio (ref 0.0–4.4)
Cholesterol, Total: 211 mg/dL — ABNORMAL HIGH (ref 100–199)
HDL: 81 mg/dL (ref >39)
LDL Chol Calc (NIH): 112 mg/dL — ABNORMAL HIGH (ref 0–99)
Triglycerides: 104 mg/dL (ref 0–149)
VLDL Cholesterol Cal: 18 mg/dL (ref 5–40)

## 2023-01-08 NOTE — Anesthesia Preprocedure Evaluation (Signed)
Anesthesia Evaluation  Patient identified by MRN, date of birth, ID band Patient awake    Reviewed: Allergy & Precautions, NPO status , Patient's Chart, lab work & pertinent test results  History of Anesthesia Complications Negative for: history of anesthetic complications  Airway Mallampati: II  TM Distance: >3 FB Neck ROM: Full    Dental no notable dental hx.    Pulmonary former smoker   Pulmonary exam normal        Cardiovascular hypertension, Normal cardiovascular exam+ dysrhythmias (on Xarelto) Atrial Fibrillation      Neuro/Psych negative neurological ROS  negative psych ROS   GI/Hepatic negative GI ROS, Neg liver ROS,,,  Endo/Other  diabetes, Type 2    Renal/GU negative Renal ROS  negative genitourinary   Musculoskeletal  (+) Arthritis ,    Abdominal   Peds  Hematology negative hematology ROS (+)   Anesthesia Other Findings Day of surgery medications reviewed with patient.  Reproductive/Obstetrics negative OB ROS                              Anesthesia Physical Anesthesia Plan  ASA: 3  Anesthesia Plan: General   Post-op Pain Management: Tylenol PO (pre-op)*   Induction: Intravenous  PONV Risk Score and Plan: 3 and Treatment may vary due to age or medical condition, Ondansetron, Dexamethasone and Midazolam  Airway Management Planned: Oral ETT  Additional Equipment:   Intra-op Plan:   Post-operative Plan: Extubation in OR  Informed Consent: I have reviewed the patients History and Physical, chart, labs and discussed the procedure including the risks, benefits and alternatives for the proposed anesthesia with the patient or authorized representative who has indicated his/her understanding and acceptance.     Dental advisory given  Plan Discussed with: CRNA  Anesthesia Plan Comments:         Anesthesia Quick Evaluation

## 2023-01-08 NOTE — Pre-Procedure Instructions (Signed)
Instructed patient on the following items: Arrival time 0515 Nothing to eat or drink after midnight No meds AM of procedure Responsible person to drive you home and stay with you for 24 hrs  Have you missed any doses of anti-coagulant Xarelto- hasn't missed any doses, takes once a day.

## 2023-01-09 ENCOUNTER — Encounter (HOSPITAL_COMMUNITY)
Admission: RE | Disposition: A | Discharge status: Home / Self Care | Payer: Self-pay | Source: Home / Self Care | Attending: Cardiology

## 2023-01-09 ENCOUNTER — Ambulatory Visit (HOSPITAL_COMMUNITY): Payer: Medicare HMO | Admitting: Anesthesiology

## 2023-01-09 ENCOUNTER — Ambulatory Visit (HOSPITAL_COMMUNITY)
Admission: RE | Admit: 2023-01-09 | Discharge: 2023-01-09 | Disposition: A | Discharge status: Home / Self Care | Payer: Medicare HMO | Attending: Cardiology | Admitting: Cardiology

## 2023-01-09 DIAGNOSIS — Z8249 Family history of ischemic heart disease and other diseases of the circulatory system: Secondary | ICD-10-CM | POA: Insufficient documentation

## 2023-01-09 DIAGNOSIS — I10 Essential (primary) hypertension: Secondary | ICD-10-CM | POA: Insufficient documentation

## 2023-01-09 DIAGNOSIS — Z87891 Personal history of nicotine dependence: Secondary | ICD-10-CM | POA: Diagnosis not present

## 2023-01-09 DIAGNOSIS — I480 Paroxysmal atrial fibrillation: Secondary | ICD-10-CM

## 2023-01-09 DIAGNOSIS — E119 Type 2 diabetes mellitus without complications: Secondary | ICD-10-CM | POA: Insufficient documentation

## 2023-01-09 DIAGNOSIS — E782 Mixed hyperlipidemia: Secondary | ICD-10-CM | POA: Diagnosis not present

## 2023-01-09 DIAGNOSIS — I4819 Other persistent atrial fibrillation: Secondary | ICD-10-CM | POA: Diagnosis not present

## 2023-01-09 HISTORY — PX: ATRIAL FIBRILLATION ABLATION: EP1191

## 2023-01-09 LAB — GLUCOSE, CAPILLARY
Glucose-Capillary: 83 mg/dL (ref 70–99)
Glucose-Capillary: 86 mg/dL (ref 70–99)

## 2023-01-09 LAB — POCT ACTIVATED CLOTTING TIME: Activated Clotting Time: 391 seconds

## 2023-01-09 SURGERY — ATRIAL FIBRILLATION ABLATION
Anesthesia: General

## 2023-01-09 MED ORDER — DEXAMETHASONE SODIUM PHOSPHATE 10 MG/ML IJ SOLN
INTRAMUSCULAR | Status: DC | PRN
  Administered 2023-01-09: 5 mg via INTRAVENOUS

## 2023-01-09 MED ORDER — HEPARIN (PORCINE) IN NACL 1000-0.9 UT/500ML-% IV SOLN
INTRAVENOUS | Status: DC | PRN
  Administered 2023-01-09 (×4): 500 mL

## 2023-01-09 MED ORDER — ONDANSETRON HCL 4 MG/2ML IJ SOLN: 4.0000 mg | Freq: Four times a day (QID) | INTRAMUSCULAR | Status: DC | PRN

## 2023-01-09 MED ORDER — SODIUM CHLORIDE 0.9 % IV SOLN: 250.0000 mL | INTRAVENOUS | Status: DC | PRN

## 2023-01-09 MED ORDER — SODIUM CHLORIDE 0.9% FLUSH: 3.0000 mL | INTRAVENOUS | Status: DC | PRN

## 2023-01-09 MED ORDER — ATROPINE SULFATE 0.4 MG/ML IV SOLN
INTRAVENOUS | Status: DC | PRN
  Administered 2023-01-09: 1 mg via INTRAVENOUS

## 2023-01-09 MED ORDER — FENTANYL CITRATE (PF) 100 MCG/2ML IJ SOLN
INTRAMUSCULAR | Status: AC
  Filled 2023-01-09: qty 2

## 2023-01-09 MED ORDER — SUGAMMADEX SODIUM 200 MG/2ML IV SOLN
INTRAVENOUS | Status: DC | PRN
  Administered 2023-01-09: 200 mg via INTRAVENOUS

## 2023-01-09 MED ORDER — PROTAMINE SULFATE 10 MG/ML IV SOLN
INTRAVENOUS | Status: DC | PRN
  Administered 2023-01-09: 40 mg via INTRAVENOUS

## 2023-01-09 MED ORDER — GLYCOPYRROLATE PF 0.2 MG/ML IJ SOSY
PREFILLED_SYRINGE | INTRAMUSCULAR | Status: DC | PRN
Start: 2023-01-09 — End: 2023-01-09
  Administered 2023-01-09: .2 mg via INTRAVENOUS

## 2023-01-09 MED ORDER — HEPARIN SODIUM (PORCINE) 1000 UNIT/ML IJ SOLN
INTRAMUSCULAR | Status: DC | PRN
  Administered 2023-01-09: 14000 [IU] via INTRAVENOUS

## 2023-01-09 MED ORDER — SODIUM CHLORIDE 0.9 % IV SOLN: INTRAVENOUS | Status: DC

## 2023-01-09 MED ORDER — ACETAMINOPHEN 500 MG PO TABS
1000.0000 mg | ORAL_TABLET | Freq: Once | ORAL | Status: AC
  Administered 2023-01-09: 1000 mg via ORAL
  Filled 2023-01-09: qty 2

## 2023-01-09 MED ORDER — PROPOFOL 10 MG/ML IV BOLUS
INTRAVENOUS | Status: DC | PRN
Start: 2023-01-09 — End: 2023-01-09
  Administered 2023-01-09: 150 mg via INTRAVENOUS

## 2023-01-09 MED ORDER — LIDOCAINE 2% (20 MG/ML) 5 ML SYRINGE
INTRAMUSCULAR | Status: DC | PRN
  Administered 2023-01-09: 40 mg via INTRAVENOUS

## 2023-01-09 MED ORDER — ACETAMINOPHEN 325 MG PO TABS: 650.0000 mg | ORAL_TABLET | ORAL | Status: DC | PRN

## 2023-01-09 MED ORDER — PHENYLEPHRINE 80 MCG/ML (10ML) SYRINGE FOR IV PUSH (FOR BLOOD PRESSURE SUPPORT)
PREFILLED_SYRINGE | INTRAVENOUS | Status: DC | PRN
  Administered 2023-01-09 (×2): 160 ug via INTRAVENOUS

## 2023-01-09 MED ORDER — ONDANSETRON HCL 4 MG/2ML IJ SOLN
INTRAMUSCULAR | Status: DC | PRN
  Administered 2023-01-09: 4 mg via INTRAVENOUS

## 2023-01-09 MED ORDER — ROCURONIUM BROMIDE 10 MG/ML (PF) SYRINGE
PREFILLED_SYRINGE | INTRAVENOUS | Status: DC | PRN
  Administered 2023-01-09: 50 mg via INTRAVENOUS
  Administered 2023-01-09: 10 mg via INTRAVENOUS

## 2023-01-09 MED ORDER — HEPARIN SODIUM (PORCINE) 1000 UNIT/ML IJ SOLN: INTRAMUSCULAR | Status: DC | PRN

## 2023-01-09 MED ORDER — MIDAZOLAM HCL 2 MG/2ML IJ SOLN
INTRAMUSCULAR | Status: DC | PRN
  Administered 2023-01-09: 2 mg via INTRAVENOUS

## 2023-01-09 MED ORDER — FENTANYL CITRATE (PF) 250 MCG/5ML IJ SOLN
INTRAMUSCULAR | Status: DC | PRN
  Administered 2023-01-09: 100 ug via INTRAVENOUS

## 2023-01-09 MED ORDER — MIDAZOLAM HCL 2 MG/2ML IJ SOLN
INTRAMUSCULAR | Status: AC
  Filled 2023-01-09: qty 2

## 2023-01-09 MED ORDER — PHENYLEPHRINE HCL-NACL 20-0.9 MG/250ML-% IV SOLN
INTRAVENOUS | Status: DC | PRN
  Administered 2023-01-09: 50 ug/min via INTRAVENOUS

## 2023-01-09 SURGICAL SUPPLY — 22 items
CABLE PFA RX CATH CONN (CABLE) IMPLANT
CATH 8FR REPROCESSED SOUNDSTAR (CATHETERS) ×1 IMPLANT
CATH 8FR SOUNDSTAR REPROCESSED (CATHETERS) IMPLANT
CATH FARAWAVE ABLATION 31 (CATHETERS) IMPLANT
CATH OCTARAY 2.0 F 3-3-3-3-3 (CATHETERS) IMPLANT
CATH WEBSTER BI DIR CS D-F CRV (CATHETERS) IMPLANT
CLOSURE MYNX CONTROL 6F/7F (Vascular Products) IMPLANT
CLOSURE PERCLOSE PROSTYLE (VASCULAR PRODUCTS) IMPLANT
COVER SWIFTLINK CONNECTOR (BAG) ×1 IMPLANT
DILATOR VESSEL 38 20CM 16FR (INTRODUCER) IMPLANT
GUIDEWIRE INQWIRE 1.5J.035X260 (WIRE) IMPLANT
INQWIRE 1.5J .035X260CM (WIRE) ×2
PACK EP LATEX FREE (CUSTOM PROCEDURE TRAY) ×1
PACK EP LF (CUSTOM PROCEDURE TRAY) ×1 IMPLANT
PAD DEFIB RADIO PHYSIO CONN (PAD) ×1 IMPLANT
PATCH CARTO3 (PAD) IMPLANT
SHEATH FARADRIVE STEERABLE (SHEATH) IMPLANT
SHEATH PINNACLE 7F 10CM (SHEATH) IMPLANT
SHEATH PINNACLE 8F 10CM (SHEATH) IMPLANT
SHEATH PINNACLE 9F 10CM (SHEATH) IMPLANT
SHEATH PROBE COVER 6X72 (BAG) IMPLANT
SHEATH WIRE KIT BAYLIS SL1 (KITS) IMPLANT

## 2023-01-09 NOTE — Anesthesia Postprocedure Evaluation (Signed)
Anesthesia Post Note  Patient: Brittany [name]  Procedure(s) Performed: ATRIAL FIBRILLATION ABLATION     Patient location during evaluation: PACU Anesthesia Type: General Level of consciousness: awake and alert Pain management: pain level controlled Vital Signs Assessment: post-procedure vital signs reviewed and stable Respiratory status: spontaneous breathing, nonlabored ventilation and respiratory function stable Cardiovascular status: blood pressure returned to baseline Postop Assessment: no apparent nausea or vomiting Anesthetic complications: no   There were no known notable events for this encounter.  Last Vitals:  Vitals:   01/09/23 1000 01/09/23 1004  BP: 135/60   Pulse: (!) 50   Resp: 14   Temp:  36.8 C  SpO2: 98%     Last Pain:  Vitals:   01/09/23 1004  TempSrc: Temporal  PainSc:                  Shanda Howells

## 2023-01-09 NOTE — Transfer of Care (Addendum)
**Note Brittany-Identified via Obfuscation** Immediate Anesthesia Transfer of Care Note  Patient: Brittany [name]  Procedure(s) Performed: ATRIAL FIBRILLATION ABLATION  Patient Location: PACU  Anesthesia Type:General  Level of Consciousness: awake and alert   Airway & Oxygen Therapy: Patient Spontanous Breathing and Patient connected to nasal cannula oxygen  Post-op Assessment: Report given to RN, Post -op Vital signs reviewed and stable, and Patient moving all extremities X 4  Post vital signs: Reviewed  BP 135/60   Pulse (!) 50   Temp 36.8 C (Temporal)   Resp 14   Ht 5' 5.75" (1.67 m)   Wt 81.6 kg   LMP 09/23/2012   SpO2 98%   BMI 29.27 kg/m       Complications: There were no known notable events for this encounter.

## 2023-01-09 NOTE — H&P (Signed)
Electrophysiology Office Note   Date:  01/09/2023   ID:  Shamia Gulling [facility], DOB 11/06/1965, MRN [id number]  PCP:  Blane Ohara, MD  Cardiologist:   Primary Electrophysiologist:  Aran Menning Jorja Loa, MD    Chief Complaint: bradycardia   History of Present Illness: Taijah Cowins is a 57 y.o. female who is being seen today for the evaluation of bradycardia at the request of No ref. provider found. Presenting today for electrophysiology evaluation.  She has a history seen for diabetes, hypertension, hyperlipidemia, Crohn's disease.  She presented to cardiology clinic post-COVID with palpitations and bradycardia.  She was found to have heart rates in the 40s with fatigue.  She then presented to the emergency room with atrial fibrillation.  She is post cardioversion 07/03/2022.  She again presented to the emergency room 09/04/2022 and atrial fibrillation.  She was having shortness of breath and fatigue.  She is post cardioversion.  Today, denies symptoms of palpitations, chest pain, shortness of breath, orthopnea, PND, lower extremity edema, claudication, dizziness, presyncope, syncope, bleeding, or neurologic sequela. The patient is tolerating medications without difficulties. Plan ablation today.    Past Medical History:  Diagnosis Date   BMI 26.0-26.9,adult 07/06/2019   Bradycardia 08/10/2014   Crohn's disease (HCC)    DDD (degenerative disc disease), lumbar 07/17/2017   Diabetes mellitus without complication (HCC)    Fatigue 08/10/2014   Hyperlipidemia    Hyperlipidemia associated with type 2 diabetes mellitus (HCC) 07/06/2019   Hypertension    Hypertension, essential    Mixed hyperlipidemia    Palpitations 07/06/2019   Paresthesia 07/06/2019   Past Surgical History:  Procedure Laterality Date   ABDOMINAL HYSTERECTOMY N/A 10/20/2012   Procedure: HYSTERECTOMY ABDOMINAL;  Surgeon: Miguel Aschoff, MD;  Location: [facility];  Service: Gynecology;  Laterality: N/A;   ANAL FISSURE REPAIR   05/13/2000   APPENDECTOMY     CARDIOVERSION N/A 07/03/2021   Procedure: CARDIOVERSION;  Surgeon: Little Ishikawa, MD;  Location: [facility] ENDOSCOPY;  Service: Cardiovascular;  Laterality: N/A;   CATARACT EXTRACTION Bilateral    EYE SURGERY  05/31/2022   removed film   SALPINGOOPHORECTOMY Bilateral 10/20/2012   Procedure: SALPINGO OOPHORECTOMY;  Surgeon: Miguel Aschoff, MD;  Location: [facility];  Service: Gynecology;  Laterality: Bilateral;   SMALL INTESTINE SURGERY     reconstruction   TONSILLECTOMY       Current Facility-Administered Medications  Medication Dose Route Frequency Provider Last Rate Last Admin   0.9 %  sodium chloride infusion   Intravenous Continuous Nini Cavan, Andree Coss, MD 50 mL/hr at 01/09/23 0620 New Bag at 01/09/23 0620   fentaNYL (SUBLIMAZE) 100 MCG/2ML injection            midazolam (VERSED) 2 MG/2ML injection             Allergies:   Mercaptopurine, Amitriptyline, Crestor [rosuvastatin calcium], Metoprolol, Trazodone and nefazodone, Zocor [simvastatin], Morphine and codeine, and Penicillins   Social History:  The patient  reports that she quit smoking about 22 years ago. Her smoking use included cigarettes. She has never used smokeless tobacco. She reports current alcohol use of about 1.0 standard drink of alcohol per week. She reports that she does not use drugs.   Family History:  The patient's family history includes Cancer in her maternal grandmother; Diabetes in her father and mother; Heart Problems in her father and mother; Peptic Ulcer Disease in an other family member.   ROS:  Please see the history of present illness.   Otherwise,  review of systems is positive for none.   All other systems are reviewed and negative.   PHYSICAL EXAM: VS:  BP (!) 146/61   Pulse (!) 56   Temp 98.3 F (36.8 C) (Temporal)   Resp 18   Ht 5' 5.75" (1.67 m)   Wt 81.6 kg   LMP 09/23/2012   SpO2 97%   BMI 29.27 kg/m  , BMI Body mass index is 29.27 kg/m. GEN: Well  nourished, well developed, in no acute distress  HEENT: normal  Neck: no JVD, carotid bruits, or masses Cardiac: RRR; no murmurs, rubs, or gallops,no edema  Respiratory:  clear to auscultation bilaterally, normal work of breathing GI: soft, nontender, nondistended, + BS MS: no deformity or atrophy  Skin: warm and dry Neuro:  Strength and sensation are intact Psych: euthymic mood, full affect  Recent Labs: 01/30/2022: Magnesium 2.0 06/07/2022: ALT 14 09/18/2022: B Natriuretic Peptide 315.9 10/10/2022: TSH 2.500 12/23/2022: BUN 14; Creatinine, Ser 0.68; Hemoglobin 14.1; Platelets 214; Potassium 4.9; Sodium 141    Lipid Panel     Component Value Date/Time   CHOL 211 (H) 01/02/2023 0751   TRIG 104 01/02/2023 0751   HDL 81 01/02/2023 0751   CHOLHDL 2.6 01/02/2023 0751   LDLCALC 112 (H) 01/02/2023 0751     Wt Readings from Last 3 Encounters:  01/09/23 81.6 kg  11/01/22 81.2 kg  10/11/22 81.2 kg      Other studies Reviewed: Additional studies/ records that were reviewed today include: TTE 05/01/21 Review of the above records today demonstrates:   1. Left ventricular ejection fraction, by estimation, is 60 to 65%. The  left ventricle has normal function. The left ventricle has no regional  wall motion abnormalities. There is mild left ventricular hypertrophy of  the inferior segment. Left  ventricular diastolic parameters are indeterminate.   2. Right ventricular systolic function is normal. The right ventricular  size is normal.   3. Left atrial size was mildly dilated.   4. The mitral valve is normal in structure. Mild mitral valve  regurgitation. No evidence of mitral stenosis.   5. The aortic valve is normal in structure. Aortic valve regurgitation is  not visualized. No aortic stenosis is present.   6. The inferior vena cava is normal in size with greater than 50%  respiratory variability, suggesting right atrial pressure of 3 mmHg.    Myoview 04/18/2021 Low risk stress  nuclear study with normal perfusion and normal left ventricular regional and global systolic function.  ASSESSMENT AND PLAN:  1.  Paroxysmal atrial fibrillation: Nancy Fetter has presented today for surgery, with the diagnosis of AF.  The various methods of treatment have been discussed with the patient and family. After consideration of risks, benefits and other options for treatment, the patient has consented to  Procedure(s): Catheter ablation as a surgical intervention .  Risks include but not limited to complete heart block, stroke, esophageal damage, nerve damage, bleeding, vascular damage, tamponade, perforation, MI, and death. The patient's history has been reviewed, patient examined, no change in status, stable for surgery.  I have reviewed the patient's chart and labs.  Questions were answered to the patient's satisfaction.    Deloise Marchant Elberta Fortis, MD 01/09/2023 7:10 AM

## 2023-01-09 NOTE — Anesthesia Procedure Notes (Addendum)
Procedure Name: Intubation Date/Time: 01/09/2023 7:46 AM  Performed by: Kaylyn Layer, MDPre-anesthesia Checklist: Patient identified, Emergency Drugs available, Suction available and Patient being monitored Patient Re-evaluated:Patient Re-evaluated prior to induction Oxygen Delivery Method: Circle System Utilized Preoxygenation: Pre-oxygenation with 100% oxygen Induction Type: IV induction Ventilation: Mask ventilation without difficulty Laryngoscope Size: Miller and 2 Grade View: Grade I Tube type: Oral Tube size: 6.5 mm Number of attempts: 1 Airway Equipment and Method: Stylet Placement Confirmation: ETT inserted through vocal cords under direct vision, positive ETCO2 and breath sounds checked- equal and bilateral Secured at: 21 cm Tube secured with: Tape Dental Injury: Teeth and Oropharynx as per pre-operative assessment

## 2023-01-09 NOTE — Discharge Instructions (Signed)

## 2023-01-10 ENCOUNTER — Encounter (HOSPITAL_COMMUNITY): Payer: Self-pay | Admitting: Cardiology

## 2023-01-22 DIAGNOSIS — K50919 Crohn's disease, unspecified, with unspecified complications: Secondary | ICD-10-CM | POA: Diagnosis not present

## 2023-01-23 ENCOUNTER — Ambulatory Visit (HOSPITAL_COMMUNITY)
Admission: RE | Admit: 2023-01-23 | Discharge: 2023-01-23 | Disposition: A | Discharge status: Home / Self Care | Payer: Medicare HMO | Source: Ambulatory Visit | Attending: Internal Medicine | Admitting: Internal Medicine

## 2023-01-23 VITALS — BP 162/74 | HR 51 | Ht 65.75 in | Wt 181.6 lb

## 2023-01-23 DIAGNOSIS — Z7901 Long term (current) use of anticoagulants: Secondary | ICD-10-CM | POA: Insufficient documentation

## 2023-01-23 DIAGNOSIS — R001 Bradycardia, unspecified: Secondary | ICD-10-CM | POA: Diagnosis not present

## 2023-01-23 DIAGNOSIS — I4819 Other persistent atrial fibrillation: Secondary | ICD-10-CM

## 2023-01-23 DIAGNOSIS — I4891 Unspecified atrial fibrillation: Secondary | ICD-10-CM

## 2023-01-23 DIAGNOSIS — F439 Reaction to severe stress, unspecified: Secondary | ICD-10-CM | POA: Diagnosis present

## 2023-01-23 DIAGNOSIS — I10 Essential (primary) hypertension: Secondary | ICD-10-CM | POA: Diagnosis not present

## 2023-01-23 NOTE — Progress Notes (Signed)
Primary Care Physician: Blane Ohara, MD Primary Cardiologist: Dr Mayford Knife (remotely) Primary Electrophysiologist: Dr Elberta Fortis Referring Physician: [facility]   Brittany [name] is a 57 y.o. female with a history of DM, HTN, HLD, Crohn's disease, atrial fibrillation who presents for follow up in the [facility].  She did present to the emergency room 04/01/2021 with chest pain and palpitations.  She was found to be in atrial fibrillation at that time.  She felt that she went into atrial fibrillation 11/18, after her mother passed away. She underwent DCCV on 07/03/21. Patient is on Xarelto for a CHADS2VASC score of 2. She presented to the ED 01/30/22 with palpitations. Her smart watch alerted her that she was in afib. She underwent repeat DCCV at that time.  On follow up today, patient remains in SR. She admits she was under a lot of stress when she went into afib again. There were no other triggers that she could identify. She does admit to having very brief palpitations infrequently.   On follow up 01/23/23, she is currently in NSR. She is s/p Afib ablation on 01/09/23 by Dr. Elberta Fortis. No episodes of Afib since ablation. She wears an Scientist, physiological for monitoring rhythm at home. She has some fatigue and is a little SOB with walking. She has noted every now and then the feeling of swallowing being funny but no actual difficulty swallowing foods; it is improving. No chest pain. Leg sites healed without issue. No missed doses of Xarelto.   Today, she denies symptoms of orthopnea, PND, lower extremity edema, dizziness, presyncope, syncope, snoring, daytime somnolence, bleeding, or neurologic sequela. The patient is tolerating medications without difficulties and is otherwise without complaint today.    Atrial Fibrillation Risk Factors:  she does not have symptoms or diagnosis of sleep apnea. she does not have a history of rheumatic fever. she does not have a history of  alcohol use. The patient does have a history of early familial atrial fibrillation or other arrhythmias. Mother and father had afib.   she has a BMI of Body mass index is 29.53 kg/m.Marland Kitchen Filed Weights   01/23/23 1404  Weight: 82.4 kg     Family History  Problem Relation Age of Onset   Heart Problems Mother    Diabetes Mother    Diabetes Father    Heart Problems Father    Cancer Maternal Grandmother        lung   Peptic Ulcer Disease Other      Atrial Fibrillation Management history:  Previous antiarrhythmic drugs: none Previous cardioversions: 07/03/21, 01/30/22 Previous ablations: 01/09/23 CHADS2VASC score: 2 Anticoagulation history: Xarelto    Past Medical History:  Diagnosis Date   BMI 26.0-26.9,adult 07/06/2019   Bradycardia 08/10/2014   Crohn's disease (HCC)    DDD (degenerative disc disease), lumbar 07/17/2017   Diabetes mellitus without complication (HCC)    Fatigue 08/10/2014   Hyperlipidemia    Hyperlipidemia associated with type 2 diabetes mellitus (HCC) 07/06/2019   Hypertension    Hypertension, essential    Mixed hyperlipidemia    Palpitations 07/06/2019   Paresthesia 07/06/2019   Past Surgical History:  Procedure Laterality Date   ABDOMINAL HYSTERECTOMY N/A 10/20/2012   Procedure: HYSTERECTOMY ABDOMINAL;  Surgeon: Miguel Aschoff, MD;  Location: [facility];  Service: Gynecology;  Laterality: N/A;   ANAL FISSURE REPAIR  05/13/2000   APPENDECTOMY     ATRIAL FIBRILLATION ABLATION N/A 01/09/2023   Procedure: ATRIAL FIBRILLATION ABLATION;  Surgeon: Elberta Fortis, Will  Daphine Deutscher, MD;  Location: [facility] INVASIVE CV LAB;  Service: Cardiovascular;  Laterality: N/A;   CARDIOVERSION N/A 07/03/2021   Procedure: CARDIOVERSION;  Surgeon: Little Ishikawa, MD;  Location: [facility] ENDOSCOPY;  Service: Cardiovascular;  Laterality: N/A;   CATARACT EXTRACTION Bilateral    EYE SURGERY  05/31/2022   removed film   SALPINGOOPHORECTOMY Bilateral 10/20/2012   Procedure: SALPINGO OOPHORECTOMY;  Surgeon:  Miguel Aschoff, MD;  Location: [facility];  Service: Gynecology;  Laterality: Bilateral;   SMALL INTESTINE SURGERY     reconstruction   TONSILLECTOMY      Current Outpatient Medications  Medication Sig Dispense Refill   ALPRAZolam (XANAX) 0.5 MG tablet Take 1 tablet (0.5 mg total) by mouth at bedtime as needed for anxiety. (Patient taking differently: Take 0.5 mg by mouth at bedtime.) 30 tablet 5   Calcium Carbonate-Vitamin D 600-10 MG-MCG TABS Take 600 mg by mouth daily.     estradiol (ESTRACE) 1 MG tablet TAKE 1 TABLET EVERY DAY 90 tablet 3   ezetimibe (ZETIA) 10 MG tablet TAKE 1 TABLET EVERY DAY 90 tablet 1   finasteride (PROSCAR) 5 MG tablet TAKE 1 TABLET EVERY DAY 90 tablet 1   fish oil-omega-3 fatty acids 1000 MG capsule Take 1 g by mouth daily.     gabapentin (NEURONTIN) 300 MG capsule TAKE 2 CAPSULES AT BEDTIME 180 capsule 3   Glucosamine-Chondroit-Vit C-Mn (GLUCOSAMINE 1500 COMPLEX PO) Take 2 tablets by mouth at bedtime.     rivaroxaban (XARELTO) 20 MG TABS tablet TAKE 1 TABLET BY MOUTH ONCE DAILY WITH SUPPER 90 tablet 1   ustekinumab (STELARA) 45 MG/0.5ML injection Inject 45 mg into the skin every 8 (eight) weeks.     No current facility-administered medications for this encounter.    Allergies  Allergen Reactions   Mercaptopurine     Used to treat pt. Crohn's Disease.  Caused Pancreatis 1992. Other reaction(s): Other (See Comments) Caused Pancreatis 1992   Amitriptyline Other (See Comments)    No emotions  Felt poorly while taking   Crestor [Rosuvastatin Calcium]     Muscle pain   Metoprolol Other (See Comments)    Extreme fatigue    Trazodone And Nefazodone     Did not work for pt    Zocor [Simvastatin]     Cramps in legs   Morphine And Codeine Itching   Penicillins Rash    ROS- All systems are reviewed and negative except as per the HPI above.  Physical Exam: Vitals:   01/23/23 1404  BP: (!) 162/74  Pulse: (!) 51  Weight: 82.4 kg  Height: 5' 5.75" (1.67 m)     GEN- The patient is well appearing, alert and oriented x 3 today.   Neck - no JVD or carotid bruit noted Lungs- Clear to ausculation bilaterally, normal work of breathing Heart- Regular rate and rhythm, no murmurs, rubs or gallops, PMI not laterally displaced Extremities- no clubbing, cyanosis, or edema Skin - no rash or ecchymosis noted   Wt Readings from Last 3 Encounters:  01/23/23 82.4 kg  01/09/23 81.6 kg  11/01/22 81.2 kg    EKG today demonstrates  Vent. rate 51 BPM PR interval 176 ms QRS duration 80 ms QT/QTcB 426/392 ms P-R-T axes 6 10 26  Sinus bradycardia Otherwise normal ECG When compared with ECG of 09-Jan-2023 09:34, PREVIOUS ECG IS PRESENT  Echo 05/01/21 demonstrated   1. Left ventricular ejection fraction, by estimation, is 60 to 65%. The  left ventricle has normal function. The left ventricle  has no regional  wall motion abnormalities. There is mild left ventricular hypertrophy of  the inferior segment. Left ventricular diastolic parameters are indeterminate.   2. Right ventricular systolic function is normal. The right ventricular  size is normal.   3. Left atrial size was mildly dilated.   4. The mitral valve is normal in structure. Mild mitral valve  regurgitation. No evidence of mitral stenosis.   5. The aortic valve is normal in structure. Aortic valve regurgitation is  not visualized. No aortic stenosis is present.   6. The inferior vena cava is normal in size with greater than 50%  respiratory variability, suggesting right atrial pressure of 3 mmHg.   Epic records are reviewed at length today  CHA2DS2-VASc Score = 3  The patient's score is based upon: CHF History: 0 HTN History: 1 Diabetes History: 1 Stroke History: 0 Vascular Disease History: 0 Age Score: 0 Gender Score: 1       ASSESSMENT AND PLAN: 1. Persistent Atrial Fibrillation (ICD10:  I48.19) The patient's CHA2DS2-VASc score is 3, indicating a 3.2% annual risk of stroke.    S/p DCCV on 01/30/22. S/p Afib ablation on 01/09/23 by Dr. Elberta Fortis.  She is currently in NSR.   Continue Xarelto 20 mg daily  2. HTN Recommended to patient to trend at home.    Follow up with Dr Elberta Fortis as scheduled.    Lake Bells, PA-C Afib Clinic [facility] [address] [place], [place] [address] [phone] 01/23/2023 2:38 PM

## 2023-02-03 ENCOUNTER — Other Ambulatory Visit: Payer: Self-pay | Admitting: Family Medicine

## 2023-02-17 ENCOUNTER — Ambulatory Visit: Payer: Medicare HMO | Admitting: Family Medicine

## 2023-02-17 VITALS — BP 140/72 | HR 56 | Temp 95.9°F | Resp 16 | Ht 65.0 in | Wt 181.4 lb

## 2023-02-17 DIAGNOSIS — R600 Localized edema: Secondary | ICD-10-CM | POA: Diagnosis not present

## 2023-02-17 DIAGNOSIS — Z23 Encounter for immunization: Secondary | ICD-10-CM | POA: Diagnosis not present

## 2023-02-17 DIAGNOSIS — R197 Diarrhea, unspecified: Secondary | ICD-10-CM | POA: Diagnosis not present

## 2023-02-17 MED ORDER — HYDROCHLOROTHIAZIDE 25 MG PO TABS: 25.0000 mg | ORAL_TABLET | Freq: Every day | ORAL | 0 refills | Status: DC

## 2023-02-17 MED ORDER — CHOLESTYRAMINE LIGHT 4 G PO PACK
4.0000 g | PACK | Freq: Three times a day (TID) | ORAL | 2 refills | Status: DC
Start: 2023-02-17 — End: 2023-04-17

## 2023-02-17 MED ORDER — FLUCONAZOLE 150 MG PO TABS: 150.0000 mg | ORAL_TABLET | Freq: Once | ORAL | 0 refills | Status: AC

## 2023-02-17 NOTE — Progress Notes (Unsigned)
Acute Office Visit  Subjective:    Patient ID: Brittany [name], female    DOB: Jan 29, 1966, 57 y.o.   MRN: [id number]  Chief Complaint  Patient presents with   Bleeding/Bruising    HPI: Patient is in today for bruising.  She currently is taking xarelto 20 mg daily as prescribed.   Patient is concerned about wt gain and is swelling for a while. Had ablation surgery which went well. Swelling resolved for one week after ablation and then came back. Patient had a few episodes of palpitations with the first 2 weeks after the ablation.    Past Medical History:  Diagnosis Date   BMI 26.0-26.9,adult 07/06/2019   Bradycardia 08/10/2014   Crohn's disease (HCC)    DDD (degenerative disc disease), lumbar 07/17/2017   Diabetes mellitus without complication (HCC)    Fatigue 08/10/2014   Hyperlipidemia    Hyperlipidemia associated with type 2 diabetes mellitus (HCC) 07/06/2019   Hypertension    Hypertension, essential    Mixed hyperlipidemia    Palpitations 07/06/2019   Paresthesia 07/06/2019    Past Surgical History:  Procedure Laterality Date   ABDOMINAL HYSTERECTOMY N/A 10/20/2012   Procedure: HYSTERECTOMY ABDOMINAL;  Surgeon: Miguel Aschoff, MD;  Location: [facility];  Service: Gynecology;  Laterality: N/A;   ANAL FISSURE REPAIR  05/13/2000   APPENDECTOMY     ATRIAL FIBRILLATION ABLATION N/A 01/09/2023   Procedure: ATRIAL FIBRILLATION ABLATION;  Surgeon: Regan Lemming, MD;  Location: [facility];  Service: Cardiovascular;  Laterality: N/A;   CARDIOVERSION N/A 07/03/2021   Procedure: CARDIOVERSION;  Surgeon: Little Ishikawa, MD;  Location: [facility] ENDOSCOPY;  Service: Cardiovascular;  Laterality: N/A;   CATARACT EXTRACTION Bilateral    EYE SURGERY  05/31/2022   removed film   SALPINGOOPHORECTOMY Bilateral 10/20/2012   Procedure: SALPINGO OOPHORECTOMY;  Surgeon: Miguel Aschoff, MD;  Location: [facility];  Service: Gynecology;  Laterality: Bilateral;   SMALL INTESTINE SURGERY      reconstruction   TONSILLECTOMY      Family History  Problem Relation Age of Onset   Heart Problems Mother    Diabetes Mother    Diabetes Father    Heart Problems Father    Cancer Maternal Grandmother        lung   Peptic Ulcer Disease Other     Social History   Socioeconomic History   Marital status: Widowed    Spouse name: Not on file   Number of children: 1   Years of education: Not on file   Highest education level: Not on file  Occupational History   Not on file  Tobacco Use   Smoking status: Former    Current packs/day: 0.00    Types: Cigarettes    Quit date: 2002    Years since quitting: 22.7   Smokeless tobacco: Never   Tobacco comments:    Former smoker 02/05/22  Vaping Use   Vaping status: Never Used  Substance and Sexual Activity   Alcohol use: Yes    Alcohol/week: 1.0 standard drink of alcohol    Types: 1 Standard drinks or equivalent per week    Comment: 1 drink per month 02/05/22   Drug use: No   Sexual activity: Not on file  Other Topics Concern   Not on file  Social History Narrative   wears sunscreen, brushes and flosses daily, see's dentist bi-annually, has smoke/carbon monoxide detectors, wears a seatbelt and practices gun safety   Social Determinants of Health  Financial Resource Strain: Low Risk  (06/12/2022)   Overall Financial Resource Strain (CARDIA)    Difficulty of Paying Living Expenses: Not very hard  Food Insecurity: No Food Insecurity (06/12/2022)   Hunger Vital Sign    Worried About Running Out of Food in the Last Year: Never true    Ran Out of Food in the Last Year: Never true  Transportation Needs: No Transportation Needs (06/12/2022)   PRAPARE - Administrator, Civil Service (Medical): No    Lack of Transportation (Non-Medical): No  Physical Activity: Insufficiently Active (06/12/2022)   Exercise Vital Sign    Days of Exercise per Week: 2 days    Minutes of Exercise per Session: 20 min  Stress: No Stress Concern  Present (06/12/2022)   [employer] of Occupational Health - Occupational Stress Questionnaire    Feeling of Stress : Only a little  Social Connections: Moderately Integrated (06/12/2022)   Social Connection and Isolation Panel [NHANES]    Frequency of Communication with Friends and Family: More than three times a week    Frequency of Social Gatherings with Friends and Family: More than three times a week    Attends Religious Services: More than 4 times per year    Active Member of [employer] or Organizations: Yes    Attends Banker Meetings: 1 to 4 times per year    Marital Status: Widowed  Intimate Partner Violence: Not At Risk (06/13/2022)   Humiliation, Afraid, Rape, and Kick questionnaire    Fear of Current or Ex-Partner: No    Emotionally Abused: No    Physically Abused: No    Sexually Abused: No    Outpatient Medications Prior to Visit  Medication Sig Dispense Refill   ALPRAZolam (XANAX) 0.5 MG tablet Take 1 tablet (0.5 mg total) by mouth at bedtime as needed for anxiety. (Patient taking differently: Take 0.5 mg by mouth at bedtime.) 30 tablet 5   Calcium Carbonate-Vitamin D 600-10 MG-MCG TABS Take 600 mg by mouth daily.     estradiol (ESTRACE) 1 MG tablet TAKE 1 TABLET EVERY DAY 90 tablet 3   ezetimibe (ZETIA) 10 MG tablet TAKE 1 TABLET EVERY DAY 90 tablet 1   finasteride (PROSCAR) 5 MG tablet TAKE 1 TABLET EVERY DAY 90 tablet 1   fish oil-omega-3 fatty acids 1000 MG capsule Take 1 g by mouth daily.     gabapentin (NEURONTIN) 300 MG capsule TAKE 2 CAPSULES AT BEDTIME 180 capsule 3   Glucosamine-Chondroit-Vit C-Mn (GLUCOSAMINE 1500 COMPLEX PO) Take 2 tablets by mouth at bedtime.     rivaroxaban (XARELTO) 20 MG TABS tablet TAKE 1 TABLET BY MOUTH ONCE DAILY WITH SUPPER 90 tablet 1   ustekinumab (STELARA) 45 MG/0.5ML injection Inject 45 mg into the skin every 8 (eight) weeks.     No facility-administered medications prior to visit.    Allergies  Allergen Reactions    Mercaptopurine     Used to treat pt. Crohn's Disease.  Caused Pancreatis 1992. Other reaction(s): Other (See Comments) Caused Pancreatis 1992   Amitriptyline Other (See Comments)    No emotions  Felt poorly while taking   Crestor [Rosuvastatin Calcium]     Muscle pain   Metoprolol Other (See Comments)    Extreme fatigue    Trazodone And Nefazodone     Did not work for pt    Zocor [Simvastatin]     Cramps in legs   Morphine And Codeine Itching   Penicillins Rash  Review of Systems  Constitutional:  Negative for chills, fatigue and fever.  HENT:  Negative for congestion, ear pain and sore throat.   Respiratory:  Negative for cough and shortness of breath.   Cardiovascular:  Negative for chest pain.  Gastrointestinal:  Positive for diarrhea (has to avoid greasy foods. worsened in the last week.). Negative for abdominal pain, constipation, nausea and vomiting.  Genitourinary:  Negative for dysuria and urgency.  Musculoskeletal:  Negative for arthralgias and myalgias.  Skin:  Negative for rash.  Neurological:  Negative for dizziness and headaches.  Psychiatric/Behavioral:  Negative for dysphoric mood. The patient is not nervous/anxious.        Objective:        02/17/2023    4:23 PM 01/23/2023    2:04 PM 01/09/2023   12:00 PM  Vitals with BMI  Height 5\' 5"  5' 5.75"   Weight 181 lbs 6 oz 181 lbs 10 oz   BMI 30.19 29.54   Systolic 140 162 161  Diastolic 72 74 70  Pulse 56 51 45    No data found.   Physical Exam Vitals reviewed.  Constitutional:      Appearance: Normal appearance. She is normal weight.  Neck:     Vascular: No carotid bruit.  Cardiovascular:     Rate and Rhythm: Normal rate and regular rhythm.     Heart sounds: Normal heart sounds.  Pulmonary:     Effort: Pulmonary effort is normal. No respiratory distress.     Breath sounds: Normal breath sounds.  Abdominal:     General: Abdomen is flat. Bowel sounds are normal.     Palpations: Abdomen is  soft.     Tenderness: There is no abdominal tenderness.  Neurological:     Mental Status: She is alert and oriented to person, place, and time.  Psychiatric:        Mood and Affect: Mood normal.        Behavior: Behavior normal.     Health Maintenance Due  Topic Date Due   DTaP/Tdap/Td (1 - Tdap) Never done   Zoster Vaccines- Shingrix (1 of 2) Never done   OPHTHALMOLOGY EXAM  02/24/2022    There are no preventive care reminders to display for this patient.   Lab Results  Component Value Date   TSH 2.500 10/10/2022   Lab Results  Component Value Date   WBC 5.1 12/23/2022   HGB 14.1 12/23/2022   HCT 43.7 12/23/2022   MCV 90 12/23/2022   PLT 214 12/23/2022   Lab Results  Component Value Date   NA 141 12/23/2022   K 4.9 12/23/2022   CO2 28 12/23/2022   GLUCOSE 93 12/23/2022   BUN 14 12/23/2022   CREATININE 0.68 12/23/2022   BILITOT 0.5 06/07/2022   ALKPHOS 58 06/07/2022   AST 16 06/07/2022   ALT 14 06/07/2022   PROT 6.8 06/07/2022   ALBUMIN 4.4 06/07/2022   CALCIUM 9.6 12/23/2022   ANIONGAP 10 11/01/2022   EGFR 100 12/23/2022   Lab Results  Component Value Date   CHOL 211 (H) 01/02/2023   Lab Results  Component Value Date   HDL 81 01/02/2023   Lab Results  Component Value Date   LDLCALC 112 (H) 01/02/2023   Lab Results  Component Value Date   TRIG 104 01/02/2023   Lab Results  Component Value Date   CHOLHDL 2.6 01/02/2023   Lab Results  Component Value Date   HGBA1C 5.6 10/10/2022  Assessment & Plan:  Pedal edema Assessment & Plan: Sent hydrochlorothiazide.  Orders: -     hydroCHLOROthiazide; Take 1 tablet (25 mg total) by mouth daily.  Dispense: 90 tablet; Refill: 0  Diarrhea, unspecified type Assessment & Plan: Sent Prevalite.  Orders: -     Cholestyramine Light; Take 1 packet (4 g total) by mouth 3 (three) times daily.  Dispense: 90 packet; Refill: 2  Encounter for immunization -     Influenza, MDCK, trivalent,  PF(Flucelvax egg-free)  Other orders -     Fluconazole; Take 1 tablet (150 mg total) by mouth once for 1 dose.  Dispense: 1 tablet; Refill: 0     Meds ordered this encounter  Medications   cholestyramine light (PREVALITE) 4 g packet    Sig: Take 1 packet (4 g total) by mouth 3 (three) times daily.    Dispense:  90 packet    Refill:  2   hydrochlorothiazide (HYDRODIURIL) 25 MG tablet    Sig: Take 1 tablet (25 mg total) by mouth daily.    Dispense:  90 tablet    Refill:  0   fluconazole (DIFLUCAN) 150 MG tablet    Sig: Take 1 tablet (150 mg total) by mouth once for 1 dose.    Dispense:  1 tablet    Refill:  0    Orders Placed This Encounter  Procedures   Influenza, MDCK, trivalent, PF(Flucelvax egg-free)     Follow-up: No follow-ups on file.  An After Visit Summary was printed and given to the patient.   Clayborn Bigness I Leal-Borjas,acting as a scribe for Blane Ohara, MD.,have documented all relevant documentation on the behalf of Blane Ohara, MD,as directed by  Blane Ohara, MD while in the presence of Blane Ohara, MD.   Blane Ohara, MD [facility] [phone]

## 2023-02-19 ENCOUNTER — Telehealth: Payer: Self-pay | Admitting: Cardiology

## 2023-02-19 NOTE — Telephone Encounter (Signed)
Pt c/o medication issue:  1. Name of Medication: Hydrochlorothiazide  2. How are you currently taking this medication (dosage and times per day)?   3. Are you having a reaction (difficulty breathing--STAT)?   4. What is your medication issue? Patient said she had an Ablation on 01-21-23. Her primary doctor wants her to take this medicine. She wants to know if Dr Elberta Fortis thinks this will be alright?

## 2023-02-19 NOTE — Telephone Encounter (Signed)
Pt informed that ok to take the Hydrochlorothiazide. Patient verbalized understanding and agreeable to plan.

## 2023-02-22 NOTE — Assessment & Plan Note (Signed)
Sent Prevalite.

## 2023-02-22 NOTE — Assessment & Plan Note (Signed)
Sent hydrochlorothiazide.

## 2023-02-23 ENCOUNTER — Encounter: Payer: Self-pay | Admitting: Family Medicine

## 2023-03-11 DIAGNOSIS — H52221 Regular astigmatism, right eye: Secondary | ICD-10-CM | POA: Diagnosis not present

## 2023-03-11 DIAGNOSIS — Z961 Presence of intraocular lens: Secondary | ICD-10-CM | POA: Diagnosis not present

## 2023-03-11 DIAGNOSIS — D3131 Benign neoplasm of right choroid: Secondary | ICD-10-CM | POA: Diagnosis not present

## 2023-03-11 DIAGNOSIS — H26491 Other secondary cataract, right eye: Secondary | ICD-10-CM | POA: Diagnosis not present

## 2023-03-11 DIAGNOSIS — H04123 Dry eye syndrome of bilateral lacrimal glands: Secondary | ICD-10-CM | POA: Diagnosis not present

## 2023-03-11 DIAGNOSIS — H524 Presbyopia: Secondary | ICD-10-CM | POA: Diagnosis not present

## 2023-03-11 DIAGNOSIS — H5203 Hypermetropia, bilateral: Secondary | ICD-10-CM | POA: Diagnosis not present

## 2023-03-11 DIAGNOSIS — H26492 Other secondary cataract, left eye: Secondary | ICD-10-CM | POA: Diagnosis not present

## 2023-03-11 DIAGNOSIS — H26493 Other secondary cataract, bilateral: Secondary | ICD-10-CM | POA: Diagnosis not present

## 2023-03-11 DIAGNOSIS — H52223 Regular astigmatism, bilateral: Secondary | ICD-10-CM | POA: Diagnosis not present

## 2023-03-13 ENCOUNTER — Other Ambulatory Visit: Payer: Self-pay

## 2023-03-13 DIAGNOSIS — L821 Other seborrheic keratosis: Secondary | ICD-10-CM | POA: Diagnosis not present

## 2023-03-13 DIAGNOSIS — L820 Inflamed seborrheic keratosis: Secondary | ICD-10-CM | POA: Diagnosis not present

## 2023-03-13 DIAGNOSIS — K8020 Calculus of gallbladder without cholecystitis without obstruction: Secondary | ICD-10-CM | POA: Insufficient documentation

## 2023-03-13 DIAGNOSIS — L578 Other skin changes due to chronic exposure to nonionizing radiation: Secondary | ICD-10-CM | POA: Diagnosis not present

## 2023-03-13 DIAGNOSIS — L570 Actinic keratosis: Secondary | ICD-10-CM | POA: Diagnosis not present

## 2023-03-14 MED ORDER — ALPRAZOLAM 0.5 MG PO TABS: 0.5000 mg | ORAL_TABLET | Freq: Every evening | ORAL | 5 refills | Status: DC | PRN

## 2023-03-17 ENCOUNTER — Other Ambulatory Visit: Payer: Self-pay | Admitting: Family Medicine

## 2023-03-17 DIAGNOSIS — E782 Mixed hyperlipidemia: Secondary | ICD-10-CM

## 2023-03-19 ENCOUNTER — Telehealth: Payer: Self-pay | Admitting: Family Medicine

## 2023-03-19 DIAGNOSIS — K50919 Crohn's disease, unspecified, with unspecified complications: Secondary | ICD-10-CM | POA: Diagnosis not present

## 2023-03-19 NOTE — Telephone Encounter (Signed)
PATIENT IS WANTING TO BE SEEN SOONER SHE WOULD LIKE TO DISCUSS SOME CROHN'S ISSUE SHE IS HAVING

## 2023-03-20 ENCOUNTER — Ambulatory Visit (INDEPENDENT_AMBULATORY_CARE_PROVIDER_SITE_OTHER): Payer: Medicare HMO | Admitting: Family Medicine

## 2023-03-20 VITALS — BP 136/68 | HR 55 | Temp 97.6°F | Ht 65.0 in | Wt 181.0 lb

## 2023-03-20 DIAGNOSIS — K5080 Crohn's disease of both small and large intestine without complications: Secondary | ICD-10-CM | POA: Diagnosis not present

## 2023-03-20 DIAGNOSIS — N764 Abscess of vulva: Secondary | ICD-10-CM

## 2023-03-20 MED ORDER — HYDROCODONE-ACETAMINOPHEN 5-325 MG PO TABS
1.0000 | ORAL_TABLET | Freq: Three times a day (TID) | ORAL | 0 refills | Status: DC | PRN
Start: 2023-03-20 — End: 2023-04-17

## 2023-03-20 MED ORDER — CIPROFLOXACIN HCL 500 MG PO TABS
500.0000 mg | ORAL_TABLET | Freq: Two times a day (BID) | ORAL | 0 refills | Status: AC
Start: 2023-03-20 — End: 2023-03-27

## 2023-03-20 MED ORDER — CEFTRIAXONE SODIUM 1 G IJ SOLR
1.0000 g | Freq: Once | INTRAMUSCULAR | Status: AC
Start: 2023-03-20 — End: 2023-03-20
  Administered 2023-03-20: 1 g via INTRAMUSCULAR

## 2023-03-20 NOTE — Progress Notes (Unsigned)
Subjective:  Patient ID: Brittany [name], female    DOB: Mar 29, 1966  Age: 57 y.o. MRN: [id number]  Chief Complaint  Patient presents with   Discuss Crohns    HPI Patient states she had an abscess in her vagina that burst approx 2-3 hours ago. States she had some expired cipro that she took.starting on Monday.  States she had dysuria and vaginal pressure. Yesterday had to take pain medication, did have difficulty urinating this morning however this morning noticed some puss and blood in her underwear. Since this abscess burst the vaginal pressure, dysuria and vaginal discomfort/pressure has resolved.nausea.  Due to see GI over at [facility].       10/10/2022    9:48 AM 08/13/2022    4:03 PM 06/13/2022    1:37 PM 11/20/2021    7:54 AM 08/25/2020    9:05 AM  Depression screen PHQ 2/9  Decreased Interest 0 0 0 0 0  Down, Depressed, Hopeless 0 0 0 0 0  PHQ - 2 Score 0 0 0 0 0  Altered sleeping 1      Tired, decreased energy 3      Change in appetite 0      Feeling bad or failure about yourself  0      Trouble concentrating 0      Moving slowly or fidgety/restless 0      Suicidal thoughts 0      PHQ-9 Score 4      Difficult doing work/chores Not difficult at all            10/10/2022    9:48 AM  Fall Risk   Falls in the past year? 0  Number falls in past yr: 0  Injury with Fall? 0  Risk for fall due to : No Fall Risks  Follow up Falls evaluation completed;Falls prevention discussed    Patient Care Team: CoxFritzi Mandes, MD as PCP - General (Family Medicine) Regan Lemming, MD as PCP - Electrophysiology (Cardiology) Zettie Pho, [facility] (Inactive) (Pharmacist)   Review of Systems  Constitutional:  Negative for chills, fatigue and fever.  HENT:  Negative for congestion, ear pain, rhinorrhea and sore throat.   Respiratory:  Negative for cough and shortness of breath.   Cardiovascular:  Negative for chest pain.  Gastrointestinal:  Negative for abdominal pain, constipation,  diarrhea, nausea and vomiting.  Genitourinary:  Negative for dysuria and urgency.  Musculoskeletal:  Negative for back pain and myalgias.  Neurological:  Negative for dizziness, weakness, light-headedness and headaches.  Psychiatric/Behavioral:  Negative for dysphoric mood. The patient is not nervous/anxious.     Current Outpatient Medications on File Prior to Visit  Medication Sig Dispense Refill   ALPRAZolam (XANAX) 0.5 MG tablet Take 1 tablet (0.5 mg total) by mouth at bedtime as needed for anxiety. 30 tablet 5   Calcium Carbonate-Vitamin D 600-10 MG-MCG TABS Take 600 mg by mouth daily.     cholestyramine light (PREVALITE) 4 g packet Take 1 packet (4 g total) by mouth 3 (three) times daily. 90 packet 2   estradiol (ESTRACE) 1 MG tablet TAKE 1 TABLET EVERY DAY 90 tablet 3   ezetimibe (ZETIA) 10 MG tablet TAKE 1 TABLET EVERY DAY 90 tablet 0   finasteride (PROSCAR) 5 MG tablet TAKE 1 TABLET EVERY DAY 90 tablet 1   fish oil-omega-3 fatty acids 1000 MG capsule Take 1 g by mouth daily.     gabapentin (NEURONTIN) 300 MG capsule TAKE 2 CAPSULES AT  BEDTIME 180 capsule 3   Glucosamine-Chondroit-Vit C-Mn (GLUCOSAMINE 1500 COMPLEX PO) Take 2 tablets by mouth at bedtime.     hydrochlorothiazide (HYDRODIURIL) 25 MG tablet Take 1 tablet (25 mg total) by mouth daily. 90 tablet 0   rivaroxaban (XARELTO) 20 MG TABS tablet TAKE 1 TABLET BY MOUTH ONCE DAILY WITH SUPPER 90 tablet 1   ustekinumab (STELARA) 45 MG/0.5ML injection Inject 45 mg into the skin every 8 (eight) weeks.     No current facility-administered medications on file prior to visit.   Past Medical History:  Diagnosis Date   BMI 26.0-26.9,adult 07/06/2019   Bradycardia 08/10/2014   Crohn's disease (HCC)    DDD (degenerative disc disease), lumbar 07/17/2017   Diabetes mellitus without complication (HCC)    Fatigue 08/10/2014   Hyperlipidemia    Hyperlipidemia associated with type 2 diabetes mellitus (HCC) 07/06/2019   Hypertension     Hypertension, essential    Mixed hyperlipidemia    Palpitations 07/06/2019   Paresthesia 07/06/2019   Past Surgical History:  Procedure Laterality Date   ABDOMINAL HYSTERECTOMY N/A 10/20/2012   Procedure: HYSTERECTOMY ABDOMINAL;  Surgeon: Miguel Aschoff, MD;  Location: [facility];  Service: Gynecology;  Laterality: N/A;   ANAL FISSURE REPAIR  05/13/2000   APPENDECTOMY     ATRIAL FIBRILLATION ABLATION N/A 01/09/2023   Procedure: ATRIAL FIBRILLATION ABLATION;  Surgeon: Regan Lemming, MD;  Location: [facility];  Service: Cardiovascular;  Laterality: N/A;   CARDIOVERSION N/A 07/03/2021   Procedure: CARDIOVERSION;  Surgeon: Little Ishikawa, MD;  Location: [facility] ENDOSCOPY;  Service: Cardiovascular;  Laterality: N/A;   CATARACT EXTRACTION Bilateral    EYE SURGERY  05/31/2022   removed film   SALPINGOOPHORECTOMY Bilateral 10/20/2012   Procedure: SALPINGO OOPHORECTOMY;  Surgeon: Miguel Aschoff, MD;  Location: [facility];  Service: Gynecology;  Laterality: Bilateral;   SMALL INTESTINE SURGERY     reconstruction   TONSILLECTOMY      Family History  Problem Relation Age of Onset   Heart Problems Mother    Diabetes Mother    Diabetes Father    Heart Problems Father    Cancer Maternal Grandmother        lung   Peptic Ulcer Disease Other    Social History   Socioeconomic History   Marital status: Widowed    Spouse name: Not on file   Number of children: 1   Years of education: Not on file   Highest education level: Not on file  Occupational History   Not on file  Tobacco Use   Smoking status: Former    Current packs/day: 0.00    Types: Cigarettes    Quit date: 2002    Years since quitting: 22.8   Smokeless tobacco: Never   Tobacco comments:    Former smoker 02/05/22  Vaping Use   Vaping status: Never Used  Substance and Sexual Activity   Alcohol use: Yes    Alcohol/week: 1.0 standard drink of alcohol    Types: 1 Standard drinks or equivalent per week    Comment: 1 drink  per month 02/05/22   Drug use: No   Sexual activity: Not on file  Other Topics Concern   Not on file  Social History Narrative   wears sunscreen, brushes and flosses daily, see's dentist bi-annually, has smoke/carbon monoxide detectors, wears a seatbelt and practices gun safety   Social Determinants of Health   Financial Resource Strain: Low Risk  (06/12/2022)   Overall Financial Resource Strain (CARDIA)  Difficulty of Paying Living Expenses: Not very hard  Food Insecurity: No Food Insecurity (06/12/2022)   Hunger Vital Sign    Worried About Running Out of Food in the Last Year: Never true    Ran Out of Food in the Last Year: Never true  Transportation Needs: No Transportation Needs (06/12/2022)   PRAPARE - Administrator, Civil Service (Medical): No    Lack of Transportation (Non-Medical): No  Physical Activity: Insufficiently Active (06/12/2022)   Exercise Vital Sign    Days of Exercise per Week: 2 days    Minutes of Exercise per Session: 20 min  Stress: No Stress Concern Present (06/12/2022)   [employer] of Occupational Health - Occupational Stress Questionnaire    Feeling of Stress : Only a little  Social Connections: Moderately Integrated (06/12/2022)   Social Connection and Isolation Panel [NHANES]    Frequency of Communication with Friends and Family: More than three times a week    Frequency of Social Gatherings with Friends and Family: More than three times a week    Attends Religious Services: More than 4 times per year    Active Member of [employer] or Organizations: Yes    Attends Banker Meetings: 1 to 4 times per year    Marital Status: Widowed    Objective:  BP 136/68   Pulse (!) 55   Temp 97.6 F (36.4 C)   Ht 5\' 5"  (1.651 m)   Wt 181 lb (82.1 kg)   LMP 09/23/2012   SpO2 98%   BMI 30.12 kg/m      03/20/2023    3:57 PM 02/17/2023    4:23 PM 01/23/2023    2:04 PM  BP/Weight  Systolic BP 136 140 162  Diastolic BP 68 72 74  Wt.  (Lbs) 181 181.4 181.6  BMI 30.12 kg/m2 30.19 kg/m2 29.53 kg/m2    Physical Exam Vitals reviewed.  Constitutional:      Appearance: Normal appearance. She is normal weight.  Cardiovascular:     Rate and Rhythm: Normal rate and regular rhythm.     Heart sounds: Normal heart sounds.  Pulmonary:     Effort: Pulmonary effort is normal. No respiratory distress.     Breath sounds: Normal breath sounds.  Abdominal:     General: Abdomen is flat. Bowel sounds are normal.     Palpations: Abdomen is soft.     Tenderness: There is no abdominal tenderness.  Genitourinary:      Comments: Small amount of drainage. Not fluctuant. Tender.  Neurological:     Mental Status: She is alert and oriented to person, place, and time.  Psychiatric:        Mood and Affect: Mood normal.        Behavior: Behavior normal.     Diabetic Foot Exam - Simple   No data filed      Lab Results  Component Value Date   WBC 5.1 12/23/2022   HGB 14.1 12/23/2022   HCT 43.7 12/23/2022   PLT 214 12/23/2022   GLUCOSE 93 12/23/2022   CHOL 211 (H) 01/02/2023   TRIG 104 01/02/2023   HDL 81 01/02/2023   LDLCALC 112 (H) 01/02/2023   ALT 14 06/07/2022   AST 16 06/07/2022   NA 141 12/23/2022   K 4.9 12/23/2022   CL 103 12/23/2022   CREATININE 0.68 12/23/2022   BUN 14 12/23/2022   CO2 28 12/23/2022   TSH 2.500 10/10/2022   INR 0.86 10/20/2012  HGBA1C 5.6 10/10/2022   MICROALBUR 30 08/25/2020      Assessment & Plan:    Crohn's disease of both small and large intestine without complication (HCC) Assessment & Plan: The current medical regimen is effective;  continue present plan and medications.    Labial abscess Assessment & Plan: Rocephin 1g IM #1 was given at the office Sent ciprofloxacin 500 mg twice a day x 7 days.  Orders: -     Ciprofloxacin HCl; Take 1 tablet (500 mg total) by mouth 2 (two) times daily for 7 days.  Dispense: 14 tablet; Refill: 0 -     HYDROcodone-Acetaminophen; Take 1  tablet by mouth every 8 (eight) hours as needed for severe pain (pain score 7-10) (OSteoarthritis of lumbar spine.).  Dispense: 15 tablet; Refill: 0 -     cefTRIAXone Sodium     Meds ordered this encounter  Medications   ciprofloxacin (CIPRO) 500 MG tablet    Sig: Take 1 tablet (500 mg total) by mouth 2 (two) times daily for 7 days.    Dispense:  14 tablet    Refill:  0   HYDROcodone-acetaminophen (NORCO/VICODIN) 5-325 MG tablet    Sig: Take 1 tablet by mouth every 8 (eight) hours as needed for severe pain (pain score 7-10) (OSteoarthritis of lumbar spine.).    Dispense:  15 tablet    Refill:  0   cefTRIAXone (ROCEPHIN) injection 1 g    No orders of the defined types were placed in this encounter.    Follow-up: No follow-ups on file.   I,Katherina A Bramblett,acting as a scribe for Blane Ohara, MD.,have documented all relevant documentation on the behalf of Blane Ohara, MD,as directed by  Blane Ohara, MD while in the presence of Blane Ohara, MD.   Clayborn Bigness I Leal-Borjas,acting as a scribe for Blane Ohara, MD.,have documented all relevant documentation on the behalf of Blane Ohara, MD,as directed by  Blane Ohara, MD while in the presence of Blane Ohara, MD.    An After Visit Summary was printed and given to the patient.  Blane Ohara, MD [facility] [phone]

## 2023-03-21 ENCOUNTER — Other Ambulatory Visit: Payer: Self-pay

## 2023-03-21 ENCOUNTER — Telehealth: Payer: Self-pay

## 2023-03-21 DIAGNOSIS — N764 Abscess of vulva: Secondary | ICD-10-CM | POA: Insufficient documentation

## 2023-03-21 MED ORDER — FLUCONAZOLE 150 MG PO TABS: 150.0000 mg | ORAL_TABLET | Freq: Every day | ORAL | 0 refills | Status: DC

## 2023-03-21 NOTE — Telephone Encounter (Signed)
Copied from CRM [id number]. Topic: Clinical - Medication Question >> Mar 21, 2023  9:45 AM Dimitri Ped wrote: Reason for CRM: patient calling need to speak with dr cox. About medication

## 2023-03-21 NOTE — Telephone Encounter (Signed)
Patient called requesting rx for Diflucan be sent to pharmacy due to having vaginal itching since starting cipro. Per Dr. Sedalia Muta ok to send.

## 2023-03-21 NOTE — Assessment & Plan Note (Signed)
The current medical regimen is effective;  continue present plan and medications.  

## 2023-03-21 NOTE — Assessment & Plan Note (Signed)
Rocephin 1g IM #1 was given at the office Sent ciprofloxacin 500 mg twice a day x 7 days.

## 2023-03-22 ENCOUNTER — Encounter: Payer: Self-pay | Admitting: Family Medicine

## 2023-03-24 ENCOUNTER — Other Ambulatory Visit: Payer: Self-pay | Admitting: Family Medicine

## 2023-03-26 ENCOUNTER — Ambulatory Visit: Admit: 2023-03-26 | Discharge: 2023-03-27 | Payer: MEDICARE | Attending: Gastroenterology | Primary: Gastroenterology

## 2023-03-26 DIAGNOSIS — K5080 Crohn's disease of both small and large intestine without complications: Secondary | ICD-10-CM | POA: Diagnosis not present

## 2023-03-26 DIAGNOSIS — Z7185 Encounter for immunization safety counseling: Secondary | ICD-10-CM | POA: Diagnosis not present

## 2023-03-26 DIAGNOSIS — K9089 Other intestinal malabsorption: Secondary | ICD-10-CM | POA: Diagnosis not present

## 2023-03-26 DIAGNOSIS — K50812 Crohn's disease of both small and large intestine with intestinal obstruction: Principal | ICD-10-CM

## 2023-03-26 DIAGNOSIS — K5010 Crohn's disease of large intestine without complications: Principal | ICD-10-CM

## 2023-03-26 MED ORDER — COLESTIPOL 1 GRAM TABLET
ORAL_TABLET | Freq: Every day | ORAL | 3 refills | 90 days | Status: CP
Start: 2023-03-26 — End: 2024-03-25

## 2023-04-11 ENCOUNTER — Other Ambulatory Visit: Payer: Self-pay | Admitting: Cardiology

## 2023-04-11 DIAGNOSIS — I4819 Other persistent atrial fibrillation: Secondary | ICD-10-CM

## 2023-04-14 NOTE — Telephone Encounter (Signed)
Prescription refill request for Xarelto received.  Indication: Afib  Last office visit: 01/23/23 Nelva Bush) Weight: 82.1kg Age: 57 Scr: 0.68(12/23/22)  CrCl: 115.67ml/min  Appropriate dose. Refill sent.

## 2023-04-14 NOTE — Progress Notes (Signed)
Subjective:  Patient ID: Brittany [name], female    DOB: January 12, 1966  Age: 57 y.o. MRN: [id number]  Chief Complaint  Patient presents with   Medical Management of Chronic Issues    HPI   Afib: Taking Xarelto 20 mg daily.  Hyperlipidemia: Takes Zetia 10 mg daily, Fish oil 1000 mg  in the morning and at bedtime.  Hair loss: Finasteride 1 mg daily. Helping some.   Insomnia: xanax 0.5 mg at hs as needed.    Crohn's disease: Stelara 90 mg/ml injection every 8 weeks.  Chronic back pain with some sciatica. On gabapentin 300 mg 2 before bed.      10/10/2022    9:48 AM 08/13/2022    4:03 PM 06/13/2022    1:37 PM 11/20/2021    7:54 AM 08/25/2020    9:05 AM  Depression screen PHQ 2/9  Decreased Interest 0 0 0 0 0  Down, Depressed, Hopeless 0 0 0 0 0  PHQ - 2 Score 0 0 0 0 0  Altered sleeping 1      Tired, decreased energy 3      Change in appetite 0      Feeling bad or failure about yourself  0      Trouble concentrating 0      Moving slowly or fidgety/restless 0      Suicidal thoughts 0      PHQ-9 Score 4      Difficult doing work/chores Not difficult at all            10/10/2022    9:48 AM  Fall Risk   Falls in the past year? 0  Number falls in past yr: 0  Injury with Fall? 0  Risk for fall due to : No Fall Risks  Follow up Falls evaluation completed;Falls prevention discussed    Patient Care Team: Brittany Mandes, MD as PCP - General (Family Medicine) Regan Lemming, MD as PCP - Electrophysiology (Cardiology) Zettie Pho, [facility] (Inactive) (Pharmacist)   Review of Systems  Current Outpatient Medications on File Prior to Visit  Medication Sig Dispense Refill   fluconazole (DIFLUCAN) 150 MG tablet Take 1 tablet (150 mg total) by mouth daily. 1 tablet 0   ALPRAZolam (XANAX) 0.5 MG tablet Take 1 tablet (0.5 mg total) by mouth at bedtime as needed for anxiety. 30 tablet 5   Calcium Carbonate-Vitamin D 600-10 MG-MCG TABS Take 600 mg by mouth daily.      cholestyramine light (PREVALITE) 4 g packet Take 1 packet (4 g total) by mouth 3 (three) times daily. 90 packet 2   estradiol (ESTRACE) 1 MG tablet TAKE 1 TABLET EVERY DAY 90 tablet 3   ezetimibe (ZETIA) 10 MG tablet TAKE 1 TABLET EVERY DAY 90 tablet 0   finasteride (PROSCAR) 5 MG tablet TAKE 1 TABLET EVERY DAY 90 tablet 3   fish oil-omega-3 fatty acids 1000 MG capsule Take 1 g by mouth daily.     gabapentin (NEURONTIN) 300 MG capsule TAKE 2 CAPSULES AT BEDTIME 180 capsule 3   Glucosamine-Chondroit-Vit C-Mn (GLUCOSAMINE 1500 COMPLEX PO) Take 2 tablets by mouth at bedtime.     hydrochlorothiazide (HYDRODIURIL) 25 MG tablet Take 1 tablet (25 mg total) by mouth daily. 90 tablet 0   HYDROcodone-acetaminophen (NORCO/VICODIN) 5-325 MG tablet Take 1 tablet by mouth every 8 (eight) hours as needed for severe pain (pain score 7-10) (OSteoarthritis of lumbar spine.). 15 tablet 0   rivaroxaban (XARELTO) 20 MG TABS tablet TAKE  1 TABLET BY MOUTH ONCE DAILY WITH SUPPER 90 tablet 1   ustekinumab (STELARA) 45 MG/0.5ML injection Inject 45 mg into the skin every 8 (eight) weeks.     No current facility-administered medications on file prior to visit.   Past Medical History:  Diagnosis Date   BMI 26.0-26.9,adult 07/06/2019   Bradycardia 08/10/2014   Crohn's disease (HCC)    DDD (degenerative disc disease), lumbar 07/17/2017   Diabetes mellitus without complication (HCC)    Fatigue 08/10/2014   Hyperlipidemia    Hyperlipidemia associated with type 2 diabetes mellitus (HCC) 07/06/2019   Hypertension    Hypertension, essential    Mixed hyperlipidemia    Palpitations 07/06/2019   Paresthesia 07/06/2019   Past Surgical History:  Procedure Laterality Date   ABDOMINAL HYSTERECTOMY N/A 10/20/2012   Procedure: HYSTERECTOMY ABDOMINAL;  Surgeon: Miguel Aschoff, MD;  Location: [facility];  Service: Gynecology;  Laterality: N/A;   ANAL FISSURE REPAIR  05/13/2000   APPENDECTOMY     ATRIAL FIBRILLATION ABLATION N/A 01/09/2023    Procedure: ATRIAL FIBRILLATION ABLATION;  Surgeon: Regan Lemming, MD;  Location: [facility] INVASIVE CV [facility];  Service: Cardiovascular;  Laterality: N/A;   CARDIOVERSION N/A 07/03/2021   Procedure: CARDIOVERSION;  Surgeon: Little Ishikawa, MD;  Location: [facility] ENDOSCOPY;  Service: Cardiovascular;  Laterality: N/A;   CATARACT EXTRACTION Bilateral    EYE SURGERY  05/31/2022   removed film   SALPINGOOPHORECTOMY Bilateral 10/20/2012   Procedure: SALPINGO OOPHORECTOMY;  Surgeon: Miguel Aschoff, MD;  Location: [facility];  Service: Gynecology;  Laterality: Bilateral;   SMALL INTESTINE SURGERY     reconstruction   TONSILLECTOMY      Family History  Problem Relation Age of Onset   Heart Problems Mother    Diabetes Mother    Diabetes Father    Heart Problems Father    Cancer Maternal Grandmother        lung   Peptic Ulcer Disease Other    Social History   Socioeconomic History   Marital status: Widowed    Spouse name: Not on file   Number of children: 1   Years of education: Not on file   Highest education level: 11th grade  Occupational History   Not on file  Tobacco Use   Smoking status: Former    Current packs/day: 0.00    Types: Cigarettes    Quit date: 2002    Years since quitting: 22.9   Smokeless tobacco: Never   Tobacco comments:    Former smoker 02/05/22  Vaping Use   Vaping status: Never Used  Substance and Sexual Activity   Alcohol use: Yes    Alcohol/week: 1.0 standard drink of alcohol    Types: 1 Standard drinks or equivalent per week    Comment: 1 drink per month 02/05/22   Drug use: No   Sexual activity: Not on file  Other Topics Concern   Not on file  Social History Narrative   wears sunscreen, brushes and flosses daily, see's dentist bi-annually, has smoke/carbon monoxide detectors, wears a seatbelt and practices gun safety   Social Determinants of Health   Financial Resource Strain: Low Risk  (06/12/2022)   Overall Financial Resource Strain (CARDIA)     Difficulty of Paying Living Expenses: Not very hard  Food Insecurity: No Food Insecurity (04/11/2023)   Hunger Vital Sign    Worried About Running Out of Food in the Last Year: Never true    Ran Out of Food in the Last Year: Never true  Transportation Needs: No Transportation Needs (04/11/2023)   PRAPARE - Administrator, Civil Service (Medical): No    Lack of Transportation (Non-Medical): No  Physical Activity: Unknown (04/11/2023)   Exercise Vital Sign    Days of Exercise per Week: Patient declined    Minutes of Exercise per Session: 20 min  Stress: No Stress Concern Present (04/11/2023)   [employer] of Occupational Health - Occupational Stress Questionnaire    Feeling of Stress : Only a little  Social Connections: Moderately Integrated (04/11/2023)   Social Connection and Isolation Panel [NHANES]    Frequency of Communication with Friends and Family: More than three times a week    Frequency of Social Gatherings with Friends and Family: Three times a week    Attends Religious Services: More than 4 times per year    Active Member of Clubs or Organizations: Yes    Attends Banker Meetings: 1 to 4 times per year    Marital Status: Widowed    Objective:  LMP 09/23/2012      03/20/2023    3:57 PM 02/17/2023    4:23 PM 01/23/2023    2:04 PM  BP/Weight  Systolic BP 136 140 162  Diastolic BP 68 72 74  Wt. (Lbs) 181 181.4 181.6  BMI 30.12 kg/m2 30.19 kg/m2 29.53 kg/m2    Physical Exam  Diabetic Foot Exam - Simple   No data filed      Lab Results  Component Value Date   WBC 5.1 12/23/2022   HGB 14.1 12/23/2022   HCT 43.7 12/23/2022   PLT 214 12/23/2022   GLUCOSE 93 12/23/2022   CHOL 211 (H) 01/02/2023   TRIG 104 01/02/2023   HDL 81 01/02/2023   LDLCALC 112 (H) 01/02/2023   ALT 14 06/07/2022   AST 16 06/07/2022   NA 141 12/23/2022   K 4.9 12/23/2022   CL 103 12/23/2022   CREATININE 0.68 12/23/2022   BUN 14 12/23/2022   CO2 28  12/23/2022   TSH 2.500 10/10/2022   INR 0.86 10/20/2012   HGBA1C 5.6 10/10/2022   MICROALBUR 30 08/25/2020      Assessment & Plan:    Hyperlipidemia associated with type 2 diabetes mellitus (HCC)  Idiopathic neuropathy  Prediabetes  Mixed hyperlipidemia     No orders of the defined types were placed in this encounter.   No orders of the defined types were placed in this encounter.    Follow-up: No follow-ups on file.   I,Ermie Glendenning I Leal-Borjas,acting as a scribe for Blane Ohara, MD.,have documented all relevant documentation on the behalf of Blane Ohara, MD,as directed by  Blane Ohara, MD while in the presence of Blane Ohara, MD.   An After Visit Summary was printed and given to the patient.  Blane Ohara, MD [name] [phone]

## 2023-04-15 ENCOUNTER — Other Ambulatory Visit: Payer: Self-pay

## 2023-04-15 ENCOUNTER — Encounter (INDEPENDENT_AMBULATORY_CARE_PROVIDER_SITE_OTHER): Payer: Medicare HMO | Admitting: Family Medicine

## 2023-04-15 DIAGNOSIS — E1169 Type 2 diabetes mellitus with other specified complication: Secondary | ICD-10-CM

## 2023-04-15 DIAGNOSIS — R7303 Prediabetes: Secondary | ICD-10-CM

## 2023-04-15 DIAGNOSIS — E782 Mixed hyperlipidemia: Secondary | ICD-10-CM

## 2023-04-15 DIAGNOSIS — K5080 Crohn's disease of both small and large intestine without complications: Secondary | ICD-10-CM

## 2023-04-15 DIAGNOSIS — E785 Hyperlipidemia, unspecified: Secondary | ICD-10-CM

## 2023-04-15 DIAGNOSIS — G609 Hereditary and idiopathic neuropathy, unspecified: Secondary | ICD-10-CM

## 2023-04-16 NOTE — Progress Notes (Signed)
Subjective:  Patient ID: Brittany [name], female    DOB: 02/09/1966  Age: 57 y.o. MRN: [id number]  Chief Complaint  Patient presents with   Medical Management of Chronic Issues    Discussed the use of AI scribe software for clinical note transcription with the patient, who gave verbal consent to proceed.  History of Present Illness          The patient, with a history of Crohn's disease, atrial fibrillation, hyperlipidemia, and hypertension, presents for a routine follow-up. She has an upcoming MRI of the pelvis, scheduled due to recent pelvic symptoms, which have since resolved after a course of antibiotics. Despite the resolution of symptoms, she plans to proceed with the MRI for thoroughness due to her high risk of fistulas due to crohn's .  She has also noticed new red skin lesions on her abdomen, which she describes as 'red moles.' She has been concerned about these lesions due to a history of DIC.  The patient's Crohn's disease, have improved significantly with Stelara. She has also been prescribed colestipol by her gastroenterologist, which has improved her bowel movements.  She has been managing her atrial fibrillation with Xarelto and has an upcoming cardiology appointment. She had an ablation in the last 3 months.   She is also on Zetia and fish oil for hyperlipidemia.  Telogen effluvium: patient is on finasteride 5 mg daily for hair loss.  The patient reports a little pressure in her sinus area, which she believes may be the onset of a sinus infection. She denies any fevers, chills, sweats, earaches, or sore throat.  Insomnia: xanax 0.5 mg at night as needed.    Chronic back pain with some sciatica. On gabapentin 300 mg 2 before bed.      10/10/2022    9:48 AM 08/13/2022    4:03 PM 06/13/2022    1:37 PM 11/20/2021    7:54 AM 08/25/2020    9:05 AM  Depression screen PHQ 2/9  Decreased Interest 0 0 0 0 0  Down, Depressed, Hopeless 0 0 0 0 0  PHQ - 2 Score 0 0 0 0 0   Altered sleeping 1      Tired, decreased energy 3      Change in appetite 0      Feeling bad or failure about yourself  0      Trouble concentrating 0      Moving slowly or fidgety/restless 0      Suicidal thoughts 0      PHQ-9 Score 4      Difficult doing work/chores Not difficult at all            10/10/2022    9:48 AM  Fall Risk   Falls in the past year? 0  Number falls in past yr: 0  Injury with Fall? 0  Risk for fall due to : No Fall Risks  Follow up Falls evaluation completed;Falls prevention discussed    Patient Care Team: CoxFritzi Mandes, MD as PCP - General (Family Medicine) Regan Lemming, MD as PCP - Electrophysiology (Cardiology) Zettie Pho, [facility] (Inactive) (Pharmacist)   Review of Systems  Constitutional:  Negative for chills, fatigue and fever.  HENT:  Negative for congestion, ear pain and sore throat.   Respiratory:  Negative for cough and shortness of breath.   Cardiovascular:  Negative for chest pain.  Gastrointestinal:  Negative for abdominal pain, constipation, diarrhea, nausea and vomiting.  Genitourinary:  Negative for dysuria and  urgency.  Musculoskeletal:  Negative for arthralgias and myalgias.  Skin:  Negative for rash.  Neurological:  Negative for dizziness and headaches.  Psychiatric/Behavioral:  Negative for dysphoric mood. The patient is not nervous/anxious.     Current Outpatient Medications on File Prior to Visit  Medication Sig Dispense Refill   ALPRAZolam (XANAX) 0.5 MG tablet Take 1 tablet (0.5 mg total) by mouth at bedtime as needed for anxiety. 30 tablet 5   Calcium Carbonate-Vitamin D 600-10 MG-MCG TABS Take 600 mg by mouth daily.     colestipol (COLESTID) 1 g tablet Take by mouth.     estradiol (ESTRACE) 1 MG tablet TAKE 1 TABLET EVERY DAY 90 tablet 3   ezetimibe (ZETIA) 10 MG tablet TAKE 1 TABLET EVERY DAY 90 tablet 0   finasteride (PROSCAR) 5 MG tablet TAKE 1 TABLET EVERY DAY 90 tablet 3   fish oil-omega-3 fatty acids  1000 MG capsule Take 1 g by mouth daily.     gabapentin (NEURONTIN) 300 MG capsule TAKE 2 CAPSULES AT BEDTIME 180 capsule 3   Glucosamine-Chondroit-Vit C-Mn (GLUCOSAMINE 1500 COMPLEX PO) Take 2 tablets by mouth at bedtime.     hydrochlorothiazide (HYDRODIURIL) 25 MG tablet Take 1 tablet (25 mg total) by mouth daily. (Patient taking differently: Take 12.5 mg by mouth daily.) 90 tablet 0   rivaroxaban (XARELTO) 20 MG TABS tablet TAKE 1 TABLET BY MOUTH ONCE DAILY WITH SUPPER 90 tablet 1   ustekinumab (STELARA) 45 MG/0.5ML injection Inject 45 mg into the skin every 8 (eight) weeks.     No current facility-administered medications on file prior to visit.   Past Medical History:  Diagnosis Date   BMI 26.0-26.9,adult 07/06/2019   Bradycardia 08/10/2014   Crohn's disease (HCC)    DDD (degenerative disc disease), lumbar 07/17/2017   Diabetes mellitus without complication (HCC)    Fatigue 08/10/2014   Hyperlipidemia    Hyperlipidemia associated with type 2 diabetes mellitus (HCC) 07/06/2019   Hypertension    Hypertension, essential    Mixed hyperlipidemia    Palpitations 07/06/2019   Paresthesia 07/06/2019   Past Surgical History:  Procedure Laterality Date   ABDOMINAL HYSTERECTOMY N/A 10/20/2012   Procedure: HYSTERECTOMY ABDOMINAL;  Surgeon: Miguel Aschoff, MD;  Location: [facility];  Service: Gynecology;  Laterality: N/A;   ANAL FISSURE REPAIR  05/13/2000   APPENDECTOMY     ATRIAL FIBRILLATION ABLATION N/A 01/09/2023   Procedure: ATRIAL FIBRILLATION ABLATION;  Surgeon: Regan Lemming, MD;  Location: [facility] INVASIVE CV LAB;  Service: Cardiovascular;  Laterality: N/A;   CARDIOVERSION N/A 07/03/2021   Procedure: CARDIOVERSION;  Surgeon: Little Ishikawa, MD;  Location: [facility] ENDOSCOPY;  Service: Cardiovascular;  Laterality: N/A;   CATARACT EXTRACTION Bilateral    EYE SURGERY  05/31/2022   removed film   SALPINGOOPHORECTOMY Bilateral 10/20/2012   Procedure: SALPINGO OOPHORECTOMY;  Surgeon: Miguel Aschoff, MD;  Location: [facility];  Service: Gynecology;  Laterality: Bilateral;   SMALL INTESTINE SURGERY     reconstruction   TONSILLECTOMY      Family History  Problem Relation Age of Onset   Heart Problems Mother    Diabetes Mother    Diabetes Father    Heart Problems Father    Cancer Maternal Grandmother        lung   Peptic Ulcer Disease Other    Social History   Socioeconomic History   Marital status: Widowed    Spouse name: Not on file   Number of children: 1   Years  of education: Not on file   Highest education level: 11th grade  Occupational History   Not on file  Tobacco Use   Smoking status: Former    Current packs/day: 0.00    Types: Cigarettes    Quit date: 2002    Years since quitting: 22.9   Smokeless tobacco: Never   Tobacco comments:    Former smoker 02/05/22  Vaping Use   Vaping status: Never Used  Substance and Sexual Activity   Alcohol use: Yes    Alcohol/week: 1.0 standard drink of alcohol    Types: 1 Standard drinks or equivalent per week    Comment: socially   Drug use: No   Sexual activity: Yes    Partners: Male  Other Topics Concern   Not on file  Social History Narrative   wears sunscreen, brushes and flosses daily, see's dentist bi-annually, has smoke/carbon monoxide detectors, wears a seatbelt and practices gun safety   Social Determinants of Health   Financial Resource Strain: Low Risk  (06/12/2022)   Overall Financial Resource Strain (CARDIA)    Difficulty of Paying Living Expenses: Not very hard  Food Insecurity: No Food Insecurity (04/11/2023)   Hunger Vital Sign    Worried About Running Out of Food in the Last Year: Never true    Ran Out of Food in the Last Year: Never true  Transportation Needs: No Transportation Needs (04/11/2023)   PRAPARE - Administrator, Civil Service (Medical): No    Lack of Transportation (Non-Medical): No  Physical Activity: Unknown (04/11/2023)   Exercise Vital Sign    Days of Exercise per  Week: Patient declined    Minutes of Exercise per Session: 20 min  Stress: No Stress Concern Present (04/11/2023)   [employer] of Occupational Health - Occupational Stress Questionnaire    Feeling of Stress : Only a little  Social Connections: Moderately Integrated (04/11/2023)   Social Connection and Isolation Panel [NHANES]    Frequency of Communication with Friends and Family: More than three times a week    Frequency of Social Gatherings with Friends and Family: Three times a week    Attends Religious Services: More than 4 times per year    Active Member of Clubs or Organizations: Yes    Attends Banker Meetings: 1 to 4 times per year    Marital Status: Widowed    Objective:  BP 136/80   Pulse 66   Temp (!) 97.3 F (36.3 C)   Resp 14   Ht 5\' 5"  (1.651 m)   Wt 180 lb (81.6 kg)   LMP 09/23/2012   SpO2 96%   BMI 29.95 kg/m      04/17/2023   11:13 AM 03/20/2023    3:57 PM 02/17/2023    4:23 PM  BP/Weight  Systolic BP 136 136 140  Diastolic BP 80 68 72  Wt. (Lbs) 180 181 181.4  BMI 29.95 kg/m2 30.12 kg/m2 30.19 kg/m2    Physical Exam Vitals reviewed.  Constitutional:      Appearance: Normal appearance.  HENT:     Right Ear: Tympanic membrane, ear canal and external ear normal.     Left Ear: Tympanic membrane, ear canal and external ear normal.     Nose: Congestion (SINUS TENDERNESS BL MAXILLARY) present.     Mouth/Throat:     Pharynx: Oropharynx is clear.  Neck:     Vascular: No carotid bruit.  Cardiovascular:     Rate and Rhythm: Normal rate  and regular rhythm.     Heart sounds: Normal heart sounds. No murmur heard. Pulmonary:     Effort: Pulmonary effort is normal. No respiratory distress.     Breath sounds: Normal breath sounds.  Abdominal:     Palpations: Abdomen is soft.     Tenderness: There is no abdominal tenderness.  Lymphadenopathy:     Cervical: No cervical adenopathy.  Neurological:     Mental Status: She is alert and  oriented to person, place, and time.  Psychiatric:        Mood and Affect: Mood normal.        Behavior: Behavior normal.     Diabetic Foot Exam - Simple   Simple Foot Form  04/16/2023 11:14 PM  Visual Inspection No deformities, no ulcerations, no other skin breakdown bilaterally: Yes Sensation Testing Intact to touch and monofilament testing bilaterally: Yes Pulse Check Posterior Tibialis and Dorsalis pulse intact bilaterally: Yes Comments      Lab Results  Component Value Date   WBC 5.3 04/17/2023   HGB 14.3 04/17/2023   HCT 45.6 04/17/2023   PLT 214 04/17/2023   GLUCOSE 88 04/17/2023   CHOL 215 (H) 04/17/2023   TRIG 123 04/17/2023   HDL 88 04/17/2023   LDLCALC 106 (H) 04/17/2023   ALT 15 04/17/2023   AST 20 04/17/2023   NA 143 04/17/2023   K 4.2 04/17/2023   CL 100 04/17/2023   CREATININE 0.78 04/17/2023   BUN 14 04/17/2023   CO2 26 04/17/2023   TSH 2.500 10/10/2022   INR 0.86 10/20/2012   HGBA1C 5.8 (H) 04/17/2023   MICROALBUR 30 08/25/2020      Assessment & Plan:    Mixed hyperlipidemia Assessment & Plan: Not quite at goal.  Intolerant to statins.  Reviewed labs with patient.  Continue zetia 10 mg daily. Fish Oil 1000 mg twice daily.   Recommend continue to work on eating healthy diet and exercise.    Encounter for immunization -     Pneumococcal conjugate vaccine 20-valent  Crohn's disease of both small and large intestine without complication [facility]) Assessment & Plan: The current medical regimen is effective;  continue present plan and medications.  Continue stelara. Management per specialist.     Hyperlipidemia associated with type 2 diabetes mellitus (HCC) Assessment & Plan: Control: excellent Medicines: none Continue to work on eating a healthy diet and exercise.  Labs reviewed.   Drug-induced myopathy Assessment & Plan: Intolerant to statins.   Longstanding persistent atrial fibrillation [facility]) Assessment & Plan: Improved  with ablation.  Management per specialist.     Telogen effluvium Assessment & Plan: Improving. Continue finasteride 5 mg daily.    Acute non-recurrent sinusitis of other sinus Assessment & Plan: Zpack given.    Other orders -     Azithromycin; 2 DAILY FOR FIRST DAY, THEN DECREASE TO ONE DAILY FOR 4 MORE DAYS.  Dispense: 6 tablet; Refill: 0      Meds ordered this encounter  Medications   azithromycin (ZITHROMAX) 250 MG tablet    Sig: 2 DAILY FOR FIRST DAY, THEN DECREASE TO ONE DAILY FOR 4 MORE DAYS.    Dispense:  6 tablet    Refill:  0    Orders Placed This Encounter  Procedures   Pneumococcal conjugate vaccine 20-valent     Follow-up: Return in about 3 months (around 07/16/2023) for chronic follow up.   Clayborn Bigness I Leal-Borjas,acting as a scribe for Blane Ohara, MD.,have documented all relevant documentation on the  behalf of Blane Ohara, MD,as directed by  Blane Ohara, MD while in the presence of Blane Ohara, MD.   An After Visit Summary was printed and given to the patient. I attest that I have reviewed this visit and agree with the plan scribed by my staff.   Blane Ohara, MD [facility] [phone]

## 2023-04-17 ENCOUNTER — Other Ambulatory Visit: Payer: Medicare HMO

## 2023-04-17 ENCOUNTER — Encounter: Payer: Self-pay | Admitting: Family Medicine

## 2023-04-17 ENCOUNTER — Ambulatory Visit (INDEPENDENT_AMBULATORY_CARE_PROVIDER_SITE_OTHER): Payer: Medicare HMO | Admitting: Family Medicine

## 2023-04-17 VITALS — BP 136/80 | HR 66 | Temp 97.3°F | Resp 14 | Ht 65.0 in | Wt 180.0 lb

## 2023-04-17 DIAGNOSIS — G609 Hereditary and idiopathic neuropathy, unspecified: Secondary | ICD-10-CM | POA: Diagnosis not present

## 2023-04-17 DIAGNOSIS — K5080 Crohn's disease of both small and large intestine without complications: Secondary | ICD-10-CM | POA: Diagnosis not present

## 2023-04-17 DIAGNOSIS — E1169 Type 2 diabetes mellitus with other specified complication: Secondary | ICD-10-CM | POA: Diagnosis not present

## 2023-04-17 DIAGNOSIS — E785 Hyperlipidemia, unspecified: Secondary | ICD-10-CM | POA: Diagnosis not present

## 2023-04-17 DIAGNOSIS — E782 Mixed hyperlipidemia: Secondary | ICD-10-CM

## 2023-04-17 DIAGNOSIS — L650 Telogen effluvium: Secondary | ICD-10-CM

## 2023-04-17 DIAGNOSIS — R7303 Prediabetes: Secondary | ICD-10-CM | POA: Diagnosis not present

## 2023-04-17 DIAGNOSIS — I4811 Longstanding persistent atrial fibrillation: Secondary | ICD-10-CM | POA: Diagnosis not present

## 2023-04-17 DIAGNOSIS — G720 Drug-induced myopathy: Secondary | ICD-10-CM

## 2023-04-17 DIAGNOSIS — Z23 Encounter for immunization: Secondary | ICD-10-CM | POA: Diagnosis not present

## 2023-04-17 DIAGNOSIS — J018 Other acute sinusitis: Secondary | ICD-10-CM

## 2023-04-17 MED ORDER — AZITHROMYCIN 250 MG PO TABS: ORAL_TABLET | ORAL | 0 refills | Status: DC

## 2023-04-17 NOTE — Patient Instructions (Signed)
VISIT SUMMARY:  During today's visit, we reviewed your ongoing health conditions, including Crohn's disease, atrial fibrillation, hyperlipidemia, and hypertension. We discussed your recent pelvic symptoms, new skin lesions, and sinus pressure. We also reviewed your current medications and made plans for follow-up care and vaccinations.  YOUR PLAN:  -CROHN'S DISEASE: Crohn's disease is a chronic inflammatory bowel disease. You currently have no symptoms of a fistula, but I recommend to proceed with the scheduled MRI of your pelvis.  -ATRIAL FIBRILLATION:  Your condition is stable on Xarelto. Please continue taking Xarelto and attend your scheduled cardiology appointment in December.  -HYPERLIPIDEMIA: Hyperlipidemia is having high levels of fats in the blood. Your condition is stable with Zetia and fish oil. Continue taking these medications.  - Hair loss. Your condition has improved with finasteride. Continue taking finasteride.  -ANXIETY: Your anxiety is stable with Xanax. Continue taking Xanax 1mg  at night.  -HYPERTENSION: Hypertension is high blood pressure. Your condition is stable with half-dose hydrochlorothiazide. Continue taking this medication.  -SINUS INFECTION: You have reported sinus pressure, which may indicate a sinus infection. We have prescribed Zithromax to address this potential infection.  -GENERAL HEALTH MAINTENANCE: We administered the pneumonia vaccine today. We recommend getting the tetanus, shingles, and RSV vaccines at the pharmacy. Please schedule your Medicare annual wellness visit after February 1st and request your eye exam records from [facility].  INSTRUCTIONS:  Please follow up after your MRI results are available.

## 2023-04-18 ENCOUNTER — Encounter: Payer: Self-pay | Admitting: Family Medicine

## 2023-04-18 LAB — COMPREHENSIVE METABOLIC PANEL
ALT: 15 [IU]/L (ref 0–32)
AST: 20 [IU]/L (ref 0–40)
Albumin: 4 g/dL (ref 3.8–4.9)
Alkaline Phosphatase: 63 [IU]/L (ref 44–121)
BUN/Creatinine Ratio: 18 (ref 9–23)
BUN: 14 mg/dL (ref 6–24)
Bilirubin Total: 0.6 mg/dL (ref 0.0–1.2)
CO2: 26 mmol/L (ref 20–29)
Calcium: 9.6 mg/dL (ref 8.7–10.2)
Chloride: 100 mmol/L (ref 96–106)
Creatinine, Ser: 0.78 mg/dL (ref 0.57–1.00)
Globulin, Total: 2.4 g/dL (ref 1.5–4.5)
Glucose: 88 mg/dL (ref 70–99)
Potassium: 4.2 mmol/L (ref 3.5–5.2)
Sodium: 143 mmol/L (ref 134–144)
Total Protein: 6.4 g/dL (ref 6.0–8.5)
eGFR: 87 mL/min/{1.73_m2} (ref >59)

## 2023-04-18 LAB — CBC WITH DIFFERENTIAL/PLATELET
Basophils Absolute: 0.1 10*3/uL (ref 0.0–0.2)
Basos: 1 %
EOS (ABSOLUTE): 0.1 10*3/uL (ref 0.0–0.4)
Eos: 1 %
Hematocrit: 45.6 % (ref 34.0–46.6)
Hemoglobin: 14.3 g/dL (ref 11.1–15.9)
Immature Grans (Abs): 0 10*3/uL (ref 0.0–0.1)
Immature Granulocytes: 0 %
Lymphocytes Absolute: 2.4 10*3/uL (ref 0.7–3.1)
Lymphs: 46 %
MCH: 28.8 pg (ref 26.6–33.0)
MCHC: 31.4 g/dL — ABNORMAL LOW (ref 31.5–35.7)
MCV: 92 fL (ref 79–97)
Monocytes Absolute: 0.4 10*3/uL (ref 0.1–0.9)
Monocytes: 7 %
Neutrophils Absolute: 2.4 10*3/uL (ref 1.4–7.0)
Neutrophils: 45 %
Platelets: 214 10*3/uL (ref 150–450)
RBC: 4.96 x10E6/uL (ref 3.77–5.28)
RDW: 12.5 % (ref 11.7–15.4)
WBC: 5.3 10*3/uL (ref 3.4–10.8)

## 2023-04-18 LAB — LIPID PANEL
Chol/HDL Ratio: 2.4 {ratio} (ref 0.0–4.4)
Cholesterol, Total: 215 mg/dL — ABNORMAL HIGH (ref 100–199)
HDL: 88 mg/dL (ref >39)
LDL Chol Calc (NIH): 106 mg/dL — ABNORMAL HIGH (ref 0–99)
Triglycerides: 123 mg/dL (ref 0–149)
VLDL Cholesterol Cal: 21 mg/dL (ref 5–40)

## 2023-04-18 LAB — VITAMIN D 25 HYDROXY (VIT D DEFICIENCY, FRACTURES): Vit D, 25-Hydroxy: 29.2 ng/mL — ABNORMAL LOW (ref 30.0–100.0)

## 2023-04-18 LAB — HEMOGLOBIN A1C
Est. average glucose Bld gHb Est-mCnc: 120 mg/dL
Hgb A1c MFr Bld: 5.8 % — ABNORMAL HIGH (ref 4.8–5.6)

## 2023-04-18 LAB — VITAMIN B12: Vitamin B-12: 382 pg/mL (ref 232–1245)

## 2023-04-19 ENCOUNTER — Ambulatory Visit: Admit: 2023-04-19 | Discharge: 2023-04-20 | Payer: MEDICARE

## 2023-04-19 DIAGNOSIS — K9089 Other intestinal malabsorption: Secondary | ICD-10-CM | POA: Diagnosis not present

## 2023-04-19 DIAGNOSIS — N750 Cyst of Bartholin's gland: Secondary | ICD-10-CM | POA: Diagnosis not present

## 2023-04-19 DIAGNOSIS — K5090 Crohn's disease, unspecified, without complications: Secondary | ICD-10-CM | POA: Diagnosis not present

## 2023-04-20 DIAGNOSIS — J019 Acute sinusitis, unspecified: Secondary | ICD-10-CM | POA: Insufficient documentation

## 2023-04-20 NOTE — Assessment & Plan Note (Signed)
Improved with ablation.  Management per specialist.

## 2023-04-20 NOTE — Assessment & Plan Note (Signed)
Zpack given.

## 2023-04-20 NOTE — Assessment & Plan Note (Signed)
Improving. Continue finasteride 5 mg daily.

## 2023-04-20 NOTE — Assessment & Plan Note (Signed)
Control: excellent Medicines: none Continue to work on eating a healthy diet and exercise.  Labs reviewed.

## 2023-04-20 NOTE — Assessment & Plan Note (Signed)
The current medical regimen is effective;  continue present plan and medications.  Continue stelara. Management per specialist.

## 2023-04-20 NOTE — Assessment & Plan Note (Signed)
Intolerant to statins. 

## 2023-04-20 NOTE — Assessment & Plan Note (Signed)
Not quite at goal.  Intolerant to statins.  Reviewed labs with patient.  Continue zetia 10 mg daily. Fish Oil 1000 mg twice daily.   Recommend continue to work on eating healthy diet and exercise.

## 2023-04-21 DIAGNOSIS — K50812 Crohn's disease of both small and large intestine with intestinal obstruction: Principal | ICD-10-CM

## 2023-04-21 DIAGNOSIS — K5010 Crohn's disease of large intestine without complications: Principal | ICD-10-CM

## 2023-04-24 ENCOUNTER — Other Ambulatory Visit: Payer: Self-pay

## 2023-04-24 ENCOUNTER — Telehealth: Payer: Self-pay

## 2023-04-24 DIAGNOSIS — E559 Vitamin D deficiency, unspecified: Secondary | ICD-10-CM

## 2023-04-24 MED ORDER — VITAMIN D (ERGOCALCIFEROL) 1.25 MG (50000 UNIT) PO CAPS
50000.0000 [IU] | ORAL_CAPSULE | ORAL | 1 refills | Status: DC
Start: 2023-04-24 — End: 2023-08-07

## 2023-04-24 NOTE — Telephone Encounter (Signed)
Copied from CRM [id number]. Topic: Clinical - Lab/Test Results >> Apr 24, 2023  1:41 PM Chantha C wrote: Reason for CRM: Pt saw her Vit D is low on MyChart and wants to speak with the nurse about it, pls c/b [phone]

## 2023-04-24 NOTE — Telephone Encounter (Signed)
Spoke with patient about her vitamin d results.  Dr. Sedalia Muta: recommends vitamin d 50,000 units one capsule weekly.  Patient Made Aware, Verbalized Understanding.

## 2023-04-30 ENCOUNTER — Encounter: Payer: Self-pay | Admitting: Family Medicine

## 2023-04-30 ENCOUNTER — Ambulatory Visit: Payer: Medicare HMO | Admitting: Cardiology

## 2023-04-30 NOTE — Telephone Encounter (Signed)
 Care team updated and letter sent for eye exam notes.

## 2023-05-02 ENCOUNTER — Ambulatory Visit: Payer: Medicare HMO | Attending: Student | Admitting: Student

## 2023-05-02 ENCOUNTER — Encounter: Payer: Self-pay | Admitting: Student

## 2023-05-02 VITALS — BP 144/78 | HR 65 | Ht 65.5 in | Wt 181.8 lb

## 2023-05-02 DIAGNOSIS — I10 Essential (primary) hypertension: Secondary | ICD-10-CM | POA: Diagnosis not present

## 2023-05-02 DIAGNOSIS — I4819 Other persistent atrial fibrillation: Secondary | ICD-10-CM | POA: Diagnosis not present

## 2023-05-02 NOTE — Patient Instructions (Signed)
Medication Instructions:  Your physician recommends that you continue on your current medications as directed. Please refer to the Current Medication list given to you today.  *If you need a refill on your cardiac medications before your next appointment, please call your pharmacy*  Lab Work: None ordered If you have labs (blood work) drawn today and your tests are completely normal, you will receive your results only by: MyChart Message (if you have MyChart) OR A paper copy in the mail If you have any lab test that is abnormal or we need to change your treatment, we will call you to review the results.  Follow-Up: At [facility], you and your health needs are our priority.  As part of our continuing mission to provide you with exceptional heart care, we have created designated Provider Care Teams.  These Care Teams include your primary Cardiologist (physician) and Advanced Practice Providers (APPs -  Physician Assistants and Nurse Practitioners) who all work together to provide you with the care you need, when you need it.  Your next appointment:   6 month(s)  Provider:   Dr Elberta Fortis

## 2023-05-02 NOTE — Progress Notes (Signed)
  Electrophysiology Office Note:   Date:  05/02/2023  ID:  Brittany [name], DOB Jun 16, 1965, MRN [id number]  Primary Cardiologist: None Electrophysiologist: Will Jorja Loa, MD      History of Present Illness:   Brittany [name] is a 57 y.o. female with h/o HTN, CLD, Chrohns, and PAF s/p cablation seen today for routine electrophysiology followup.   Since last being seen in our clinic the patient reports doing well from a cardiac perspective. Recently started on hydrochlorothiazide for mild peripheral edema and BP. Otherwise,   she denies chest pain, dyspnea, PND, orthopnea, nausea, vomiting, dizziness, syncope, edema, weight gain, or early satiety. Has rare, brief palpitations but nothing persistent.   Review of systems complete and found to be negative unless listed in HPI.   EP Information / Studies Reviewed:    EKG is ordered today. Personal review as below.  EKG Interpretation Date/Time:  Friday May 02 2023 11:31:34 EST Ventricular Rate:  65 PR Interval:  200 QRS Duration:  76 QT Interval:  414 QTC Calculation: 430 R Axis:   61  Text Interpretation: Sinus rhythm with Premature atrial complexes Confirmed by Maxine Glenn [phone]) on 05/02/2023 11:46:52 AM    Arrhythmia History  S/p PVI 12/2022 with Dr. Elberta Fortis  Echo 04/2021     Physical Exam:   VS:  BP (!) 144/78   Pulse 65   Ht 5' 5.5" (1.664 m)   Wt 181 lb 12.8 oz (82.5 kg)   LMP 09/23/2012   SpO2 99%   BMI 29.79 kg/m    Wt Readings from Last 3 Encounters:  05/02/23 181 lb 12.8 oz (82.5 kg)  04/17/23 180 lb (81.6 kg)  03/20/23 181 lb (82.1 kg)     GEN: Well nourished, well developed in no acute distress NECK: No JVD; No carotid bruits CARDIAC: Regular rate and rhythm, no murmurs, rubs, gallops RESPIRATORY:  Clear to auscultation without rales, wheezing or rhonchi  ABDOMEN: Soft, non-tender, non-distended EXTREMITIES:  No edema; No deformity   ASSESSMENT AND PLAN:    Persistent atrial  fibrillation S/p ablation 12/2019 EKG today shows NSR Continue Xarelto 20 mg daily for CHA2DS2/VASc of at least 3. No persistent episodes. Rare, brief palpitations   Secondary hypercoagulable state Pt on Xarelto as above     HTN Stable on current regimen   Follow up with Dr. Elberta Fortis in 6 months  Signed, Graciella Freer, PA-C

## 2023-05-12 DIAGNOSIS — K50919 Crohn's disease, unspecified, with unspecified complications: Secondary | ICD-10-CM | POA: Diagnosis not present

## 2023-05-13 ENCOUNTER — Other Ambulatory Visit: Payer: Self-pay | Admitting: Family Medicine

## 2023-05-13 DIAGNOSIS — R600 Localized edema: Secondary | ICD-10-CM

## 2023-05-22 ENCOUNTER — Telehealth: Payer: Self-pay

## 2023-05-22 NOTE — Telephone Encounter (Signed)
   Pre-operative Risk Assessment    Patient Name: Brittany [name]  DOB: 1965/07/26 MRN: [id number]   Date of last office visit: 05/02/23 Date of next office visit: none   Request for Surgical Clearance    Procedure:  Flexible sigmoidoscopy   Date of Surgery:  Clearance TBD                                 Surgeon:  Not listed  Surgeon's Group or Practice Name:  [facility]  Phone number:  [phone] Fax number:  [phone]   Type of Clearance Requested:   - Pharmacy:  Hold Rivaroxaban  (Xarelto )     Type of Anesthesia:  Not Indicated   Additional requests/questions:    [name]   05/22/2023, 12:43 PM

## 2023-05-22 NOTE — Telephone Encounter (Signed)
 Please advise holding Xarelto prior to flexible sigmoidoscopy.   Thank you!  DW

## 2023-05-23 NOTE — Telephone Encounter (Signed)
 Patient with diagnosis of A Fib on Xarelto  for anticoagulation.    Procedure: Flexible sigmoidoscop y Date of procedure: TBD   CHA2DS2-VASc Score = 3  This indicates a 3.2% annual risk of stroke. The patient's score is based upon: CHF History: 0 HTN History: 0 Diabetes History: 1 Stroke History: 0 Vascular Disease History: 1 Age Score: 0 Gender Score: 1  CrCl 101 ml/min Platelet count 214K   Per office protocol, patient can hold Xarelto  for 2 days prior to procedure.    **This guidance is not considered finalized until pre-operative APP has relayed final recommendations.**

## 2023-05-23 NOTE — Telephone Encounter (Signed)
   Patient Name: Brittany [name]  DOB: 07-Oct-1965 MRN: [id number]  Primary Cardiologist: None  Chart reviewed as part of pre-operative protocol coverage. Pre-op clearance already addressed by colleagues in earlier phone notes. To summarize recommendations:  -Per office protocol, patient can hold Xarelto  for 2 days prior to procedure.  Please resume when medically safe to do so.  No medical clearance was requested.  Will route this bundled recommendation to requesting provider via Epic fax function and remove from pre-op pool. Please call with questions.  Orren LOISE Fabry, PA-C 05/23/2023, 2:20 PM

## 2023-06-19 ENCOUNTER — Encounter: Payer: Self-pay | Admitting: Family Medicine

## 2023-06-19 ENCOUNTER — Ambulatory Visit: Payer: Medicare Other | Admitting: Family Medicine

## 2023-06-19 VITALS — Temp 98.2°F | Wt 177.0 lb

## 2023-06-19 DIAGNOSIS — M818 Other osteoporosis without current pathological fracture: Secondary | ICD-10-CM

## 2023-06-19 DIAGNOSIS — Z0000 Encounter for general adult medical examination without abnormal findings: Secondary | ICD-10-CM | POA: Diagnosis not present

## 2023-06-19 NOTE — Progress Notes (Signed)
 Subjective:   Brittany [name] is a 58 y.o. female who presents for Medicare Annual (Subsequent) preventive examination.  Visit Complete: Virtual I connected with  Rock Crawford Baller on 06/19/23 by a audio enabled telemedicine application and verified that I am speaking with the correct person using two identifiers.  Patient Location: Home  Provider Location: Office/Clinic  I discussed the limitations of evaluation and management by telemedicine. The patient expressed understanding and agreed to proceed.  Vital Signs: Because this visit was a virtual/telehealth visit, some criteria may be missing or patient reported. Any vitals not documented were not able to be obtained and vitals that have been documented are patient reported.  Patient Medicare AWV questionnaire was completed by the patient on 06/19/23; I have confirmed that all information answered by patient is correct and no changes since this date.  HPI Patient complains of fever, cough, body aches and congestion over the last two days. She has been taking Mucinex and left over doxycycline  that she never used from a previous visit. She has a history of crohn's disease and reports having diarrhea last night. Encouraged supportive care to include Tylenol , Mucinex, cough medication and a probiotic to replace the good bacteria in the gut from the antibiotics she took. Advised her to call is symptoms got worse or were no better in another week. She said her fever is better today but had been as high as 101 and 102 for 2 days.   I ordered DEX scan that is due and encouraged her to reach out to her GI doctor in reference to sigmoid imaging he recommended.   Cardiac Risk Factors include: advanced age (>26men, >30 women);diabetes mellitus;dyslipidemia     Objective:    Today's Vitals   06/19/23 1139  Temp: (!) 101 F (38.3 C)  Weight: 177 lb (80.3 kg)   Body mass index is 29.01 kg/m.     01/09/2023    6:18 AM 11/01/2022    1:51  PM 01/30/2022    5:28 PM 07/03/2021    8:06 AM 06/10/2020    2:33 PM 07/29/2017   10:13 AM 10/20/2012    4:35 PM  Advanced Directives  Does Patient Have a Medical Advance Directive? No No No No No No Patient does not have advance directive  Would patient like information on creating a medical advance directive? No - Patient declined   No - Patient declined  No - Patient declined   Pre-existing out of facility DNR order (yellow form or pink MOST form)       No    Current Medications (verified) Outpatient Encounter Medications as of 06/19/2023  Medication Sig   ALPRAZolam  (XANAX ) 0.5 MG tablet Take 1 tablet (0.5 mg total) by mouth at bedtime as needed for anxiety.   azithromycin  (ZITHROMAX ) 250 MG tablet 2 DAILY FOR FIRST DAY, THEN DECREASE TO ONE DAILY FOR 4 MORE DAYS.   Calcium Carbonate-Vitamin D  600-10 MG-MCG TABS Take 600 mg by mouth daily.   colestipol (COLESTID) 1 g tablet Take by mouth.   estradiol (ESTRACE) 1 MG tablet TAKE 1 TABLET EVERY DAY   ezetimibe  (ZETIA ) 10 MG tablet TAKE 1 TABLET EVERY DAY   finasteride  (PROSCAR ) 5 MG tablet TAKE 1 TABLET EVERY DAY   fish oil-omega-3 fatty acids 1000 MG capsule Take 1 g by mouth daily.   gabapentin  (NEURONTIN ) 300 MG capsule TAKE 2 CAPSULES AT BEDTIME   Glucosamine-Chondroit-Vit C-Mn (GLUCOSAMINE 1500 COMPLEX PO) Take 2 tablets by mouth at bedtime.  hydrochlorothiazide  (HYDRODIURIL ) 25 MG tablet Take 1 tablet by mouth once daily   rivaroxaban  (XARELTO ) 20 MG TABS tablet TAKE 1 TABLET BY MOUTH ONCE DAILY WITH SUPPER   ustekinumab  (STELARA ) 45 MG/0.5ML injection Inject 45 mg into the skin every 8 (eight) weeks.   Vitamin D , Ergocalciferol , (DRISDOL ) 1.25 MG (50000 UNIT) CAPS capsule Take 1 capsule (50,000 Units total) by mouth every 7 (seven) days.   No facility-administered encounter medications on file as of 06/19/2023.    Allergies (verified) Mercaptopurine, Amitriptyline, Crestor [rosuvastatin calcium], Metoprolol , Trazodone and  nefazodone, Zocor [simvastatin], Morphine and codeine, and Penicillins   History: Past Medical History:  Diagnosis Date   BMI 26.0-26.9,adult 07/06/2019   Bradycardia 08/10/2014   Crohn's disease (HCC)    DDD (degenerative disc disease), lumbar 07/17/2017   Diabetes mellitus without complication (HCC)    Fatigue 08/10/2014   Hyperlipidemia    Hyperlipidemia associated with type 2 diabetes mellitus (HCC) 07/06/2019   Hypertension    Hypertension, essential    Mixed hyperlipidemia    Palpitations 07/06/2019   Paresthesia 07/06/2019   Past Surgical History:  Procedure Laterality Date   ABDOMINAL HYSTERECTOMY N/A 10/20/2012   Procedure: HYSTERECTOMY ABDOMINAL;  Surgeon: Peggye Gull, MD;  Location: [facility];  Service: Gynecology;  Laterality: N/A;   ANAL FISSURE REPAIR  05/13/2000   APPENDECTOMY     ATRIAL FIBRILLATION ABLATION N/A 01/09/2023   Procedure: ATRIAL FIBRILLATION ABLATION;  Surgeon: Inocencio Soyla Lunger, MD;  Location: [facility];  Service: Cardiovascular;  Laterality: N/A;   CARDIOVERSION N/A 07/03/2021   Procedure: CARDIOVERSION;  Surgeon: Kate Lonni CROME, MD;  Location[facility];  Service: Cardiovascular;  Laterality: N/A;   CATARACT EXTRACTION Bilateral    EYE SURGERY  05/31/2022   removed film   SALPINGOOPHORECTOMY Bilateral 10/20/2012   Procedure: SALPINGO OOPHORECTOMY;  Surgeon: Peggye Gull, MD;  Location: [facility];  Service: Gynecology;  Laterality: Bilateral;   SMALL INTESTINE SURGERY     reconstruction   TONSILLECTOMY     Family History  Problem Relation Age of Onset   Heart Problems Mother    Diabetes Mother    Diabetes Father    Heart Problems Father    Cancer Maternal Grandmother        lung   Peptic Ulcer Disease Other    Social History   Socioeconomic History   Marital status: Widowed    Spouse name: Not on file   Number of children: 1   Years of education: Not on file   Highest education level: 11th grade  Occupational History   Not  on file  Tobacco Use   Smoking status: Former    Current packs/day: 0.00    Types: Cigarettes    Quit date: 2002    Years since quitting: 23.1   Smokeless tobacco: Never   Tobacco comments:    Former smoker 02/05/22  Vaping Use   Vaping status: Never Used  Substance and Sexual Activity   Alcohol use: Yes    Alcohol/week: 1.0 standard drink of alcohol    Types: 1 Standard drinks or equivalent per week    Comment: socially   Drug use: No   Sexual activity: Yes    Partners: Male  Other Topics Concern   Not on file  Social History Narrative   wears sunscreen, brushes and flosses daily, see's dentist bi-annually, has smoke/carbon monoxide detectors, wears a seatbelt and practices gun safety   Social Drivers of Health   Financial Resource Strain: Patient Declined (  06/19/2023)   Overall Financial Resource Strain (CARDIA)    Difficulty of Paying Living Expenses: Patient declined  Food Insecurity: Patient Declined (06/19/2023)   Hunger Vital Sign    Worried About Running Out of Food in the Last Year: Patient declined    Ran Out of Food in the Last Year: Patient declined  Transportation Needs: No Transportation Needs (06/19/2023)   PRAPARE - Administrator, Civil Service (Medical): No    Lack of Transportation (Non-Medical): No  Physical Activity: Unknown (06/19/2023)   Exercise Vital Sign    Days of Exercise per Week: 3 days    Minutes of Exercise per Session: Patient declined  Stress: No Stress Concern Present (06/19/2023)   [employer] of Occupational Health - Occupational Stress Questionnaire    Feeling of Stress : Only a little  Social Connections: Moderately Integrated (06/19/2023)   Social Connection and Isolation Panel [NHANES]    Frequency of Communication with Friends and Family: More than three times a week    Frequency of Social Gatherings with Friends and Family: More than three times a week    Attends Religious Services: More than 4 times per year    Active  Member of [employer] or Organizations: Yes    Attends Banker Meetings: More than 4 times per year    Marital Status: Widowed    Tobacco Counseling Counseling given: Not Answered Tobacco comments: Former smoker 02/05/22   Clinical Intake:  Pre-visit preparation completed: Yes  Pain : No/denies pain     Nutritional Status: BMI 25 -29 Overweight Nutritional Risks: None Diabetes: Yes CBG done?: No Did pt. bring in CBG monitor from home?: No  How often do you need to have someone help you when you read instructions, pamphlets, or other written materials from your doctor or pharmacy?: 1 - Never  Interpreter Needed?: No      Activities of Daily Living    06/19/2023   12:15 PM  In your present state of health, do you have any difficulty performing the following activities:  Hearing? 0  Vision? 0  Difficulty concentrating or making decisions? 0  Walking or climbing stairs? 0  Dressing or bathing? 0  Doing errands, shopping? 0  Preparing Food and eating ? N  Using the Toilet? N  In the past six months, have you accidently leaked urine? N  Do you have problems with loss of bowel control? N  Managing your Medications? N  Managing your Finances? N  Housekeeping or managing your Housekeeping? N    Patient Care Team: CoxAbigail, MD as PCP - General (Family Medicine) Inocencio Soyla Lunger, MD as PCP - Electrophysiology (Cardiology) Nyle Rankin POUR, [facility] (Inactive) (Pharmacist) Ablott, Roselie (Optometry)  Indicate any recent Medical Services you may have received from other than [facility] providers in the past year (date may be approximate).     Assessment:   This is a routine wellness examination for [place].  Hearing/Vision screen No results found.   Goals Addressed             This Visit's Progress    Pharmacy Care Plan - HLD   On track    CARE PLAN ENTRY (see longitudinal plan of care for additional care plan information)  Current Barriers:   Uncontrolled hyperlipidemia, complicated by statin intolerance Current antihyperlipidemic regimen: diet/lifestyle Previous antihyperlipidemic medications tried simvastatin, rosuvastatin Most recent lipid panel:     Component Value Date/Time   CHOL 228 (H) 07/06/2019 0910   TRIG  126 07/06/2019 0910   HDL 74 07/06/2019 0910   CHOLHDL 3.1 07/06/2019 0910   LDLCALC 132 (H) 07/06/2019 0910   ASCVD risk enhancing conditions: age >62, DM, HTN, CKD, CHF, current smoker 10-year ASCVD risk score: 5%  Pharmacist Clinical Goal(s):  Over the next 90 days, patient will work with PharmD and providers towards optimized antihyperlipidemic therapy Optimize diet and exercise to improve cholesterol levels.   Interventions: Comprehensive medication review performed; medication list updated in electronic medical record.  Inter-disciplinary care team collaboration (see longitudinal plan of care) Discussed importance of lowering ASCVD risk including risk/benefit of initiating statin therapy and controlling bp Pharmacist helped coordinate follow-up appointment with Dr. Sherre for visit and labs.   Patient Self Care Activities:  Patient will focus on lifestyle by not eating so late in the day.  Initial goal documentation and Please see past updates related to this goal by clicking on the Past Updates button in the selected goal        Depression Screen    10/10/2022    9:48 AM 08/13/2022    4:03 PM 06/13/2022    1:37 PM 11/20/2021    7:54 AM 08/25/2020    9:05 AM 06/10/2020    2:34 PM 02/17/2020    8:25 AM  PHQ 2/9 Scores  PHQ - 2 Score 0 0 0 0 0 0 0  PHQ- 9 Score 4          Fall Risk    06/19/2023   12:17 PM 10/10/2022    9:48 AM 08/13/2022    4:03 PM 06/12/2022    2:44 PM 11/20/2021    7:54 AM  Fall Risk   Falls in the past year? 0 0 0 0 0  Number falls in past yr: 0 0 0 0 0  Injury with Fall? 0 0 0 0 0  Risk for fall due to : No Fall Risks No Fall Risks No Fall Risks No Fall Risks No Fall Risks   Follow up Falls evaluation completed Falls evaluation completed;Falls prevention discussed Falls evaluation completed;Falls prevention discussed Falls evaluation completed;Education provided Falls evaluation completed    MEDICARE RISK AT HOME: Medicare Risk at Home Any stairs in or around the home?: No If so, are there any without handrails?: No Home free of loose throw rugs in walkways, pet beds, electrical cords, etc?: Yes Adequate lighting in your home to reduce risk of falls?: Yes Life alert?: No Use of a cane, walker or w/c?: No Grab bars in the bathroom?: Yes Shower chair or bench in shower?: Yes Elevated toilet seat or a handicapped toilet?: Yes  TIMED UP AND GO:  Was the test performed?  No    Cognitive Function:        06/13/2022    1:41 PM  6CIT Screen  What Year? 0 points  What month? 0 points  What time? 0 points  Count back from 20 0 points  Months in reverse 0 points  Repeat phrase 0 points  Total Score 0 points    Immunizations Immunization History  Administered Date(s) Administered   Influenza Inj Mdck Quad Pf 02/17/2020, 01/02/2021, 02/21/2022   Influenza, Mdck, Trivalent,PF 6+ MOS(egg free) 02/17/2023   Influenza,inj,Quad PF,6+ Mos 02/17/2020   Influenza-Unspecified 06/03/2019, 02/17/2020   PFIZER(Purple Top)SARS-COV-2 Vaccination 07/30/2019, 08/25/2019   PNEUMOCOCCAL CONJUGATE-20 04/17/2023   Pneumococcal Conjugate-13 11/01/2015   Pneumococcal Polysaccharide-23 03/15/2013   Td (Adult) 02/28/2014   Tdap 04/28/2023    TDAP status: Up to date  Flu  Vaccine status: Up to date  Pneumococcal vaccine status: Up to date  Covid-19 vaccine status: Completed vaccines  Qualifies for Shingles Vaccine? No   Zostavax completed No   Shingrix Completed?: No.    Education has been provided regarding the importance of this vaccine. Patient has been advised to call insurance company to determine out of pocket expense if they have not yet received this  vaccine. Advised may also receive vaccine at local pharmacy or Health Dept. Verbalized acceptance and understanding.  Screening Tests Health Maintenance  Topic Date Due   Zoster Vaccines- Shingrix (1 of 2) Never done   OPHTHALMOLOGY EXAM  02/24/2022   Diabetic kidney evaluation - Urine ACR  06/08/2023   COVID-19 Vaccine (3 - Pfizer risk series) 10/12/2023 (Originally 09/22/2019)   FOOT EXAM  10/10/2023   HEMOGLOBIN A1C  10/16/2023   MAMMOGRAM  10/18/2023   Diabetic kidney evaluation - eGFR measurement  04/16/2024   Medicare Annual Wellness (AWV)  06/18/2024   Colonoscopy  05/01/2027   DTaP/Tdap/Td (2 - Td or Tdap) 04/27/2033   Pneumococcal Vaccine 26-18 Years old  Completed   INFLUENZA VACCINE  Completed   Hepatitis C Screening  Completed   HPV VACCINES  Aged Out   HIV Screening  Discontinued    Health Maintenance  Health Maintenance Due  Topic Date Due   Zoster Vaccines- Shingrix (1 of 2) Never done   OPHTHALMOLOGY EXAM  02/24/2022   Diabetic kidney evaluation - Urine ACR  06/08/2023    Colorectal cancer screening: Type of screening: Colonoscopy. Completed 04/30/22. Repeat every 5 years  Mammogram status: Completed 10/17/21. Repeat every year  Bone Density status: Completed 02/03/2018. Results reflect: Bone density results: OSTEOPOROSIS. Repeat every 2 years.   Additional Screening:  Hepatitis C Screening: does not qualify; Completed 02/23/19  Vision Screening: Recommended annual ophthalmology exams for early detection of glaucoma and other disorders of the eye. Is the patient up to date with their annual eye exam?  Yes  Who is the provider or what is the name of the office in which the patient attends annual eye exams? [facility] If pt is not established with a provider, would they like to be referred to a provider to establish care? No .   Dental Screening: Recommended annual dental exams for proper oral hygiene  Diabetic Foot Exam: Diabetic Foot Exam:  Completed 04/17/23  Community Resource Referral / Chronic Care Management: CRR required this visit?  No   CCM required this visit?  No     Plan:     I have personally reviewed and noted the following in the patient's chart:   Medical and social history Use of alcohol, tobacco or illicit drugs  Current medications and supplements including opioid prescriptions. Patient is not currently taking opioid prescriptions. Functional ability and status Nutritional status Physical activity Advanced directives List of other physicians Hospitalizations, surgeries, and ER visits in previous 12 months Vitals Screenings to include cognitive, depression, and falls Referrals and appointments  In addition, I have reviewed and discussed with patient certain preventive protocols, quality metrics, and best practice recommendations. A written personalized care plan for preventive services as well as general preventive health recommendations were provided to patient.     Harrie Cedar, FNP [name] [phone]   06/19/2023   After Visit Summary: (Declined) Due to this being a telephonic visit, with patients personalized plan was offered to patient but patient Declined AVS at this time

## 2023-06-24 ENCOUNTER — Other Ambulatory Visit (HOSPITAL_BASED_OUTPATIENT_CLINIC_OR_DEPARTMENT_OTHER): Payer: Medicare Other | Admitting: Radiology

## 2023-06-26 ENCOUNTER — Ambulatory Visit: Payer: Medicare Other | Admitting: Family Medicine

## 2023-06-26 ENCOUNTER — Encounter: Payer: Self-pay | Admitting: Family Medicine

## 2023-06-26 VITALS — BP 130/72 | HR 56 | Temp 98.2°F | Ht 65.5 in | Wt 178.0 lb

## 2023-06-26 DIAGNOSIS — K5080 Crohn's disease of both small and large intestine without complications: Secondary | ICD-10-CM

## 2023-06-26 DIAGNOSIS — J0100 Acute maxillary sinusitis, unspecified: Secondary | ICD-10-CM

## 2023-06-26 MED ORDER — AZITHROMYCIN 250 MG PO TABS
ORAL_TABLET | ORAL | 0 refills | Status: DC
Start: 2023-06-26 — End: 2023-07-21

## 2023-06-26 NOTE — Progress Notes (Signed)
 Acute Office Visit  Subjective:    Patient ID: Brittany [name], female    DOB: June 27, 1965, 58 y.o.   MRN: [id number]  Chief Complaint  Patient presents with   URI    Discussed the use of AI scribe software for clinical note transcription with the patient, who gave verbal consent to proceed.  HPI: Patient is in today for had video last week with [place] Levo. Last week had a temp 103 for three days but was negative for covid but wonders if she had the flu. C/o facial pain and pressure,congestion, coughing at night and fatigue. She presents with head pressure and a cough that worsens at night and with temperature changes. She initially suspected the flu and was advised against taking antibiotics due to potential gastrointestinal side effects related to her Crohn's disease. She has been managing her symptoms with Mucinex. She also reports pressure in her teeth, suggesting a possible sinus infection.  In addition to her current symptoms, the patient is dealing with issues related to her Crohn's medication, Stelara. She received conflicting information about her approval for the medication and the associated copay, which she needs to resolve. She also expresses interest in receiving the shingles vaccine and is seeking advice on the timing of the vaccine in relation to her Stelara injections.   Past Medical History:  Diagnosis Date   BMI 26.0-26.9,adult 07/06/2019   Bradycardia 08/10/2014   Crohn's disease (HCC)    DDD (degenerative disc disease), lumbar 07/17/2017   Diabetes mellitus without complication (HCC)    Fatigue 08/10/2014   Hyperlipidemia    Hyperlipidemia associated with type 2 diabetes mellitus (HCC) 07/06/2019   Hypertension    Hypertension, essential    Mixed hyperlipidemia    Palpitations 07/06/2019   Paresthesia 07/06/2019    Past Surgical History:  Procedure Laterality Date   ABDOMINAL HYSTERECTOMY N/A 10/20/2012   Procedure: HYSTERECTOMY ABDOMINAL;  Surgeon: Miguel Aschoff, MD;   Location: [facility];  Service: Gynecology;  Laterality: N/A;   ANAL FISSURE REPAIR  05/13/2000   APPENDECTOMY     ATRIAL FIBRILLATION ABLATION N/A 01/09/2023   Procedure: ATRIAL FIBRILLATION ABLATION;  Surgeon: Regan Lemming, MD;  Location: [facility];  Service: Cardiovascular;  Laterality: N/A;   CARDIOVERSION N/A 07/03/2021   Procedure: CARDIOVERSION;  Surgeon: Little Ishikawa, MD;  Location: [facility];  Service: Cardiovascular;  Laterality: N/A;   CATARACT EXTRACTION Bilateral    EYE SURGERY  05/31/2022   removed film   SALPINGOOPHORECTOMY Bilateral 10/20/2012   Procedure: SALPINGO OOPHORECTOMY;  Surgeon: Miguel Aschoff, MD;  Location: [facility];  Service: Gynecology;  Laterality: Bilateral;   SMALL INTESTINE SURGERY     reconstruction   TONSILLECTOMY      Family History  Problem Relation Age of Onset   Heart Problems Mother    Diabetes Mother    Diabetes Father    Heart Problems Father    Cancer Maternal Grandmother        lung   Peptic Ulcer Disease Other     Social History   Socioeconomic History   Marital status: Widowed    Spouse name: Not on file   Number of children: 1   Years of education: Not on file   Highest education level: 11th grade  Occupational History   Not on file  Tobacco Use   Smoking status: Former    Current packs/day: 0.00    Types: Cigarettes    Quit date: 2002    Years since quitting:  23.1   Smokeless tobacco: Never   Tobacco comments:    Former smoker 02/05/22  Vaping Use   Vaping status: Never Used  Substance and Sexual Activity   Alcohol use: Yes    Alcohol/week: 1.0 standard drink of alcohol    Types: 1 Standard drinks or equivalent per week    Comment: socially   Drug use: No   Sexual activity: Yes    Partners: Male  Other Topics Concern   Not on file  Social History Narrative   wears sunscreen, brushes and flosses daily, see's dentist bi-annually, has smoke/carbon monoxide detectors, wears a seatbelt and  practices gun safety   Social Drivers of Health   Financial Resource Strain: Patient Declined (06/19/2023)   Overall Financial Resource Strain (CARDIA)    Difficulty of Paying Living Expenses: Patient declined  Food Insecurity: Patient Declined (06/19/2023)   Hunger Vital Sign    Worried About Running Out of Food in the Last Year: Patient declined    Ran Out of Food in the Last Year: Patient declined  Transportation Needs: No Transportation Needs (06/19/2023)   PRAPARE - Administrator, Civil Service (Medical): No    Lack of Transportation (Non-Medical): No  Physical Activity: Unknown (06/19/2023)   Exercise Vital Sign    Days of Exercise per Week: 3 days    Minutes of Exercise per Session: Patient declined  Stress: No Stress Concern Present (06/19/2023)   [employer] of Occupational Health - Occupational Stress Questionnaire    Feeling of Stress : Only a little  Social Connections: Moderately Integrated (06/19/2023)   Social Connection and Isolation Panel [NHANES]    Frequency of Communication with Friends and Family: More than three times a week    Frequency of Social Gatherings with Friends and Family: More than three times a week    Attends Religious Services: More than 4 times per year    Active Member of [employer] or Organizations: Yes    Attends Banker Meetings: More than 4 times per year    Marital Status: Widowed  Intimate Partner Violence: Not At Risk (06/13/2022)   Humiliation, Afraid, Rape, and Kick questionnaire    Fear of Current or Ex-Partner: No    Emotionally Abused: No    Physically Abused: No    Sexually Abused: No    Outpatient Medications Prior to Visit  Medication Sig Dispense Refill   ALPRAZolam (XANAX) 0.5 MG tablet Take 1 tablet (0.5 mg total) by mouth at bedtime as needed for anxiety. 30 tablet 5   Calcium Carbonate-Vitamin D 600-10 MG-MCG TABS Take 600 mg by mouth daily.     colestipol (COLESTID) 1 g tablet Take by mouth.      estradiol (ESTRACE) 1 MG tablet TAKE 1 TABLET EVERY DAY 90 tablet 3   ezetimibe (ZETIA) 10 MG tablet TAKE 1 TABLET EVERY DAY 90 tablet 0   finasteride (PROSCAR) 5 MG tablet TAKE 1 TABLET EVERY DAY 90 tablet 3   fish oil-omega-3 fatty acids 1000 MG capsule Take 1 g by mouth daily.     gabapentin (NEURONTIN) 300 MG capsule TAKE 2 CAPSULES AT BEDTIME 180 capsule 3   Glucosamine-Chondroit-Vit C-Mn (GLUCOSAMINE 1500 COMPLEX PO) Take 2 tablets by mouth at bedtime.     hydrochlorothiazide (HYDRODIURIL) 25 MG tablet Take 1 tablet by mouth once daily 90 tablet 0   rivaroxaban (XARELTO) 20 MG TABS tablet TAKE 1 TABLET BY MOUTH ONCE DAILY WITH SUPPER 90 tablet 1   ustekinumab (STELARA)  45 MG/0.5ML injection Inject 45 mg into the skin every 8 (eight) weeks.     Vitamin D, Ergocalciferol, (DRISDOL) 1.25 MG (50000 UNIT) CAPS capsule Take 1 capsule (50,000 Units total) by mouth every 7 (seven) days. 12 capsule 1   azithromycin (ZITHROMAX) 250 MG tablet 2 DAILY FOR FIRST DAY, THEN DECREASE TO ONE DAILY FOR 4 MORE DAYS. 6 tablet 0   No facility-administered medications prior to visit.    Allergies  Allergen Reactions   Mercaptopurine     Used to treat pt. Crohn's Disease.  Caused Pancreatis 1992. Other reaction(s): Other (See Comments) Caused Pancreatis 1992   Amitriptyline Other (See Comments)    No emotions  Felt poorly while taking   Crestor [Rosuvastatin Calcium]     Muscle pain   Metoprolol Other (See Comments)    Extreme fatigue    Trazodone And Nefazodone     Did not work for pt    Zocor [Simvastatin]     Cramps in legs   Morphine And Codeine Itching   Penicillins Rash    Review of Systems  Constitutional:  Positive for fatigue. Negative for chills and fever.  HENT:  Positive for congestion, dental problem (teeth hurt), sinus pressure and sneezing. Negative for ear pain, postnasal drip, rhinorrhea, sinus pain and sore throat.   Respiratory:  Positive for cough (at night). Negative for  shortness of breath.   Cardiovascular:  Negative for chest pain.  Gastrointestinal:  Negative for diarrhea and nausea.  Neurological:  Negative for dizziness and headaches.       Objective:        06/26/2023    8:12 AM 06/19/2023   11:39 AM 05/02/2023   11:36 AM  Vitals with BMI  Height 5' 5.5"  5' 5.5"  Weight 178 lbs 177 lbs 181 lbs 13 oz  BMI 29.16  29.78  Systolic 130  144  Diastolic 72  78  Pulse 56  65    No data found.   Physical Exam Vitals reviewed.  Constitutional:      Appearance: Normal appearance.  HENT:     Right Ear: Tympanic membrane, ear canal and external ear normal.     Left Ear: Tympanic membrane, ear canal and external ear normal.     Nose: Congestion present. No rhinorrhea.     Comments: BL sinus tenderness.    Mouth/Throat:     Pharynx: Oropharynx is clear. No oropharyngeal exudate or posterior oropharyngeal erythema.  Cardiovascular:     Rate and Rhythm: Normal rate and regular rhythm.     Heart sounds: Normal heart sounds. No murmur heard. Pulmonary:     Effort: Pulmonary effort is normal. No respiratory distress.     Breath sounds: Normal breath sounds.  Lymphadenopathy:     Cervical: No cervical adenopathy.  Neurological:     Mental Status: She is alert and oriented to person, place, and time.  Psychiatric:        Mood and Affect: Mood normal.        Behavior: Behavior normal.     Health Maintenance Due  Topic Date Due   Zoster Vaccines- Shingrix (1 of 2) Never done   OPHTHALMOLOGY EXAM  02/24/2022   Diabetic kidney evaluation - Urine ACR  06/08/2023    There are no preventive care reminders to display for this patient.   Lab Results  Component Value Date   TSH 2.500 10/10/2022   Lab Results  Component Value Date   WBC 5.3 04/17/2023  HGB 14.3 04/17/2023   HCT 45.6 04/17/2023   MCV 92 04/17/2023   PLT 214 04/17/2023   Lab Results  Component Value Date   NA 143 04/17/2023   K 4.2 04/17/2023   CO2 26 04/17/2023    GLUCOSE 88 04/17/2023   BUN 14 04/17/2023   CREATININE 0.78 04/17/2023   BILITOT 0.6 04/17/2023   ALKPHOS 63 04/17/2023   AST 20 04/17/2023   ALT 15 04/17/2023   PROT 6.4 04/17/2023   ALBUMIN 4.0 04/17/2023   CALCIUM 9.6 04/17/2023   ANIONGAP 10 11/01/2022   EGFR 87 04/17/2023   Lab Results  Component Value Date   CHOL 215 (H) 04/17/2023   Lab Results  Component Value Date   HDL 88 04/17/2023   Lab Results  Component Value Date   LDLCALC 106 (H) 04/17/2023   Lab Results  Component Value Date   TRIG 123 04/17/2023   Lab Results  Component Value Date   CHOLHDL 2.4 04/17/2023   Lab Results  Component Value Date   HGBA1C 5.8 (H) 04/17/2023       Assessment & Plan:  Acute non-recurrent maxillary sinusitis Assessment & Plan: Post-flu symptoms with persistent cough, pressure in head, and tenderness over sinuses. Likely transitioned from viral illness to bacterial sinusitis. -Start antibiotic therapy.  Orders: -     Azithromycin; 2 DAILY FOR FIRST DAY, THEN DECREASE TO ONE DAILY FOR 4 MORE DAYS.  Dispense: 6 tablet; Refill: 0  Crohn's disease of both small and large intestine without complication (HCC) Assessment & Plan: Concerns about the cost and approval of Stelara. -Advise patient to reapply for the program or seek assistance to reduce the cost.      Meds ordered this encounter  Medications   azithromycin (ZITHROMAX) 250 MG tablet    Sig: 2 DAILY FOR FIRST DAY, THEN DECREASE TO ONE DAILY FOR 4 MORE DAYS.    Dispense:  6 tablet    Refill:  0    No orders of the defined types were placed in this encounter.    Follow-up: Return if symptoms worsen or fail to improve.  An After Visit Summary was printed and given to the patient.   Clayborn Bigness I Leal-Borjas,acting as a scribe for Blane Ohara, MD.,have documented all relevant documentation on the behalf of Blane Ohara, MD,as directed by  Blane Ohara, MD while in the presence of Blane Ohara, MD.   I attest  that I have reviewed this visit and agree with the plan scribed by my staff.   Blane Ohara, MD [facility] [phone]

## 2023-06-29 NOTE — Patient Instructions (Signed)
 Shingles Vaccination Patient expressed interest in receiving the shingles vaccine, but unsure about the timing with Stelara. -Advise patient to consult with gastroenterologist regarding the timing of the shingles vaccine in relation to Stelara administration.

## 2023-06-29 NOTE — Assessment & Plan Note (Signed)
 Concerns about the cost and approval of Stelara. -Advise patient to reapply for the program or seek assistance to reduce the cost.

## 2023-06-29 NOTE — Assessment & Plan Note (Signed)
 Post-flu symptoms with persistent cough, pressure in head, and tenderness over sinuses. Likely transitioned from viral illness to bacterial sinusitis. -Start antibiotic therapy.

## 2023-07-01 ENCOUNTER — Ambulatory Visit (INDEPENDENT_AMBULATORY_CARE_PROVIDER_SITE_OTHER)
Admission: RE | Admit: 2023-07-01 | Discharge: 2023-07-01 | Disposition: A | Discharge status: Home / Self Care | Payer: Medicare Other | Source: Ambulatory Visit | Attending: Family Medicine | Admitting: Family Medicine

## 2023-07-01 DIAGNOSIS — M818 Other osteoporosis without current pathological fracture: Secondary | ICD-10-CM

## 2023-07-01 DIAGNOSIS — Z0189 Encounter for other specified special examinations: Secondary | ICD-10-CM | POA: Diagnosis not present

## 2023-07-05 DIAGNOSIS — K50919 Crohn's disease, unspecified, with unspecified complications: Secondary | ICD-10-CM | POA: Diagnosis not present

## 2023-07-20 NOTE — Progress Notes (Signed)
 Subjective:  Patient ID: Brittany [name], female    DOB: Aug 10, 1965  Age: 58 y.o. MRN: [id number]  Chief Complaint  Patient presents with   Medical Management of Chronic Issues    Discussed the use of AI scribe software for clinical note transcription with the patient, who gave verbal consent to proceed.  History of Present Illness   The patient presents for a routine three-month follow-up visit. She reports a residual cough and occasional runny nose from a recent illness, but overall feels better. She has been taking Stelara for crohn's disease and inquires about the timing of a shingles vaccine in relation to her Stelara injections. She is also on Xarelto for atrial fibrillation, Zetia and fish oil for cholesterol management, and hydrochlorothiazide for slightly elevated blood pressure.  The patient has been experiencing hair loss, which she has been managing with finasteride, but she is unsure of its effectiveness. She has started a new treatment involving castor oil and frankincense, which has made her hair softer and may potentially promote growth.  She also mentions a gastrointestinal issue that was previously managed with colestipol, but she discontinued the medication due to stomach discomfort and constipation. She has chosen to manage the issue without medication for now.  The patient also discusses personal issues, including the care of a dependent and the impact on her personal life and future plans.      Afib: Taking Xarelto 20 mg daily.  Hyperlipidemia: Takes Zetia 10 mg daily, Fish oil 1000 mg  in the morning and at bedtime.  Hair loss: Finasteride 5 mg daily. Helping some. Castor oil and frankincense.   Insomnia: xanax 0.5 mg at hs as needed.    Crohn's disease: Stelara 90 mg/ml injection every 8 weeks.  Hypertension: hydrochlorothiazide 25 mg daily.       10/10/2022    9:48 AM 08/13/2022    4:03 PM 06/13/2022    1:37 PM 11/20/2021    7:54 AM 08/25/2020    9:05 AM   Depression screen PHQ 2/9  Decreased Interest 0 0 0 0 0  Down, Depressed, Hopeless 0 0 0 0 0  PHQ - 2 Score 0 0 0 0 0  Altered sleeping 1      Tired, decreased energy 3      Change in appetite 0      Feeling bad or failure about yourself  0      Trouble concentrating 0      Moving slowly or fidgety/restless 0      Suicidal thoughts 0      PHQ-9 Score 4      Difficult doing work/chores Not difficult at all            06/19/2023   12:17 PM  Fall Risk   Falls in the past year? 0  Number falls in past yr: 0  Injury with Fall? 0  Risk for fall due to : No Fall Risks  Follow up Falls evaluation completed    Patient Care Team: Blane Ohara, MD as PCP - General (Family Medicine) Regan Lemming, MD as PCP - Electrophysiology (Cardiology) Zettie Pho, [facility] (Inactive) (Pharmacist) Ablott, Claris Gower (Optometry)   Review of Systems  Constitutional:  Negative for chills, fatigue and fever.  HENT:  Negative for congestion, ear pain, rhinorrhea and sore throat.   Respiratory:  Negative for cough and shortness of breath.   Cardiovascular:  Negative for chest pain.  Gastrointestinal:  Negative for abdominal pain, constipation, diarrhea, nausea  and vomiting.  Genitourinary:  Negative for dysuria and urgency.  Musculoskeletal:  Negative for back pain and myalgias.  Neurological:  Negative for dizziness, weakness, light-headedness and headaches.  Psychiatric/Behavioral:  Negative for dysphoric mood. The patient is not nervous/anxious.     Current Outpatient Medications on File Prior to Visit  Medication Sig Dispense Refill   ALPRAZolam (XANAX) 0.5 MG tablet Take 1 tablet (0.5 mg total) by mouth at bedtime as needed for anxiety. 30 tablet 5   Calcium Carbonate-Vitamin D 600-10 MG-MCG TABS Take 600 mg by mouth daily.     estradiol (ESTRACE) 1 MG tablet TAKE 1 TABLET EVERY DAY 90 tablet 3   fish oil-omega-3 fatty acids 1000 MG capsule Take 1 g by mouth daily.      Glucosamine-Chondroit-Vit C-Mn (GLUCOSAMINE 1500 COMPLEX PO) Take 2 tablets by mouth at bedtime.     hydrochlorothiazide (HYDRODIURIL) 25 MG tablet Take 1 tablet by mouth once daily 90 tablet 0   rivaroxaban (XARELTO) 20 MG TABS tablet TAKE 1 TABLET BY MOUTH ONCE DAILY WITH SUPPER 90 tablet 1   ustekinumab (STELARA) 45 MG/0.5ML injection Inject 45 mg into the skin every 8 (eight) weeks.     Vitamin D, Ergocalciferol, (DRISDOL) 1.25 MG (50000 UNIT) CAPS capsule Take 1 capsule (50,000 Units total) by mouth every 7 (seven) days. 12 capsule 1   No current facility-administered medications on file prior to visit.   Past Medical History:  Diagnosis Date   BMI 26.0-26.9,adult 07/06/2019   Bradycardia 08/10/2014   Crohn's disease (HCC)    DDD (degenerative disc disease), lumbar 07/17/2017   Diabetes mellitus without complication (HCC)    Fatigue 08/10/2014   Hyperlipidemia    Hyperlipidemia associated with type 2 diabetes mellitus (HCC) 07/06/2019   Hypertension    Hypertension, essential    Mixed hyperlipidemia    Palpitations 07/06/2019   Paresthesia 07/06/2019   Past Surgical History:  Procedure Laterality Date   ABDOMINAL HYSTERECTOMY N/A 10/20/2012   Procedure: HYSTERECTOMY ABDOMINAL;  Surgeon: Miguel Aschoff, MD;  Location: [facility];  Service: Gynecology;  Laterality: N/A;   ANAL FISSURE REPAIR  05/13/2000   APPENDECTOMY     ATRIAL FIBRILLATION ABLATION N/A 01/09/2023   Procedure: ATRIAL FIBRILLATION ABLATION;  Surgeon: Regan Lemming, MD;  Location: [facility] INVASIVE CV LAB;  Service: Cardiovascular;  Laterality: N/A;   CARDIOVERSION N/A 07/03/2021   Procedure: CARDIOVERSION;  Surgeon: Little Ishikawa, MD;  Location: [facility] ENDOSCOPY;  Service: Cardiovascular;  Laterality: N/A;   CATARACT EXTRACTION Bilateral    EYE SURGERY  05/31/2022   removed film   SALPINGOOPHORECTOMY Bilateral 10/20/2012   Procedure: SALPINGO OOPHORECTOMY;  Surgeon: Miguel Aschoff, MD;  Location: [facility];  Service:  Gynecology;  Laterality: Bilateral;   SMALL INTESTINE SURGERY     reconstruction   TONSILLECTOMY      Family History  Problem Relation Age of Onset   Heart Problems Mother    Diabetes Mother    Diabetes Father    Heart Problems Father    Cancer Maternal Grandmother        lung   Peptic Ulcer Disease Other    Social History   Socioeconomic History   Marital status: Widowed    Spouse name: Not on file   Number of children: 1   Years of education: Not on file   Highest education level: 11th grade  Occupational History   Not on file  Tobacco Use   Smoking status: Former    Current packs/day: 0.00  Types: Cigarettes    Quit date: 2002    Years since quitting: 23.2   Smokeless tobacco: Never   Tobacco comments:    Former smoker 02/05/22  Vaping Use   Vaping status: Never Used  Substance and Sexual Activity   Alcohol use: Yes    Alcohol/week: 1.0 standard drink of alcohol    Types: 1 Standard drinks or equivalent per week    Comment: socially   Drug use: No   Sexual activity: Yes    Partners: Male  Other Topics Concern   Not on file  Social History Narrative   wears sunscreen, brushes and flosses daily, see's dentist bi-annually, has smoke/carbon monoxide detectors, wears a seatbelt and practices gun safety   Social Drivers of Health   Financial Resource Strain: Patient Declined (06/19/2023)   Overall Financial Resource Strain (CARDIA)    Difficulty of Paying Living Expenses: Patient declined  Food Insecurity: Patient Declined (06/19/2023)   Hunger Vital Sign    Worried About Running Out of Food in the Last Year: Patient declined    Ran Out of Food in the Last Year: Patient declined  Transportation Needs: No Transportation Needs (06/19/2023)   PRAPARE - Administrator, Civil Service (Medical): No    Lack of Transportation (Non-Medical): No  Physical Activity: Unknown (06/19/2023)   Exercise Vital Sign    Days of Exercise per Week: 3 days    Minutes of  Exercise per Session: Patient declined  Stress: No Stress Concern Present (06/19/2023)   [employer] of Occupational Health - Occupational Stress Questionnaire    Feeling of Stress : Only a little  Social Connections: Moderately Integrated (06/19/2023)   Social Connection and Isolation Panel [NHANES]    Frequency of Communication with Friends and Family: More than three times a week    Frequency of Social Gatherings with Friends and Family: More than three times a week    Attends Religious Services: More than 4 times per year    Active Member of [employer] or Organizations: Yes    Attends Banker Meetings: More than 4 times per year    Marital Status: Widowed    Objective:  BP 132/64   Pulse 72   Temp 98.2 F (36.8 C)   Ht 5' 5.5" (1.664 m)   Wt 181 lb (82.1 kg)   LMP 09/23/2012   SpO2 98%   BMI 29.66 kg/m      07/21/2023    8:06 AM 06/26/2023    8:12 AM 06/19/2023   11:39 AM  BP/Weight  Systolic BP 132 130   Diastolic BP 64 72   Wt. (Lbs) 181 178 177  BMI 29.66 kg/m2 29.17 kg/m2 29.01 kg/m2    Physical Exam Vitals reviewed.  Constitutional:      Appearance: Normal appearance. She is normal weight.  Neck:     Vascular: No carotid bruit.  Cardiovascular:     Rate and Rhythm: Normal rate and regular rhythm.     Heart sounds: Normal heart sounds.  Pulmonary:     Effort: Pulmonary effort is normal. No respiratory distress.     Breath sounds: Normal breath sounds.  Abdominal:     General: Abdomen is flat. Bowel sounds are normal.     Palpations: Abdomen is soft.     Tenderness: There is no abdominal tenderness.  Skin:    Comments: Thinning hair  Neurological:     Mental Status: She is alert and oriented to person, place, and time.  Psychiatric:        Mood and Affect: Mood normal.        Behavior: Behavior normal.     Diabetic Foot Exam - Simple   No data filed      Lab Results  Component Value Date   WBC 6.1 07/21/2023   HGB 14.8 07/21/2023    HCT 46.1 07/21/2023   PLT 220 07/21/2023   GLUCOSE 92 07/21/2023   CHOL 233 (H) 07/21/2023   TRIG 163 (H) 07/21/2023   HDL 96 07/21/2023   LDLCALC 110 (H) 07/21/2023   ALT 15 07/21/2023   AST 18 07/21/2023   NA 142 07/21/2023   K 4.1 07/21/2023   CL 100 07/21/2023   CREATININE 0.77 07/21/2023   BUN 14 07/21/2023   CO2 26 07/21/2023   TSH 2.500 10/10/2022   INR 0.86 10/20/2012   HGBA1C 5.7 (H) 07/21/2023   MICROALBUR 30 08/25/2020      Assessment & Plan:   Longstanding persistent atrial fibrillation (HCC) Assessment & Plan: Improved with ablation.  Continue xarelto Management per specialist.     Hyperlipidemia associated with type 2 diabetes mellitus (HCC) Assessment & Plan: Control: excellent Medicines: none Continue to work on eating a healthy diet and exercise.  Labs reviewed.  Orders: -     Comprehensive metabolic panel -     Hemoglobin A1c -     Microalbumin / creatinine urine ratio  Crohn's disease of both small and large intestine without complication (HCC) Assessment & Plan: Continue stelara   Orders: -     CBC with Differential/Platelet  Mixed hyperlipidemia Assessment & Plan: Not quite at goal.  Intolerant to statins.  Reviewed labs with patient.  Continue zetia 10 mg daily. Fish Oil 1000 mg twice daily.   Recommend continue to work on eating healthy diet and exercise.   Orders: -     CBC with Differential/Platelet -     Lipid panel -     Ezetimibe; Take 1 tablet (10 mg total) by mouth daily.  Dispense: 90 tablet; Refill: 0  Idiopathic neuropathy Assessment & Plan: The current medical regimen is effective;  continue present plan and medications. Continue gabapentin 300 mg 2 at bedtime.   Orders: -     Gabapentin; Take 2 capsules (600 mg total) by mouth at bedtime.  Dispense: 180 capsule; Refill: 3  Vitamin D insufficiency Assessment & Plan: Completed prescribed course of Vitamin D. -Check Vitamin D level.  Orders: -      VITAMIN D 25 Hydroxy (Vit-D Deficiency, Fractures)  Hair loss Assessment & Plan: Improving. -Continue finasteride, castor oil, and frankincense treatment. -Referral to dermatologist Dr. Isaac Laud for further evaluation.  Orders: -     Finasteride; Take 1 tablet (5 mg total) by mouth daily.  Dispense: 90 tablet; Refill: 3 -     Ambulatory referral to Dermatology  Myalgia due to statin Assessment & Plan: Intolerant to statins.      Meds ordered this encounter  Medications   gabapentin (NEURONTIN) 300 MG capsule    Sig: Take 2 capsules (600 mg total) by mouth at bedtime.    Dispense:  180 capsule    Refill:  3   ezetimibe (ZETIA) 10 MG tablet    Sig: Take 1 tablet (10 mg total) by mouth daily.    Dispense:  90 tablet    Refill:  0   finasteride (PROSCAR) 5 MG tablet    Sig: Take 1 tablet (5 mg total) by mouth daily.  Dispense:  90 tablet    Refill:  3    Orders Placed This Encounter  Procedures   CBC with Differential/Platelet   Comprehensive metabolic panel   Hemoglobin A1c   Lipid panel   Microalbumin / creatinine urine ratio   VITAMIN D 25 Hydroxy (Vit-D Deficiency, Fractures)   Ambulatory referral to Dermatology     Follow-up: Return in about 4 months (around 11/20/2023) for chronic follow up.   I,Marla I Leal-Borjas,acting as a scribe for Blane Ohara, MD.,have documented all relevant documentation on the behalf of Blane Ohara, MD,as directed by  Blane Ohara, MD while in the presence of Blane Ohara, MD.   An After Visit Summary was printed and given to the patient.  I attest that I have reviewed this visit and agree with the plan scribed by my staff.   Blane Ohara, MD [facility] [phone]

## 2023-07-21 ENCOUNTER — Ambulatory Visit (INDEPENDENT_AMBULATORY_CARE_PROVIDER_SITE_OTHER): Payer: Medicare HMO | Admitting: Family Medicine

## 2023-07-21 ENCOUNTER — Encounter: Payer: Self-pay | Admitting: Family Medicine

## 2023-07-21 VITALS — BP 132/64 | HR 72 | Temp 98.2°F | Ht 65.5 in | Wt 181.0 lb

## 2023-07-21 DIAGNOSIS — L659 Nonscarring hair loss, unspecified: Secondary | ICD-10-CM

## 2023-07-21 DIAGNOSIS — E782 Mixed hyperlipidemia: Secondary | ICD-10-CM

## 2023-07-21 DIAGNOSIS — T466X5A Adverse effect of antihyperlipidemic and antiarteriosclerotic drugs, initial encounter: Secondary | ICD-10-CM | POA: Diagnosis not present

## 2023-07-21 DIAGNOSIS — E785 Hyperlipidemia, unspecified: Secondary | ICD-10-CM

## 2023-07-21 DIAGNOSIS — I4811 Longstanding persistent atrial fibrillation: Secondary | ICD-10-CM | POA: Diagnosis not present

## 2023-07-21 DIAGNOSIS — M791 Myalgia, unspecified site: Secondary | ICD-10-CM

## 2023-07-21 DIAGNOSIS — E1169 Type 2 diabetes mellitus with other specified complication: Secondary | ICD-10-CM | POA: Diagnosis not present

## 2023-07-21 DIAGNOSIS — G609 Hereditary and idiopathic neuropathy, unspecified: Secondary | ICD-10-CM

## 2023-07-21 DIAGNOSIS — E559 Vitamin D deficiency, unspecified: Secondary | ICD-10-CM | POA: Insufficient documentation

## 2023-07-21 DIAGNOSIS — K5080 Crohn's disease of both small and large intestine without complications: Secondary | ICD-10-CM | POA: Diagnosis not present

## 2023-07-21 MED ORDER — EZETIMIBE 10 MG PO TABS
10.0000 mg | ORAL_TABLET | Freq: Every day | ORAL | 0 refills | Status: DC
Start: 2023-07-21 — End: 2023-10-15

## 2023-07-21 MED ORDER — FINASTERIDE 5 MG PO TABS: 5.0000 mg | ORAL_TABLET | Freq: Every day | ORAL | 3 refills | Status: AC

## 2023-07-21 MED ORDER — GABAPENTIN 300 MG PO CAPS: 600.0000 mg | ORAL_CAPSULE | Freq: Every day | ORAL | 3 refills | Status: AC

## 2023-07-21 NOTE — Assessment & Plan Note (Signed)
 Not quite at goal.  Intolerant to statins.  Reviewed labs with patient.  Continue zetia 10 mg daily. Fish Oil 1000 mg twice daily.   Recommend continue to work on eating healthy diet and exercise.

## 2023-07-21 NOTE — Assessment & Plan Note (Addendum)
 Improving. -Continue finasteride, castor oil, and frankincense treatment. -Referral to dermatologist Dr. Isaac Laud for further evaluation.

## 2023-07-21 NOTE — Patient Instructions (Signed)
 VISIT SUMMARY:  During your routine follow-up visit, we discussed your residual cough and occasional runny nose from a recent illness, your current medications, and several ongoing health concerns including hair thinning, gastrointestinal issues, and personal matters. We also reviewed your management plan for atrial fibrillation, cholesterol, hypertension, and Crohn's disease.  YOUR PLAN:  -RESIDUAL UPPER RESPIRATORY INFECTION: You have a mild cough and occasional runny nose that are improving. Continue with supportive care measures such as staying hydrated and resting.  -SHINGLES VACCINATION: We discussed the timing of your shingles vaccine in relation to your Stelara injections. Please consult with your gastroenterologist for the best timing, and plan to receive the vaccine at a pharmacy.  -ATRIAL FIBRILLATION: Your atrial fibrillation is stable with the use of Xarelto. Continue taking Xarelto as prescribed.  -HYPERLIPIDEMIA: Your cholesterol levels are stable with Zetia and fish oil. Continue taking these medications as prescribed.  -HAIR THINNING: You are experiencing hair thinning and have started using castor oil and frankincense, which have improved your hair texture. Continue with these treatments and finasteride. You will be referred to Dr. Isaac Laud, a dermatologist, for further evaluation.  -CROHN'S DISEASE: Your Crohn's disease is stable with Stelara. Continue taking Stelara as prescribed.  -HYPERTENSION: Your blood pressure is stable with hydrochlorothiazide. Continue taking hydrochlorothiazide as prescribed.  -VITAMIN D DEFICIENCY: You have completed your prescribed course of Vitamin D. We will check your Vitamin D levels to ensure they are within the normal range.  -HYPERGLYCEMIA: Your last A1C test showed slightly elevated blood sugar levels. We will check your A1C again to monitor your blood sugar levels.  -GENERAL HEALTH MAINTENANCE: We will perform a complete blood  count, chemistry panel, cholesterol panel, and urine analysis. Continue with healthy eating and regular exercise, and monitor for any depressive symptoms.  INSTRUCTIONS:  Please follow up with your gastroenterologist regarding the timing of your shingles vaccine. Additionally, schedule an appointment with Dr. Isaac Laud for further evaluation of your hair thinning. We will also need to check your Vitamin D levels and A1C. Continue with your current medications and supportive care measures as discussed.

## 2023-07-21 NOTE — Assessment & Plan Note (Signed)
Continue stelara. ?

## 2023-07-22 ENCOUNTER — Encounter: Payer: Self-pay | Admitting: Family Medicine

## 2023-07-22 LAB — CBC WITH DIFFERENTIAL/PLATELET
Basophils Absolute: 0.1 10*3/uL (ref 0.0–0.2)
Basos: 1 %
EOS (ABSOLUTE): 0.1 10*3/uL (ref 0.0–0.4)
Eos: 2 %
Hematocrit: 46.1 % (ref 34.0–46.6)
Hemoglobin: 14.8 g/dL (ref 11.1–15.9)
Immature Grans (Abs): 0 10*3/uL (ref 0.0–0.1)
Immature Granulocytes: 0 %
Lymphocytes Absolute: 2.1 10*3/uL (ref 0.7–3.1)
Lymphs: 35 %
MCH: 29.5 pg (ref 26.6–33.0)
MCHC: 32.1 g/dL (ref 31.5–35.7)
MCV: 92 fL (ref 79–97)
Monocytes Absolute: 0.5 10*3/uL (ref 0.1–0.9)
Monocytes: 8 %
Neutrophils Absolute: 3.3 10*3/uL (ref 1.4–7.0)
Neutrophils: 54 %
Platelets: 220 10*3/uL (ref 150–450)
RBC: 5.01 x10E6/uL (ref 3.77–5.28)
RDW: 13.8 % (ref 11.7–15.4)
WBC: 6.1 10*3/uL (ref 3.4–10.8)

## 2023-07-22 LAB — COMPREHENSIVE METABOLIC PANEL
ALT: 15 IU/L (ref 0–32)
AST: 18 IU/L (ref 0–40)
Albumin: 4.3 g/dL (ref 3.8–4.9)
Alkaline Phosphatase: 57 IU/L (ref 44–121)
BUN/Creatinine Ratio: 18 (ref 9–23)
BUN: 14 mg/dL (ref 6–24)
Bilirubin Total: 0.6 mg/dL (ref 0.0–1.2)
CO2: 26 mmol/L (ref 20–29)
Calcium: 10 mg/dL (ref 8.7–10.2)
Chloride: 100 mmol/L (ref 96–106)
Creatinine, Ser: 0.77 mg/dL (ref 0.57–1.00)
Globulin, Total: 2.7 g/dL (ref 1.5–4.5)
Glucose: 92 mg/dL (ref 70–99)
Potassium: 4.1 mmol/L (ref 3.5–5.2)
Sodium: 142 mmol/L (ref 134–144)
Total Protein: 7 g/dL (ref 6.0–8.5)
eGFR: 89 mL/min/{1.73_m2} (ref >59)

## 2023-07-22 LAB — MICROALBUMIN / CREATININE URINE RATIO
Creatinine, Urine: 98.6 mg/dL
Microalb/Creat Ratio: <3 mg/g{creat} (ref 0–29)
Microalbumin, Urine: <3 ug/mL

## 2023-07-22 LAB — VITAMIN D 25 HYDROXY (VIT D DEFICIENCY, FRACTURES): Vit D, 25-Hydroxy: 49.6 ng/mL (ref 30.0–100.0)

## 2023-07-22 LAB — LIPID PANEL
Chol/HDL Ratio: 2.4 ratio (ref 0.0–4.4)
Cholesterol, Total: 233 mg/dL — ABNORMAL HIGH (ref 100–199)
HDL: 96 mg/dL (ref >39)
LDL Chol Calc (NIH): 110 mg/dL — ABNORMAL HIGH (ref 0–99)
Triglycerides: 163 mg/dL — ABNORMAL HIGH (ref 0–149)
VLDL Cholesterol Cal: 27 mg/dL (ref 5–40)

## 2023-07-22 LAB — HEMOGLOBIN A1C
Est. average glucose Bld gHb Est-mCnc: 117 mg/dL
Hgb A1c MFr Bld: 5.7 % — ABNORMAL HIGH (ref 4.8–5.6)

## 2023-07-26 NOTE — Assessment & Plan Note (Signed)
 The current medical regimen is effective;  continue present plan and medications. Continue gabapentin 300 mg 2 at bedtime.

## 2023-07-26 NOTE — Assessment & Plan Note (Signed)
 Control: excellent Medicines: none Continue to work on eating a healthy diet and exercise.  Labs reviewed.

## 2023-07-26 NOTE — Assessment & Plan Note (Signed)
 Improved with ablation.  Continue xarelto Management per specialist.

## 2023-07-26 NOTE — Assessment & Plan Note (Signed)
 Completed prescribed course of Vitamin D. -Check Vitamin D level.

## 2023-07-26 NOTE — Assessment & Plan Note (Signed)
 Intolerant to statins.

## 2023-08-04 ENCOUNTER — Other Ambulatory Visit: Payer: Self-pay

## 2023-08-04 NOTE — Telephone Encounter (Signed)
 Reason for CRM: Patient would like to know if provider or nurse will call her something in for a yeast infection.

## 2023-08-04 NOTE — Telephone Encounter (Signed)
 Copied from CRM [id number]. Topic: Clinical - Medication Question >> Aug 04, 2023 10:53 AM Brittany [name] wrote: Reason for CRM: Patient would like to know if provider or nurse will call her something in for a yeast infection. Please use the [facility] [address] [place] [address] [place] [address]. Please contact patient at [phone] once completed

## 2023-08-05 ENCOUNTER — Encounter: Payer: Self-pay | Admitting: Family Medicine

## 2023-08-05 ENCOUNTER — Other Ambulatory Visit: Payer: Self-pay | Admitting: Family Medicine

## 2023-08-05 MED ORDER — FLUCONAZOLE 150 MG PO TABS
150.0000 mg | ORAL_TABLET | Freq: Once | ORAL | 0 refills | Status: AC
Start: 2023-08-05 — End: 2023-08-05

## 2023-08-05 MED ORDER — FLUCONAZOLE 150 MG PO TABS
150.0000 mg | ORAL_TABLET | Freq: Every day | ORAL | 0 refills | Status: DC
Start: 2023-08-05 — End: 2023-08-07

## 2023-08-06 ENCOUNTER — Encounter: Payer: Self-pay | Admitting: Family Medicine

## 2023-08-07 ENCOUNTER — Ambulatory Visit

## 2023-08-07 ENCOUNTER — Ambulatory Visit: Payer: Self-pay

## 2023-08-07 VITALS — BP 110/78 | HR 57 | Temp 97.7°F | Ht 65.5 in | Wt 184.4 lb

## 2023-08-07 DIAGNOSIS — B029 Zoster without complications: Secondary | ICD-10-CM

## 2023-08-07 DIAGNOSIS — K5090 Crohn's disease, unspecified, without complications: Secondary | ICD-10-CM | POA: Diagnosis not present

## 2023-08-07 MED ORDER — VALACYCLOVIR HCL 1 G PO TABS: 1000.0000 mg | ORAL_TABLET | Freq: Three times a day (TID) | ORAL | 1 refills | Status: AC

## 2023-08-07 NOTE — Progress Notes (Signed)
 Acute Office Visit  Subjective:    Patient ID: Brittany [name], female    DOB: 01/27/1966, 58 y.o.   MRN: [id number]  Chief Complaint  Patient presents with   Rash    Discussed the use of AI scribe software for clinical note transcription with the patient, who gave verbal consent to proceed.       HPI: Brittany [name] is a 58 year old female with shingles who presents with a painful rash. She was referred by Dr. Sedalia Muta for evaluation of a suspected shingles rash.  Symptoms began last Friday with itching and burning in the genital area, initially thought to be a female infection. Over the weekend, the symptoms worsened, and by Sunday, she experienced pain in the right upper thigh. She noticed a rash on her back that appeared like a mosquito bite after showering.  The rash consists of clusters of dry vesicles on an erythematous base, located on the right buttock and in the genital area, specifically on the right side. The vesicles were oozing last night but have mostly dried up and crusted over. The rash is painful, particularly in the genital area, and she describes the sensation as a 'burning stone'.  She has a history of Crohn's disease and is currently on Stelara, which she receives every eight weeks. She also takes gabapentin, two capsules at bedtime, for back pain following surgery. She recently received the first part of a shingles vaccination last Wednesday, two days before the onset of symptoms.  She feels generally unwell, with body aches and flu-like symptoms. She has experienced some nausea, which she attributes to the pain and possibly her medication. She has been trying to manage the pain with ice packs.  Past Medical History:  Diagnosis Date   BMI 26.0-26.9,adult 07/06/2019   Bradycardia 08/10/2014   Crohn's disease (HCC)    DDD (degenerative disc disease), lumbar 07/17/2017   Diabetes mellitus without complication (HCC)    Fatigue 08/10/2014   Hyperlipidemia     Hyperlipidemia associated with type 2 diabetes mellitus (HCC) 07/06/2019   Hypertension    Hypertension, essential    Mixed hyperlipidemia    Palpitations 07/06/2019   Paresthesia 07/06/2019    Past Surgical History:  Procedure Laterality Date   ABDOMINAL HYSTERECTOMY N/A 10/20/2012   Procedure: HYSTERECTOMY ABDOMINAL;  Surgeon: Miguel Aschoff, MD;  Location: [facility];  Service: Gynecology;  Laterality: N/A;   ANAL FISSURE REPAIR  05/13/2000   APPENDECTOMY     ATRIAL FIBRILLATION ABLATION N/A 01/09/2023   Procedure: ATRIAL FIBRILLATION ABLATION;  Surgeon: Regan Lemming, MD;  Location: [facility];  Service: Cardiovascular;  Laterality: N/A;   CARDIOVERSION N/A 07/03/2021   Procedure: CARDIOVERSION;  Surgeon: Little Ishikawa, MD;  Location: [facility] ENDOSCOPY;  Service: Cardiovascular;  Laterality: N/A;   CATARACT EXTRACTION Bilateral    EYE SURGERY  05/31/2022   removed film   SALPINGOOPHORECTOMY Bilateral 10/20/2012   Procedure: SALPINGO OOPHORECTOMY;  Surgeon: Miguel Aschoff, MD;  Location: [facility];  Service: Gynecology;  Laterality: Bilateral;   SMALL INTESTINE SURGERY     reconstruction   TONSILLECTOMY      Family History  Problem Relation Age of Onset   Heart Problems Mother    Diabetes Mother    Diabetes Father    Heart Problems Father    Cancer Maternal Grandmother        lung   Peptic Ulcer Disease Other     Social History   Socioeconomic History  Marital status: Widowed    Spouse name: Not on file   Number of children: 1   Years of education: Not on file   Highest education level: 11th grade  Occupational History   Not on file  Tobacco Use   Smoking status: Former    Current packs/day: 0.00    Types: Cigarettes    Quit date: 2002    Years since quitting: 23.2   Smokeless tobacco: Never   Tobacco comments:    Former smoker 02/05/22  Vaping Use   Vaping status: Never Used  Substance and Sexual Activity   Alcohol use: Yes    Alcohol/week: 1.0  standard drink of alcohol    Types: 1 Standard drinks or equivalent per week    Comment: socially   Drug use: No   Sexual activity: Yes    Partners: Male  Other Topics Concern   Not on file  Social History Narrative   wears sunscreen, brushes and flosses daily, see's dentist bi-annually, has smoke/carbon monoxide detectors, wears a seatbelt and practices gun safety   Social Drivers of Health   Financial Resource Strain: Patient Declined (06/19/2023)   Overall Financial Resource Strain (CARDIA)    Difficulty of Paying Living Expenses: Patient declined  Food Insecurity: Patient Declined (06/19/2023)   Hunger Vital Sign    Worried About Running Out of Food in the Last Year: Patient declined    Ran Out of Food in the Last Year: Patient declined  Transportation Needs: No Transportation Needs (06/19/2023)   PRAPARE - Administrator, Civil Service (Medical): No    Lack of Transportation (Non-Medical): No  Physical Activity: Unknown (06/19/2023)   Exercise Vital Sign    Days of Exercise per Week: 3 days    Minutes of Exercise per Session: Patient declined  Stress: No Stress Concern Present (06/19/2023)   [employer] of Occupational Health - Occupational Stress Questionnaire    Feeling of Stress : Only a little  Social Connections: Moderately Integrated (06/19/2023)   Social Connection and Isolation Panel [NHANES]    Frequency of Communication with Friends and Family: More than three times a week    Frequency of Social Gatherings with Friends and Family: More than three times a week    Attends Religious Services: More than 4 times per year    Active Member of [employer] or Organizations: Yes    Attends Banker Meetings: More than 4 times per year    Marital Status: Widowed  Intimate Partner Violence: Not At Risk (06/13/2022)   Humiliation, Afraid, Rape, and Kick questionnaire    Fear of Current or Ex-Partner: No    Emotionally Abused: No    Physically Abused: No     Sexually Abused: No    Outpatient Medications Prior to Visit  Medication Sig Dispense Refill   ALPRAZolam (XANAX) 0.5 MG tablet Take 1 tablet (0.5 mg total) by mouth at bedtime as needed for anxiety. 30 tablet 5   Calcium Carbonate-Vitamin D 600-10 MG-MCG TABS Take 600 mg by mouth daily.     estradiol (ESTRACE) 1 MG tablet TAKE 1 TABLET EVERY DAY 90 tablet 3   ezetimibe (ZETIA) 10 MG tablet Take 1 tablet (10 mg total) by mouth daily. 90 tablet 0   finasteride (PROSCAR) 5 MG tablet Take 1 tablet (5 mg total) by mouth daily. 90 tablet 3   fish oil-omega-3 fatty acids 1000 MG capsule Take 1 g by mouth daily.     gabapentin (NEURONTIN) 300 MG  capsule Take 2 capsules (600 mg total) by mouth at bedtime. 180 capsule 3   Glucosamine-Chondroit-Vit C-Mn (GLUCOSAMINE 1500 COMPLEX PO) Take 2 tablets by mouth at bedtime.     hydrochlorothiazide (HYDRODIURIL) 25 MG tablet Take 1 tablet by mouth once daily 90 tablet 0   rivaroxaban (XARELTO) 20 MG TABS tablet TAKE 1 TABLET BY MOUTH ONCE DAILY WITH SUPPER 90 tablet 1   ustekinumab (STELARA) 45 MG/0.5ML injection Inject 45 mg into the skin every 8 (eight) weeks.     fluconazole (DIFLUCAN) 150 MG tablet Take 1 tablet (150 mg total) by mouth daily. 1 tablet 0   Vitamin D, Ergocalciferol, (DRISDOL) 1.25 MG (50000 UNIT) CAPS capsule Take 1 capsule (50,000 Units total) by mouth every 7 (seven) days. 12 capsule 1   No facility-administered medications prior to visit.    Allergies  Allergen Reactions   Mercaptopurine     Used to treat pt. Crohn's Disease.  Caused Pancreatis 1992. Other reaction(s): Other (See Comments) Caused Pancreatis 1992   Amitriptyline Other (See Comments)    No emotions  Felt poorly while taking   Colestipol     GI upset.    Crestor [Rosuvastatin Calcium]     Muscle pain   Metoprolol Other (See Comments)    Extreme fatigue    Trazodone And Nefazodone     Did not work for pt    Zocor [Simvastatin]     Cramps in legs   Morphine  And Codeine Itching   Penicillins Rash    Review of Systems  Constitutional:  Negative for chills, fatigue and fever.  HENT:  Negative for congestion, ear pain and sinus pain.   Respiratory:  Negative for cough and shortness of breath.   Cardiovascular:  Negative for chest pain.  Gastrointestinal:  Negative for abdominal pain, constipation, diarrhea, nausea and vomiting.  Musculoskeletal:  Negative for myalgias.  Skin:  Positive for color change (red) and rash.  Neurological:  Negative for headaches.       Objective:        08/07/2023   10:27 AM 07/21/2023    8:06 AM 06/26/2023    8:12 AM  Vitals with BMI  Height 5' 5.5" 5' 5.5" 5' 5.5"  Weight 184 lbs 6 oz 181 lbs 178 lbs  BMI 30.21 29.65 29.16  Systolic 110 132 562  Diastolic 78 64 72  Pulse 57 72 56    No data found.   Physical Exam Vitals and nursing note reviewed.  Constitutional:      Appearance: Normal appearance.  HENT:     Head: Normocephalic and atraumatic.  Cardiovascular:     Rate and Rhythm: Normal rate and regular rhythm.  Pulmonary:     Effort: Pulmonary effort is normal.     Breath sounds: Normal breath sounds.  Skin:    Comments: Shingles- clusters of vesicles on erythematous base noted on the right labia, right perineum, extending into right gluteal cleft  Neurological:     General: No focal deficit present.     Mental Status: She is alert.  Psychiatric:        Mood and Affect: Mood normal.     Health Maintenance Due  Topic Date Due   OPHTHALMOLOGY EXAM  02/24/2022    There are no preventive care reminders to display for this patient.   Lab Results  Component Value Date   TSH 2.500 10/10/2022   Lab Results  Component Value Date   WBC 6.1 07/21/2023   HGB 14.8  07/21/2023   HCT 46.1 07/21/2023   MCV 92 07/21/2023   PLT 220 07/21/2023   Lab Results  Component Value Date   NA 142 07/21/2023   K 4.1 07/21/2023   CO2 26 07/21/2023   GLUCOSE 92 07/21/2023   BUN 14 07/21/2023    CREATININE 0.77 07/21/2023   BILITOT 0.6 07/21/2023   ALKPHOS 57 07/21/2023   AST 18 07/21/2023   ALT 15 07/21/2023   PROT 7.0 07/21/2023   ALBUMIN 4.3 07/21/2023   CALCIUM 10.0 07/21/2023   ANIONGAP 10 11/01/2022   EGFR 89 07/21/2023   Lab Results  Component Value Date   CHOL 233 (H) 07/21/2023   Lab Results  Component Value Date   HDL 96 07/21/2023   Lab Results  Component Value Date   LDLCALC 110 (H) 07/21/2023   Lab Results  Component Value Date   TRIG 163 (H) 07/21/2023   Lab Results  Component Value Date   CHOLHDL 2.4 07/21/2023   Lab Results  Component Value Date   HGBA1C 5.7 (H) 07/21/2023       Assessment & Plan:  Varicella-zoster infection Assessment & Plan: Presents with a rash of dry vesicles on an erythematous base, primarily on the right labia and extending to the right buttock, consistent with herpes zoster. Initial symptoms included itching and burning in the genital area, progressing to pain and vesicle formation. Recently received the first dose of the shingles vaccine; however, the timing of symptom onset suggests the vaccine is not the cause.   Immunosuppression from Crohn's disease and Stelara treatment may have contributed to the development of shingles. Experiences significant pain, particularly in the genital area, and mild nausea. Explained that while the rash may resolve in a few days with treatment, the pain may persist longer. Discussed the potential for persistent pain post-rash resolution, though lifelong persistence is uncertain.   Valtrex is prescribed to reduce lesion spread and aid resolution. Advised that Valtrex may cause nausea and should be taken with food. Recommended over-the-counter options like Salonpas cream with lidocaine, calamine lotion, and ice packs for pain relief. - Prescribe Valtrex (valacyclovir) 1 tablet three times daily for 7 days. - Advise taking Valtrex with food to minimize nausea. - Recommend over-the-counter  Salonpas cream with lidocaine for pain relief. - Suggest using calamine lotion or ice packs for additional soothing. - Advise completing the shingles vaccination series as scheduled. - Instruct to seek early treatment in future episodes of shingles.  Crohn's Disease Currently on Stelara, an immunosuppressive medication, which may have contributed to the development of shingles. - Continue current treatment with Stelara.   Other orders -     valACYclovir HCl; Take 1 tablet (1,000 mg total) by mouth 3 (three) times daily for 7 days.  Dispense: 21 tablet; Refill: 1     Assessment and Plan       Meds ordered this encounter  Medications   valACYclovir (VALTREX) 1000 MG tablet    Sig: Take 1 tablet (1,000 mg total) by mouth 3 (three) times daily for 7 days.    Dispense:  21 tablet    Refill:  1    No orders of the defined types were placed in this encounter.    Follow-up: No follow-ups on file.  An After Visit Summary was printed and given to the patient.  Windell Moment, MD [name] [phone]

## 2023-08-07 NOTE — Telephone Encounter (Signed)
 Copied from CRM [id number]. Topic: Clinical - Red Word Triage >> Aug 07, 2023  7:32 AM Izetta Dakin wrote: [facility] that prompted transfer to Nurse Triage: Possible shingles on right side and genital area  Chief Complaint: rash, pain Symptoms: red rash w/blisters on right buttocks, right low back and right side of vagina Frequency: started Friday or Saturday Pertinent Negatives: Patient denies fever Disposition: [] ED /[] Urgent Care (no appt availability in office) / [x] Appointment(In office/virtual)/ []  [place] [facility]/ [] Home Care/ [] Refused Recommended Disposition /[] [facility]/ []  Follow-up with PCP Additional Notes: started out as itching in vaginal area. now burning and tingling and moderate pain in areas infected.  Pt states possible shingles.  Pt also c/o fatigue & body aches since this started. In office appt scheduled for today.  Reason for Disposition  Large or small blisters on skin (i.e., fluid filled bubbles or sacs)  Fever present > 3 days (72 hours)  Additional Information  Commented on: [1] SEVERE pain (e.g., excruciating, unable to do any normal activities) AND [2] not improved 2 hours after pain medicine    Pain related to rash and blisters  Answer Assessment - Initial Assessment Questions 1. APPEARANCE of RASH: "Describe the rash." (e.g., spots, blisters, raised areas, skin peeling, scaly)     Blisters, red 2. SIZE: "How big are the spots?" (e.g., tip of pen, eraser, coin; inches, centimeters)     N/a 3. LOCATION: "Where is the rash located?"     Right buttocks, right side of vagina 4. COLOR: "What color is the rash?" (Note: It is difficult to assess rash color in people with darker-colored skin. When this situation occurs, simply ask the caller to describe what they see.)     Rash, blisters, 5. ONSET: "When did the rash begin?"     Friday 6. FEVER: "Do you have a fever?" If Yes, ask: "What is your temperature, how was it measured, and when did it  start?"     no 7. ITCHING: "Does the rash itch?" If Yes, ask: "How bad is the itch?" (Scale 1-10; or mild, moderate, severe)     N/a 8. CAUSE: "What do you think is causing the rash?"     Possible shingles 9. MEDICINE FACTORS: "Have you started any new medicines within the last 2 weeks?" (e.g., antibiotics)      N/a 10. OTHER SYMPTOMS: "Do you have any other symptoms?" (e.g., dizziness, headache, sore throat, joint pain)       Fatigue, body aches, dizziness this weekend, pain, burning 11. PREGNANCY: "Is there any chance you are pregnant?" "When was your last menstrual period?"       N/a  Answer Assessment - Initial Assessment Questions 1. ONSET: "When did the muscle aches or body pains start?"     Saturday 2. LOCATION: "What part of your body is hurting?" (e.g., entire body, arms, legs)     rash w/blisters on right buttocks, right low back and right side of vagina  3. SEVERITY: "How bad is the pain?" (Scale 1-10; or mild, moderate, severe)   - MILD (1-3): doesn't interfere with normal activities    - MODERATE (4-7): interferes with normal activities or awakens from sleep    - SEVERE (8-10):  excruciating pain, unable to do any normal activities      moderate 4. CAUSE: "What do you think is causing the pains?"     Possible shingles 5. FEVER: "Have you been having fever?"     no 6. OTHER  SYMPTOMS: "Do you have any other symptoms?" (e.g., chest pain, weakness, rash, cold or flu symptoms, weight loss)     Pain, rash, burning, body aches 7. PREGNANCY: "Is there any chance you are pregnant?" "When was your last menstrual period?"     N/a 8. TRAVEL: "Have you traveled out of the country in the last month?" (e.g., travel history, exposures)     N/a  Protocols used: Rash or Redness - Widespread-A-AH, Muscle Aches and Body Pain-A-AH

## 2023-08-07 NOTE — Telephone Encounter (Signed)
 Scheduled for an appointment. Dr. Sedalia Muta

## 2023-08-07 NOTE — Patient Instructions (Signed)
 Sent VALTREX 1000 MG THREE TIMES DAILY FOR 7 DAYS to your pharmacy Could try ice, calamine lotion, or SALON PAS CREAM for pain relief. If this comes back, call at the first onset of symptoms, and we could send in the course of antiviral medication.

## 2023-08-07 NOTE — Assessment & Plan Note (Signed)
 Presents with a rash of dry vesicles on an erythematous base, primarily on the right labia and extending to the right buttock, consistent with herpes zoster. Initial symptoms included itching and burning in the genital area, progressing to pain and vesicle formation. Recently received the first dose of the shingles vaccine; however, the timing of symptom onset suggests the vaccine is not the cause.   Immunosuppression from Crohn's disease and Stelara treatment may have contributed to the development of shingles. Experiences significant pain, particularly in the genital area, and mild nausea. Explained that while the rash may resolve in a few days with treatment, the pain may persist longer. Discussed the potential for persistent pain post-rash resolution, though lifelong persistence is uncertain.   Valtrex is prescribed to reduce lesion spread and aid resolution. Advised that Valtrex may cause nausea and should be taken with food. Recommended over-the-counter options like Salonpas cream with lidocaine, calamine lotion, and ice packs for pain relief. - Prescribe Valtrex (valacyclovir) 1 tablet three times daily for 7 days. - Advise taking Valtrex with food to minimize nausea. - Recommend over-the-counter Salonpas cream with lidocaine for pain relief. - Suggest using calamine lotion or ice packs for additional soothing. - Advise completing the shingles vaccination series as scheduled. - Instruct to seek early treatment in future episodes of shingles.  Crohn's Disease Currently on Stelara, an immunosuppressive medication, which may have contributed to the development of shingles. - Continue current treatment with Stelara.

## 2023-08-30 ENCOUNTER — Other Ambulatory Visit: Payer: Self-pay | Admitting: Family Medicine

## 2023-08-30 DIAGNOSIS — K50919 Crohn's disease, unspecified, with unspecified complications: Secondary | ICD-10-CM | POA: Diagnosis not present

## 2023-08-30 DIAGNOSIS — R600 Localized edema: Secondary | ICD-10-CM

## 2023-09-02 ENCOUNTER — Ambulatory Visit: Payer: Self-pay

## 2023-09-02 NOTE — Telephone Encounter (Signed)
 Copied from CRM [id number]. Topic: Clinical - Red Word Triage >> Sep 02, 2023  4:17 PM Elle L wrote: Red Word that prompted transfer to Nurse Triage: The patient has shoulder pain that started 3 weeks and is worsening.   Chief Complaint: Shoulder pain  Symptoms: Left shoulder pain  Pertinent Negatives: Patient denies any other symptom at this time Disposition: [] ED /[] Urgent Care (no appt availability in office) / [x] Appointment(In office/virtual)/ []  [facility] [facility]/ [] Home Care/ [] Refused Recommended Disposition /[] [facility] [facility]/ []  Follow-up with PCP Additional Notes: Patient reports that she has had left shoulder pain for the last 3 weeks. She states her pain has been causing her difficulty lifting the arm all the way up and would like to be evaluated. She denies any other symptom at this time. Appointment made for the patient tomorrow for evaluation.     Reason for Disposition  [1] MODERATE pain (e.g., interferes with normal activities) AND [2] present > 3 days  Answer Assessment - Initial Assessment Questions 1. ONSET: "When did the pain start?"     3 weeks 2. LOCATION: "Where is the pain located?"     Left shoulder  3. PAIN: "How bad is the pain?" (Scale 1-10; or mild, moderate, severe)   - MILD (1-3): doesn't interfere with normal activities   - MODERATE (4-7): interferes with normal activities (e.g., work or school) or awakens from sleep   - SEVERE (8-10): excruciating pain, unable to do any normal activities, unable to move arm at all due to pain     6/10 4. WORK OR EXERCISE: "Has there been any recent work or exercise that involved this part of the body?"     No 5. CAUSE: "What do you think is causing the shoulder pain?"     Possibly arthritis  6. OTHER SYMPTOMS: "Do you have any other symptoms?" (e.g., neck pain, swelling, rash, fever, numbness, weakness)     No  Protocols used: Shoulder Pain-A-AH

## 2023-09-03 ENCOUNTER — Ambulatory Visit: Admitting: Family Medicine

## 2023-09-03 ENCOUNTER — Encounter: Payer: Self-pay | Admitting: Family Medicine

## 2023-09-03 VITALS — BP 130/72 | HR 65 | Temp 97.8°F | Ht 65.5 in | Wt 180.0 lb

## 2023-09-03 DIAGNOSIS — M25512 Pain in left shoulder: Secondary | ICD-10-CM | POA: Diagnosis not present

## 2023-09-03 MED ORDER — TRIAMCINOLONE ACETONIDE 40 MG/ML IJ SUSP: 40.0000 mg | Freq: Once | INTRAMUSCULAR | Status: AC

## 2023-09-03 NOTE — Patient Instructions (Signed)
 Recommend patient do exercises twice a day. Recommend continued Tylenol  use Recommend that she get left shoulder x-ray if does not improve within the next week.  Order is placed with the med center in [place]. May use Voltaren gel up to 4 times a day on area.

## 2023-09-03 NOTE — Progress Notes (Signed)
 Acute Office Visit  Subjective:    Patient ID: Brittany [name], female    DOB: 09-08-1965, 58 y.o.   MRN: [id number]  Chief Complaint  Patient presents with   Shoulder Pain    HPI: Patient is in today for left shoulder pain x 3 weeks. Pain comes and goes, decreased ROM, lifting or carrying worsens the pain, no known injury, pain radiates at times down her arm. Tylenol  helps some.  Past Medical History:  Diagnosis Date   BMI 26.0-26.9,adult 07/06/2019   Bradycardia 08/10/2014   Crohn's disease (HCC)    DDD (degenerative disc disease), lumbar 07/17/2017   Diabetes mellitus without complication (HCC)    Fatigue 08/10/2014   Hyperlipidemia    Hyperlipidemia associated with type 2 diabetes mellitus (HCC) 07/06/2019   Hypertension    Hypertension, essential    Mixed hyperlipidemia    Palpitations 07/06/2019   Paresthesia 07/06/2019    Past Surgical History:  Procedure Laterality Date   ABDOMINAL HYSTERECTOMY N/A 10/20/2012   Procedure: HYSTERECTOMY ABDOMINAL;  Surgeon: Deann Exon, MD;  Location: [facility];  Service: Gynecology;  Laterality: N/A;   ANAL FISSURE REPAIR  05/13/2000   APPENDECTOMY     ATRIAL FIBRILLATION ABLATION N/A 01/09/2023   Procedure: ATRIAL FIBRILLATION ABLATION;  Surgeon: Lei Pump, MD;  Location: [facility];  Service: Cardiovascular;  Laterality: N/A;   CARDIOVERSION N/A 07/03/2021   Procedure: CARDIOVERSION;  Surgeon: Wendie Hamburg, MD;  Location: [facility] ENDOSCOPY;  Service: Cardiovascular;  Laterality: N/A;   CATARACT EXTRACTION Bilateral    EYE SURGERY  05/31/2022   removed film   SALPINGOOPHORECTOMY Bilateral 10/20/2012   Procedure: SALPINGO OOPHORECTOMY;  Surgeon: Deann Exon, MD;  Location: [facility];  Service: Gynecology;  Laterality: Bilateral;   SMALL INTESTINE SURGERY     reconstruction   TONSILLECTOMY      Family History  Problem Relation Age of Onset   Heart Problems Mother    Diabetes Mother    Diabetes Father     Heart Problems Father    Cancer Maternal Grandmother        lung   Peptic Ulcer Disease Other     Social History   Socioeconomic History   Marital status: Widowed    Spouse name: Not on file   Number of children: 1   Years of education: Not on file   Highest education level: 11th grade  Occupational History   Not on file  Tobacco Use   Smoking status: Former    Current packs/day: 0.00    Types: Cigarettes    Quit date: 2002    Years since quitting: 23.3   Smokeless tobacco: Never   Tobacco comments:    Former smoker 02/05/22  Vaping Use   Vaping status: Never Used  Substance and Sexual Activity   Alcohol use: Yes    Alcohol/week: 1.0 standard drink of alcohol    Types: 1 Standard drinks or equivalent per week    Comment: socially   Drug use: No   Sexual activity: Yes    Partners: Male  Other Topics Concern   Not on file  Social History Narrative   wears sunscreen, brushes and flosses daily, see's dentist bi-annually, has smoke/carbon monoxide detectors, wears a seatbelt and practices gun safety   Social Drivers of Health   Financial Resource Strain: Patient Declined (06/19/2023)   Overall Financial Resource Strain (CARDIA)    Difficulty of Paying Living Expenses: Patient declined  Food Insecurity: Patient Declined (06/19/2023)  Hunger Vital Sign    Worried About Running Out of Food in the Last Year: Patient declined    Ran Out of Food in the Last Year: Patient declined  Transportation Needs: No Transportation Needs (06/19/2023)   PRAPARE - Administrator, Civil Service (Medical): No    Lack of Transportation (Non-Medical): No  Physical Activity: Unknown (06/19/2023)   Exercise Vital Sign    Days of Exercise per Week: 3 days    Minutes of Exercise per Session: Patient declined  Stress: No Stress Concern Present (06/19/2023)   [employer] of Occupational Health - Occupational Stress Questionnaire    Feeling of Stress : Only a little  Social  Connections: Moderately Integrated (06/19/2023)   Social Connection and Isolation Panel [NHANES]    Frequency of Communication with Friends and Family: More than three times a week    Frequency of Social Gatherings with Friends and Family: More than three times a week    Attends Religious Services: More than 4 times per year    Active Member of [employer] or Organizations: Yes    Attends Banker Meetings: More than 4 times per year    Marital Status: Widowed  Intimate Partner Violence: Not At Risk (09/03/2023)   Humiliation, Afraid, Rape, and Kick questionnaire    Fear of Current or Ex-Partner: No    Emotionally Abused: No    Physically Abused: No    Sexually Abused: No    Outpatient Medications Prior to Visit  Medication Sig Dispense Refill   ALPRAZolam  (XANAX ) 0.5 MG tablet Take 1 tablet (0.5 mg total) by mouth at bedtime as needed for anxiety. 30 tablet 5   Calcium Carbonate-Vitamin D  600-10 MG-MCG TABS Take 600 mg by mouth daily.     estradiol (ESTRACE) 1 MG tablet TAKE 1 TABLET EVERY DAY 90 tablet 3   ezetimibe  (ZETIA ) 10 MG tablet Take 1 tablet (10 mg total) by mouth daily. 90 tablet 0   finasteride  (PROSCAR ) 5 MG tablet Take 1 tablet (5 mg total) by mouth daily. 90 tablet 3   fish oil-omega-3 fatty acids 1000 MG capsule Take 1 g by mouth daily.     gabapentin  (NEURONTIN ) 300 MG capsule Take 2 capsules (600 mg total) by mouth at bedtime. 180 capsule 3   Glucosamine-Chondroit-Vit C-Mn (GLUCOSAMINE 1500 COMPLEX PO) Take 2 tablets by mouth at bedtime.     hydrochlorothiazide  (HYDRODIURIL ) 25 MG tablet Take 1 tablet by mouth once daily 90 tablet 0   rivaroxaban  (XARELTO ) 20 MG TABS tablet TAKE 1 TABLET BY MOUTH ONCE DAILY WITH SUPPER 90 tablet 1   ustekinumab  (STELARA ) 45 MG/0.5ML injection Inject 45 mg into the skin every 8 (eight) weeks.     No facility-administered medications prior to visit.    Allergies  Allergen Reactions   Mercaptopurine     Used to treat pt. Crohn's  Disease.  Caused Pancreatis 1992. Other reaction(s): Other (See Comments) Caused Pancreatis 1992   Amitriptyline Other (See Comments)    No emotions  Felt poorly while taking   Colestipol     GI upset.    Crestor [Rosuvastatin Calcium]     Muscle pain   Metoprolol  Other (See Comments)    Extreme fatigue    Trazodone And Nefazodone     Did not work for pt    Zocor [Simvastatin]     Cramps in legs   Morphine And Codeine Itching   Penicillins Rash    Review of Systems  Musculoskeletal:  Positive for arthralgias.       Left shoulder pain       Objective:        09/03/2023   10:33 AM 08/07/2023   10:27 AM 07/21/2023    8:06 AM  Vitals with BMI  Height 5' 5.5" 5' 5.5" 5' 5.5"  Weight 180 lbs 184 lbs 6 oz 181 lbs  BMI 29.49 30.21 29.65  Systolic 130 110 161  Diastolic 72 78 64  Pulse 65 57 72    No data found.   Physical Exam Vitals reviewed.  Constitutional:      Appearance: Normal appearance.  Musculoskeletal:        General: Tenderness present.     Comments: Decreased abduction of left shoulder. Limited internal and external rotation.  Positive empty can sign.   Neurological:     Mental Status: She is alert.    Joint Injection/Arthrocentesis  Date/Time: 09/03/2023 9:26 PM  Performed by: Mercy Stall, MD Authorized by: Mercy Stall, MD  Indications: pain  Body area: shoulder Joint: left shoulder Local anesthesia used: yes  Anesthesia: Local anesthesia used: yes Local Anesthetic: topical anesthetic  Sedation: Patient sedated: no  Needle size: 22 G Ultrasound guidance: no Approach: posterior Triamcinolone  amount: 40 mg Lidocaine  1% amount: 5 mL Patient tolerance: patient tolerated the procedure well with no immediate complications     Health Maintenance Due  Topic Date Due   OPHTHALMOLOGY EXAM  02/24/2022    There are no preventive care reminders to display for this patient.   Lab Results  Component Value Date   TSH 2.500 10/10/2022    Lab Results  Component Value Date   WBC 6.1 07/21/2023   HGB 14.8 07/21/2023   HCT 46.1 07/21/2023   MCV 92 07/21/2023   PLT 220 07/21/2023   Lab Results  Component Value Date   NA 142 07/21/2023   K 4.1 07/21/2023   CO2 26 07/21/2023   GLUCOSE 92 07/21/2023   BUN 14 07/21/2023   CREATININE 0.77 07/21/2023   BILITOT 0.6 07/21/2023   ALKPHOS 57 07/21/2023   AST 18 07/21/2023   ALT 15 07/21/2023   PROT 7.0 07/21/2023   ALBUMIN 4.3 07/21/2023   CALCIUM 10.0 07/21/2023   ANIONGAP 10 11/01/2022   EGFR 89 07/21/2023   Lab Results  Component Value Date   CHOL 233 (H) 07/21/2023   Lab Results  Component Value Date   HDL 96 07/21/2023   Lab Results  Component Value Date   LDLCALC 110 (H) 07/21/2023   Lab Results  Component Value Date   TRIG 163 (H) 07/21/2023   Lab Results  Component Value Date   CHOLHDL 2.4 07/21/2023   Lab Results  Component Value Date   HGBA1C 5.7 (H) 07/21/2023       Assessment & Plan:  Acute pain of left shoulder Assessment & Plan: Completed arthrocentesis. Recommend patient do exercises twice a day. Recommend continued Tylenol  use Recommend that she get left shoulder x-ray if does not improve within the next week.  Order is placed with the med center in [place]. May use Voltaren gel up to 4 times a day on area.  Orders: -     Triamcinolone  Acetonide -     DG Shoulder Left; Future -     Arthrocentesis     Meds ordered this encounter  Medications   triamcinolone  acetonide (KENALOG -40) injection 40 mg    Orders Placed This Encounter  Procedures   Joint Injection/Arthrocentesis   DG Shoulder  Left     Follow-up: Return if symptoms worsen or fail to improve.  An After Visit Summary was printed and given to the patient.  Mercy Stall, MD [facility] [phone]

## 2023-09-03 NOTE — Assessment & Plan Note (Signed)
 Completed arthrocentesis. Recommend patient do exercises twice a day. Recommend continued Tylenol  use Recommend that she get left shoulder x-ray if does not improve within the next week.  Order is placed with the med center in [place]. May use Voltaren gel up to 4 times a day on area.

## 2023-09-04 ENCOUNTER — Ambulatory Visit (HOSPITAL_BASED_OUTPATIENT_CLINIC_OR_DEPARTMENT_OTHER)
Admission: RE | Admit: 2023-09-04 | Discharge: 2023-09-04 | Disposition: A | Discharge status: Home / Self Care | Source: Ambulatory Visit | Attending: Family Medicine | Admitting: Family Medicine

## 2023-09-04 DIAGNOSIS — M25512 Pain in left shoulder: Secondary | ICD-10-CM

## 2023-09-04 DIAGNOSIS — I700 Atherosclerosis of aorta: Secondary | ICD-10-CM | POA: Diagnosis not present

## 2023-09-04 DIAGNOSIS — M19012 Primary osteoarthritis, left shoulder: Secondary | ICD-10-CM | POA: Diagnosis not present

## 2023-09-11 ENCOUNTER — Encounter: Payer: Self-pay | Admitting: Family Medicine

## 2023-09-11 ENCOUNTER — Other Ambulatory Visit: Payer: Self-pay | Admitting: Family Medicine

## 2023-09-13 ENCOUNTER — Other Ambulatory Visit: Payer: Self-pay

## 2023-09-14 MED ORDER — ALPRAZOLAM 0.5 MG PO TABS: 0.5000 mg | ORAL_TABLET | Freq: Every evening | ORAL | 5 refills | Status: DC | PRN

## 2023-10-04 ENCOUNTER — Other Ambulatory Visit: Payer: Self-pay | Admitting: Cardiology

## 2023-10-04 DIAGNOSIS — I4819 Other persistent atrial fibrillation: Secondary | ICD-10-CM

## 2023-10-07 NOTE — Telephone Encounter (Signed)
 Pt last saw Michaelle Adolphus, [place] on 05/02/23, last labs 07/21/23 Creat 0.77, age 58, weight 81.6kg, CrCl 100.09, based on CrCl pt is on appropriate dosage of Xarelto  20mg  every day for afib.  Will refill rx.

## 2023-10-08 ENCOUNTER — Ambulatory Visit
Admit: 2023-10-08 | Discharge: 2023-10-09 | Payer: Medicare (Managed Care) | Attending: Gastroenterology | Primary: Gastroenterology

## 2023-10-08 DIAGNOSIS — Z980 Intestinal bypass and anastomosis status: Secondary | ICD-10-CM | POA: Diagnosis not present

## 2023-10-08 DIAGNOSIS — K50812 Crohn's disease of both small and large intestine with intestinal obstruction: Secondary | ICD-10-CM | POA: Diagnosis not present

## 2023-10-08 DIAGNOSIS — K9089 Other intestinal malabsorption: Secondary | ICD-10-CM | POA: Diagnosis not present

## 2023-10-08 MED ORDER — DIPHENOXYLATE-ATROPINE 2.5 MG-0.025 MG TABLET
ORAL_TABLET | Freq: Four times a day (QID) | ORAL | 3 refills | 8.00000 days | Status: CP | PRN
Start: 2023-10-08 — End: ?

## 2023-10-15 ENCOUNTER — Other Ambulatory Visit: Payer: Self-pay | Admitting: Family Medicine

## 2023-10-15 DIAGNOSIS — E782 Mixed hyperlipidemia: Secondary | ICD-10-CM

## 2023-10-16 ENCOUNTER — Other Ambulatory Visit (HOSPITAL_BASED_OUTPATIENT_CLINIC_OR_DEPARTMENT_OTHER): Payer: Self-pay | Admitting: Gastroenterology

## 2023-10-16 DIAGNOSIS — K5090 Crohn's disease, unspecified, without complications: Secondary | ICD-10-CM

## 2023-10-23 DIAGNOSIS — K50812 Crohn's disease of both small and large intestine with intestinal obstruction: Principal | ICD-10-CM

## 2023-10-23 MED ORDER — WEZLANA 90 MG/ML SUBCUTANEOUS SYRINGE
SUBCUTANEOUS | 2 refills | 0.00000 days | Status: CP
Start: 2023-10-23 — End: ?

## 2023-10-27 DIAGNOSIS — K50919 Crohn's disease, unspecified, with unspecified complications: Secondary | ICD-10-CM | POA: Diagnosis not present

## 2023-11-06 ENCOUNTER — Ambulatory Visit (HOSPITAL_BASED_OUTPATIENT_CLINIC_OR_DEPARTMENT_OTHER)
Admission: RE | Admit: 2023-11-06 | Discharge: 2023-11-06 | Disposition: A | Discharge status: Home / Self Care | Source: Ambulatory Visit | Attending: Gastroenterology | Admitting: Gastroenterology

## 2023-11-06 ENCOUNTER — Other Ambulatory Visit (HOSPITAL_BASED_OUTPATIENT_CLINIC_OR_DEPARTMENT_OTHER): Payer: Self-pay | Admitting: Gastroenterology

## 2023-11-06 DIAGNOSIS — K5730 Diverticulosis of large intestine without perforation or abscess without bleeding: Secondary | ICD-10-CM | POA: Diagnosis not present

## 2023-11-06 DIAGNOSIS — K5090 Crohn's disease, unspecified, without complications: Secondary | ICD-10-CM | POA: Diagnosis not present

## 2023-11-06 DIAGNOSIS — M0760 Enteropathic arthropathies, unspecified site: Secondary | ICD-10-CM | POA: Diagnosis not present

## 2023-11-06 DIAGNOSIS — K760 Fatty (change of) liver, not elsewhere classified: Secondary | ICD-10-CM | POA: Diagnosis not present

## 2023-11-06 LAB — POCT I-STAT CREATININE: Creatinine, Ser: 0.9 mg/dL (ref 0.44–1.00)

## 2023-11-06 MED ORDER — IOHEXOL 300 MG/ML  SOLN
100.0000 mL | Freq: Once | INTRAMUSCULAR | Status: AC | PRN
  Administered 2023-11-06: 100 mL via INTRAVENOUS

## 2023-11-11 DIAGNOSIS — Z1231 Encounter for screening mammogram for malignant neoplasm of breast: Secondary | ICD-10-CM | POA: Diagnosis not present

## 2023-11-11 LAB — HM MAMMOGRAPHY

## 2023-11-24 ENCOUNTER — Ambulatory Visit (INDEPENDENT_AMBULATORY_CARE_PROVIDER_SITE_OTHER): Admitting: Family Medicine

## 2023-11-24 ENCOUNTER — Encounter: Payer: Self-pay | Admitting: Family Medicine

## 2023-11-24 VITALS — BP 128/72 | HR 60 | Temp 97.8°F | Ht 65.5 in | Wt 177.0 lb

## 2023-11-24 DIAGNOSIS — K5080 Crohn's disease of both small and large intestine without complications: Secondary | ICD-10-CM | POA: Diagnosis not present

## 2023-11-24 DIAGNOSIS — I482 Chronic atrial fibrillation, unspecified: Secondary | ICD-10-CM | POA: Diagnosis not present

## 2023-11-24 DIAGNOSIS — E559 Vitamin D deficiency, unspecified: Secondary | ICD-10-CM

## 2023-11-24 DIAGNOSIS — E785 Hyperlipidemia, unspecified: Secondary | ICD-10-CM | POA: Diagnosis not present

## 2023-11-24 DIAGNOSIS — T466X5A Adverse effect of antihyperlipidemic and antiarteriosclerotic drugs, initial encounter: Secondary | ICD-10-CM

## 2023-11-24 DIAGNOSIS — M791 Myalgia, unspecified site: Secondary | ICD-10-CM

## 2023-11-24 DIAGNOSIS — R001 Bradycardia, unspecified: Secondary | ICD-10-CM

## 2023-11-24 DIAGNOSIS — E782 Mixed hyperlipidemia: Secondary | ICD-10-CM | POA: Diagnosis not present

## 2023-11-24 DIAGNOSIS — E1169 Type 2 diabetes mellitus with other specified complication: Secondary | ICD-10-CM | POA: Diagnosis not present

## 2023-11-24 NOTE — Assessment & Plan Note (Signed)
 Intolerant to statins.

## 2023-11-24 NOTE — Assessment & Plan Note (Signed)
 Management per specialist.  Continue stelara .

## 2023-11-24 NOTE — Progress Notes (Signed)
 Subjective:  Patient ID: Brittany [name], female    DOB: [birth year]  Age: 58 y.o. MRN: [id number]  Chief Complaint  Patient presents with   Medical Management of Chronic Issues    HPI:  Discussed the use of AI scribe software for clinical note transcription with the patient, who gave verbal consent to proceed.  History of Present Illness   The patient, with atrial fibrillation and Crohn's disease, presents with concerns about delayed communication regarding a recent CT scan and ongoing palpitations.  Palpitations and tachycardia - History of atrial fibrillation, status post ablation in August 2024 - Currently on Xarelto  for anticoagulation - Ongoing episodes of palpitations with heart rates up to 145 bpm, confirmed by wearable device - No medication prescribed for rate or rhythm control of these episodes  Abdominal pain and gastrointestinal symptoms - History of Crohn's disease with chronic perianal scarring and rectovaginal fissure - Abdominal pain, particularly on the right side, prompted recent CT scan of abdomen and pelvis approximately three weeks ago - CT scan results accessed via My Chart but not fully understood; no communication from ordering provider regarding results - Diagnosis of fatty liver despite significant weight loss  Biologic therapy for crohn's disease - On Stelara  for Crohn's disease, recently switched to a generic version due to insurance coverage - Only one dose of the generic received to date - Monitoring for changes in disease activity or side effects since the switch  Alopecia and hair fragility - Experiencing hair thinning and breakage, she attributes to menopause and medication - Tried topical treatments including castor oil, frankincense, and coconut oil without significant improvement - Considering biotin supplementation - On finasteride  5 mg daily which has helped.  - Unable to secure a timely appointment with a specialist for further  evaluation     Hyperlipidemia: Takes Zetia  10 mg daily, Fish oil 1000 mg  in the morning and at bedtime.    Insomnia: xanax  0.5 mg at hs as needed.      Hypertension: hydrochlorothiazide  25 mg daily.   Diabetes: A1C has been good for some time.      11/24/2023    8:16 AM 08/07/2023   10:29 AM 10/10/2022    9:48 AM 08/13/2022    4:03 PM 06/13/2022    1:37 PM  Depression screen PHQ 2/9  Decreased Interest 0 0 0 0 0  Down, Depressed, Hopeless 0 0 0 0 0  PHQ - 2 Score 0 0 0 0 0  Altered sleeping 1  1    Tired, decreased energy 1  3    Change in appetite 0  0    Feeling bad or failure about yourself  0  0    Trouble concentrating 0  0    Moving slowly or fidgety/restless 0  0    Suicidal thoughts 0  0    PHQ-9 Score 2  4    Difficult doing work/chores Not difficult at all  Not difficult at all          11/24/2023    8:15 AM  Fall Risk   Falls in the past year? 0  Number falls in past yr: 0  Injury with Fall? 0  Risk for fall due to : No Fall Risks  Follow up Falls evaluation completed    Patient Care Team: Sherre Clapper, MD as PCP - General (Family Medicine) Inocencio Soyla Lunger, MD as PCP - Electrophysiology (Cardiology) Nyle Rankin POUR, [facility] (Inactive) (Pharmacist) Ablott, [place] (Optometry) Okey Leader,  MD as Consulting Physician (Obstetrics and Gynecology)   Review of Systems  Constitutional:  Negative for chills, fatigue and fever.  HENT:  Negative for congestion, ear pain, rhinorrhea and sore throat.   Respiratory:  Negative for cough and shortness of breath.   Cardiovascular:  Positive for palpitations. Negative for chest pain.  Gastrointestinal:  Negative for abdominal pain, constipation, diarrhea, nausea and vomiting.  Genitourinary:  Negative for dysuria and urgency.  Musculoskeletal:  Negative for back pain and myalgias.  Neurological:  Negative for dizziness, weakness, light-headedness and headaches.  Psychiatric/Behavioral:  Negative for dysphoric mood.  The patient is not nervous/anxious.     Current Outpatient Medications on File Prior to Visit  Medication Sig Dispense Refill   ALPRAZolam  (XANAX ) 0.5 MG tablet Take 1 tablet (0.5 mg total) by mouth at bedtime as needed for anxiety. 30 tablet 5   Calcium Carbonate-Vitamin D  600-10 MG-MCG TABS Take 600 mg by mouth daily.     estradiol (ESTRACE) 1 MG tablet TAKE 1 TABLET EVERY DAY 90 tablet 3   ezetimibe  (ZETIA ) 10 MG tablet Take 1 tablet by mouth once daily 90 tablet 0   finasteride  (PROSCAR ) 5 MG tablet Take 1 tablet (5 mg total) by mouth daily. 90 tablet 3   fish oil-omega-3 fatty acids 1000 MG capsule Take 1 g by mouth daily.     gabapentin  (NEURONTIN ) 300 MG capsule Take 2 capsules (600 mg total) by mouth at bedtime. 180 capsule 3   Glucosamine-Chondroit-Vit C-Mn (GLUCOSAMINE 1500 COMPLEX PO) Take 2 tablets by mouth at bedtime.     hydrochlorothiazide  (HYDRODIURIL ) 25 MG tablet Take 1 tablet by mouth once daily 90 tablet 0   rivaroxaban  (XARELTO ) 20 MG TABS tablet TAKE 1 TABLET BY MOUTH ONCE DAILY WITH SUPPER 90 tablet 1   ustekinumab  (STELARA ) 45 MG/0.5ML injection Inject 45 mg into the skin every 8 (eight) weeks.     Current Facility-Administered Medications on File Prior to Visit  Medication Dose Route Frequency Provider Last Rate Last Admin   triamcinolone  acetonide (KENALOG -40) injection 40 mg  40 mg Intra-articular Once        Past Medical History:  Diagnosis Date   BMI 26.0-26.9,adult 07/06/2019   Bradycardia 08/10/2014   Crohn's disease (HCC)    DDD (degenerative disc disease), lumbar 07/17/2017   Diabetes mellitus without complication (HCC)    Fatigue 08/10/2014   Hyperlipidemia    Hyperlipidemia associated with type 2 diabetes mellitus (HCC) 07/06/2019   Hypertension    Hypertension, essential    Mixed hyperlipidemia    Palpitations 07/06/2019   Paresthesia 07/06/2019   Past Surgical History:  Procedure Laterality Date   ABDOMINAL HYSTERECTOMY N/A 10/20/2012   Procedure:  HYSTERECTOMY ABDOMINAL;  Surgeon: Peggye Gull, MD;  Location: [facility];  Service: Gynecology;  Laterality: N/A;   ANAL FISSURE REPAIR  05/13/2000   APPENDECTOMY     ATRIAL FIBRILLATION ABLATION N/A 01/09/2023   Procedure: ATRIAL FIBRILLATION ABLATION;  Surgeon: Inocencio Soyla Lunger, MD;  Location: [facility];  Service: Cardiovascular;  Laterality: N/A;   CARDIOVERSION N/A 07/03/2021   Procedure: CARDIOVERSION;  Surgeon: Kate Lonni CROME, MD;  Location: [facility] ENDOSCOPY;  Service: Cardiovascular;  Laterality: N/A;   CATARACT EXTRACTION Bilateral    EYE SURGERY  05/31/2022   removed film   SALPINGOOPHORECTOMY Bilateral 10/20/2012   Procedure: SALPINGO OOPHORECTOMY;  Surgeon: Peggye Gull, MD;  Location: [facility];  Service: Gynecology;  Laterality: Bilateral;   SMALL INTESTINE SURGERY     reconstruction  TONSILLECTOMY      Family History  Problem Relation Age of Onset   Heart Problems Mother    Diabetes Mother    Diabetes Father    Heart Problems Father    Cancer Maternal Grandmother        lung   Peptic Ulcer Disease Other    Social History   Socioeconomic History   Marital status: Widowed    Spouse name: Not on file   Number of children: 1   Years of education: Not on file   Highest education level: 11th grade  Occupational History   Not on file  Tobacco Use   Smoking status: Former    Current packs/day: 0.00    Types: Cigarettes    Quit date: 2002    Years since quitting: 23.5   Smokeless tobacco: Never   Tobacco comments:    Former smoker 02/05/22  Vaping Use   Vaping status: Never Used  Substance and Sexual Activity   Alcohol use: Yes    Alcohol/week: 1.0 standard drink of alcohol    Types: 1 Standard drinks or equivalent per week    Comment: socially   Drug use: No   Sexual activity: Yes    Partners: Male  Other Topics Concern   Not on file  Social History Narrative   wears sunscreen, brushes and flosses daily, see's dentist bi-annually, has  smoke/carbon monoxide detectors, wears a seatbelt and practices gun safety   Social Drivers of Health   Financial Resource Strain: Low Risk  (11/20/2023)   Overall Financial Resource Strain (CARDIA)    Difficulty of Paying Living Expenses: Not very hard  Food Insecurity: No Food Insecurity (11/24/2023)   Hunger Vital Sign    Worried About Running Out of Food in the Last Year: Never true    Ran Out of Food in the Last Year: Never true  Transportation Needs: No Transportation Needs (11/20/2023)   PRAPARE - Administrator, Civil Service (Medical): No    Lack of Transportation (Non-Medical): No  Physical Activity: Insufficiently Active (11/20/2023)   Exercise Vital Sign    Days of Exercise per Week: 3 days    Minutes of Exercise per Session: 30 min  Stress: No Stress Concern Present (06/19/2023)   [employer] of Occupational Health - Occupational Stress Questionnaire    Feeling of Stress : Only a little  Social Connections: Moderately Integrated (11/20/2023)   Social Connection and Isolation Panel    Frequency of Communication with Friends and Family: More than three times a week    Frequency of Social Gatherings with Friends and Family: More than three times a week    Attends Religious Services: More than 4 times per year    Active Member of [employer] or Organizations: Yes    Attends Banker Meetings: More than 4 times per year    Marital Status: Widowed    Objective:  BP 128/72   Pulse 60   Temp 97.8 F (36.6 C)   Ht 5' 5.5 (1.664 m)   Wt 177 lb (80.3 kg)   LMP 09/23/2012   SpO2 98%   BMI 29.01 kg/m      11/24/2023    8:19 AM 09/03/2023   10:33 AM 08/07/2023   10:27 AM  BP/Weight  Systolic BP 128 130 110  Diastolic BP 72 72 78  Wt. (Lbs) 177 180 184.4  BMI 29.01 kg/m2 29.5 kg/m2 30.22 kg/m2    Physical Exam Vitals reviewed.  Constitutional:  Appearance: Normal appearance. She is normal weight.  Neck:     Vascular: No carotid bruit.   Cardiovascular:     Rate and Rhythm: Normal rate and regular rhythm.     Heart sounds: Normal heart sounds.  Pulmonary:     Effort: Pulmonary effort is normal. No respiratory distress.     Breath sounds: Normal breath sounds.  Abdominal:     General: Abdomen is flat. Bowel sounds are normal.     Palpations: Abdomen is soft.     Tenderness: There is no abdominal tenderness.  Neurological:     Mental Status: She is alert and oriented to person, place, and time.  Psychiatric:        Mood and Affect: Mood normal.        Behavior: Behavior normal.         Lab Results  Component Value Date   WBC 6.1 07/21/2023   HGB 14.8 07/21/2023   HCT 46.1 07/21/2023   PLT 220 07/21/2023   GLUCOSE 92 07/21/2023   CHOL 233 (H) 07/21/2023   TRIG 163 (H) 07/21/2023   HDL 96 07/21/2023   LDLCALC 110 (H) 07/21/2023   ALT 15 07/21/2023   AST 18 07/21/2023   NA 142 07/21/2023   K 4.1 07/21/2023   CL 100 07/21/2023   CREATININE 0.90 11/06/2023   BUN 14 07/21/2023   CO2 26 07/21/2023   TSH 2.500 10/10/2022   INR 0.86 10/20/2012   HGBA1C 5.7 (H) 07/21/2023   MICROALBUR 30 08/25/2020      Assessment & Plan:  Chronic atrial fibrillation [facility]) Assessment & Plan: Atrial fibrillation with prior ablation. On Xarelto . Palpitations and tachycardia up to 145 bpm, resolving with relaxation. No cardiology follow-up for over a year. - Order EKG. Sinus bradycardia.  - I contacted Dr. Inocencio regarding palpitations and scheduling issues. His nurse to call her for an earlier appointment..  Orders: -     EKG 12-Lead  Bradycardia Assessment & Plan: Heart rate mid 40s. Needs to follow up with cardiology.   Orders: -     TSH  Mixed hyperlipidemia -     Lipid panel  Crohn's disease of both small and large intestine without complication (HCC) Assessment & Plan: Management per specialist.  Continue stelara .   Hyperlipidemia associated with type 2 diabetes mellitus (HCC) Assessment &  Plan: Diabetes and hyperlipidemia control: excellent Medicines: zetia  and fish oil Continue to work on eating a healthy diet and exercise.  Checked labs.  Orders: -     CBC with Differential/Platelet -     Comprehensive metabolic panel with GFR -     Hemoglobin A1c -     Microalbumin / creatinine urine ratio  Myalgia due to statin Assessment & Plan: Intolerant to statins.   Mild vitamin D  deficiency -     VITAMIN D  25 Hydroxy (Vit-D Deficiency, Fractures)     No orders of the defined types were placed in this encounter.   Orders Placed This Encounter  Procedures   CBC with Differential/Platelet   Comprehensive metabolic panel with GFR   Hemoglobin A1c   Lipid panel   TSH   Microalbumin / creatinine urine ratio   VITAMIN D  25 Hydroxy (Vit-D Deficiency, Fractures)   EKG 12-Lead     Follow-up: Return in about 3 months (around 02/24/2024) for chronic follow up.   I,Katherina A Bramblett,acting as a scribe for Abigail Free, MD.,have documented all relevant documentation on the behalf of Abigail Free, MD,as directed by  Abigail Free, MD while in the presence of Abigail Free, MD.   An After Visit Summary was printed and given to the patient.  I attest that I have reviewed this visit and agree with the plan scribed by my staff.   Abigail Free, MD [name] [phone]

## 2023-11-24 NOTE — Assessment & Plan Note (Signed)
 Atrial fibrillation with prior ablation. On Xarelto . Palpitations and tachycardia up to 145 bpm, resolving with relaxation. No cardiology follow-up for over a year. - Order EKG. Sinus bradycardia.  - I contacted Dr. Inocencio regarding palpitations and scheduling issues. His nurse to call her for an earlier appointment.Brittany [name]

## 2023-11-24 NOTE — Assessment & Plan Note (Signed)
 Heart rate mid 40s. Needs to follow up with cardiology.

## 2023-11-24 NOTE — Assessment & Plan Note (Addendum)
 Diabetes and hyperlipidemia control: excellent Medicines: zetia  and fish oil Continue to work on eating a healthy diet and exercise.  Checked labs.

## 2023-11-25 ENCOUNTER — Ambulatory Visit: Payer: Self-pay | Admitting: Family Medicine

## 2023-11-25 LAB — CBC WITH DIFFERENTIAL/PLATELET
Basophils Absolute: 0.1 x10E3/uL (ref 0.0–0.2)
Basos: 1 %
EOS (ABSOLUTE): 0.1 x10E3/uL (ref 0.0–0.4)
Eos: 1 %
Hematocrit: 45.7 % (ref 34.0–46.6)
Hemoglobin: 14.6 g/dL (ref 11.1–15.9)
Immature Grans (Abs): 0 x10E3/uL (ref 0.0–0.1)
Immature Granulocytes: 0 %
Lymphocytes Absolute: 2.2 x10E3/uL (ref 0.7–3.1)
Lymphs: 33 %
MCH: 30.2 pg (ref 26.6–33.0)
MCHC: 31.9 g/dL (ref 31.5–35.7)
MCV: 94 fL (ref 79–97)
Monocytes Absolute: 0.5 x10E3/uL (ref 0.1–0.9)
Monocytes: 7 %
Neutrophils Absolute: 3.8 x10E3/uL (ref 1.4–7.0)
Neutrophils: 58 %
Platelets: 229 x10E3/uL (ref 150–450)
RBC: 4.84 x10E6/uL (ref 3.77–5.28)
RDW: 13.1 % (ref 11.7–15.4)
WBC: 6.6 x10E3/uL (ref 3.4–10.8)

## 2023-11-25 LAB — COMPREHENSIVE METABOLIC PANEL WITH GFR
ALT: 20 IU/L (ref 0–32)
AST: 20 IU/L (ref 0–40)
Albumin: 4.4 g/dL (ref 3.8–4.9)
Alkaline Phosphatase: 54 IU/L (ref 44–121)
BUN/Creatinine Ratio: 17 (ref 12–28)
BUN: 14 mg/dL (ref 8–27)
Bilirubin Total: 0.6 mg/dL (ref 0.0–1.2)
CO2: 26 mmol/L (ref 20–29)
Calcium: 10 mg/dL (ref 8.7–10.3)
Chloride: 101 mmol/L (ref 96–106)
Creatinine, Ser: 0.81 mg/dL (ref 0.57–1.00)
Globulin, Total: 2.5 g/dL (ref 1.5–4.5)
Glucose: 101 mg/dL — ABNORMAL HIGH (ref 70–99)
Potassium: 4.3 mmol/L (ref 3.5–5.2)
Sodium: 142 mmol/L (ref 134–144)
Total Protein: 6.9 g/dL (ref 6.0–8.5)
eGFR: 83 mL/min/1.73 (ref >59)

## 2023-11-25 LAB — HEMOGLOBIN A1C
Est. average glucose Bld gHb Est-mCnc: 111 mg/dL
Hgb A1c MFr Bld: 5.5 % (ref 4.8–5.6)

## 2023-11-25 LAB — MICROALBUMIN / CREATININE URINE RATIO
Creatinine, Urine: 132.1 mg/dL
Microalb/Creat Ratio: 4 mg/g{creat} (ref 0–29)
Microalbumin, Urine: 5.1 ug/mL

## 2023-11-25 LAB — LIPID PANEL
Chol/HDL Ratio: 2.3 ratio (ref 0.0–4.4)
Cholesterol, Total: 234 mg/dL — ABNORMAL HIGH (ref 100–199)
HDL: 102 mg/dL (ref >39)
LDL Chol Calc (NIH): 111 mg/dL — ABNORMAL HIGH (ref 0–99)
Triglycerides: 123 mg/dL (ref 0–149)
VLDL Cholesterol Cal: 21 mg/dL (ref 5–40)

## 2023-11-25 LAB — TSH: TSH: 2.68 u[IU]/mL (ref 0.450–4.500)

## 2023-11-25 LAB — VITAMIN D 25 HYDROXY (VIT D DEFICIENCY, FRACTURES): Vit D, 25-Hydroxy: 37.1 ng/mL (ref 30.0–100.0)

## 2023-12-10 ENCOUNTER — Ambulatory Visit (HOSPITAL_COMMUNITY)
Admission: RE | Admit: 2023-12-10 | Discharge: 2023-12-10 | Disposition: A | Discharge status: Home / Self Care | Source: Ambulatory Visit | Attending: Internal Medicine | Admitting: Internal Medicine

## 2023-12-10 VITALS — BP 152/78 | HR 51 | Ht 65.5 in | Wt 178.6 lb

## 2023-12-10 DIAGNOSIS — I4891 Unspecified atrial fibrillation: Secondary | ICD-10-CM

## 2023-12-10 DIAGNOSIS — D6869 Other thrombophilia: Secondary | ICD-10-CM | POA: Diagnosis not present

## 2023-12-10 DIAGNOSIS — I4819 Other persistent atrial fibrillation: Secondary | ICD-10-CM

## 2023-12-10 NOTE — Progress Notes (Signed)
 Primary Care Physician: Sherre Clapper, MD Primary Cardiologist: Dr Shlomo (remotely) Primary Electrophysiologist: Dr Inocencio Referring Physician: Jol[facility]   Rock Crawford Baller is a 58 y.o. female with a history of DM, HTN, HLD, Crohn's disease, atrial fibrillation who presents for follow up in the [facility].  She did present to the emergency room 04/01/2021 with chest pain and palpitations.  She was found to be in atrial fibrillation at that time.  She felt that she went into atrial fibrillation 11/18, after her mother passed away. She underwent DCCV on 07/03/21. Patient is on Xarelto  for a CHADS2VASC score of 3. She presented to the ED 01/30/22 with palpitations. Her smart watch alerted her that she was in afib. She underwent repeat DCCV at that time. She is s/p Afib ablation on 01/09/23 by Dr. Inocencio. She wears an Scientist, physiological for monitoring rhythm at home.   On follow up 12/10/23, patient is currently in NSR. She has overall low Afib burden; one episode lasted for 30 minutes with HR 140s a few months ago. No missed doses of Xarelto .   Today, she denies symptoms of orthopnea, PND, lower extremity edema, dizziness, presyncope, syncope, snoring, daytime somnolence, bleeding, or neurologic sequela. The patient is tolerating medications without difficulties and is otherwise without complaint today.    Atrial Fibrillation Risk Factors:  she does not have symptoms or diagnosis of sleep apnea. she does not have a history of rheumatic fever. she does not have a history of alcohol use. The patient does have a history of early familial atrial fibrillation or other arrhythmias. Mother and father had afib.   she has a BMI of Body mass index is 29.27 kg/m.SABRA Filed Weights   12/10/23 1351  Weight: 81 kg      Family History  Problem Relation Age of Onset   Heart Problems Mother    Diabetes Mother    Diabetes Father    Heart Problems Father    Cancer Maternal  Grandmother        lung   Peptic Ulcer Disease Other      Atrial Fibrillation Management history:  Previous antiarrhythmic drugs: none Previous cardioversions: 07/03/21, 01/30/22 Previous ablations: 01/09/23 CHADS2VASC score: 3 Anticoagulation history: Xarelto     Past Medical History:  Diagnosis Date   BMI 26.0-26.9,adult 07/06/2019   Bradycardia 08/10/2014   Crohn's disease (HCC)    DDD (degenerative disc disease), lumbar 07/17/2017   Diabetes mellitus without complication (HCC)    Fatigue 08/10/2014   Hyperlipidemia    Hyperlipidemia associated with type 2 diabetes mellitus (HCC) 07/06/2019   Hypertension    Hypertension, essential    Mixed hyperlipidemia    Palpitations 07/06/2019   Paresthesia 07/06/2019   Past Surgical History:  Procedure Laterality Date   ABDOMINAL HYSTERECTOMY N/A 10/20/2012   Procedure: HYSTERECTOMY ABDOMINAL;  Surgeon: Peggye Gull, MD;  Location: [facility];  Service: Gynecology;  Laterality: N/A;   ANAL FISSURE REPAIR  05/13/2000   APPENDECTOMY     ATRIAL FIBRILLATION ABLATION N/A 01/09/2023   Procedure: ATRIAL FIBRILLATION ABLATION;  Surgeon: Inocencio Soyla Lunger, MD;  Location: [facility];  Service: Cardiovascular;  Laterality: N/A;   CARDIOVERSION N/A 07/03/2021   Procedure: CARDIOVERSION;  Surgeon: Kate Lonni CROME, MD;  Location: [facility] ENDOSCOPY;  Service: Cardiovascular;  Laterality: N/A;   CATARACT EXTRACTION Bilateral    EYE SURGERY  05/31/2022   removed film   SALPINGOOPHORECTOMY Bilateral 10/20/2012   Procedure: SALPINGO OOPHORECTOMY;  Surgeon: Peggye Gull,  MD;  Location: [facility];  Service: Gynecology;  Laterality: Bilateral;   SMALL INTESTINE SURGERY     reconstruction   TONSILLECTOMY      Current Outpatient Medications  Medication Sig Dispense Refill   ALPRAZolam  (XANAX ) 0.5 MG tablet Take 1 tablet (0.5 mg total) by mouth at bedtime as needed for anxiety. 30 tablet 5   Calcium Carbonate-Vitamin D  600-10 MG-MCG TABS Take 600 mg by  mouth daily.     estradiol (ESTRACE) 1 MG tablet TAKE 1 TABLET EVERY DAY 90 tablet 3   ezetimibe  (ZETIA ) 10 MG tablet Take 1 tablet by mouth once daily 90 tablet 0   finasteride  (PROSCAR ) 5 MG tablet Take 1 tablet (5 mg total) by mouth daily. 90 tablet 3   fish oil-omega-3 fatty acids 1000 MG capsule Take 1 g by mouth daily.     gabapentin  (NEURONTIN ) 300 MG capsule Take 2 capsules (600 mg total) by mouth at bedtime. 180 capsule 3   Glucosamine-Chondroit-Vit C-Mn (GLUCOSAMINE 1500 COMPLEX PO) Take 2 tablets by mouth at bedtime.     hydrochlorothiazide  (HYDRODIURIL ) 25 MG tablet Take 1 tablet by mouth once daily 90 tablet 0   rivaroxaban  (XARELTO ) 20 MG TABS tablet TAKE 1 TABLET BY MOUTH ONCE DAILY WITH SUPPER 90 tablet 1   ustekinumab  (STELARA ) 45 MG/0.5ML injection Inject 45 mg into the skin every 8 (eight) weeks.     Current Facility-Administered Medications  Medication Dose Route Frequency Provider Last Rate Last Admin   triamcinolone  acetonide (KENALOG -40) injection 40 mg  40 mg Intra-articular Once         Allergies  Allergen Reactions   Mercaptopurine     Used to treat pt. Crohn's Disease.  Caused Pancreatis 1992. Other reaction(s): Other (See Comments) Caused Pancreatis 1992   Amitriptyline Other (See Comments)    No emotions  Felt poorly while taking   Colestipol     GI upset.    Crestor [Rosuvastatin Calcium]     Muscle pain   Metoprolol  Other (See Comments)    Extreme fatigue    Trazodone And Nefazodone     Did not work for pt    Zocor [Simvastatin]     Cramps in legs   Morphine And Codeine Itching   Penicillins Rash    ROS- All systems are reviewed and negative except as per the HPI above.  Physical Exam: Vitals:   12/10/23 1351  BP: (!) 152/78  Pulse: (!) 51  Weight: 81 kg  Height: 5' 5.5 (1.664 m)    GEN- The patient is well appearing, alert and oriented x 3 today.   Neck - no JVD or carotid bruit noted Lungs- Clear to ausculation bilaterally, normal  work of breathing Heart- Regular bradycardic rate and rhythm, no murmurs, rubs or gallops, PMI not laterally displaced Extremities- no clubbing, cyanosis, or edema Skin - no rash or ecchymosis noted   Wt Readings from Last 3 Encounters:  12/10/23 81 kg  11/24/23 80.3 kg  09/03/23 81.6 kg    EKG today demonstrates  Vent. rate 51 BPM PR interval 172 ms QRS duration 84 ms QT/QTcB 422/388 ms P-R-T axes 54 38 21 Sinus bradycardia Cannot rule out Anterior infarct (cited on or before 10-Dec-2023) Abnormal ECG When compared with ECG of 02-May-2023 11:31, Premature atrial complexes are no longer Present  Echo 05/01/21 demonstrated   1. Left ventricular ejection fraction, by estimation, is 60 to 65%. The  left ventricle has normal function. The left ventricle has no regional  wall motion abnormalities. There is mild left ventricular hypertrophy of  the inferior segment. Left ventricular diastolic parameters are indeterminate.   2. Right ventricular systolic function is normal. The right ventricular  size is normal.   3. Left atrial size was mildly dilated.   4. The mitral valve is normal in structure. Mild mitral valve  regurgitation. No evidence of mitral stenosis.   5. The aortic valve is normal in structure. Aortic valve regurgitation is  not visualized. No aortic stenosis is present.   6. The inferior vena cava is normal in size with greater than 50%  respiratory variability, suggesting right atrial pressure of 3 mmHg.   Epic records are reviewed at length today  CHA2DS2-VASc Score = 3  The patient's score is based upon: CHF History: 0 HTN History: 0 Diabetes History: 1 Stroke History: 0 Vascular Disease History: 1 Age Score: 0 Gender Score: 1       ASSESSMENT AND PLAN: 1. Persistent Atrial Fibrillation (ICD10:  I48.19) The patient's CHA2DS2-VASc score is 3, indicating a 3.2% annual risk of stroke.   S/p DCCV on 01/30/22. S/p Afib ablation on 01/09/23 by Dr.  Inocencio.  She is currently in NSR. Will continue with conservative observation via Apple watch.   Continue Xarelto  20 mg daily.    2. HTN Elevated today, trend BP at home.    Follow up 6 months Afib clinic.    Fairy Heinrich, PA-C Afib Clinic [facility] [address] [place], [place] [address] [phone] 12/10/2023 2:33 PM

## 2023-12-16 ENCOUNTER — Encounter: Payer: Self-pay | Admitting: Family Medicine

## 2023-12-16 ENCOUNTER — Ambulatory Visit (INDEPENDENT_AMBULATORY_CARE_PROVIDER_SITE_OTHER): Admitting: Family Medicine

## 2023-12-16 VITALS — BP 130/68 | HR 57 | Temp 98.1°F | Ht 65.5 in | Wt 174.0 lb

## 2023-12-16 DIAGNOSIS — N764 Abscess of vulva: Secondary | ICD-10-CM

## 2023-12-16 DIAGNOSIS — K50812 Crohn's disease of both small and large intestine with intestinal obstruction: Principal | ICD-10-CM

## 2023-12-16 DIAGNOSIS — K5010 Crohn's disease of large intestine without complications: Principal | ICD-10-CM

## 2023-12-16 MED ORDER — CIPROFLOXACIN HCL 500 MG PO TABS: 500.0000 mg | ORAL_TABLET | Freq: Two times a day (BID) | ORAL | 0 refills | Status: DC

## 2023-12-16 MED ORDER — CIPROFLOXACIN 500 MG TABLET
ORAL_TABLET | Freq: Two times a day (BID) | ORAL | 0 refills | 28.00000 days | Status: CP
Start: 2023-12-16 — End: ?

## 2023-12-16 NOTE — Progress Notes (Signed)
 Acute Office Visit  Subjective:    Patient ID: Brittany [name], female    DOB: Dec 26, 1965, 58 y.o.   MRN: [id number]  Chief Complaint  Patient presents with   Vaginal Discharge    HPI: Patient is in today for what she believes she may have a fistula. Yesterday attempted to contact her GI doctor and Nurse via mychart but has not heard back from their office. States she has had some diarrhea, noticed her underwear were wet yesterday and had some brownish discharge but thinks she had a fistula to burst.  Discussed the use of AI scribe software for clinical note transcription with the patient, who gave verbal consent to proceed.  History of Present Illness   Brittany [name] is a 58 year old female with a history of rectovaginal fistula who presents with vaginal discharge and pressure.  Vaginal discharge and pelvic pressure - Onset of symptoms Thursday evening with malaise, progressing to vaginal soreness and pressure between rectum and vagina by Sunday night - Wet sensation and brownish discharge in underwear on Monday morning, similar to prior episodes with fistula - Discharge later became bloody, requiring use of a pad throughout the day - Discharge did not have a strong odor - Sensation of pelvic pressure improved after taking two doses of ciprofloxacin  on Monday  Gastrointestinal symptoms - Diarrhea and nausea began Friday and persisted through the weekend, now resolved - No current diarrhea or nausea  Vaginal soreness and suspected yeast infection - Vaginal soreness began Sunday night - Suspected yeast infection, took one dose of fluconazole  (Diflucan ) on Friday evening with no improvement by Saturday  Fever and constitutional symptoms - Low-grade fevers at night - General malaise at symptom onset  Rectovaginal fistula and surgical history - History of rectovaginal fistula, surgically treated in the 1990s - CT scan in June showed chronic scarring around the anus;  rectovaginal fistula not excluded - History of hysterectomy, bilateral oophorectomy, and appendectomy  Cardiac and respiratory symptoms - No chest pain or shortness of breath - Recent cardiac evaluation was normal       Past Medical History:  Diagnosis Date   BMI 26.0-26.9,adult 07/06/2019   Bradycardia 08/10/2014   Crohn's disease (HCC)    DDD (degenerative disc disease), lumbar 07/17/2017   Diabetes mellitus without complication (HCC)    Fatigue 08/10/2014   Hyperlipidemia    Hyperlipidemia associated with type 2 diabetes mellitus (HCC) 07/06/2019   Hypertension    Hypertension, essential    Mixed hyperlipidemia    Palpitations 07/06/2019   Paresthesia 07/06/2019    Past Surgical History:  Procedure Laterality Date   ABDOMINAL HYSTERECTOMY N/A 10/20/2012   Procedure: HYSTERECTOMY ABDOMINAL;  Surgeon: Peggye Gull, MD;  Location: [facility];  Service: Gynecology;  Laterality: N/A;   ANAL FISSURE REPAIR  05/13/2000   APPENDECTOMY     ATRIAL FIBRILLATION ABLATION N/A 01/09/2023   Procedure: ATRIAL FIBRILLATION ABLATION;  Surgeon: Inocencio Soyla Lunger, MD;  Location: [facility] LAB;  Service: Cardiovascular;  Laterality: N/A;   CARDIOVERSION N/A 07/03/2021   Procedure: CARDIOVERSION;  Surgeon: Kate Lonni CROME, MD;  Location: [facility] ENDOSCOPY;  Service: Cardiovascular;  Laterality: N/A;   CATARACT EXTRACTION Bilateral    EYE SURGERY  05/31/2022   removed film   SALPINGOOPHORECTOMY Bilateral 10/20/2012   Procedure: SALPINGO OOPHORECTOMY;  Surgeon: Peggye Gull, MD;  Location: [facility];  Service: Gynecology;  Laterality: Bilateral;   SMALL INTESTINE SURGERY     reconstruction   TONSILLECTOMY  Family History  Problem Relation Age of Onset   Heart Problems Mother    Diabetes Mother    Diabetes Father    Heart Problems Father    Cancer Maternal Grandmother        lung   Peptic Ulcer Disease Other     Social History   Socioeconomic History   Marital status: Widowed     Spouse name: Not on file   Number of children: 1   Years of education: Not on file   Highest education level: 11th grade  Occupational History   Not on file  Tobacco Use   Smoking status: Former    Current packs/day: 0.00    Types: Cigarettes    Quit date: 2002    Years since quitting: 23.6   Smokeless tobacco: Never   Tobacco comments:    Former smoker 02/05/22  Vaping Use   Vaping status: Never Used  Substance and Sexual Activity   Alcohol use: Yes    Alcohol/week: 1.0 standard drink of alcohol    Types: 1 Standard drinks or equivalent per week    Comment: socially   Drug use: No   Sexual activity: Yes    Partners: Male  Other Topics Concern   Not on file  Social History Narrative   wears sunscreen, brushes and flosses daily, see's dentist bi-annually, has smoke/carbon monoxide detectors, wears a seatbelt and practices gun safety   Social Drivers of Health   Financial Resource Strain: Low Risk  (11/20/2023)   Overall Financial Resource Strain (CARDIA)    Difficulty of Paying Living Expenses: Not very hard  Food Insecurity: No Food Insecurity (11/24/2023)   Hunger Vital Sign    Worried About Running Out of Food in the Last Year: Never true    Ran Out of Food in the Last Year: Never true  Transportation Needs: No Transportation Needs (11/20/2023)   PRAPARE - Administrator, Civil Service (Medical): No    Lack of Transportation (Non-Medical): No  Physical Activity: Insufficiently Active (11/20/2023)   Exercise Vital Sign    Days of Exercise per Week: 3 days    Minutes of Exercise per Session: 30 min  Stress: No Stress Concern Present (06/19/2023)   [employer] of Occupational Health - Occupational Stress Questionnaire    Feeling of Stress : Only a little  Social Connections: Moderately Integrated (11/20/2023)   Social Connection and Isolation Panel    Frequency of Communication with Friends and Family: More than three times a week    Frequency of Social  Gatherings with Friends and Family: More than three times a week    Attends Religious Services: More than 4 times per year    Active Member of [employer] or Organizations: Yes    Attends Banker Meetings: More than 4 times per year    Marital Status: Widowed  Intimate Partner Violence: Not At Risk (09/03/2023)   Humiliation, Afraid, Rape, and Kick questionnaire    Fear of Current or Ex-Partner: No    Emotionally Abused: No    Physically Abused: No    Sexually Abused: No    Outpatient Medications Prior to Visit  Medication Sig Dispense Refill   ALPRAZolam  (XANAX ) 0.5 MG tablet Take 1 tablet (0.5 mg total) by mouth at bedtime as needed for anxiety. 30 tablet 5   Calcium Carbonate-Vitamin D  600-10 MG-MCG TABS Take 600 mg by mouth daily.     estradiol (ESTRACE) 1 MG tablet TAKE 1 TABLET EVERY DAY  90 tablet 3   ezetimibe  (ZETIA ) 10 MG tablet Take 1 tablet by mouth once daily 90 tablet 0   finasteride  (PROSCAR ) 5 MG tablet Take 1 tablet (5 mg total) by mouth daily. 90 tablet 3   fish oil-omega-3 fatty acids 1000 MG capsule Take 1 g by mouth daily.     gabapentin  (NEURONTIN ) 300 MG capsule Take 2 capsules (600 mg total) by mouth at bedtime. 180 capsule 3   Glucosamine-Chondroit-Vit C-Mn (GLUCOSAMINE 1500 COMPLEX PO) Take 2 tablets by mouth at bedtime.     hydrochlorothiazide  (HYDRODIURIL ) 25 MG tablet Take 1 tablet by mouth once daily 90 tablet 0   rivaroxaban  (XARELTO ) 20 MG TABS tablet TAKE 1 TABLET BY MOUTH ONCE DAILY WITH SUPPER 90 tablet 1   ustekinumab  (STELARA ) 45 MG/0.5ML injection Inject 45 mg into the skin every 8 (eight) weeks.     Facility-Administered Medications Prior to Visit  Medication Dose Route Frequency Provider Last Rate Last Admin   triamcinolone  acetonide (KENALOG -40) injection 40 mg  40 mg Intra-articular Once         Allergies  Allergen Reactions   Mercaptopurine     Used to treat pt. Crohn's Disease.  Caused Pancreatis 1992. Other reaction(s): Other (See  Comments) Caused Pancreatis 1992   Amitriptyline Other (See Comments)    No emotions  Felt poorly while taking   Colestipol     GI upset.    Crestor [Rosuvastatin Calcium]     Muscle pain   Metoprolol  Other (See Comments)    Extreme fatigue    Trazodone And Nefazodone     Did not work for pt    Zocor [Simvastatin]     Cramps in legs   Morphine And Codeine Itching   Penicillins Rash    Review of Systems  Constitutional:  Positive for chills. Negative for fatigue and fever.  HENT:  Negative for congestion, ear pain, rhinorrhea and sore throat.   Respiratory:  Negative for cough and shortness of breath.   Cardiovascular:  Negative for chest pain.  Gastrointestinal:  Positive for diarrhea. Negative for abdominal pain, constipation, nausea and vomiting.  Genitourinary:  Positive for vaginal discharge (brownish color). Negative for dysuria and urgency.  Musculoskeletal:  Negative for back pain and myalgias.  Neurological:  Negative for dizziness, weakness, light-headedness and headaches.  Psychiatric/Behavioral:  Negative for dysphoric mood. The patient is not nervous/anxious.        Objective:        12/16/2023   10:27 AM 12/10/2023    1:51 PM 11/24/2023    8:19 AM  Vitals with BMI  Height 5' 5.5 5' 5.5 5' 5.5  Weight 174 lbs 178 lbs 10 oz 177 lbs  BMI 28.5 29.26 29  Systolic 130 152 871  Diastolic 68 78 72  Pulse 57 51 60    No data found.   Physical Exam Vitals reviewed. Exam conducted with a chaperone present.  Constitutional:      Appearance: Normal appearance. She is normal weight.  Neck:     Vascular: No carotid bruit.  Cardiovascular:     Rate and Rhythm: Normal rate and regular rhythm.     Heart sounds: Normal heart sounds.  Pulmonary:     Effort: Pulmonary effort is normal. No respiratory distress.     Breath sounds: Normal breath sounds.  Abdominal:     General: Abdomen is flat. Bowel sounds are normal.     Palpations: Abdomen is soft.      Tenderness: There  is no abdominal tenderness.  Genitourinary:    Exam position: Lithotomy position.     Labia:        Right: No lesion.        Left: Lesion present.      Vagina: Lesions present.      Comments: Tender, draining, fluctuant. No stool or blood in vaginal vault Neurological:     Mental Status: She is alert and oriented to person, place, and time.  Psychiatric:        Mood and Affect: Mood normal.        Behavior: Behavior normal.     Health Maintenance Due  Topic Date Due   COVID-19 Vaccine (3 - Pfizer risk series) 09/22/2019   OPHTHALMOLOGY EXAM  02/24/2022   Zoster Vaccines- Shingrix (2 of 2) 09/24/2023   FOOT EXAM  10/10/2023    There are no preventive care reminders to display for this patient.   Lab Results  Component Value Date   TSH 2.680 11/24/2023   Lab Results  Component Value Date   WBC 6.6 11/24/2023   HGB 14.6 11/24/2023   HCT 45.7 11/24/2023   MCV 94 11/24/2023   PLT 229 11/24/2023   Lab Results  Component Value Date   NA 142 11/24/2023   K 4.3 11/24/2023   CO2 26 11/24/2023   GLUCOSE 101 (H) 11/24/2023   BUN 14 11/24/2023   CREATININE 0.81 11/24/2023   BILITOT 0.6 11/24/2023   ALKPHOS 54 11/24/2023   AST 20 11/24/2023   ALT 20 11/24/2023   PROT 6.9 11/24/2023   ALBUMIN 4.4 11/24/2023   CALCIUM 10.0 11/24/2023   ANIONGAP 10 11/01/2022   EGFR 83 11/24/2023   Lab Results  Component Value Date   CHOL 234 (H) 11/24/2023   Lab Results  Component Value Date   HDL 102 11/24/2023   Lab Results  Component Value Date   LDLCALC 111 (H) 11/24/2023   Lab Results  Component Value Date   TRIG 123 11/24/2023   Lab Results  Component Value Date   CHOLHDL 2.3 11/24/2023   Lab Results  Component Value Date   HGBA1C 5.5 11/24/2023       Assessment & Plan:  Left genital labial abscess Assessment & Plan: Prescription cipro . Complete 7 days twice daily.  Recommend warm baths or warm wet compresses.  Tylenol  for pain.  No  evidence of fistula.    Other orders -     Ciprofloxacin  HCl; Take 1 tablet (500 mg total) by mouth 2 (two) times daily.  Dispense: 14 tablet; Refill: 0     Meds ordered this encounter  Medications   ciprofloxacin  (CIPRO ) 500 MG tablet    Sig: Take 1 tablet (500 mg total) by mouth 2 (two) times daily.    Dispense:  14 tablet    Refill:  0    No orders of the defined types were placed in this encounter.   Follow-up: Return if symptoms worsen or fail to improve.  An After Visit Summary was printed and given to the patient.  Abigail Free, MD [facility] [phone]

## 2023-12-16 NOTE — Assessment & Plan Note (Signed)
 Prescription cipro . Complete 7 days twice daily.  Recommend warm baths or warm wet compresses.  Tylenol  for pain.  No evidence of fistula.

## 2023-12-16 NOTE — Patient Instructions (Addendum)
 Cipro  twice daily x 7 days.  Warm soaks.

## 2023-12-22 DIAGNOSIS — K50919 Crohn's disease, unspecified, with unspecified complications: Secondary | ICD-10-CM | POA: Diagnosis not present

## 2023-12-27 ENCOUNTER — Other Ambulatory Visit: Payer: Self-pay | Admitting: Family Medicine

## 2023-12-27 DIAGNOSIS — R600 Localized edema: Secondary | ICD-10-CM

## 2023-12-30 NOTE — Telephone Encounter (Unsigned)
 Copied from CRM #[id number]. Topic: Clinical - Medication Refill >> Dec 30, 2023  9:34 AM Debby BROCKS wrote: Medication: hydrochlorothiazide  (HYDRODIURIL ) 25 MG tablet  Has the patient contacted their pharmacy? Yes (Agent: If no, request that the patient contact the pharmacy for the refill. If patient does not wish to contact the pharmacy document the reason why and proceed with request.) (Agent: If yes, when and what did the pharmacy advise?) They have not received any refill request from [name]  This is the patient's preferred pharmacy:  [facility] [address], [place] - [address] [facility] [place] [address] Phone: [phone] Fax: [phone]  Is this the correct pharmacy for this prescription? Yes If no, delete pharmacy and type the correct one.   Has the prescription been filled recently? No  Is the patient out of the medication? Yes  Has the patient been seen for an appointment in the last year OR does the patient have an upcoming appointment? Yes  Can we respond through MyChart? Yes  Agent: Please be advised that Rx refills may take up to 3 business days. We ask that you follow-up with your pharmacy.

## 2024-01-13 ENCOUNTER — Other Ambulatory Visit: Payer: Self-pay | Admitting: Family Medicine

## 2024-01-13 DIAGNOSIS — E782 Mixed hyperlipidemia: Secondary | ICD-10-CM

## 2024-02-02 NOTE — Progress Notes (Unsigned)
  Electrophysiology Office Note:   Date:  02/05/2024  ID:  Brittany [name] [place], DOB Jun 27, 1965, MRN [id number]  Primary Cardiologist: None Primary Heart Failure: None Electrophysiologist: Jaquelyne Firkus [name], MD      History of Present Illness:   Brittany [name] is a 58 y.o. female with h/o diabetes, hypertension, hyperlipidemia, Crohn's disease, atrial fibrillation seen today for routine electrophysiology followup.   Since last being seen in our clinic the patient reports doing overall well.  She has had 2 episodes of atrial fibrillation in the last few months.  They have all occurred at the night and have lasted approximately an hour.  Her heart rates are around 110 bpm when she has these episodes.  When she is not having these episodes, she feels well without complaint.  She is able to do her daily activities..  she denies chest pain, palpitations, dyspnea, PND, orthopnea, nausea, vomiting, dizziness, syncope, edema, weight gain, or early satiety.   Review of systems complete and found to be negative unless listed in HPI.   EP Information / Studies Reviewed:    EKG is not ordered today. EKG from 12/10/2023 reviewed which showed sinus rhythm      Risk Assessment/Calculations:    CHA2DS2-VASc Score = 3   This indicates a 3.2% annual risk of stroke. The patient's score is based upon: CHF History: 0 HTN History: 0 Diabetes History: 1 Stroke History: 0 Vascular Disease History: 1 Age Score: 0 Gender Score: 1            Physical Exam:   VS:  BP 126/80 (BP Location: Right Arm, Patient Position: Sitting, Cuff Size: Normal)   Pulse (!) 58   Ht 5' 5.5 (1.664 m)   Wt 174 lb (78.9 kg)   LMP 09/23/2012   SpO2 99%   BMI 28.51 kg/m    Wt Readings from Last 3 Encounters:  02/05/24 174 lb (78.9 kg)  12/16/23 174 lb (78.9 kg)  12/10/23 178 lb 9.6 oz (81 kg)     GEN: Well nourished, well developed in no acute distress NECK: No JVD; No carotid bruits CARDIAC: Regular rate and  rhythm, no murmurs, rubs, gallops RESPIRATORY:  Clear to auscultation without rales, wheezing or rhonchi  ABDOMEN: Soft, non-tender, non-distended EXTREMITIES:  No edema; No deformity   ASSESSMENT AND PLAN:    1.  Persistent atrial fibrillation: Post ablation 01/09/2023.  On Xarelto .  She has had a few episodes of atrial fibrillation, but is overall happy with her control.  We Angella Montas continue with current management.  If she has more episodes she Maddax Palinkas call us  back to get in sooner.  No changes for now.  2.  Secondary hypercoagulable state: On Xarelto   3.  Hypertension: Well-controlled  Follow up with Afib Clinic in 6 months  Signed, Shamar Kracke [name], MD

## 2024-02-05 ENCOUNTER — Encounter: Payer: Self-pay | Admitting: Cardiology

## 2024-02-05 ENCOUNTER — Ambulatory Visit: Attending: Cardiology | Admitting: Cardiology

## 2024-02-05 VITALS — BP 126/80 | HR 58 | Ht 65.5 in | Wt 174.0 lb

## 2024-02-05 DIAGNOSIS — I4819 Other persistent atrial fibrillation: Secondary | ICD-10-CM | POA: Diagnosis not present

## 2024-02-05 DIAGNOSIS — D6869 Other thrombophilia: Secondary | ICD-10-CM

## 2024-02-05 DIAGNOSIS — I10 Essential (primary) hypertension: Secondary | ICD-10-CM

## 2024-02-05 NOTE — Patient Instructions (Signed)
 Medication Instructions:  Your physician recommends that you continue on your current medications as directed. Please refer to the Current Medication list given to you today. *If you need a refill on your cardiac medications before your next appointment, please call your pharmacy*   Follow-Up: At [facility], you and your health needs are our priority.  As part of our continuing mission to provide you with exceptional heart care, our providers are all part of one team.  This team includes your primary Cardiologist (physician) and Advanced Practice Providers or APPs (Physician Assistants and Nurse Practitioners) who all work together to provide you with the care you need, when you need it.  Your next appointment:   6 month(s)  Provider:   You will follow up in the Atrial Fibrillation Clinic located at [facility]. Your provider will be: Clint R. Fenton, PA-C or Minnie Amber, PA-C

## 2024-02-25 NOTE — Progress Notes (Signed)
 Brittany [name]                                          MRN: [id number]   02/25/2024   The [facility]BCI Quality Team Specialist reviewed this patient medical record for the purposes of chart review for care gap closure. The following were reviewed: abstraction for care gap closure-glycemic status assessment.    VBCI Quality Team

## 2024-03-01 NOTE — Assessment & Plan Note (Addendum)
 Diabetes well-controlled with current management and healthy diet. - Encourage continued healthy eating habits, including increased intake of fruits, vegetables, and lean proteins. Orders:   POCT glycosylated hemoglobin (Hb A1C)   POCT Lipid Panel   CBC with Differential/Platelet   Comprehensive metabolic panel with GFR

## 2024-03-01 NOTE — Assessment & Plan Note (Addendum)
 The current medical regimen is effective;  continue present plan and medications. Continue gabapentin 300 mg 2 at bedtime.

## 2024-03-01 NOTE — Assessment & Plan Note (Addendum)
 Concerns about the cost and approval of Stelara. -Advise patient to reapply for the program or seek assistance to reduce the cost.

## 2024-03-01 NOTE — Progress Notes (Signed)
 "  Subjective:  Patient ID: Brittany [name], female    DOB: [birth year]  Age: 58 y.o. MRN: [id number]  Chief Complaint  Patient presents with   Medical Management of Chronic Issues    HPI: Discussed the use of AI scribe software for clinical note transcription with the patient, who gave verbal consent to proceed.  History of Present Illness Brittany [name] is a 58 year old female with atrial fibrillation who presents with right shoulder pain and concerns about flying with AFib.  Right shoulder pain - Severe right shoulder pain, primarily at night, disrupts sleep - Pain is severe enough to wake her from sleep - Difficulty moving right arm, especially with lifting or reaching behind head - Previous steroid injection in left shoulder was effective, but current pain is in right shoulder - Tylenol  used for pain relief without significant benefit - Unable to take anti-inflammatory medications - Adjustments to pillows and sleeping position have not provided relief  Atrial fibrillation symptoms and concerns - History of atrial fibrillation with ablation procedure over a year ago - Continues to experience brief, sporadic episodes of atrial fibrillation, often at night and waking her from sleep - Monitors episodes using an EKG app on her phone - No air travel since 2019; planning a cruise in January that will require flying - Concerned about flying with atrial fibrillation - Not currently on amiodarone  Sleep disturbance and fatigue - Difficulty sleeping, waking frequently during the night, possibly related to right shoulder pain - Recent increase in fatigue - Brief episode of nausea and fatigue last week, resolved without fever or cold symptoms  Alcohol use concerns - Social drinker - Concerned about avoiding alcohol, particularly liquor, due to atrial fibrillation while on cruise.  - Would like to enjoy social drinking during upcoming cruise       03/02/2024    9:03 AM  11/24/2023    8:16 AM 08/07/2023   10:29 AM 10/10/2022    9:48 AM 08/13/2022    4:03 PM  Depression screen PHQ 2/9  Decreased Interest 0 0 0 0 0  Down, Depressed, Hopeless 0 0 0 0 0  PHQ - 2 Score 0 0 0 0 0  Altered sleeping  1  1   Tired, decreased energy  1  3   Change in appetite  0  0   Feeling bad or failure about yourself   0  0   Trouble concentrating  0  0   Moving slowly or fidgety/restless  0  0   Suicidal thoughts  0  0   PHQ-9 Score  2  4   Difficult doing work/chores  Not difficult at all  Not difficult at all         11/24/2023    8:15 AM  Fall Risk   Falls in the past year? 0  Number falls in past yr: 0  Injury with Fall? 0  Risk for fall due to : No Fall Risks  Follow up Falls evaluation completed    Patient Care Team: Sherre Clapper, MD as PCP - General (Family Medicine) Inocencio Soyla Lunger, MD as PCP - Electrophysiology (Cardiology) Nyle Rankin POUR, [facility] (Inactive) (Pharmacist) Ablott, Roselie (Optometry) Okey Leader, MD as Consulting Physician (Obstetrics and Gynecology)   Review of Systems  All other systems reviewed and are negative.   Current Outpatient Medications on File Prior to Visit  Medication Sig Dispense Refill   ALPRAZolam  (XANAX ) 0.5 MG tablet Take 1 tablet (0.5 mg  total) by mouth at bedtime as needed for anxiety. 30 tablet 5   Calcium Carbonate-Vitamin D  600-10 MG-MCG TABS Take 600 mg by mouth daily.     estradiol (ESTRACE) 1 MG tablet TAKE 1 TABLET EVERY DAY 90 tablet 3   ezetimibe  (ZETIA ) 10 MG tablet Take 1 tablet by mouth once daily 90 tablet 0   finasteride  (PROSCAR ) 5 MG tablet Take 1 tablet (5 mg total) by mouth daily. 90 tablet 3   fish oil-omega-3 fatty acids 1000 MG capsule Take 1 g by mouth daily.     gabapentin  (NEURONTIN ) 300 MG capsule Take 2 capsules (600 mg total) by mouth at bedtime. 180 capsule 3   Glucosamine-Chondroit-Vit C-Mn (GLUCOSAMINE 1500 COMPLEX PO) Take 2 tablets by mouth at bedtime.     hydrochlorothiazide   (HYDRODIURIL ) 25 MG tablet Take 1 tablet by mouth once daily 90 tablet 0   rivaroxaban  (XARELTO ) 20 MG TABS tablet TAKE 1 TABLET BY MOUTH ONCE DAILY WITH SUPPER 90 tablet 1   ustekinumab  (STELARA ) 45 MG/0.5ML injection Inject 45 mg into the skin every 8 (eight) weeks.     Current Facility-Administered Medications on File Prior to Visit  Medication Dose Route Frequency Provider Last Rate Last Admin   triamcinolone  acetonide (KENALOG -40) injection 40 mg  40 mg Intra-articular Once        Past Medical History:  Diagnosis Date   BMI 26.0-26.9,adult 07/06/2019   Bradycardia 08/10/2014   Crohn's disease (HCC)    DDD (degenerative disc disease), lumbar 07/17/2017   Diabetes mellitus without complication (HCC)    Fatigue 08/10/2014   Hyperlipidemia    Hyperlipidemia associated with type 2 diabetes mellitus (HCC) 07/06/2019   Hypertension    Hypertension, essential    Mixed hyperlipidemia    Palpitations 07/06/2019   Paresthesia 07/06/2019   Past Surgical History:  Procedure Laterality Date   ABDOMINAL HYSTERECTOMY N/A 10/20/2012   Procedure: HYSTERECTOMY ABDOMINAL;  Surgeon: Peggye Gull, MD;  Location: [facility];  Service: Gynecology;  Laterality: N/A;   ANAL FISSURE REPAIR  05/13/2000   APPENDECTOMY     ATRIAL FIBRILLATION ABLATION N/A 01/09/2023   Procedure: ATRIAL FIBRILLATION ABLATION;  Surgeon: Inocencio Soyla Lunger, MD;  Location: [facility] INVASIVE CV LAB;  Service: Cardiovascular;  Laterality: N/A;   CARDIOVERSION N/A 07/03/2021   Procedure: CARDIOVERSION;  Surgeon: Kate Lonni CROME, MD;  Location: [facility] ENDOSCOPY;  Service: Cardiovascular;  Laterality: N/A;   CATARACT EXTRACTION Bilateral    EYE SURGERY  05/31/2022   removed film   SALPINGOOPHORECTOMY Bilateral 10/20/2012   Procedure: SALPINGO OOPHORECTOMY;  Surgeon: Peggye Gull, MD;  Location: [facility] ORS;  Service: Gynecology;  Laterality: Bilateral;   SMALL INTESTINE SURGERY     reconstruction   TONSILLECTOMY      Family History  Problem  Relation Age of Onset   Heart Problems Mother    Diabetes Mother    Diabetes Father    Heart Problems Father    Cancer Maternal Grandmother        lung   Peptic Ulcer Disease Other    Social History   Socioeconomic History   Marital status: Widowed    Spouse name: Not on file   Number of children: 1   Years of education: Not on file   Highest education level: 11th grade  Occupational History   Not on file  Tobacco Use   Smoking status: Former    Current packs/day: 0.00    Types: Cigarettes    Quit date: 2002    Years  since quitting: 23.8   Smokeless tobacco: Never   Tobacco comments:    Former smoker 02/05/22  Vaping Use   Vaping status: Never Used  Substance and Sexual Activity   Alcohol use: Yes    Alcohol/week: 1.0 standard drink of alcohol    Types: 1 Standard drinks or equivalent per week    Comment: socially   Drug use: No   Sexual activity: Yes    Partners: Male  Other Topics Concern   Not on file  Social History Narrative   wears sunscreen, brushes and flosses daily, see's dentist bi-annually, has smoke/carbon monoxide detectors, wears a seatbelt and practices gun safety   Social Drivers of Health   Financial Resource Strain: Low Risk  (02/27/2024)   Overall Financial Resource Strain (CARDIA)    Difficulty of Paying Living Expenses: Not very hard  Food Insecurity: Patient Declined (02/27/2024)   Hunger Vital Sign    Worried About Running Out of Food in the Last Year: Patient declined    Ran Out of Food in the Last Year: Patient declined  Transportation Needs: No Transportation Needs (02/27/2024)   PRAPARE - Administrator, Civil Service (Medical): No    Lack of Transportation (Non-Medical): No  Physical Activity: Insufficiently Active (02/27/2024)   Exercise Vital Sign    Days of Exercise per Week: 3 days    Minutes of Exercise per Session: 30 min  Stress: No Stress Concern Present (02/27/2024)   [employer] of Occupational Health  - Occupational Stress Questionnaire    Feeling of Stress: Only a little  Social Connections: Moderately Integrated (02/27/2024)   Social Connection and Isolation Panel    Frequency of Communication with Friends and Family: Three times a week    Frequency of Social Gatherings with Friends and Family: Three times a week    Attends Religious Services: More than 4 times per year    Active Member of Clubs or Organizations: Yes    Attends Banker Meetings: More than 4 times per year    Marital Status: Widowed    Objective:  BP 124/68   Pulse (!) 55   Temp 98.3 F (36.8 C)   Ht 5' 5.5 (1.664 m)   Wt 178 lb (80.7 kg)   LMP 09/23/2012   SpO2 96%   BMI 29.17 kg/m      03/02/2024    9:01 AM 02/05/2024    9:37 AM 12/16/2023   10:27 AM  BP/Weight  Systolic BP 124 126 130  Diastolic BP 68 80 68  Wt. (Lbs) 178 174 174  BMI 29.17 kg/m2 28.51 kg/m2 28.51 kg/m2    Physical Exam Vitals reviewed.  Constitutional:      Appearance: Normal appearance. She is normal weight.  Neck:     Vascular: No carotid bruit.  Cardiovascular:     Rate and Rhythm: Normal rate and regular rhythm.     Pulses: Normal pulses.     Heart sounds: Normal heart sounds.  Pulmonary:     Effort: Pulmonary effort is normal. No respiratory distress.     Breath sounds: Normal breath sounds.  Abdominal:     General: Abdomen is flat. Bowel sounds are normal.     Palpations: Abdomen is soft.     Tenderness: There is no abdominal tenderness.  Musculoskeletal:     Comments: RIGHT SHOULDER EXAM ROM ABNORMAL ABDUCTION: LIMITED EXTERNAL ROTATION: LIMITED INTERNAL ROTATION: LIMITED   Neurological:     Mental Status: She is alert and  oriented to person, place, and time.  Psychiatric:        Mood and Affect: Mood normal.        Behavior: Behavior normal.      Diabetic foot exam was performed with the following findings:   No deformities, ulcerations, or other skin breakdown Normal sensation of 10g  monofilament Intact posterior tibialis and dorsalis pedis pulses     Procedures  Lab Results  Component Value Date   WBC 5.5 03/02/2024   HGB 13.3 03/02/2024   HCT 41.8 03/02/2024   PLT 219 03/02/2024   GLUCOSE 91 03/02/2024   CHOL 234 (H) 11/24/2023   TRIG 123 11/24/2023   HDL 102 11/24/2023   LDLCALC 111 (H) 11/24/2023   ALT 16 03/02/2024   AST 19 03/02/2024   NA 142 03/02/2024   K 4.3 03/02/2024   CL 103 03/02/2024   CREATININE 0.64 03/02/2024   BUN 15 03/02/2024   CO2 24 03/02/2024   TSH 2.680 11/24/2023   INR 0.86 10/20/2012   HGBA1C 5.4 03/02/2024    Results for orders placed or performed in visit on 03/02/24  POCT glycosylated hemoglobin (Hb A1C)   Collection Time: 03/02/24  9:27 AM  Result Value Ref Range   Hemoglobin A1C     HbA1c POC (<> result, manual entry) 5.4 4.0 - 5.6 %   HbA1c, POC (prediabetic range)     HbA1c, POC (controlled diabetic range)    POCT Lipid Panel   Collection Time: 03/02/24  9:27 AM  Result Value Ref Range   TC 207    HDL 76    TRG 127    LDL 106    Non-HDL 131    TC/HDL    CBC with Differential/Platelet   Collection Time: 03/02/24  9:38 AM  Result Value Ref Range   WBC 5.5 3.4 - 10.8 x10E3/uL   RBC 4.52 3.77 - 5.28 x10E6/uL   Hemoglobin 13.3 11.1 - 15.9 g/dL   Hematocrit 58.1 65.9 - 46.6 %   MCV 93 79 - 97 fL   MCH 29.4 26.6 - 33.0 pg   MCHC 31.8 31.5 - 35.7 g/dL   RDW 87.2 88.2 - 84.5 %   Platelets 219 150 - 450 x10E3/uL   Neutrophils 57 Not Estab. %   Lymphs 33 Not Estab. %   Monocytes 8 Not Estab. %   Eos 1 Not Estab. %   Basos 1 Not Estab. %   Neutrophils Absolute 3.1 1.4 - 7.0 x10E3/uL   Lymphocytes Absolute 1.8 0.7 - 3.1 x10E3/uL   Monocytes Absolute 0.4 0.1 - 0.9 x10E3/uL   EOS (ABSOLUTE) 0.1 0.0 - 0.4 x10E3/uL   Basophils Absolute 0.1 0.0 - 0.2 x10E3/uL   Immature Granulocytes 0 Not Estab. %   Immature Grans (Abs) 0.0 0.0 - 0.1 x10E3/uL  Comprehensive metabolic panel with GFR   Collection Time: 03/02/24   9:38 AM  Result Value Ref Range   Glucose 91 70 - 99 mg/dL   BUN 15 8 - 27 mg/dL   Creatinine, Ser 9.35 0.57 - 1.00 mg/dL   eGFR 898 >40 fO/fpw/8.26   BUN/Creatinine Ratio 23 12 - 28   Sodium 142 134 - 144 mmol/L   Potassium 4.3 3.5 - 5.2 mmol/L   Chloride 103 96 - 106 mmol/L   CO2 24 20 - 29 mmol/L   Calcium 9.4 8.7 - 10.3 mg/dL   Total Protein 6.4 6.0 - 8.5 g/dL   Albumin 4.1 3.8 - 4.9 g/dL  Globulin, Total 2.3 1.5 - 4.5 g/dL   Bilirubin Total 0.7 0.0 - 1.2 mg/dL   Alkaline Phosphatase 50 49 - 135 IU/L   AST 19 0 - 40 IU/L   ALT 16 0 - 32 IU/L  .  Assessment & Plan:   Assessment & Plan Hyperlipidemia associated with type 2 diabetes mellitus (HCC) Diabetes well-controlled with current management and healthy diet. - Encourage continued healthy eating habits, including increased intake of fruits, vegetables, and lean proteins.  Orders:   POCT glycosylated hemoglobin (Hb A1C)   POCT Lipid Panel   CBC with Differential/Platelet   Comprehensive metabolic panel with GFR  Longstanding persistent atrial fibrillation (HCC) Experiences brief, sporadic episodes primarily at night. Previous ablation did not fully resolve episodes. On anticoagulation therapy to reduce thromboembolic risk. Flying deemed low risk with current treatment. Vagal maneuvers discussed for episode alleviation. - Continue current management with blood thinners. - Consult cardiologist if episodes become more frequent or concerning. - Reinforced no use of alcohol due to triggering atrial fibrillation.    Crohn's disease of both small and large intestine without complication (HCC) Concerns about the cost and approval of Stelara . -Advise patient to reapply for the program or seek assistance to reduce the cost.     Idiopathic neuropathy The current medical regimen is effective;  continue present plan and medications. Continue gabapentin  300 mg 2 at bedtime.      Acute pain of right shoulder Significant  nocturnal pain likely due to arthritis, similar to previous left shoulder issue resolved with steroid injection. Recent flu shot necessitates a two-week wait before injection. - Follow up in 2 weeks for injection.  - Consider x-ray if injection does not relieve pain.  Orders:   DG Shoulder Right; Future  Encounter for immunization  Orders:   Flu vaccine, recombinant, trivalent, inj    Body mass index is 29.17 kg/m.    No orders of the defined types were placed in this encounter.   Orders Placed This Encounter  Procedures   Joint Injection/Arthrocentesis   DG Shoulder Right   Flu vaccine, recombinant, trivalent, inj   CBC with Differential/Platelet   Comprehensive metabolic panel with GFR   POCT glycosylated hemoglobin (Hb A1C)   POCT Lipid Panel     Follow-up: Return in about 2 weeks (around 03/16/2024) for shoulder injection with me. 4 months..  An After Visit Summary was printed and given to the patient.   LILLETTE Kato I Leal-Borjas,acting as a scribe for Abigail Free, MD.,have documented all relevant documentation on the behalf of Abigail Free, MD,as directed by  Abigail Free, MD while in the presence of Abigail Free, MD.   I attest that I have reviewed this visit and agree with the plan scribed by my staff.   Abigail Free, MD [facility] [phone]    "

## 2024-03-01 NOTE — Assessment & Plan Note (Addendum)
 Experiences brief, sporadic episodes primarily at night. Previous ablation did not fully resolve episodes. On anticoagulation therapy to reduce thromboembolic risk. Flying deemed low risk with current treatment. Vagal maneuvers discussed for episode alleviation. - Continue current management with blood thinners. - Consult cardiologist if episodes become more frequent or concerning. - Reinforced no use of alcohol due to triggering atrial fibrillation.

## 2024-03-02 ENCOUNTER — Encounter: Payer: Self-pay | Admitting: Family Medicine

## 2024-03-02 ENCOUNTER — Ambulatory Visit: Admitting: Family Medicine

## 2024-03-02 VITALS — BP 124/68 | HR 55 | Temp 98.3°F | Ht 65.5 in | Wt 178.0 lb

## 2024-03-02 DIAGNOSIS — G609 Hereditary and idiopathic neuropathy, unspecified: Secondary | ICD-10-CM | POA: Diagnosis not present

## 2024-03-02 DIAGNOSIS — Z23 Encounter for immunization: Secondary | ICD-10-CM

## 2024-03-02 DIAGNOSIS — E1169 Type 2 diabetes mellitus with other specified complication: Secondary | ICD-10-CM | POA: Diagnosis not present

## 2024-03-02 DIAGNOSIS — M25511 Pain in right shoulder: Secondary | ICD-10-CM | POA: Diagnosis not present

## 2024-03-02 DIAGNOSIS — K5080 Crohn's disease of both small and large intestine without complications: Secondary | ICD-10-CM

## 2024-03-02 DIAGNOSIS — E785 Hyperlipidemia, unspecified: Secondary | ICD-10-CM

## 2024-03-02 DIAGNOSIS — I4811 Longstanding persistent atrial fibrillation: Secondary | ICD-10-CM | POA: Diagnosis not present

## 2024-03-02 LAB — POCT LIPID PANEL
HDL: 76
LDL: 106
Non-HDL: 131
TC: 207
TRG: 127

## 2024-03-02 LAB — POCT GLYCOSYLATED HEMOGLOBIN (HGB A1C): HbA1c POC (<> result, manual entry): 5.4 % (ref 4.0–5.6)

## 2024-03-02 NOTE — Patient Instructions (Signed)
  VISIT SUMMARY: Today, we addressed your right shoulder pain, concerns about flying with atrial fibrillation, and general health maintenance. We also discussed your diabetes management and alcohol use concerns.  YOUR PLAN: RIGHT SHOULDER PAIN: You have severe pain in your right shoulder, especially at night, which disrupts your sleep and limits your arm movement. -We will administer a steroid injection to your right shoulder in two weeks. If the pain persists, we may consider an x-ray. - order in for xray to be able to go to the [facility] med center  ATRIAL FIBRILLATION: You experience brief, sporadic episodes of atrial fibrillation, mostly at night. You are concerned about flying with this condition. -Continue taking your blood thinners as prescribed. -Flying is considered low risk with your current treatment. -Use vagal maneuvers to help alleviate episodes. -Consult your cardiologist if episodes become more frequent or concerning.  TYPE 2 DIABETES MELLITUS: Your diabetes is well-controlled with your current management and healthy diet. -Continue your healthy eating habits, including more fruits, vegetables, and lean proteins.  GENERAL HEALTH MAINTENANCE: We discussed the benefits of a balanced diet and regular physical activity. -Engage in regular physical activity, such as using the local recreation center. -Incorporate healthy foods into your diet.                      Contains text generated by Abridge.                                 Contains text generated by Abridge.

## 2024-03-03 ENCOUNTER — Ambulatory Visit: Payer: Self-pay | Admitting: Family Medicine

## 2024-03-03 LAB — COMPREHENSIVE METABOLIC PANEL WITH GFR
ALT: 16 IU/L (ref 0–32)
AST: 19 IU/L (ref 0–40)
Albumin: 4.1 g/dL (ref 3.8–4.9)
Alkaline Phosphatase: 50 IU/L (ref 49–135)
BUN/Creatinine Ratio: 23 (ref 12–28)
BUN: 15 mg/dL (ref 8–27)
Bilirubin Total: 0.7 mg/dL (ref 0.0–1.2)
CO2: 24 mmol/L (ref 20–29)
Calcium: 9.4 mg/dL (ref 8.7–10.3)
Chloride: 103 mmol/L (ref 96–106)
Creatinine, Ser: 0.64 mg/dL (ref 0.57–1.00)
Globulin, Total: 2.3 g/dL (ref 1.5–4.5)
Glucose: 91 mg/dL (ref 70–99)
Potassium: 4.3 mmol/L (ref 3.5–5.2)
Sodium: 142 mmol/L (ref 134–144)
Total Protein: 6.4 g/dL (ref 6.0–8.5)
eGFR: 101 mL/min/1.73 (ref >59)

## 2024-03-03 LAB — CBC WITH DIFFERENTIAL/PLATELET
Basophils Absolute: 0.1 x10E3/uL (ref 0.0–0.2)
Basos: 1 %
EOS (ABSOLUTE): 0.1 x10E3/uL (ref 0.0–0.4)
Eos: 1 %
Hematocrit: 41.8 % (ref 34.0–46.6)
Hemoglobin: 13.3 g/dL (ref 11.1–15.9)
Immature Grans (Abs): 0 x10E3/uL (ref 0.0–0.1)
Immature Granulocytes: 0 %
Lymphocytes Absolute: 1.8 x10E3/uL (ref 0.7–3.1)
Lymphs: 33 %
MCH: 29.4 pg (ref 26.6–33.0)
MCHC: 31.8 g/dL (ref 31.5–35.7)
MCV: 93 fL (ref 79–97)
Monocytes Absolute: 0.4 x10E3/uL (ref 0.1–0.9)
Monocytes: 8 %
Neutrophils Absolute: 3.1 x10E3/uL (ref 1.4–7.0)
Neutrophils: 57 %
Platelets: 219 x10E3/uL (ref 150–450)
RBC: 4.52 x10E6/uL (ref 3.77–5.28)
RDW: 12.7 % (ref 11.7–15.4)
WBC: 5.5 x10E3/uL (ref 3.4–10.8)

## 2024-03-06 DIAGNOSIS — G8929 Other chronic pain: Secondary | ICD-10-CM | POA: Insufficient documentation

## 2024-03-06 DIAGNOSIS — M25511 Pain in right shoulder: Secondary | ICD-10-CM | POA: Insufficient documentation

## 2024-03-06 NOTE — Assessment & Plan Note (Addendum)
 Significant nocturnal pain likely due to arthritis, similar to previous left shoulder issue resolved with steroid injection. Recent flu shot necessitates a two-week wait before injection. - Follow up in 2 weeks for injection.  - Consider x-ray if injection does not relieve pain.  Orders:   DG Shoulder Right; Future

## 2024-03-08 ENCOUNTER — Other Ambulatory Visit: Payer: Self-pay | Admitting: Family Medicine

## 2024-03-18 ENCOUNTER — Ambulatory Visit (INDEPENDENT_AMBULATORY_CARE_PROVIDER_SITE_OTHER): Admitting: Family Medicine

## 2024-03-18 VITALS — BP 128/74 | HR 58 | Temp 98.1°F | Ht 65.5 in | Wt 178.0 lb

## 2024-03-18 DIAGNOSIS — G8929 Other chronic pain: Secondary | ICD-10-CM

## 2024-03-18 NOTE — Assessment & Plan Note (Addendum)
 Chronic pain likely due to arthritis or pinched rotator cuff.  Differential includes arthritis, pinched or torn rotator cuff. - Administered shoulder injection for pain relief. - Monitor response to injection; consider further workup if no improvement, specifically xray and mri. Orders:   Joint Injection/Arthrocentesis

## 2024-03-18 NOTE — Progress Notes (Signed)
 Acute Office Visit  Subjective:    Patient ID: Brittany [name], female    DOB: January 06, 1966, 58 y.o.   MRN: [id number]  Chief Complaint  Patient presents with   Shoulder Pain    Right. Wants injection    HPI: Patient is in today for right shoulder pain  Discussed the use of AI scribe software for clinical note transcription with the patient, who gave verbal consent to proceed.  History of Present Illness Brittany [name] is a 58 year old female who presents with bilateral shoulder pain.  Bilateral shoulder pain - Soreness in both shoulders, described as similar to post-exercise soreness - Pain present upon waking and persists throughout the day - Pain improves with certain movements but worsens when lifting arms above a certain angle - Previous injection provided some relief, but pain persists, particularly with specific arm movements  Lower back symptoms - History of lower back issues - No perceived relationship between lower back symptoms and current shoulder pain    Past Medical History:  Diagnosis Date   BMI 26.0-26.9,adult 07/06/2019   Bradycardia 08/10/2014   Crohn's disease (HCC)    DDD (degenerative disc disease), lumbar 07/17/2017   Diabetes mellitus without complication (HCC)    Fatigue 08/10/2014   Hyperlipidemia    Hyperlipidemia associated with type 2 diabetes mellitus (HCC) 07/06/2019   Hypertension    Hypertension, essential    Mixed hyperlipidemia    Palpitations 07/06/2019   Paresthesia 07/06/2019    Past Surgical History:  Procedure Laterality Date   ABDOMINAL HYSTERECTOMY N/A 10/20/2012   Procedure: HYSTERECTOMY ABDOMINAL;  Surgeon: Peggye Gull, MD;  Location: [facility];  Service: Gynecology;  Laterality: N/A;   ANAL FISSURE REPAIR  05/13/2000   APPENDECTOMY     ATRIAL FIBRILLATION ABLATION N/A 01/09/2023   Procedure: ATRIAL FIBRILLATION ABLATION;  Surgeon: Inocencio Soyla Lunger, MD;  Location: [facility];  Service: Cardiovascular;   Laterality: N/A;   CARDIOVERSION N/A 07/03/2021   Procedure: CARDIOVERSION;  Surgeon: Kate Lonni CROME, MD;  Location: [facility];  Service: Cardiovascular;  Laterality: N/A;   CATARACT EXTRACTION Bilateral    EYE SURGERY  05/31/2022   removed film   SALPINGOOPHORECTOMY Bilateral 10/20/2012   Procedure: SALPINGO OOPHORECTOMY;  Surgeon: Peggye Gull, MD;  Location: [facility];  Service: Gynecology;  Laterality: Bilateral;   SMALL INTESTINE SURGERY     reconstruction   TONSILLECTOMY      Family History  Problem Relation Age of Onset   Heart Problems Mother    Diabetes Mother    Diabetes Father    Heart Problems Father    Cancer Maternal Grandmother        lung   Peptic Ulcer Disease Other     Social History   Socioeconomic History   Marital status: Widowed    Spouse name: Not on file   Number of children: 1   Years of education: Not on file   Highest education level: 11th grade  Occupational History   Not on file  Tobacco Use   Smoking status: Former    Current packs/day: 0.00    Types: Cigarettes    Quit date: 2002    Years since quitting: 23.8   Smokeless tobacco: Never   Tobacco comments:    Former smoker 02/05/22  Vaping Use   Vaping status: Never Used  Substance and Sexual Activity   Alcohol use: Yes    Alcohol/week: 1.0 standard drink of alcohol    Types: 1 Standard drinks  or equivalent per week    Comment: socially   Drug use: No   Sexual activity: Yes    Partners: Male  Other Topics Concern   Not on file  Social History Narrative   wears sunscreen, brushes and flosses daily, see's dentist bi-annually, has smoke/carbon monoxide detectors, wears a seatbelt and practices gun safety   Social Drivers of Health   Financial Resource Strain: Low Risk  (02/27/2024)   Overall Financial Resource Strain (CARDIA)    Difficulty of Paying Living Expenses: Not very hard  Food Insecurity: Patient Declined (02/27/2024)   Hunger Vital Sign    Worried About  Running Out of Food in the Last Year: Patient declined    Ran Out of Food in the Last Year: Patient declined  Transportation Needs: No Transportation Needs (02/27/2024)   PRAPARE - Administrator, Civil Service (Medical): No    Lack of Transportation (Non-Medical): No  Physical Activity: Insufficiently Active (02/27/2024)   Exercise Vital Sign    Days of Exercise per Week: 3 days    Minutes of Exercise per Session: 30 min  Stress: No Stress Concern Present (02/27/2024)   [employer] of Occupational Health - Occupational Stress Questionnaire    Feeling of Stress: Only a little  Social Connections: Moderately Integrated (02/27/2024)   Social Connection and Isolation Panel    Frequency of Communication with Friends and Family: Three times a week    Frequency of Social Gatherings with Friends and Family: Three times a week    Attends Religious Services: More than 4 times per year    Active Member of Clubs or Organizations: Yes    Attends Banker Meetings: More than 4 times per year    Marital Status: Widowed  Intimate Partner Violence: Not At Risk (09/03/2023)   Humiliation, Afraid, Rape, and Kick questionnaire    Fear of Current or Ex-Partner: No    Emotionally Abused: No    Physically Abused: No    Sexually Abused: No    Outpatient Medications Prior to Visit  Medication Sig Dispense Refill   ALPRAZolam  (XANAX ) 0.5 MG tablet TAKE 1 TABLET BY MOUTH AT BEDTIME AS NEEDED FOR ANXIETY 30 tablet 5   Calcium Carbonate-Vitamin D  600-10 MG-MCG TABS Take 600 mg by mouth daily.     estradiol (ESTRACE) 1 MG tablet TAKE 1 TABLET EVERY DAY 90 tablet 3   ezetimibe  (ZETIA ) 10 MG tablet Take 1 tablet by mouth once daily 90 tablet 0   finasteride  (PROSCAR ) 5 MG tablet Take 1 tablet (5 mg total) by mouth daily. 90 tablet 3   fish oil-omega-3 fatty acids 1000 MG capsule Take 1 g by mouth daily.     gabapentin  (NEURONTIN ) 300 MG capsule Take 2 capsules (600 mg total) by  mouth at bedtime. 180 capsule 3   Glucosamine-Chondroit-Vit C-Mn (GLUCOSAMINE 1500 COMPLEX PO) Take 2 tablets by mouth at bedtime.     hydrochlorothiazide  (HYDRODIURIL ) 25 MG tablet Take 1 tablet by mouth once daily 90 tablet 0   rivaroxaban  (XARELTO ) 20 MG TABS tablet TAKE 1 TABLET BY MOUTH ONCE DAILY WITH SUPPER 90 tablet 1   ustekinumab  (STELARA ) 45 MG/0.5ML injection Inject 45 mg into the skin every 8 (eight) weeks.     Facility-Administered Medications Prior to Visit  Medication Dose Route Frequency Provider Last Rate Last Admin   triamcinolone  acetonide (KENALOG -40) injection 40 mg  40 mg Intra-articular Once         Allergies  Allergen Reactions  Mercaptopurine     Used to treat pt. Crohn's Disease.  Caused Pancreatis 1992. Other reaction(s): Other (See Comments) Caused Pancreatis 1992   Amitriptyline Other (See Comments)    No emotions  Felt poorly while taking   Colestipol     GI upset.    Crestor [Rosuvastatin Calcium]     Muscle pain   Metoprolol  Other (See Comments)    Extreme fatigue    Trazodone And Nefazodone     Did not work for pt    Zocor [Simvastatin]     Cramps in legs   Morphine And Codeine Itching   Penicillins Rash    Review of Systems  Musculoskeletal:  Positive for arthralgias.       Objective:        03/18/2024   10:41 AM 03/02/2024    9:01 AM 02/05/2024    9:37 AM  Vitals with BMI  Height 5' 5.5 5' 5.5 5' 5.5  Weight 178 lbs 178 lbs 174 lbs  BMI 29.16 29.16 28.5  Systolic 128 124 873  Diastolic 74 68 80  Pulse 58 55 58    No data found.   Physical Exam Vitals reviewed.  Musculoskeletal:     Comments: RIGHT SHOULDER EXAM TENDER: superiorly ABDUCTION: ABNORMAL EXTERNAL ROTATION: ABNORMAL INTERNAL ROTATION: ABNORMAL  LEFT SHOULDER EXAM FROM NORMAL   Joint Injection/Arthrocentesis  Date/Time: 03/20/2024 10:41 PM  Performed by: Sherre Clapper, MD Authorized by: Sherre Clapper, MD  Indications: pain  Body area:  shoulder Joint: right shoulder Local anesthesia used: yes  Anesthesia: Local anesthesia used: yes Local Anesthetic: topical anesthetic  Sedation: Patient sedated: no  Needle size: 22 G Ultrasound guidance: no Approach: posterior Triamcinolone  amount: 40 mg Lidocaine  1% amount: 5 mL Patient tolerance: patient tolerated the procedure well with no immediate complications      Health Maintenance Due  Topic Date Due   COVID-19 Vaccine (3 - Pfizer risk series) 09/22/2019   Zoster Vaccines- Shingrix (2 of 2) 09/24/2023   OPHTHALMOLOGY EXAM  03/10/2024    There are no preventive care reminders to display for this patient.   Lab Results  Component Value Date   TSH 2.680 11/24/2023   Lab Results  Component Value Date   WBC 5.5 03/02/2024   HGB 13.3 03/02/2024   HCT 41.8 03/02/2024   MCV 93 03/02/2024   PLT 219 03/02/2024   Lab Results  Component Value Date   NA 142 03/02/2024   K 4.3 03/02/2024   CO2 24 03/02/2024   GLUCOSE 91 03/02/2024   BUN 15 03/02/2024   CREATININE 0.64 03/02/2024   BILITOT 0.7 03/02/2024   ALKPHOS 50 03/02/2024   AST 19 03/02/2024   ALT 16 03/02/2024   PROT 6.4 03/02/2024   ALBUMIN 4.1 03/02/2024   CALCIUM 9.4 03/02/2024   ANIONGAP 10 11/01/2022   EGFR 101 03/02/2024   Lab Results  Component Value Date   CHOL 234 (H) 11/24/2023   Lab Results  Component Value Date   HDL 102 11/24/2023   Lab Results  Component Value Date   LDLCALC 111 (H) 11/24/2023   Lab Results  Component Value Date   TRIG 123 11/24/2023   Lab Results  Component Value Date   CHOLHDL 2.3 11/24/2023   Lab Results  Component Value Date   HGBA1C 5.4 03/02/2024        Results for orders placed or performed in visit on 03/02/24  POCT glycosylated hemoglobin (Hb A1C)   Collection Time: 03/02/24  9:27 AM  Result Value Ref Range   Hemoglobin A1C     HbA1c POC (<> result, manual entry) 5.4 4.0 - 5.6 %   HbA1c, POC (prediabetic range)     HbA1c, POC  (controlled diabetic range)    POCT Lipid Panel   Collection Time: 03/02/24  9:27 AM  Result Value Ref Range   TC 207    HDL 76    TRG 127    LDL 106    Non-HDL 131    TC/HDL    CBC with Differential/Platelet   Collection Time: 03/02/24  9:38 AM  Result Value Ref Range   WBC 5.5 3.4 - 10.8 x10E3/uL   RBC 4.52 3.77 - 5.28 x10E6/uL   Hemoglobin 13.3 11.1 - 15.9 g/dL   Hematocrit 58.1 65.9 - 46.6 %   MCV 93 79 - 97 fL   MCH 29.4 26.6 - 33.0 pg   MCHC 31.8 31.5 - 35.7 g/dL   RDW 87.2 88.2 - 84.5 %   Platelets 219 150 - 450 x10E3/uL   Neutrophils 57 Not Estab. %   Lymphs 33 Not Estab. %   Monocytes 8 Not Estab. %   Eos 1 Not Estab. %   Basos 1 Not Estab. %   Neutrophils Absolute 3.1 1.4 - 7.0 x10E3/uL   Lymphocytes Absolute 1.8 0.7 - 3.1 x10E3/uL   Monocytes Absolute 0.4 0.1 - 0.9 x10E3/uL   EOS (ABSOLUTE) 0.1 0.0 - 0.4 x10E3/uL   Basophils Absolute 0.1 0.0 - 0.2 x10E3/uL   Immature Granulocytes 0 Not Estab. %   Immature Grans (Abs) 0.0 0.0 - 0.1 x10E3/uL  Comprehensive metabolic panel with GFR   Collection Time: 03/02/24  9:38 AM  Result Value Ref Range   Glucose 91 70 - 99 mg/dL   BUN 15 8 - 27 mg/dL   Creatinine, Ser 9.35 0.57 - 1.00 mg/dL   eGFR 898 >40 fO/fpw/8.26   BUN/Creatinine Ratio 23 12 - 28   Sodium 142 134 - 144 mmol/L   Potassium 4.3 3.5 - 5.2 mmol/L   Chloride 103 96 - 106 mmol/L   CO2 24 20 - 29 mmol/L   Calcium 9.4 8.7 - 10.3 mg/dL   Total Protein 6.4 6.0 - 8.5 g/dL   Albumin 4.1 3.8 - 4.9 g/dL   Globulin, Total 2.3 1.5 - 4.5 g/dL   Bilirubin Total 0.7 0.0 - 1.2 mg/dL   Alkaline Phosphatase 50 49 - 135 IU/L   AST 19 0 - 40 IU/L   ALT 16 0 - 32 IU/L     Assessment & Plan:   Assessment & Plan Chronic right shoulder pain Chronic pain likely due to arthritis or pinched rotator cuff.  Differential includes arthritis, pinched or torn rotator cuff. - Administered shoulder injection for pain relief. - Monitor response to injection; consider further  workup if no improvement, specifically xray and mri. Orders:   Joint Injection/Arthrocentesis    Body mass index is 29.17 kg/m.SABRA   No orders of the defined types were placed in this encounter.   Orders Placed This Encounter  Procedures   Joint Injection/Arthrocentesis     I,Marla I Leal-Borjas,acting as a scribe for Abigail Free, MD.,have documented all relevant documentation on the behalf of Abigail Free, MD,as directed by  Abigail Free, MD while in the presence of Abigail Free, MD.   Follow-up: No follow-ups on file.  An After Visit Summary was printed and given to the patient.  I attest that I have reviewed this visit and agree with  the plan scribed by my staff.   Abigail Free, MD [facility] [phone]

## 2024-03-20 ENCOUNTER — Encounter: Payer: Self-pay | Admitting: Family Medicine

## 2024-03-23 ENCOUNTER — Other Ambulatory Visit: Payer: Self-pay | Admitting: Family Medicine

## 2024-03-23 DIAGNOSIS — R600 Localized edema: Secondary | ICD-10-CM

## 2024-03-24 ENCOUNTER — Ambulatory Visit: Admitting: Family Medicine

## 2024-03-24 ENCOUNTER — Encounter: Payer: Self-pay | Admitting: Family Medicine

## 2024-03-24 VITALS — BP 156/88 | HR 56 | Temp 97.8°F | Resp 16 | Ht 65.5 in | Wt 176.0 lb

## 2024-03-24 DIAGNOSIS — U071 COVID-19: Secondary | ICD-10-CM | POA: Insufficient documentation

## 2024-03-24 DIAGNOSIS — R0989 Other specified symptoms and signs involving the circulatory and respiratory systems: Secondary | ICD-10-CM | POA: Diagnosis not present

## 2024-03-24 DIAGNOSIS — J019 Acute sinusitis, unspecified: Secondary | ICD-10-CM | POA: Insufficient documentation

## 2024-03-24 DIAGNOSIS — J069 Acute upper respiratory infection, unspecified: Secondary | ICD-10-CM | POA: Diagnosis not present

## 2024-03-24 DIAGNOSIS — R030 Elevated blood-pressure reading, without diagnosis of hypertension: Secondary | ICD-10-CM | POA: Diagnosis not present

## 2024-03-24 LAB — POC COVID19/FLU A&B COMBO
Covid Antigen, POC: POSITIVE — AB
Influenza A Antigen, POC: NEGATIVE
Influenza B Antigen, POC: NEGATIVE

## 2024-03-24 NOTE — Assessment & Plan Note (Signed)
 Symptoms started 4 days ago. Flu - negative Covid - positive Patient evaluated, tested and sent home with instructions for home care and Quarantine. Instructed to seek further care if symptoms worsen.  - Call the office tomorrow regarding antiviral medication,  Orders:   POC Covid19/Flu A&B Antigen

## 2024-03-24 NOTE — Progress Notes (Signed)
 Acute Office Visit  Subjective:    Patient ID: Brittany [name], female    DOB: 17-Mar-1966, 58 y.o.   MRN: [id number]  Chief Complaint  Patient presents with   Headache   Nasal Congestion    Discussed the use of AI scribe software for clinical note transcription with the patient, who gave verbal consent to proceed.  History of Present Illness   Brittany [name] is a 58 year old female with Crohn's disease who presents with congestion and headache.  Upper respiratory symptoms - Congestion and headache present for approximately one week, onset Sunday - Sinus pressure localized to the head, worsens with bending down - Sneezing present - Nasal discharge is clear with nose blowing - No ear pain, sore throat, or night sweats - No fever - Chills occurred on Sunday night or Monday - No known exposure to sick individuals despite attending church on Sunday  Medication use and response - Tried Mucinex without significant relief of symptoms - Has not taken antihistamines due to concerns about drug interactions with current medications  Immunosuppression and infection precautions - Crohn's disease managed with Stelara  - Uses blood thinner Retinal - Cautious about infection prevention, including use of hand sanitizer  Elevated BP without the diagnosis of Hypertension - Blood pressure measured at 188/94 earlier in the day - Previous blood pressure was 128/74 on November 6      Past Medical History:  Diagnosis Date   Arthritis    BMI 26.0-26.9,adult 07/06/2019   Bradycardia 08/10/2014   Crohn's disease (HCC)    DDD (degenerative disc disease), lumbar 07/17/2017   Diabetes mellitus without complication (HCC)    Fatigue 08/10/2014   Hyperlipidemia    Hyperlipidemia associated with type 2 diabetes mellitus (HCC) 07/06/2019   Hypertension    Hypertension, essential    Mixed hyperlipidemia    Palpitations 07/06/2019   Paresthesia 07/06/2019    Past Surgical History:   Procedure Laterality Date   ABDOMINAL HYSTERECTOMY N/A 10/20/2012   Procedure: HYSTERECTOMY ABDOMINAL;  Surgeon: Peggye Gull, MD;  Location: [facility];  Service: Gynecology;  Laterality: N/A;   ANAL FISSURE REPAIR  05/13/2000   APPENDECTOMY     ATRIAL FIBRILLATION ABLATION N/A 01/09/2023   Procedure: ATRIAL FIBRILLATION ABLATION;  Surgeon: Inocencio Soyla Lunger, MD;  Location: [facility];  Service: Cardiovascular;  Laterality: N/A;   CARDIOVERSION N/A 07/03/2021   Procedure: CARDIOVERSION;  Surgeon: Kate Lonni CROME, MD;  Location: [facility];  Service: Cardiovascular;  Laterality: N/A;   CATARACT EXTRACTION Bilateral    EYE SURGERY  05/31/2022   removed film   SALPINGOOPHORECTOMY Bilateral 10/20/2012   Procedure: SALPINGO OOPHORECTOMY;  Surgeon: Peggye Gull, MD;  Location: [facility];  Service: Gynecology;  Laterality: Bilateral;   SMALL INTESTINE SURGERY     reconstruction   TONSILLECTOMY      Family History  Problem Relation Age of Onset   Heart Problems Mother    Diabetes Mother    Diabetes Father    Heart Problems Father    Cancer Maternal Grandmother        lung   Peptic Ulcer Disease Other     Social History   Socioeconomic History   Marital status: Widowed    Spouse name: Not on file   Number of children: 1   Years of education: Not on file   Highest education level: 11th grade  Occupational History   Not on file  Tobacco Use   Smoking status: Former  Current packs/day: 0.00    Types: Cigarettes    Quit date: 2002    Years since quitting: 23.8   Smokeless tobacco: Never   Tobacco comments:    Former smoker 02/05/22  Vaping Use   Vaping status: Never Used  Substance and Sexual Activity   Alcohol use: Yes    Alcohol/week: 1.0 standard drink of alcohol    Types: 1 Standard drinks or equivalent per week    Comment: socially   Drug use: No   Sexual activity: Yes    Partners: Male  Other Topics Concern   Not on file  Social History Narrative    wears sunscreen, brushes and flosses daily, see's dentist bi-annually, has smoke/carbon monoxide detectors, wears a seatbelt and practices gun safety   Social Drivers of Health   Financial Resource Strain: Low Risk  (02/27/2024)   Overall Financial Resource Strain (CARDIA)    Difficulty of Paying Living Expenses: Not very hard  Food Insecurity: Patient Declined (02/27/2024)   Hunger Vital Sign    Worried About Running Out of Food in the Last Year: Patient declined    Ran Out of Food in the Last Year: Patient declined  Transportation Needs: No Transportation Needs (02/27/2024)   PRAPARE - Administrator, Civil Service (Medical): No    Lack of Transportation (Non-Medical): No  Physical Activity: Insufficiently Active (02/27/2024)   Exercise Vital Sign    Days of Exercise per Week: 3 days    Minutes of Exercise per Session: 30 min  Stress: No Stress Concern Present (02/27/2024)   [employer] of Occupational Health - Occupational Stress Questionnaire    Feeling of Stress: Only a little  Social Connections: Moderately Integrated (02/27/2024)   Social Connection and Isolation Panel    Frequency of Communication with Friends and Family: Three times a week    Frequency of Social Gatherings with Friends and Family: Three times a week    Attends Religious Services: More than 4 times per year    Active Member of Clubs or Organizations: Yes    Attends Banker Meetings: More than 4 times per year    Marital Status: Widowed  Intimate Partner Violence: Not At Risk (09/03/2023)   Humiliation, Afraid, Rape, and Kick questionnaire    Fear of Current or Ex-Partner: No    Emotionally Abused: No    Physically Abused: No    Sexually Abused: No    Outpatient Medications Prior to Visit  Medication Sig Dispense Refill   ALPRAZolam  (XANAX ) 0.5 MG tablet TAKE 1 TABLET BY MOUTH AT BEDTIME AS NEEDED FOR ANXIETY 30 tablet 5   Calcium Carbonate-Vitamin D  600-10 MG-MCG TABS Take  600 mg by mouth daily.     estradiol (ESTRACE) 1 MG tablet TAKE 1 TABLET EVERY DAY 90 tablet 3   ezetimibe  (ZETIA ) 10 MG tablet Take 1 tablet by mouth once daily 90 tablet 0   finasteride  (PROSCAR ) 5 MG tablet Take 1 tablet (5 mg total) by mouth daily. 90 tablet 3   fish oil-omega-3 fatty acids 1000 MG capsule Take 1 g by mouth daily.     gabapentin  (NEURONTIN ) 300 MG capsule Take 2 capsules (600 mg total) by mouth at bedtime. 180 capsule 3   Glucosamine-Chondroit-Vit C-Mn (GLUCOSAMINE 1500 COMPLEX PO) Take 2 tablets by mouth at bedtime.     hydrochlorothiazide  (HYDRODIURIL ) 25 MG tablet Take 1 tablet by mouth once daily 90 tablet 0   rivaroxaban  (XARELTO ) 20 MG TABS tablet TAKE 1 TABLET  BY MOUTH ONCE DAILY WITH SUPPER 90 tablet 1   ustekinumab  (STELARA ) 45 MG/0.5ML injection Inject 45 mg into the skin every 8 (eight) weeks.     Facility-Administered Medications Prior to Visit  Medication Dose Route Frequency Provider Last Rate Last Admin   triamcinolone  acetonide (KENALOG -40) injection 40 mg  40 mg Intra-articular Once         Allergies  Allergen Reactions   Mercaptopurine     Used to treat pt. Crohn's Disease.  Caused Pancreatis 1992. Other reaction(s): Other (See Comments) Caused Pancreatis 1992   Amitriptyline Other (See Comments)    No emotions  Felt poorly while taking   Colestipol     GI upset.    Crestor [Rosuvastatin Calcium]     Muscle pain   Metoprolol  Other (See Comments)    Extreme fatigue    Trazodone And Nefazodone     Did not work for pt    Zocor [Simvastatin]     Cramps in legs   Morphine And Codeine Itching   Penicillins Rash    Review of Systems  Constitutional:  Positive for chills. Negative for appetite change, diaphoresis, fatigue and fever.  HENT:  Positive for congestion, sinus pressure and sneezing. Negative for ear pain, sinus pain and sore throat.   Eyes: Negative.   Respiratory:  Negative for cough, chest tightness, shortness of breath and  wheezing.   Cardiovascular:  Negative for chest pain and palpitations.  Gastrointestinal:  Negative for abdominal pain, constipation, diarrhea, nausea and vomiting.  Endocrine: Negative.   Genitourinary:  Negative for dysuria and hematuria.  Musculoskeletal:  Negative for arthralgias, back pain, joint swelling and myalgias.  Skin:  Negative for rash.  Allergic/Immunologic: Negative.   Neurological:  Positive for headaches. Negative for dizziness and weakness.  Psychiatric/Behavioral:  Negative for dysphoric mood. The patient is not nervous/anxious.        Objective:        03/24/2024    2:33 PM 03/24/2024    2:11 PM 03/18/2024   10:41 AM  Vitals with BMI  Height  5' 5.5 5' 5.5  Weight  176 lbs 178 lbs  BMI  28.83 29.16  Systolic 156 188 871  Diastolic 88 94 74  Pulse  56 58    Orthostatic VS for the past 72 hrs (Last 3 readings):  Patient Position BP Location Cuff Size  03/24/24 1433 Sitting Left Arm Large     Physical Exam Constitutional:      General: She is not in acute distress.    Appearance: Normal appearance. She is ill-appearing.  HENT:     Right Ear: Tympanic membrane normal.     Left Ear: Tympanic membrane normal.     Nose:     Right Sinus: No frontal sinus tenderness.     Left Sinus: No frontal sinus tenderness.     Mouth/Throat:     Pharynx: No posterior oropharyngeal erythema.  Neck:     Vascular: No carotid bruit.  Cardiovascular:     Rate and Rhythm: Normal rate and regular rhythm.     Heart sounds: Normal heart sounds.  Pulmonary:     Effort: Pulmonary effort is normal. No respiratory distress.     Breath sounds: Normal breath sounds. No wheezing.  Abdominal:     General: Bowel sounds are normal.     Palpations: Abdomen is soft.     Tenderness: There is no abdominal tenderness.  Neurological:     Mental Status: She is alert. Mental  status is at baseline.  Psychiatric:        Mood and Affect: Mood normal.        Behavior: Behavior normal.      Health Maintenance Due  Topic Date Due   COVID-19 Vaccine (3 - Pfizer risk series) 09/22/2019   Zoster Vaccines- Shingrix (2 of 2) 09/24/2023   OPHTHALMOLOGY EXAM  03/10/2024    There are no preventive care reminders to display for this patient.   Lab Results  Component Value Date   TSH 2.680 11/24/2023   Lab Results  Component Value Date   WBC 5.5 03/02/2024   HGB 13.3 03/02/2024   HCT 41.8 03/02/2024   MCV 93 03/02/2024   PLT 219 03/02/2024   Lab Results  Component Value Date   NA 142 03/02/2024   K 4.3 03/02/2024   CO2 24 03/02/2024   GLUCOSE 91 03/02/2024   BUN 15 03/02/2024   CREATININE 0.64 03/02/2024   BILITOT 0.7 03/02/2024   ALKPHOS 50 03/02/2024   AST 19 03/02/2024   ALT 16 03/02/2024   PROT 6.4 03/02/2024   ALBUMIN 4.1 03/02/2024   CALCIUM 9.4 03/02/2024   ANIONGAP 10 11/01/2022   EGFR 101 03/02/2024   Lab Results  Component Value Date   CHOL 234 (H) 11/24/2023   Lab Results  Component Value Date   HDL 102 11/24/2023   Lab Results  Component Value Date   LDLCALC 111 (H) 11/24/2023   Lab Results  Component Value Date   TRIG 123 11/24/2023   Lab Results  Component Value Date   CHOLHDL 2.3 11/24/2023   Lab Results  Component Value Date   HGBA1C 5.4 03/02/2024        Results for orders placed or performed in visit on 03/24/24  POC Covid19/Flu A&B Antigen   Collection Time: 03/24/24  2:48 PM  Result Value Ref Range   Influenza A Antigen, POC Negative Negative   Influenza B Antigen, POC Negative Negative   Covid Antigen, POC Positive (A) Negative     Assessment & Plan:   Assessment & Plan Upper respiratory tract infection due to COVID-19 virus Acute upper respiratory symptoms (congestion, headache, sneezing) Symptoms suggest viral infection or allergies. No fever or sore throat. Blood pressure elevated, likely stress-related. - Checked for flu and COVID. - Continue guaifenesin for congestion.    Elevated BP reading w/  no diagnosis of HTN Blood pressure elevated at 188/94. Most likely related to rushing to appointment or Covid infection BP Readings from Last 3 Encounters:  03/24/24 (!) 156/88  03/18/24 128/74  03/02/24 124/68  - Rechecked blood pressure. - Monitor BP at home    Symptoms of upper respiratory infection (URI) Symptoms started 4 days ago. Flu - negative Covid - positive Patient evaluated, tested and sent home with instructions for home care and Quarantine. Instructed to seek further care if symptoms worsen.  - Call the office tomorrow regarding antiviral medication,  Orders:   POC Covid19/Flu A&B Antigen    Orders Placed This Encounter  Procedures   POC Covid19/Flu A&B Antigen     Follow-up: Return if symptoms worsen or fail to improve.  An After Visit Summary was printed and given to the patient.   Harrie Cedar, FNP [name] [phone]

## 2024-03-24 NOTE — Assessment & Plan Note (Signed)
 Acute upper respiratory symptoms (congestion, headache, sneezing) Symptoms suggest viral infection or allergies. No fever or sore throat. Blood pressure elevated, likely stress-related. - Checked for flu and COVID. - Continue guaifenesin for congestion.

## 2024-03-24 NOTE — Assessment & Plan Note (Signed)
 Blood pressure elevated at 188/94. Most likely related to rushing to appointment or Covid infection BP Readings from Last 3 Encounters:  03/24/24 (!) 156/88  03/18/24 128/74  03/02/24 124/68  - Rechecked blood pressure. - Monitor BP at home

## 2024-03-30 ENCOUNTER — Other Ambulatory Visit: Payer: Self-pay | Admitting: Cardiology

## 2024-03-30 DIAGNOSIS — I4819 Other persistent atrial fibrillation: Secondary | ICD-10-CM

## 2024-03-30 NOTE — Telephone Encounter (Signed)
 Prescription refill request for Xarelto  received.  Indication: a fib Last office visit: 02/05/24 Weight: 176# Age: 58 Scr: 0.64 epic 03/02/24 CrCl: 118 ml/min

## 2024-04-05 DIAGNOSIS — K50812 Crohn's disease of both small and large intestine with intestinal obstruction: Principal | ICD-10-CM

## 2024-04-05 MED ORDER — STEQEYMA 90 MG/ML SUBCUTANEOUS SYRINGE
SUBCUTANEOUS | 1 refills | 0.00000 days
Start: 2024-04-05 — End: ?

## 2024-04-21 DIAGNOSIS — K9089 Other intestinal malabsorption: Principal | ICD-10-CM

## 2024-04-21 DIAGNOSIS — K50812 Crohn's disease of both small and large intestine with intestinal obstruction: Principal | ICD-10-CM

## 2024-04-21 DIAGNOSIS — K5010 Crohn's disease of large intestine without complications: Principal | ICD-10-CM

## 2024-05-10 NOTE — Progress Notes (Unsigned)
 "  Acute Office Visit  Subjective:    Patient ID: Brittany [name], female    DOB: 04/01/1966, 58 y.o.   MRN: [id number]  No chief complaint on file.   HPI: Patient is in today for sinus congestions and minor cough.  Discussed the use of AI scribe software for clinical note transcription with the patient, who gave verbal consent to proceed.  History of Present Illness Brittany [name] is a 58 year old female who presents with sinus congestion and pressure.  She has been experiencing sinus congestion and pressure since before Christmas. The symptoms have somewhat improved with the use of Mucinex, but the issue has not completely resolved. She describes the pressure and pain as persistent, with occasional coughing due to drainage in the back of her throat.  No nausea or fever is present, although she feels cold, which she attributes to the changing weather. She had a previous COVID-19 infection diagnosed in early November, and her COVID-19 test for the current illness has been negative so far.  When the symptoms first started, she experienced a sensation of a tunnel and some pain in her left ear, along with a 'talking vibration' and a feeling of being underwater. She has been using Mucinex to manage the symptoms.  She is planning to go on a cruise in mid-January and is concerned about staying healthy for the trip. She mentions her boyfriend,and her plans to travel to several destinations including [place], [place], [place], and [place].      Past Medical History:  Diagnosis Date   Arthritis    BMI 26.0-26.9,adult 07/06/2019   Bradycardia 08/10/2014   Crohn's disease (HCC)    DDD (degenerative disc disease), lumbar 07/17/2017   Diabetes mellitus without complication (HCC)    Fatigue 08/10/2014   Hyperlipidemia    Hyperlipidemia associated with type 2 diabetes mellitus (HCC) 07/06/2019   Hypertension    Hypertension, essential    Mixed hyperlipidemia     Palpitations 07/06/2019   Paresthesia 07/06/2019    Past Surgical History:  Procedure Laterality Date   ABDOMINAL HYSTERECTOMY N/A 10/20/2012   Procedure: HYSTERECTOMY ABDOMINAL;  Surgeon: Peggye Gull, MD;  Location: [facility];  Service: Gynecology;  Laterality: N/A;   ANAL FISSURE REPAIR  05/13/2000   APPENDECTOMY     ATRIAL FIBRILLATION ABLATION N/A 01/09/2023   Procedure: ATRIAL FIBRILLATION ABLATION;  Surgeon: Inocencio Soyla Lunger, MD;  Location: [facility];  Service: Cardiovascular;  Laterality: N/A;   CARDIOVERSION N/A 07/03/2021   Procedure: CARDIOVERSION;  Surgeon: Kate Lonni CROME, MD;  Location: [facility];  Service: Cardiovascular;  Laterality: N/A;   CATARACT EXTRACTION Bilateral    EYE SURGERY  05/31/2022   removed film   SALPINGOOPHORECTOMY Bilateral 10/20/2012   Procedure: SALPINGO OOPHORECTOMY;  Surgeon: Peggye Gull, MD;  Location: [facility];  Service: Gynecology;  Laterality: Bilateral;   SMALL INTESTINE SURGERY     reconstruction   TONSILLECTOMY      Family History  Problem Relation Age of Onset   Heart Problems Mother    Diabetes Mother    Diabetes Father    Heart Problems Father    Cancer Maternal Grandmother        lung   Peptic Ulcer Disease Other     Social History   Socioeconomic History   Marital status: Widowed    Spouse name: Not on file   Number of children: 1   Years of education: Not on file   Highest education  level: 11th grade  Occupational History   Not on file  Tobacco Use   Smoking status: Former    Current packs/day: 0.00    Types: Cigarettes    Quit date: 2002    Years since quitting: 24.0   Smokeless tobacco: Never   Tobacco comments:    Former smoker 02/05/22  Vaping Use   Vaping status: Never Used  Substance and Sexual Activity   Alcohol use: Yes    Alcohol/week: 1.0 standard drink of alcohol    Types: 1 Standard drinks or equivalent per week    Comment: socially   Drug use: No   Sexual activity: Yes     Partners: Male  Other Topics Concern   Not on file  Social History Narrative   wears sunscreen, brushes and flosses daily, see's dentist bi-annually, has smoke/carbon monoxide detectors, wears a seatbelt and practices gun safety   Social Drivers of Health   Tobacco Use: Medium Risk (04/21/2024)   Received from [facility]   Patient History    Smoking Tobacco Use: Former    Smokeless Tobacco Use: Never    Passive Exposure: Not on file  Financial Resource Strain: Low Risk (02/27/2024)   Overall Financial Resource Strain (CARDIA)    Difficulty of Paying Living Expenses: Not very hard  Food Insecurity: Patient Declined (02/27/2024)   Epic    Worried About Programme Researcher, Broadcasting/film/video in the Last Year: Patient declined    Barista in the Last Year: Patient declined  Transportation Needs: No Transportation Needs (02/27/2024)   Epic    Lack of Transportation (Medical): No    Lack of Transportation (Non-Medical): No  Physical Activity: Insufficiently Active (02/27/2024)   Exercise Vital Sign    Days of Exercise per Week: 3 days    Minutes of Exercise per Session: 30 min  Stress: No Stress Concern Present (02/27/2024)   [employer] of Occupational Health - Occupational Stress Questionnaire    Feeling of Stress: Only a little  Social Connections: Moderately Integrated (02/27/2024)   Social Connection and Isolation Panel    Frequency of Communication with Friends and Family: Three times a week    Frequency of Social Gatherings with Friends and Family: Three times a week    Attends Religious Services: More than 4 times per year    Active Member of Clubs or Organizations: Yes    Attends Banker Meetings: More than 4 times per year    Marital Status: Widowed  Intimate Partner Violence: Not At Risk (09/03/2023)   Humiliation, Afraid, Rape, and Kick questionnaire    Fear of Current or Ex-Partner: No    Emotionally Abused: No    Physically Abused: No    Sexually  Abused: No  Depression (PHQ2-9): Low Risk (03/02/2024)   Depression (PHQ2-9)    PHQ-2 Score: 0  Alcohol Screen: Low Risk (02/27/2024)   Alcohol Screen    Last Alcohol Screening Score (AUDIT): 0  Housing: Low Risk (03/02/2024)   Epic    Unable to Pay for Housing in the Last Year: No    Number of Times Moved in the Last Year: 0    Homeless in the Last Year: No  Utilities: Not At Risk (07/21/2023)   [employer]HC Utilities    Threatened with loss of utilities: No  Health Literacy: Adequate Health Literacy (07/21/2023)   B1300 Health Literacy    Frequency of need for help with medical instructions: Never    Outpatient Medications Prior to  Visit  Medication Sig Dispense Refill   ALPRAZolam  (XANAX ) 0.5 MG tablet TAKE 1 TABLET BY MOUTH AT BEDTIME AS NEEDED FOR ANXIETY 30 tablet 5   Calcium Carbonate-Vitamin D  600-10 MG-MCG TABS Take 600 mg by mouth daily.     estradiol (ESTRACE) 1 MG tablet TAKE 1 TABLET EVERY DAY 90 tablet 3   ezetimibe  (ZETIA ) 10 MG tablet Take 1 tablet by mouth once daily 90 tablet 0   finasteride  (PROSCAR ) 5 MG tablet Take 1 tablet (5 mg total) by mouth daily. 90 tablet 3   fish oil-omega-3 fatty acids 1000 MG capsule Take 1 g by mouth daily.     gabapentin  (NEURONTIN ) 300 MG capsule Take 2 capsules (600 mg total) by mouth at bedtime. 180 capsule 3   Glucosamine-Chondroit-Vit C-Mn (GLUCOSAMINE 1500 COMPLEX PO) Take 2 tablets by mouth at bedtime.     hydrochlorothiazide  (HYDRODIURIL ) 25 MG tablet Take 1 tablet by mouth once daily 90 tablet 0   rivaroxaban  (XARELTO ) 20 MG TABS tablet TAKE 1 TABLET BY MOUTH ONCE DAILY WITH SUPPER 90 tablet 1   ustekinumab  (STELARA ) 45 MG/0.5ML injection Inject 45 mg into the skin every 8 (eight) weeks.     Facility-Administered Medications Prior to Visit  Medication Dose Route Frequency Provider Last Rate Last Admin   triamcinolone  acetonide (KENALOG -40) injection 40 mg  40 mg Intra-articular Once         Allergies[1]  Review of Systems   Constitutional:  Negative for appetite change, fatigue and fever.  HENT:  Positive for congestion, sinus pressure and sinus pain. Negative for ear pain and sore throat.   Respiratory:  Negative for cough, chest tightness, shortness of breath and wheezing.   Cardiovascular:  Negative for chest pain and palpitations.  Gastrointestinal:  Negative for abdominal pain, constipation, diarrhea, nausea and vomiting.  Genitourinary:  Negative for dysuria and hematuria.  Musculoskeletal:  Negative for arthralgias, back pain, joint swelling and myalgias.  Skin:  Negative for rash.  Neurological:  Negative for dizziness, weakness and headaches.  Psychiatric/Behavioral:  Negative for dysphoric mood. The patient is not nervous/anxious.        Objective:        03/24/2024    2:33 PM 03/24/2024    2:11 PM 03/18/2024   10:41 AM  Vitals with BMI  Height  5' 5.5 5' 5.5  Weight  176 lbs 178 lbs  BMI  28.83 29.16  Systolic 156 188 871  Diastolic 88 94 74  Pulse  56 58    No data found.   Physical Exam Vitals reviewed.  Constitutional:      Appearance: Normal appearance.  HENT:     Right Ear: Hearing normal. Tympanic membrane is bulging. Tympanic membrane is not erythematous.     Left Ear: Hearing normal. Tympanic membrane is erythematous and bulging.  Cardiovascular:     Rate and Rhythm: Normal rate and regular rhythm.     Heart sounds: Normal heart sounds.  Pulmonary:     Effort: Pulmonary effort is normal.     Breath sounds: Normal breath sounds.  Abdominal:     General: Bowel sounds are normal.     Palpations: Abdomen is soft.     Tenderness: There is no abdominal tenderness.  Neurological:     Mental Status: She is alert and oriented to person, place, and time.  Psychiatric:        Mood and Affect: Mood normal.        Behavior: Behavior normal.  Health Maintenance Due  Topic Date Due   COVID-19 Vaccine (3 - Pfizer risk series) 09/22/2019   Zoster Vaccines- Shingrix (2  of 2) 09/24/2023   OPHTHALMOLOGY EXAM  03/10/2024   Medicare Annual Wellness (AWV)  06/18/2024    There are no preventive care reminders to display for this patient.   Lab Results  Component Value Date   TSH 2.680 11/24/2023   Lab Results  Component Value Date   WBC 5.5 03/02/2024   HGB 13.3 03/02/2024   HCT 41.8 03/02/2024   MCV 93 03/02/2024   PLT 219 03/02/2024   Lab Results  Component Value Date   NA 142 03/02/2024   K 4.3 03/02/2024   CO2 24 03/02/2024   GLUCOSE 91 03/02/2024   BUN 15 03/02/2024   CREATININE 0.64 03/02/2024   BILITOT 0.7 03/02/2024   ALKPHOS 50 03/02/2024   AST 19 03/02/2024   ALT 16 03/02/2024   PROT 6.4 03/02/2024   ALBUMIN 4.1 03/02/2024   CALCIUM 9.4 03/02/2024   ANIONGAP 10 11/01/2022   EGFR 101 03/02/2024   Lab Results  Component Value Date   CHOL 234 (H) 11/24/2023   Lab Results  Component Value Date   HDL 102 11/24/2023   Lab Results  Component Value Date   LDLCALC 111 (H) 11/24/2023   Lab Results  Component Value Date   TRIG 123 11/24/2023   Lab Results  Component Value Date   CHOLHDL 2.3 11/24/2023   Lab Results  Component Value Date   HGBA1C 5.4 03/02/2024        Results for orders placed or performed in visit on 03/24/24  POC Covid19/Flu A&B Antigen   Collection Time: 03/24/24  2:48 PM  Result Value Ref Range   Influenza A Antigen, POC Negative Negative   Influenza B Antigen, POC Negative Negative   Covid Antigen, POC Positive (A) Negative     Assessment & Plan:   Assessment & Plan Acute non-recurrent sinusitis of other sinus Acute sinusitis with otitis media Sinus congestion with ear fluid suggests sinus infection. Negative COVID test. Azithromycin  chosen for treatment. - Prescribed azithromycin  to [facility] in [place]. - Continue Mucinex. - Advised to report if symptoms do not improve or change. Orders:   POC COVID-19 BinaxNow   azithromycin  (ZITHROMAX ) 250 MG tablet; Take 2 tablets on day 1, then  1 tablet daily on days 2 through 5     There is no height or weight on file to calculate BMI..     No orders of the defined types were placed in this encounter.   No orders of the defined types were placed in this encounter.    Follow-up: No follow-ups on file.  An After Visit Summary was printed and given to the patient.  I,Lauren M Auman,acting as a neurosurgeon for [employer].,have documented all relevant documentation on the behalf of Nola Angles, PA,as directed by  Nola Angles, PA while in the presence of Nola Angles, [place].    Nola Angles, [place] [name] [phone]     [1]  Allergies Allergen Reactions   Mercaptopurine     Used to treat pt. Crohn's Disease.  Caused Pancreatis 1992. Other reaction(s): Other (See Comments) Caused Pancreatis 1992   Amitriptyline Other (See Comments)    No emotions  Felt poorly while taking   Colestipol     GI upset.    Crestor [Rosuvastatin Calcium]     Muscle pain   Metoprolol  Other (See Comments)  Extreme fatigue    Trazodone And Nefazodone     Did not work for pt    Zocor [Simvastatin]     Cramps in legs   Morphine And Codeine Itching   Penicillins Rash   "

## 2024-05-11 ENCOUNTER — Other Ambulatory Visit: Payer: Self-pay | Admitting: Family Medicine

## 2024-05-11 ENCOUNTER — Ambulatory Visit (INDEPENDENT_AMBULATORY_CARE_PROVIDER_SITE_OTHER): Admitting: Physician Assistant

## 2024-05-11 ENCOUNTER — Ambulatory Visit: Payer: Self-pay | Admitting: Physician Assistant

## 2024-05-11 ENCOUNTER — Encounter: Payer: Self-pay | Admitting: Physician Assistant

## 2024-05-11 VITALS — BP 150/80 | HR 51 | Temp 97.8°F | Ht 65.5 in | Wt 173.7 lb

## 2024-05-11 DIAGNOSIS — J018 Other acute sinusitis: Secondary | ICD-10-CM

## 2024-05-11 DIAGNOSIS — E782 Mixed hyperlipidemia: Secondary | ICD-10-CM

## 2024-05-11 LAB — POC COVID19 BINAXNOW: SARS Coronavirus 2 Ag: NEGATIVE

## 2024-05-11 MED ORDER — AZITHROMYCIN 250 MG PO TABS: ORAL_TABLET | ORAL | 0 refills | Status: AC

## 2024-05-11 NOTE — Assessment & Plan Note (Signed)
 Acute sinusitis with otitis media Sinus congestion with ear fluid suggests sinus infection. Negative COVID test. Azithromycin  chosen for treatment. - Prescribed azithromycin  to [facility] in [place]. - Continue Mucinex. - Advised to report if symptoms do not improve or change. Orders:   POC COVID-19 BinaxNow   azithromycin  (ZITHROMAX ) 250 MG tablet; Take 2 tablets on day 1, then 1 tablet daily on days 2 through 5

## 2024-05-17 DIAGNOSIS — K50812 Crohn's disease of both small and large intestine with intestinal obstruction: Principal | ICD-10-CM

## 2024-05-27 DIAGNOSIS — K50812 Crohn's disease of both small and large intestine with intestinal obstruction: Principal | ICD-10-CM

## 2024-05-27 MED ORDER — TREMFYA PEN 200 MG/2 ML SUBCUTANEOUS PEN INJECTOR
SUBCUTANEOUS | 5 refills | 0.00000 days | Status: CP
Start: 2024-05-27 — End: 2024-05-27

## 2024-05-27 MED ORDER — TREMFYA PEN INDUCTION PACK (2 PENS) 200 MG/2 ML SUBCUT PEN INJECTOR
ORAL | 0 refills | 0.00000 days | Status: CP
Start: 2024-05-27 — End: ?

## 2024-05-27 MED ORDER — TREMFYA PEN 100 MG/ML SUBCUTANEOUS PEN INJECTOR
SUBCUTANEOUS | 2 refills | 0.00000 days | Status: CP
Start: 2024-05-27 — End: ?

## 2024-05-28 DIAGNOSIS — K50812 Crohn's disease of both small and large intestine with intestinal obstruction: Principal | ICD-10-CM

## 2024-07-05 ENCOUNTER — Ambulatory Visit: Payer: PRIVATE HEALTH INSURANCE | Admitting: Family Medicine

## 2024-08-04 ENCOUNTER — Ambulatory Visit (HOSPITAL_COMMUNITY): Admitting: Internal Medicine
